# Patient Record
Sex: Male | Born: 1955 | Race: White | Hispanic: No | Marital: Married | Smoking: Never smoker
Health system: Southern US, Community
[De-identification: ages and names within clinical notes are randomized; demographics above are authoritative.]

## PROBLEM LIST (undated history)

## (undated) DIAGNOSIS — I890 Lymphedema, not elsewhere classified: Secondary | ICD-10-CM

## (undated) DIAGNOSIS — R112 Nausea with vomiting, unspecified: Secondary | ICD-10-CM

## (undated) DIAGNOSIS — C4442 Squamous cell carcinoma of skin of scalp and neck: Secondary | ICD-10-CM

## (undated) DIAGNOSIS — M1990 Unspecified osteoarthritis, unspecified site: Secondary | ICD-10-CM

## (undated) DIAGNOSIS — I509 Heart failure, unspecified: Secondary | ICD-10-CM

## (undated) DIAGNOSIS — K219 Gastro-esophageal reflux disease without esophagitis: Secondary | ICD-10-CM

## (undated) DIAGNOSIS — I700 Atherosclerosis of aorta: Secondary | ICD-10-CM

## (undated) DIAGNOSIS — I10 Essential (primary) hypertension: Secondary | ICD-10-CM

## (undated) DIAGNOSIS — N182 Chronic kidney disease, stage 2 (mild): Secondary | ICD-10-CM

## (undated) DIAGNOSIS — T8859XA Other complications of anesthesia, initial encounter: Secondary | ICD-10-CM

## (undated) DIAGNOSIS — J387 Other diseases of larynx: Secondary | ICD-10-CM

## (undated) DIAGNOSIS — Z9889 Other specified postprocedural states: Secondary | ICD-10-CM

## (undated) DIAGNOSIS — E119 Type 2 diabetes mellitus without complications: Secondary | ICD-10-CM

## (undated) DIAGNOSIS — N289 Disorder of kidney and ureter, unspecified: Secondary | ICD-10-CM

## (undated) HISTORY — PX: KIDNEY SURGERY: SHX687

## (undated) HISTORY — PX: LITHOTRIPSY: SUR834

## (undated) HISTORY — PX: TONSILLECTOMY: SUR1361

## (undated) HISTORY — PX: KNEE SURGERY: SHX244

---

## 1978-01-10 DIAGNOSIS — A159 Respiratory tuberculosis unspecified: Secondary | ICD-10-CM

## 1978-01-10 HISTORY — DX: Respiratory tuberculosis unspecified: A15.9

## 1997-11-30 ENCOUNTER — Emergency Department (HOSPITAL_COMMUNITY): Admission: EM | Admit: 1997-11-30 | Discharge: 1997-11-30 | Payer: Self-pay | Admitting: Emergency Medicine

## 1998-01-05 ENCOUNTER — Emergency Department (HOSPITAL_COMMUNITY): Admission: EM | Admit: 1998-01-05 | Discharge: 1998-01-05 | Payer: Self-pay | Admitting: Emergency Medicine

## 1998-01-05 ENCOUNTER — Encounter: Payer: Self-pay | Admitting: Emergency Medicine

## 1998-05-04 ENCOUNTER — Ambulatory Visit (HOSPITAL_COMMUNITY): Admission: RE | Admit: 1998-05-04 | Discharge: 1998-05-04 | Payer: Self-pay | Admitting: Specialist

## 1998-05-04 ENCOUNTER — Encounter: Payer: Self-pay | Admitting: Specialist

## 1998-09-17 ENCOUNTER — Ambulatory Visit (HOSPITAL_COMMUNITY): Admission: RE | Admit: 1998-09-17 | Discharge: 1998-09-17 | Payer: Self-pay | Admitting: Specialist

## 2010-03-02 ENCOUNTER — Other Ambulatory Visit: Payer: Self-pay | Admitting: Family Medicine

## 2010-03-02 ENCOUNTER — Ambulatory Visit
Admission: RE | Admit: 2010-03-02 | Discharge: 2010-03-02 | Disposition: A | Discharge status: Home / Self Care | Payer: BC Managed Care – PPO | Source: Ambulatory Visit | Attending: Family Medicine | Admitting: Family Medicine

## 2010-03-02 ENCOUNTER — Inpatient Hospital Stay (HOSPITAL_COMMUNITY)
Admission: EM | Admit: 2010-03-02 | Discharge: 2010-03-03 | DRG: 584 | Disposition: A | Discharge status: Still In Hospital | Payer: BC Managed Care – PPO | Attending: Internal Medicine | Admitting: Internal Medicine

## 2010-03-02 ENCOUNTER — Emergency Department (HOSPITAL_COMMUNITY): Payer: BC Managed Care – PPO

## 2010-03-02 DIAGNOSIS — E119 Type 2 diabetes mellitus without complications: Secondary | ICD-10-CM | POA: Diagnosis present

## 2010-03-02 DIAGNOSIS — N179 Acute kidney failure, unspecified: Secondary | ICD-10-CM | POA: Diagnosis present

## 2010-03-02 DIAGNOSIS — A419 Sepsis, unspecified organism: Principal | ICD-10-CM | POA: Diagnosis present

## 2010-03-02 DIAGNOSIS — N12 Tubulo-interstitial nephritis, not specified as acute or chronic: Secondary | ICD-10-CM | POA: Diagnosis present

## 2010-03-02 DIAGNOSIS — N1330 Unspecified hydronephrosis: Secondary | ICD-10-CM | POA: Diagnosis present

## 2010-03-02 MED ORDER — IOHEXOL 300 MG/ML  SOLN
125.0000 mL | Freq: Once | INTRAMUSCULAR | Status: AC | PRN
  Administered 2010-03-02: 125 mL via INTRAVENOUS

## 2010-03-03 ENCOUNTER — Inpatient Hospital Stay (HOSPITAL_COMMUNITY)
Admission: AD | Admit: 2010-03-03 | Discharge: 2010-03-07 | DRG: 584 | Disposition: A | Discharge status: Home / Self Care | Payer: BC Managed Care – PPO | Source: Other Acute Inpatient Hospital | Attending: Internal Medicine | Admitting: Internal Medicine

## 2010-03-03 DIAGNOSIS — R0902 Hypoxemia: Secondary | ICD-10-CM | POA: Diagnosis present

## 2010-03-03 DIAGNOSIS — N201 Calculus of ureter: Secondary | ICD-10-CM | POA: Diagnosis present

## 2010-03-03 DIAGNOSIS — A4151 Sepsis due to Escherichia coli [E. coli]: Principal | ICD-10-CM | POA: Diagnosis present

## 2010-03-03 DIAGNOSIS — IMO0001 Reserved for inherently not codable concepts without codable children: Secondary | ICD-10-CM | POA: Diagnosis present

## 2010-03-03 DIAGNOSIS — N10 Acute tubulo-interstitial nephritis: Secondary | ICD-10-CM | POA: Diagnosis present

## 2010-03-03 DIAGNOSIS — R7989 Other specified abnormal findings of blood chemistry: Secondary | ICD-10-CM | POA: Diagnosis present

## 2010-03-03 DIAGNOSIS — N179 Acute kidney failure, unspecified: Secondary | ICD-10-CM | POA: Diagnosis present

## 2010-03-03 DIAGNOSIS — D6959 Other secondary thrombocytopenia: Secondary | ICD-10-CM | POA: Diagnosis present

## 2010-03-03 DIAGNOSIS — A419 Sepsis, unspecified organism: Secondary | ICD-10-CM | POA: Diagnosis present

## 2010-03-03 DIAGNOSIS — I10 Essential (primary) hypertension: Secondary | ICD-10-CM | POA: Diagnosis present

## 2010-03-03 DIAGNOSIS — N1330 Unspecified hydronephrosis: Secondary | ICD-10-CM | POA: Diagnosis present

## 2010-03-03 LAB — CBC
HCT: 40.2 % (ref 39.0–52.0)
Hemoglobin: 14.6 g/dL (ref 13.0–17.0)
Hemoglobin: 13.6 g/dL (ref 13.0–17.0)
MCH: 30 pg (ref 26.0–34.0)
MCH: 30.4 pg (ref 26.0–34.0)
MCV: 88.5 fL (ref 78.0–100.0)
MCV: 89.9 fL (ref 78.0–100.0)
Platelets: 102 10*3/uL — ABNORMAL LOW (ref 150–400)
RBC: 4.86 MIL/uL (ref 4.22–5.81)
RBC: 4.47 MIL/uL (ref 4.22–5.81)
WBC: 3.9 10*3/uL — ABNORMAL LOW (ref 4.0–10.5)
WBC: 21.1 10*3/uL — ABNORMAL HIGH (ref 4.0–10.5)

## 2010-03-03 LAB — COMPREHENSIVE METABOLIC PANEL
ALT: 49 U/L (ref 0–53)
Alkaline Phosphatase: 69 U/L (ref 39–117)
CO2: 20 mEq/L (ref 19–32)
Chloride: 101 mEq/L (ref 96–112)
GFR calc non Af Amer: 40 mL/min — ABNORMAL LOW (ref >60)
Glucose, Bld: 239 mg/dL — ABNORMAL HIGH (ref 70–99)
Potassium: 3.9 mEq/L (ref 3.5–5.1)
Sodium: 134 mEq/L — ABNORMAL LOW (ref 135–145)
Total Bilirubin: 1.4 mg/dL — ABNORMAL HIGH (ref 0.3–1.2)

## 2010-03-03 LAB — URINALYSIS, ROUTINE W REFLEX MICROSCOPIC
Bilirubin Urine: NEGATIVE
Ketones, ur: 15 mg/dL — AB
Specific Gravity, Urine: 1.041 — ABNORMAL HIGH (ref 1.005–1.030)
Urine Glucose, Fasting: 100 mg/dL — AB
pH: 5.5 (ref 5.0–8.0)

## 2010-03-03 LAB — GLUCOSE, CAPILLARY
Glucose-Capillary: 193 mg/dL — ABNORMAL HIGH (ref 70–99)
Glucose-Capillary: 166 mg/dL — ABNORMAL HIGH (ref 70–99)
Glucose-Capillary: 192 mg/dL — ABNORMAL HIGH (ref 70–99)

## 2010-03-03 LAB — BASIC METABOLIC PANEL
BUN: 27 mg/dL — ABNORMAL HIGH (ref 6–23)
CO2: 19 mEq/L (ref 19–32)
Chloride: 102 mEq/L (ref 96–112)
Creatinine, Ser: 2.56 mg/dL — ABNORMAL HIGH (ref 0.4–1.5)
Glucose, Bld: 299 mg/dL — ABNORMAL HIGH (ref 70–99)
Potassium: 4.3 mEq/L (ref 3.5–5.1)

## 2010-03-03 LAB — DIFFERENTIAL
Basophils Relative: 0 % (ref 0–1)
Eosinophils Absolute: 0 10*3/uL (ref 0.0–0.7)
Eosinophils Relative: 0 % (ref 0–5)
Lymphocytes Relative: 4 % — ABNORMAL LOW (ref 12–46)
Lymphs Abs: 0.5 10*3/uL — ABNORMAL LOW (ref 0.7–4.0)
Monocytes Relative: 0 % — ABNORMAL LOW (ref 3–12)
Neutro Abs: 3.3 10*3/uL (ref 1.7–7.7)
Neutro Abs: 19 10*3/uL — ABNORMAL HIGH (ref 1.7–7.7)
Neutrophils Relative %: 85 % — ABNORMAL HIGH (ref 43–77)
Neutrophils Relative %: 90 % — ABNORMAL HIGH (ref 43–77)

## 2010-03-03 LAB — URINE MICROSCOPIC-ADD ON

## 2010-03-03 LAB — TYPE AND SCREEN: ABO/RH(D): B POS

## 2010-03-03 LAB — ABO/RH: ABO/RH(D): B POS

## 2010-03-04 LAB — URINE CULTURE: Culture  Setup Time: 201202210132

## 2010-03-04 LAB — GLUCOSE, CAPILLARY
Glucose-Capillary: 148 mg/dL — ABNORMAL HIGH (ref 70–99)
Glucose-Capillary: 221 mg/dL — ABNORMAL HIGH (ref 70–99)
Glucose-Capillary: 289 mg/dL — ABNORMAL HIGH (ref 70–99)

## 2010-03-04 LAB — DIFFERENTIAL
Basophils Relative: 0 % (ref 0–1)
Eosinophils Relative: 1 % (ref 0–5)
Monocytes Relative: 7 % (ref 3–12)
Neutrophils Relative %: 84 % — ABNORMAL HIGH (ref 43–77)
WBC Morphology: INCREASED

## 2010-03-04 LAB — COMPREHENSIVE METABOLIC PANEL
Alkaline Phosphatase: 59 U/L (ref 39–117)
BUN: 49 mg/dL — ABNORMAL HIGH (ref 6–23)
CO2: 24 mEq/L (ref 19–32)
Chloride: 99 mEq/L (ref 96–112)
Creatinine, Ser: 3.87 mg/dL — ABNORMAL HIGH (ref 0.4–1.5)
GFR calc non Af Amer: 16 mL/min — ABNORMAL LOW (ref >60)
Glucose, Bld: 304 mg/dL — ABNORMAL HIGH (ref 70–99)
Potassium: 4.4 mEq/L (ref 3.5–5.1)
Total Bilirubin: 2.4 mg/dL — ABNORMAL HIGH (ref 0.3–1.2)

## 2010-03-04 LAB — CBC
HCT: 36.2 % — ABNORMAL LOW (ref 39.0–52.0)
MCH: 30.1 pg (ref 26.0–34.0)
MCV: 92.3 fL (ref 78.0–100.0)
RBC: 3.92 MIL/uL — ABNORMAL LOW (ref 4.22–5.81)
WBC: 15.8 10*3/uL — ABNORMAL HIGH (ref 4.0–10.5)

## 2010-03-05 ENCOUNTER — Inpatient Hospital Stay (HOSPITAL_COMMUNITY): Payer: BC Managed Care – PPO

## 2010-03-05 LAB — COMPREHENSIVE METABOLIC PANEL
ALT: 44 U/L (ref 0–53)
AST: 39 U/L — ABNORMAL HIGH (ref 0–37)
Alkaline Phosphatase: 84 U/L (ref 39–117)
CO2: 23 mEq/L (ref 19–32)
Calcium: 7.6 mg/dL — ABNORMAL LOW (ref 8.4–10.5)
Chloride: 103 mEq/L (ref 96–112)
GFR calc Af Amer: 34 mL/min — ABNORMAL LOW (ref >60)
GFR calc non Af Amer: 28 mL/min — ABNORMAL LOW (ref >60)
Glucose, Bld: 244 mg/dL — ABNORMAL HIGH (ref 70–99)
Sodium: 135 mEq/L (ref 135–145)
Total Bilirubin: 1.8 mg/dL — ABNORMAL HIGH (ref 0.3–1.2)

## 2010-03-05 LAB — DIFFERENTIAL
Basophils Absolute: 0 10*3/uL (ref 0.0–0.1)
Basophils Relative: 0 % (ref 0–1)
Eosinophils Absolute: 0 10*3/uL (ref 0.0–0.7)
Lymphocytes Relative: 6 % — ABNORMAL LOW (ref 12–46)
Monocytes Relative: 6 % (ref 3–12)
Neutro Abs: 16.8 10*3/uL — ABNORMAL HIGH (ref 1.7–7.7)
Neutrophils Relative %: 88 % — ABNORMAL HIGH (ref 43–77)

## 2010-03-05 LAB — GLUCOSE, CAPILLARY
Glucose-Capillary: 244 mg/dL — ABNORMAL HIGH (ref 70–99)
Glucose-Capillary: 221 mg/dL — ABNORMAL HIGH (ref 70–99)
Glucose-Capillary: 209 mg/dL — ABNORMAL HIGH (ref 70–99)

## 2010-03-05 LAB — CBC
HCT: 37.1 % — ABNORMAL LOW (ref 39.0–52.0)
Hemoglobin: 12 g/dL — ABNORMAL LOW (ref 13.0–17.0)
MCHC: 32.3 g/dL (ref 30.0–36.0)
RBC: 4.01 MIL/uL — ABNORMAL LOW (ref 4.22–5.81)

## 2010-03-05 LAB — HEMOGLOBIN A1C
Hgb A1c MFr Bld: 8.6 % — ABNORMAL HIGH (ref <5.7)
Mean Plasma Glucose: 200 mg/dL — ABNORMAL HIGH (ref <117)

## 2010-03-06 ENCOUNTER — Inpatient Hospital Stay (HOSPITAL_COMMUNITY): Payer: BC Managed Care – PPO

## 2010-03-06 LAB — GLUCOSE, CAPILLARY
Glucose-Capillary: 206 mg/dL — ABNORMAL HIGH (ref 70–99)
Glucose-Capillary: 173 mg/dL — ABNORMAL HIGH (ref 70–99)
Glucose-Capillary: 178 mg/dL — ABNORMAL HIGH (ref 70–99)

## 2010-03-06 LAB — RENAL FUNCTION PANEL
BUN: 31 mg/dL — ABNORMAL HIGH (ref 6–23)
CO2: 24 mEq/L (ref 19–32)
Calcium: 8 mg/dL — ABNORMAL LOW (ref 8.4–10.5)
Chloride: 106 mEq/L (ref 96–112)
Creatinine, Ser: 1.26 mg/dL (ref 0.4–1.5)

## 2010-03-06 LAB — COMPREHENSIVE METABOLIC PANEL
ALT: 39 U/L (ref 0–53)
Albumin: 2.4 g/dL — ABNORMAL LOW (ref 3.5–5.2)
Alkaline Phosphatase: 96 U/L (ref 39–117)
Chloride: 105 mEq/L (ref 96–112)
Glucose, Bld: 228 mg/dL — ABNORMAL HIGH (ref 70–99)
Potassium: 4 mEq/L (ref 3.5–5.1)
Sodium: 137 mEq/L (ref 135–145)
Total Bilirubin: 1.3 mg/dL — ABNORMAL HIGH (ref 0.3–1.2)
Total Protein: 6.1 g/dL (ref 6.0–8.3)

## 2010-03-06 LAB — FERRITIN: Ferritin: 376 ng/mL — ABNORMAL HIGH (ref 22–322)

## 2010-03-06 LAB — CBC
HCT: 36.2 % — ABNORMAL LOW (ref 39.0–52.0)
Hemoglobin: 11.7 g/dL — ABNORMAL LOW (ref 13.0–17.0)
MCV: 91.9 fL (ref 78.0–100.0)
RBC: 3.94 MIL/uL — ABNORMAL LOW (ref 4.22–5.81)
RDW: 14 % (ref 11.5–15.5)
WBC: 16.2 10*3/uL — ABNORMAL HIGH (ref 4.0–10.5)

## 2010-03-06 LAB — MAGNESIUM: Magnesium: 2.6 mg/dL — ABNORMAL HIGH (ref 1.5–2.5)

## 2010-03-06 LAB — VITAMIN B12: Vitamin B-12: 1656 pg/mL — ABNORMAL HIGH (ref 211–911)

## 2010-03-06 LAB — FOLATE: Folate: >20 ng/mL

## 2010-03-07 ENCOUNTER — Inpatient Hospital Stay (HOSPITAL_COMMUNITY): Payer: BC Managed Care – PPO

## 2010-03-07 LAB — GLUCOSE, CAPILLARY
Glucose-Capillary: 201 mg/dL — ABNORMAL HIGH (ref 70–99)
Glucose-Capillary: 156 mg/dL — ABNORMAL HIGH (ref 70–99)

## 2010-03-07 LAB — COMPREHENSIVE METABOLIC PANEL
AST: 23 U/L (ref 0–37)
Albumin: 2.2 g/dL — ABNORMAL LOW (ref 3.5–5.2)
BUN: 22 mg/dL (ref 6–23)
Calcium: 8.4 mg/dL (ref 8.4–10.5)
Chloride: 104 mEq/L (ref 96–112)
Creatinine, Ser: 0.91 mg/dL (ref 0.4–1.5)
GFR calc Af Amer: >60 mL/min (ref >60)
Total Bilirubin: 1.1 mg/dL (ref 0.3–1.2)

## 2010-03-07 LAB — CBC
MCH: 29.9 pg (ref 26.0–34.0)
MCHC: 32.3 g/dL (ref 30.0–36.0)
MCV: 92.7 fL (ref 78.0–100.0)
Platelets: 133 10*3/uL — ABNORMAL LOW (ref 150–400)
RDW: 13.9 % (ref 11.5–15.5)

## 2010-03-07 LAB — PHOSPHORUS: Phosphorus: 2.4 mg/dL (ref 2.3–4.6)

## 2010-03-08 NOTE — Op Note (Signed)
  [name], Randall [name]              ACCOUNT NO.:  [id number]  MEDICAL RECORD NO.:  [id number]           PATIENT TYPE:  I  LOCATION:  1415                         FACILITY:  [facility]  PHYSICIAN:  Cerys Winget I. Olamide Carattini, M.D.DATE OF BIRTH:  [birth year]  DATE OF PROCEDURE: DATE OF DISCHARGE:                              OPERATIVE REPORT   PREOPERATIVE DIAGNOSIS:  Right ureteropelvic junction stone with obstruction and pyelonephritis.  POSTOPERATIVE DIAGNOSIS:  Right ureteropelvic junction stone with obstruction and pyelonephritis.  OPERATION:  Cystourethroscopy, right retrograde pyelogram with interpretation, right double-J stent.  SURGEON:  Nyiah Pianka I. Patsi Sears, M.D.  ANESTHESIA:  General LMA.  OPERATION:  After appropriate preanesthesia, the patient was brought to the operating room, placed on the operating room table in dorsal supine position where general LMA anesthesia was induced.  He was then re- placed in dorsal lithotomy position where the pubis was prepped with Betadine solution, draped in usual fashion.  BRIEF HISTORY:  This 55 year old male was admitted to [facility] Hospital[facility] and transferred to [facility] with pyelonephritis, right mild to moderate hydronephrosis with right 9 x 7 x 9 mm right UP junction stone with obstruction.  He also had acute renal failure.  He has been offered cystoscopy and double-J stent.  PROCEDURE:  Cystourethroscopy was accomplished and shows a normal- appearing bladder.  Right ureteral orifice identified, retrograde pyelogram was performed which shows a normal-appearing right ureter. The stone in the kidney was poorly identified on retrograde pyelography. Wire was coiled in the kidney, and a right 6-French x 24 cm double-J stent was placed without difficulty and in good position.  The patient was awakened, taken to recovery room in good condition.     [name] I. Patsi Sears,  M.D.     SIT/MEDQ  D:  03/03/2010  T:  03/04/2010  Job:  [id number]  Electronically Signed by Jethro Bolus M.D. on 03/08/2010 09:35:15 AM

## 2010-03-10 LAB — CULTURE, BLOOD (ROUTINE X 2)
Culture  Setup Time: 201202232152
Culture: NO GROWTH

## 2010-03-15 ENCOUNTER — Ambulatory Visit (HOSPITAL_COMMUNITY): Payer: BC Managed Care – PPO

## 2010-03-15 ENCOUNTER — Ambulatory Visit (HOSPITAL_COMMUNITY)
Admission: RE | Admit: 2010-03-15 | Discharge: 2010-03-15 | Disposition: A | Discharge status: Home / Self Care | Payer: BC Managed Care – PPO | Source: Ambulatory Visit | Attending: Urology | Admitting: Urology

## 2010-03-15 DIAGNOSIS — N1330 Unspecified hydronephrosis: Secondary | ICD-10-CM | POA: Insufficient documentation

## 2010-03-15 DIAGNOSIS — N200 Calculus of kidney: Secondary | ICD-10-CM | POA: Insufficient documentation

## 2010-03-15 NOTE — H&P (Signed)
Randall [name], Randall [name]              ACCOUNT NO.:  [id number]  MEDICAL RECORD NO.:  [id number]           PATIENT TYPE:  I  LOCATION:  1415                         FACILITY:  [facility]  PHYSICIAN:  Della Goo, M.D. DATE OF BIRTH:  12-25-1955  DATE OF ADMISSION:  03/03/2010 DATE OF DISCHARGE:  03/02/2010                             HISTORY & PHYSICAL   PRIMARY CARE PHYSICIAN:  Gloriajean Dell. Andrey Campanile, MD  CHIEF COMPLAINT:  Flank pain.  HISTORY OF PRESENT ILLNESS:  This is a 55 year old male who presents to the emergency department with complaints of severe right-sided flank pain, which started about 7:30 p.m.  He states he began to have fevers as well along with nausea and vomiting off and on.  The patient also states that he was passing tea-colored urine.  He denies having any dysuria.  The patient denies having any previous similar episodes to this.  The patient was seen by his primary care physician and was sent for an outpatient CT scan to evaluate for a possible kidney stone, results of which returned positive for a kidney stone.  The patient presented to the emergency department secondary to his diagnosis along with his increased pain.  PAST MEDICAL HISTORY:  Significant for hypertension.  PAST SURGICAL HISTORY:  History of bilateral knee arthroscopic surgeries.  MEDICATIONS:  Lisinopril, hydrochlorothiazide, promethazine VC and hydrocodone/APAP.  ALLERGIES:  NORGESIC.  SOCIAL HISTORY:  The patient is a nonsmoker.  He does report drinking one glass of wine every 2 weeks.  FAMILY HISTORY:  Negative for kidney stones, positive coronary artery disease in his mother, positive diabetes in his father.  No hypertension in his family, and his father had some type of cancer.  REVIEW OF SYSTEMS:  Pertinent as mentioned above.  PHYSICAL EXAMINATION FINDINGS:  GENERAL:  This is a 55 year old obese, well-developed Caucasian male who is in discomfort but no acute distress. VITAL  SIGNS:  Temperature 101.4, blood pressure 141/69, heart rate initially 133, now 114.  Respirations 24, now 20, O2 sats 92-96%. HEENT:  Normocephalic, atraumatic.  Pupils equally round, reactive to light.  Extraocular movements are intact.  Funduscopic benign.  There is no scleral icterus.  There is no conjunctival erythema.  Nares are patent bilaterally.  Oropharynx is clear. NECK:  Supple.  Full range of motion.  No thyromegaly, adenopathy or jugular venous distention.  CARDIOVASCULAR:  Tachycardic rate and rhythm.  No murmurs, gallops or rubs appreciated. LUNGS:  Clear to auscultation bilaterally.  No rales, rhonchi or wheezes. ABDOMEN:  Positive bowel sounds, soft, nontender, nondistended.  No hepatosplenomegaly. BACK:  Positive costovertebral angle tenderness on the right.  No spinous process tenderness.  No palpable muscular spasms. NEUROLOGIC:  Nonfocal.  LABORATORY STUDIES:  White blood cell count 3.9, hemoglobin 14.6, hematocrit 43.0, MCV 88.5, platelets 116, neutrophils 85%, lymphocytes 14%.  Sodium 134, potassium 3.9, chloride 101, CO2 of 20, BUN 19, creatinine 1.78 and glucose 239.  Urinalysis reveals large urine hemoglobin, large leukocyte esterase, urine total protein 100, urine microscopic rare epithelials, urine white blood cells 21-50, urine red blood cells 11-20, and many bacteria.  Chest x-ray reveals an elevated left hemidiaphragm,  some patchy segmental atelectasis in the left lung base.  ASSESSMENT:  This is a 55 year old male being admitted with: 1. Pyelonephritis. 2. Nephrolithiasis. 3. Right flank pain secondary to pyelonephritis and nephrolithiasis. 4. Febrile illness secondary to pyelonephritis. 5. Sinus tachycardia. 6. Hypertension.  PLAN:  The patient will be admitted.  He has been placed on antibiotic therapy of IV Rocephin at this time.  A urine culture and sensitivity has already been sent.  The patient will be placed on IV fluids and pain control  medication as needed.  And in the a.m., a Urology consultation will be placed.  The patient's regular medications will be further reviewed and reconciled and further workup we will ensue pending results, the patient's clinical course, and studies.     Della Goo, M.D.     HJ/MEDQ  D:  03/04/2010  T:  03/04/2010  Job:  [id number]  cc:   Gloriajean Dell. Andrey Campanile, M.D.  Electronically Signed by Della Goo M.D. on 03/15/2010 08:24:04 PM

## 2010-03-18 NOTE — H&P (Signed)
Randall [name], Randall [name]              ACCOUNT NO.:  [id number]  MEDICAL RECORD NO.:  [id number]           PATIENT TYPE:  I  LOCATION:  6713                         FACILITY:  [facility]  PHYSICIAN:  Brendia Sacks, MD    DATE OF BIRTH:  1955/04/02  DATE OF ADMISSION:  03/02/2010 DATE OF DISCHARGE:                             HISTORY & PHYSICAL   CHIEF COMPLAINT:  Fever.  HISTORY OF PRESENT ILLNESS:  This is a 55 year old man, who presented to the emergency room last evening with acute onset of right flank pain, fever, rigors, nausea, and vomiting.  He was found to be febrile and tachycardic in the emergency room.  CT scan obtained and did show a right UVJ stone with mild-to-moderate hydronephrosis.  Apparently Urology consultation was contemplated at that point, but not placed. The patient was evaluated by [facility] and admitted to the medical floor.  Orders are present.  The patient reports that his symptoms began acutely yesterday.  He has never had kidney stones before.  His symptoms are as described above. He had nausea and vomiting yesterday, he has none today and his pain is controlled today with morphine.  He feels overall better, but still does not feel his usual self.  REVIEW OF SYSTEMS:  Negative for changes to his vision, sore throat, rash, chest pain.  He did have some shortness of breath yesterday, but that has resolved.  No dysuria, no hematuria that he is aware of.  PAST MEDICAL HISTORY:  Hypertension.  PAST SURGICAL HISTORY:  Bilateral knee surgery.  ALLERGIES:  NORGESIC.  MEDICATIONS:  Include, 1. Multivitamin p.o. daily. 2. Hydrocodone/acetaminophen 5/500 p.o. q.6 h as needed. 3. Promethazine 25 mg every 6 hours as needed. 4. Lisinopril/hydrochlorothiazide 20/12.5 p.o. daily. 5. Glucosamine over-the-counter p.o. daily. 6. Aspirin 81 mg p.o. daily. 7. Fish oil p.o. daily.  FAMILY HISTORY:  Mother had heart disease.  Father had cancer  and diabetes.  SOCIAL HISTORY:  He is a nonsmoker, drinks minimal alcohol, perhaps a glass of wine every 2 weeks.  PHYSICAL EXAMINATION:  VITALS:  On the floor, temperature is 98.8, pulse 85, respirations 20, blood pressure 154/76, sat 96% on 2 liters. Initial vitals in the emergency room with temperature 101.4, blood pressure 141/69, pulse 133, respirations 24, sat 92%. GENERAL:  The patient appears ill, but nontoxic. HEENT:  Head appears normal.  Eyes, sclerae clear.  Pupils are equal, round, and reactive to light.  Lids, irises, and conjunctive appear normal.  ENT, hearing is grossly normal.  Lips, gums, and tongue appear unremarkable. NECK:  Supple.  No lymphadenopathy or masses.  No thyromegaly. CHEST:  Clear to auscultation bilaterally.  No wheezes, rales, or rhonchi.  There is normal respiratory effort. CARDIOVASCULAR:  Heart sounds are distant, but regular and tachycardic. No murmur, rub, or gallop.  No lower extremity edema. ABDOMEN:  Soft, nontender, nondistended.  He has been medicated and currently has no right flank pain to gentle palpation. SKIN:  Normal without rash or indurations and is nontender to palpation.  LABORATORY DATA:  Pertinent laboratory studies; white blood cell count now at 21.1 from 3.9 yesterday evening, platelet  count is 102.  Blood sugars ranging 200-300.  Basic metabolic panel also notable for creatinine now 2.56 in comparison to 1.78 yesterday.  Urinalysis is grossly positive with culture pending.  IMAGING:  Chest x-ray on March 03, 2010, atelectasis.  CT of the abdomen and pelvis, mild-to-moderate right hydronephrosis caused by a 9 x 7 x 9 mm right UPJ calculus.  Diffuse fatty infiltration of the liver.  ASSESSMENT/PLAN:  This is a 55 year old man presents with sepsis. 1. Sepsis secondary to pyelonephritis and right mild-to-moderate     hydronephrosis caused by ureteropelvic junction calculus.  We will     treat with IV fluids.  We will  treat with Rocephin.  He appears to     be clinically nontoxic at this point. 2. Pyelonephritis with right mild-to-moderate hydronephrosis secondary     to an ureteropelvic junction calculus.  I have discussed the case     with Dr. Patsi Sears, who will see the patient in consultation.  I     have made the patient n.p.o. and discontinued his pharmacologic VTE     prophylaxis. 3. Acute renal failure.  We will check a basic metabolic panel in the     morning and continue IV fluids at this point and hopefully removal     of stone will improve his renal function rapidly.  We will obtain a     renal ultrasound if his kidney function has not improved in the     morning.  His electrolyte panel appears to be stable at this point.     His acute renal failure is likely complicated by his diuretic and     ACE inhibitor therapy, which has been discontinued. 4. Elevated AST and total bilirubin.  Etiology unclear, but the     patient does have diffuse fatty infiltration on CT.  This is the     likely etiology. 5. Diabetes mellitus type 2.  The patient has definite hyperglycemia.     We will check hemoglobin A1c and place him on sliding scale     insulin.  I have discussed the case with the patient and his wife at bedside with Dr. Patsi Sears.  No H and P was available for review and I did personally reviewed the patient's studies, imaging, and discussed the above history with the patient.     Brendia Sacks, MD     DG/MEDQ  D:  03/03/2010  T:  03/03/2010  Job:  [id number]  Electronically Signed by Brendia Sacks  on 03/17/2010 09:13:43 PM

## 2010-03-24 NOTE — Consult Note (Signed)
NAMEJERRITT, CARDOZA              ACCOUNT NO.:  [id number]  MEDICAL RECORD NO.:  [id number]           PATIENT TYPE:  I  LOCATION:  1415                         FACILITY:  [facility]  PHYSICIAN:  Angeligue Bowne B. Eliott Nine, M.D.DATE OF BIRTH:  12-Mar-1955  DATE OF CONSULTATION: DATE OF DISCHARGE:  03/03/2010                                CONSULTATION   REASON FOR CONSULTATION:  Acute kidney injury.  HISTORY:  This is a 55 year old white male who has a background history of hypertension on lisinopril and diabetes type 2 that has, to date, not required any therapy.  He presented to his primary care office ([facility]) with flank pain, fevers, chills and nausea.  His primary physician is Dr. Andrey Campanile, who was not in at the time, and he was seen by one of Dr. Tawana Scale partners and sent to [facility] for a CT scan of the abdomen and pelvis.  By report, his creatinine at [facility] was 1.0.  He had a CT scanwith contrast that showed moderate right hydronephrosis with a right UVJ obstructing stone.  He and his wife went home and then were called back to come to the Emergency Department because of the CT findings.  He was complaining at that time of flank pain, fevers, chills and his wife reported that he had had some confusion and altered mental status.  His urinalysis showed bacteriuria and pyuria as well as hematuria. Creatinine in the Emergency Department on the same day was 1.78.  His urine culture subsequently grew greater than 100,000 Escherichia coli and he has been receiving IV fluids and Rocephin.  He was evaluated by Urology and subsequently transferred to [facility], where he underwent cystourethroscopy and right retrograde pyelography with placement of a double-J stent.  The operative report states that "the stone was poorly identified".  He received Toradol postprocedure intravenously for pain.  As previously mentioned by report, his  creatinine at [facility] prior to the CT on March 02, 2010, was 1.0, but there is no hard copy.  On March 02, 2010, in the [facility] ED several hours later, his creatinine was 1.78, on March 03, 2010, was 2.56 and today was up to 3.87.  His urine output today is recorded as 500 cc with a net input and output of 1690 in and 500 out.  His urine had been bloody prior to the urologic procedure and has been very dark, almost black in color, since.  His blood pressures have been soft around 90/60 and 100/68 despite fluids and holding his lisinopril.  He remains nauseous, but the chills and back pain have gotten better.  He did tolerate some food at lunchtime.  PAST MEDICAL HISTORY: 1. Hypertension, chronically on lisinopril/HCT. 2. Diabetes type 2, on no medications until this admission. 3. Remote history of arthroscopic surgery to both knees. 4. Fatty liver noted on CT scan on March 03, 2010.  MEDICATIONS PRIOR TO ADMISSION:  Include lisinopril/HCT, promethazine and hydrocodone APAP.  MEDICATIONS IN THE HOSPITAL:  Have included Rocephin 1 gram every 24 hours, Lovenox 40 mg a day, NovoLog insulin, Toradol IV every 6 hours (this  has been discontinued) and p.r.n. medicines which include Tylenol, Dilaudid, Percocet and Urised.  FAMILY HISTORY:  Positive for coronary disease and diabetes and his mother had Parkinson's disease.  Father died of cancer and there is a sibling with liver disease.  There is no history of stones or kidney disease in the family.  SOCIAL HISTORY:  [place].  He works in Administrator.  He occasionally drinks a glass of wine and does not use tobacco.  REVIEW OF SYSTEMS:  Currently positive for nausea and he states that if he lies very still he feels better.  The right side pain is persistent, but better.  His urine has been dark since his procedure and was tea- colored and bloody prior to  the procedure.  He is not having any dysuria.  He denies headache.  He had chills and fevers prior to admission with some disorientation at that time, but his temperature curve has come down and he has had no further episodes of this.  There have been no skin rashes.  He had never passed a stone prior to having this current stone identified.  PHYSICAL EXAMINATION:  VITAL SIGNS:  He is afebrile at this time.  Blood pressure is 100/68, O2 saturation is 94% on 2 liters. GENERAL APPEARANCE:  He is ill-appearing but very nice and soft-spoken. NECK:  He has no neck vein distention, although the neck veins are somewhat difficult to see because of his fairly large body habitus. RESPIRATORY:  His lungs are clear, but effort is diminished because when he takes a deep breath it hurts. CARDIAC:  Rhythm is regular.  S1-S2.  No audible S3.  No pericardial rub. ABDOMEN:  There is some tenderness in the right upper quadrant and right lower abdomen with some degree of flank tenderness with rocking.  The liver edge is at the right costal margin.  There are no masses.  The bladder is not palpable or percussible. EXTREMITIES:  Without edema.  LABORATORY DATA:  Sodium 131, potassium 4.4, chloride 94, CO2 24, BUN 49, creatinine 3.87, bilirubin 2.4, SGOT 45, SGPT 42, albumin 2.7, calcium 7.5.  Urinalysis on admission specific gravity 1.041 (done after the CT), large leukocyte esterase, 100 mg percent protein, 21-50 white cells and 11-20 red cells.  Urine culture is positive for greater than 100,000 colonies of Escherichia coli.  WBC is 15,800, down from 21,100. Hemoglobin 11.8, platelets 82,000, down from 116,000.  Hemoglobin A1c is pending.  IMPRESSION:  A 55 year old white male with history of hypertension and previously diet-controlled diabetes type 2 who presents with acute kidney injury secondary to a number of contributing factors including Escherichia coli pyelonephritis, an obstructing right  kidney stone, intravenous Toradol as well as preadmission chronic ACE inhibitor and nonsteroidal anti-inflammatory use, intravenous contrast with CT scan and relative hypotension.  At the present time his volume status, potassium and acid-base status are all okay and he is nonoliguric. However, his creatinine continues to rise.  RECOMMENDATIONS:  I agree with increasing his IV fluids to 150 cc an hour, discontinuation of Toradol and holding his lisinopril.  Would monitor intake and output carefully.  The patient is aware that he could possibly require dialysis, but hopefully with volume resuscitation and treatment of infection as well as elimination of these other nephrotoxic insults, creatinine will plateau.  Even though he does have a double-J stent in place with the stone that was poorly seen, would repeat an imaging study tomorrow (ultrasound  would be fine) to make sure that the hydronephrosis on the right has resolved.  We will follow up in the morning.  Thank you for asking [name] to see him.     [facility] Salvia Eliott Nine, M.D.     CBD/MEDQ  D:  03/04/2010  T:  03/05/2010  Job:  [id number]  cc:   Marcellus Scott, MD  Electronically Signed by Camille Bal M.D. on 03/24/2010 01:56:54 PM

## 2010-03-29 NOTE — Discharge Summary (Signed)
NAMEHEATON, SARIN              ACCOUNT NO.:  [id number]  MEDICAL RECORD NO.:  [id number]           PATIENT TYPE:  I  LOCATION:  1415                         FACILITY:  [facility]  PHYSICIAN:  Marcellus Scott, MD     DATE OF BIRTH:  04/25/1955  DATE OF ADMISSION:  03/03/2010 DATE OF DISCHARGE:  03/07/2010                              DISCHARGE SUMMARY   PRIMARY CARE PHYSICIAN:  Gloriajean Dell. Andrey Campanile, M.D.  UROLOGISTLynelle Smoke I. Patsi Sears, M.D.  DISCHARGE DIAGNOSES: 1. Acute pyelonephritis. 2. Right-sided hydronephrosis secondary to obstructing ureteropelvic     junction stone, status post double-J stent. 3. Sepsis secondary to acute pyelonephritis. 4. Acute renal failure, multifactorial, resolved. 5. Uncontrolled type 2 diabetes mellitus. 6. Thrombocytopenia, secondary to sepsis, improved. 7. Elevated liver function tests, secondary to sepsis, improved. 8. Hypertension. 9. Transient hypoxemia, resolved.  DISCHARGE MEDICATIONS: 1. Amlodipine 10 mg p.o. daily. 2. NovoLog 4 units subcutaneously t.i.d. with meals. 3. Lantus 15 units subcutaneously q.h.s. 4. Levaquin 750 mg p.o. daily. 5. Aspirin 81 mg p.o. daily. 6. Fish oil 1 tablet p.o. daily. 7. Hydrocodone/acetaminophen, 5/500 mg, 1 tablet p.o. q.6 hourly     p.r.n. for pain. 8. Multivitamins one p.o. daily. 9. Promethazine 25 mg p.o. q.6 hourly p.r.n. for nausea.  DISCONTINUED MEDICATIONS:  Glucosamine. Lisinopril/hydrochlorothiazide.  PROCEDURES:  Cystourethroscopy, right retrograde pyelogram with interpretation, right double-J stent.  IMAGING: 1. Chest x-ray, March 07, 2010, impression:  Interval development     of pleural effusions and pulmonary edema consistent with a history     of heart failure. 2. Renal ultrasound, impression:  Evaluation limited by the patient's     habitus, right-sided hydronephrosis appears to have cleared in the     interim. 3. Chest x-ray, February 22nd, impression:  Elevated left  hemidiaphragm with adjacent atelectasis. 4. CT of the abdomen and pelvis with contrast on February 21st,     impression:     a.     Mild-to-moderate right hydronephrosis caused by right      ureteropelvic junction calculus.     b.     No other renal calculi are seen.     c.     Diffuse fatty infiltration of the liver.     d.     The appendix and terminal ileum appear normal.  PERTINENT LABORATORY DATA:  Phosphorus is 2.4, magnesium 2.1. Comprehensive metabolic panel only pertinent for glucose 181 and albumin of 2.2, BUN is 22, creatinine 0.91.  CBC:  Hemoglobin 11.5, hematocrit 36, white blood cell 11.5, platelets 133.  Anemia panel shows vitamin B12 1656, serum folate greater than 20, ferritin 376.  Magnesium is 2.6. Blood cultures x2 from February 23rd are no growth to date.  Hemoglobin A1c 8.6.  Urine culture shows greater than 100,000 colonies per mL of E coli which is pansensitive.  Urinalysis on admission showed 21-50 white blood cells and many bacteria.  CONSULTATIONS: 1. Urology, Dr. Jethro Bolus 2. Nephrology, Dr. Camille Bal.  DIET:  Diabetic diet.  ACTIVITIES:  Increase activities slowly and then as tolerated.  COMPLAINTS TODAY:  None.  The patient has had a shower.  He denies any pain or dyspnea.  He is ambulated the hallways and is saturating in the 90s on room air.  He is passing flatus, but has not had a BM in the hospital.  PHYSICAL EXAMINATION:  GENERAL:  The patient is in no obvious distress. VITAL SIGNS:  Telemetry shows sinus rhythm in the 60s to 80s. Temperature 97.9 degrees Fahrenheit, pulse 79 per minute and regular, respiration 18 per minute, blood pressure 153/84 mmHg, and saturating at 93% on room air. RESPIRATORY SYSTEM:  Occasional basal crackles, but otherwise clear.  No increased work of breathing. CARDIOVASCULAR SYSTEM:  First and second heart sounds heard.  Regular rate and rhythm.  No JVD. ABDOMEN:  Obese, nontender, soft, and bowel  sounds present. CENTRAL NERVOUS SYSTEM:  The patient is awake, alert, oriented x3 with no focal neurological deficits. EXTREMITIES:  With no cyanosis, clubbing, or edema.  Grade 5/5 power.  HOSPITAL COURSE:  Mr. Huy is a 55 year old gentleman with history of hypertension, who was on an ACE inhibitor and diuretic, who presented with history of severe right-sided flank pain, fevers, nausea and vomiting, and passing tea-colored urine.  He came to the [facility] Emergency Room where a CT of the abdomen revealed right-sided hydronephrosis with a ureteropelvic junction calculus and evidence of pyelonephritis.  The patient was admitted and eventually transferred to the [facility] for further management.  1. Acute pyelonephritis in the context of an obstructed kidney.  The     patient was placed on IV Rocephin.  He will complete a total 10     days' course of antibiotics because he has had instrumentation     during this hospitalization. 2. Sepsis secondary to pyelonephritis.  Sepsis resolved. 3. Nonoliguric acute renal failure.  This is multifactorial secondary     to dehydration, urinary tract infection, hydronephrosis, contrast,     ACE inhibitor, and IV Toradol.  His renal ultrasound was repeated     and the hydronephrosis had resolved post stenting.  He was hydrated     with IV fluids and all the culprit medications were discontinued.     His renal functions have normalized.  The nephrologists have     recommended not starting his ACE inhibitors or ARBs until his acute     phase has resolved.  They will see the patient in their office in     followup. 4. Left hydronephrosis.  The patient is status post left double-J     stent by Urology.  Urology will see him in their offices for     possible lithotripsy. 5. Poorly controlled type 2 diabetes mellitus.  His blood sugars     currently range in the 156 to 201 range.  He will go home on the     insulins.  He is to follow up  with his MD in the next few days for     adjusting his insulin regimen and his blood pressure medications. 6. Transient hypoxemia especially when he was lying down secondary to     his obesity and possibly from a small amount of fluid from IV fluid     resuscitation.  Currently, he is asymptomatic and he is saturating     in the 90s on room air.  He should self diurese and recover fully     from this. 7. Thrombocytopenia, possibly from sepsis, stable.  Recommend followup     CBCs as an outpatient. 8. Anemia, stable.  Follow up as an outpatient.  DISPOSITION:  The patient is discharged home in stable condition.  FOLLOWUP RECOMMENDATIONS: 1. With Dr. Jethro Bolus or [facility].  The patient is to call     for an appointment. 2. Dr. Benedetto Goad.  The patient is to call for an appointment to be     seen in 4-5 days with blood tests that is CBC and basic metabolic     panel. 3. With Dr. Camille Bal.  MD's office will call with an     appointment.  Time taken in coordinating this discharge is 45 minutes.     Marcellus Scott, MD     AH/MEDQ  D:  03/07/2010  T:  03/07/2010  Job:  [id number]  cc:   Duke Salvia. Eliott Nine, M.D. Fax: [phone]  Sigmund I. Patsi Sears, M.D. Fax: [phone]  Gloriajean Dell. Andrey Campanile, M.D. Fax: [phone] Signed by Marcellus Scott MD on 03/29/2010 01:04:56 AM

## 2010-03-29 NOTE — Consult Note (Signed)
  NAMEAKIO, HUDNALL              ACCOUNT NO.:  [id number]  MEDICAL RECORD NO.:  [id number]           PATIENT TYPE:  I  LOCATION:  6713                         FACILITY:  [facility]  PHYSICIAN:  Shayleigh Bouldin I. Patsi Sears, M.D.DATE OF BIRTH:  Dec 09, 1955  DATE OF CONSULTATION: DATE OF DISCHARGE:  03/03/2010                                CONSULTATION   PHYSICIAN REQUESTING CONSULT:  Brendia Sacks, MD  HISTORY OF PRESENT ILLNESS:  This is a pleasant 55 year old obese white male who was brought to [facility] Emergency Department on March 02, 2010, with complaints of sudden onset of right flank pain earlier in the day followed by fever and shaking chills.  He had a CT urogram performed as an outpatient prior to this admission, which demonstrated a 9-mm right ureteropelvic junction stone causing obstruction with moderate hydroureteronephrosis noted.  No other urolithiasis were identified. White blood count on admission was 21,000 and urine appeared grossly infected.  Urine culture and sensitivity remains pending.  The patient was started on 1 g IV Rocephin daily for suspected urosepsis.  White blood cell count has responded nicely at 3900 on repeat today. Creatinine on admission was noted elevated at 2.56 and repeat today is decreased down to 1.78.  The patient continues with significant right flank pain, but appears to be well controlled with pain meds.  He has no prior history of nephrolithiasis.  No prior urologic history.  PAST MEDICAL HISTORY:  Significant for hypertension and borderline diabetes.  PAST SURGICAL HISTORY:  The patient has had bilateral knee arthroscopy and an ACL repair.  ALLERGIES:  The patient is allergic to NORGESIC.  HOME MEDICATIONS:  The patient takes lisinopril 20 mg daily.  PHYSICAL EXAMINATION:  CONSTITUTIONAL:  The patient is alert and oriented x3, in no acute distress.  He is well developed and well nourished. VITAL SIGNS:  Stable, and the patient is  afebrile. ABDOMEN:  Obese, nondistended, nontender, normal bowel sounds. EXTREMITIES:  No edema.  ASSESSMENT: 1. Obstructing right ureteropelvic junction stone. 2. Urosepsis secondary to obstructing right ureteropelvic junction     stone.  PLAN:  We will transport the patient via [facility] over to [facility] and transferred care to the hospitalist at [facility] who will be admitting.  He will undergo cystoscopy with right retrograde pyelogram, attempted stone dislodgement and insertion of a right double- J stent tonight by Dr. Patsi Sears.  We will continue IV antibiotics. Plan will be for outpatient right ESL at later date to be determined.    ______________________________ Marcello Fennel, PA   ______________________________ Vonzell Schlatter. Patsi Sears, M.D.    AB/MEDQ  D:  03/03/2010  T:  03/04/2010  Job:  [id number]  Electronically Signed by Marcello Fennel PA on 03/25/2010 04:18:46 PM Electronically Signed by Jethro Bolus M.D. on 03/29/2010 12:34:50 PM

## 2012-01-11 DIAGNOSIS — Z87442 Personal history of urinary calculi: Secondary | ICD-10-CM

## 2012-01-11 HISTORY — DX: Personal history of urinary calculi: Z87.442

## 2015-06-17 DIAGNOSIS — N200 Calculus of kidney: Secondary | ICD-10-CM

## 2015-06-17 HISTORY — DX: Calculus of kidney: N20.0

## 2018-08-18 ENCOUNTER — Emergency Department (HOSPITAL_COMMUNITY)
Admission: EM | Admit: 2018-08-18 | Discharge: 2018-08-18 | Disposition: A | Discharge status: Home / Self Care | Payer: BC Managed Care – PPO | Attending: Emergency Medicine | Admitting: Emergency Medicine

## 2018-08-18 ENCOUNTER — Other Ambulatory Visit: Payer: Self-pay

## 2018-08-18 ENCOUNTER — Emergency Department (HOSPITAL_COMMUNITY): Payer: BC Managed Care – PPO

## 2018-08-18 ENCOUNTER — Encounter (HOSPITAL_COMMUNITY): Payer: Self-pay

## 2018-08-18 DIAGNOSIS — Z23 Encounter for immunization: Secondary | ICD-10-CM | POA: Insufficient documentation

## 2018-08-18 DIAGNOSIS — R202 Paresthesia of skin: Secondary | ICD-10-CM | POA: Insufficient documentation

## 2018-08-18 DIAGNOSIS — M25531 Pain in right wrist: Secondary | ICD-10-CM | POA: Insufficient documentation

## 2018-08-18 DIAGNOSIS — E119 Type 2 diabetes mellitus without complications: Secondary | ICD-10-CM | POA: Diagnosis not present

## 2018-08-18 HISTORY — DX: Disorder of kidney and ureter, unspecified: N28.9

## 2018-08-18 HISTORY — DX: Type 2 diabetes mellitus without complications: E11.9

## 2018-08-18 MED ORDER — MELOXICAM 15 MG PO TABS
15.0000 mg | ORAL_TABLET | Freq: Once | ORAL | Status: AC
  Administered 2018-08-18: 15 mg via ORAL
  Filled 2018-08-18: qty 1

## 2018-08-18 MED ORDER — ACETAMINOPHEN 500 MG PO TABS
1000.0000 mg | ORAL_TABLET | Freq: Once | ORAL | Status: AC
  Administered 2018-08-18: 1000 mg via ORAL
  Filled 2018-08-18: qty 2

## 2018-08-18 MED ORDER — OXYCODONE HCL 5 MG PO TABS
5.0000 mg | ORAL_TABLET | Freq: Once | ORAL | Status: AC
  Administered 2018-08-18: 5 mg via ORAL
  Filled 2018-08-18: qty 1

## 2018-08-18 MED ORDER — TETANUS-DIPHTH-ACELL PERTUSSIS 5-2.5-18.5 LF-MCG/0.5 IM SUSP
0.5000 mL | Freq: Once | INTRAMUSCULAR | Status: AC
  Administered 2018-08-18: 0.5 mL via INTRAMUSCULAR
  Filled 2018-08-18: qty 0.5

## 2018-08-18 NOTE — ED Triage Notes (Signed)
Pt presents with c/o right arm injury. Pt reports he was using a tool to cut a tree and when applying force to the tree, the branch and the saw came through the air and hit his arm. Pt has some cuts to the area, bleeding controlled, and swelling as well to the wrist and the forearm.

## 2018-08-18 NOTE — ED Provider Notes (Signed)
[facility] DEPT Provider Note   CSN: [id number] Arrival date & time: 08/18/18  1652    History   Chief Complaint Chief Complaint  Patient presents with  . Arm Pain    HPI Randall [name] is a 63 y.o. male presents to the ER for evaluation of right wrist pain.  Onset around 7:30 AM today while he was working to cut down a tree.  Patient states the saw was trying to cut through a branch and unfortunately dislodged and hit him on the posterior aspect of his right wrist.  He had severe, sudden pain.  He had associated abrasions to the skin.  States he washed the skin, applied an ice pack and wrapped it with an Ace bandage and continue to work.  States since using the eyes the swelling has significantly improved however when he finished work he had persistent pain and intermittent tingling to the index finger.  He states something hit him on his head but he was wearing a helmet and denies any significant injury to the head from this.  He denies any previous history of injuries, surgeries to the right wrist.  He denies any loss of sensation to the hand or fingertips.  States the pain makes his hand and fingers feel weak because it hurts to move them.  Aggravated by any movement of the wrist, palpation.  Slightly alleviated with the ice, Ace bandage and rest.  He is left-hand dominant.     HPI  Past Medical History:  Diagnosis Date  . Diabetes mellitus without complication ([place])   . Renal disorder     There are no active problems to display for this patient.   Past Surgical History:  Procedure Laterality Date  . KIDNEY SURGERY    . KNEE SURGERY    . LITHOTRIPSY          Home Medications    Prior to Admission medications   Not on File    Family History No family history on file.  Social History Social History   Tobacco Use  . Smoking status: Never Smoker  . Smokeless tobacco: Never Used  Substance Use Topics  . Alcohol use: Never   Frequency: Never  . Drug use: Never     Allergies   Norgesic forte [orphenadrine-aspirin-caffeine]   Review of Systems Review of Systems  Musculoskeletal: Positive for arthralgias and joint swelling.  Skin: Positive for wound.  Neurological:       Tingling   All other systems reviewed and are negative.    Physical Exam Updated Vital Signs BP (!) 171/87   Pulse 85   Temp 98.4 F (36.9 C) (Oral)   Resp 18   Ht 5\' 6"  (1.676 m)   Wt 109.3 kg   SpO2 100%   BMI 38.90 kg/m   Physical Exam Vitals signs and nursing note reviewed. Exam conducted with a chaperone present.  Constitutional:      Appearance: He is well-developed.  HENT:     Head: Normocephalic.     Nose: Nose normal.  Eyes:     General: Lids are normal.  Neck:     Musculoskeletal: Normal range of motion.  Cardiovascular:     Rate and Rhythm: Normal rate.     Comments: 1+ radial pulses bilaterally Pulmonary:     Effort: Pulmonary effort is normal. No respiratory distress.  Musculoskeletal: Normal range of motion.        General: Tenderness and deformity present.  Comments: Right wrist: Tenderness along the ventral/dorsal aspect of the wrist most significant at the radial aspect dorsally.  Subtle focal edema versus deformity on this area.  Range of motion of the wrist deferred due to pain.  No focal bony tenderness to the scaphoid.  Negative scaphoid compression test.  No tenderness to the metacarpals, MCPs, fingers.  Full range of motion of the fingers causes pain in the wrist.  Full opposition of the thumb to all fingers.  Full supination of the forearm, pain in the wrist. No tenderness to the middle/proximal forearm.  Skin:    Findings: Bruising present.     Comments: Subtle green ecchymosis to dorsum of right wrist.  Superficial abrasion to right dorsal wrist, skin otherwise intact   Neurological:     Mental Status: He is alert.     Comments: Sensation to sharp versus dull, two-point discrimination  intact in all the fingertips of the right hand.  Strength against resistance with flexion/extension of the fingers intact.  Psychiatric:        Behavior: Behavior normal.      ED Treatments / Results  Labs (all labs ordered are listed, but only abnormal results are displayed) Labs Reviewed - No data to display  EKG None  Radiology Dg Wrist Complete Right  Result Date: 08/18/2018 CLINICAL DATA:  Injury with deformity. EXAM: RIGHT WRIST - COMPLETE 3+ VIEW COMPARISON:  None. FINDINGS: There is no evidence of fracture or dislocation. There is no evidence of arthropathy or other focal bone abnormality. Soft tissues are unremarkable. IMPRESSION: Negative. Electronically Signed   By: Katherine Mantlehristopher  Green M.D.   On: 08/18/2018 18:15    Procedures Procedures (including critical care time)  Medications Ordered in ED Medications  acetaminophen (TYLENOL) tablet 1,000 mg (1,000 mg Oral Given 08/18/18 1809)  meloxicam (MOBIC) tablet 15 mg (15 mg Oral Given 08/18/18 1820)  Tdap (BOOSTRIX) injection 0.5 mL (0.5 mLs Intramuscular Given 08/18/18 1809)  oxyCODONE (Oxy IR/ROXICODONE) immediate release tablet 5 mg (5 mg Oral Given 08/18/18 1925)     Initial Impression / Assessment and Plan / ED Course  I have reviewed the triage vital signs and the nursing notes.  Pertinent labs & imaging results that were available during my care of the patient were reviewed by me and considered in my medical decision making (see chart for details).  Traumatic right wrist pain.  Suspect soft tissue injury VS fracture.  X-rays obtained and reviewed by me and radiology negative for acute bony abnormality, dislocation.  Exam reveals ecchymosis to the dorsal aspect of the wrist and diffuse tenderness however no scaphoid tenderness.  Negative scaphoid compression test.  Full range of motion of the thumb without any pain.  I have low suspicion for scaphoid fracture.  Repeat exam and patient has full range of motion of the wrist  without significant pain.  Extremities neurovascularly intact.  He denies any other significant injuries from the incident.  Will DC with Velcro wrist splint, NSAIDs, ice, elevation, early range of motion exercises and strengthening exercises.  Discussed importance of hand surgery follow-up and 7 to 10 days if symptoms are not improving.  Return precautions given.  Patient is comfortable with this.  Final Clinical Impressions(s) / ED Diagnoses   Final diagnoses:  Right wrist pain    ED Discharge Orders    None       Jerrell MylarGibbons, Brittanny Levenhagen J, PA-C 08/18/18 1930    Tilden Fossaees, Elizabeth, MD 08/19/18 [phone]

## 2018-08-18 NOTE — Discharge Instructions (Signed)
You were seen in the ER for right wrist pain.  X-rays today did not show any fractures, dislocations.  I suspect your symptoms are due to a soft tissue injury, bruise.  Treatment will include anti-inflammatory medicines, ice, rest, immobilization and compression.  Take 500 to 1000 mg of acetaminophen every 6-8 hours for the next 72 hours to help with inflammation, pain, swelling.  You can take your daily meloxicam dose as well.  Elevate your wrist.  Ice at least 3 times daily for a total of 20 to 30 minutes each time.  Wear your wrist brace for at minimum 72 hours to help with compression, immobilization and pain.  After the 72-hour acute phase of injury you can start cutting back on the acetaminophen, meloxicam.  You can remove your Velcro brace and start doing light range of motion exercises as we discussed.  Pain should improve slowly over the next week.  Follow-up with hand specialist in the next 7 to 14 days if your symptoms are lingering, do not resolve  Return to the ER for loss of sensation, coolness or discoloration to the fingertips

## 2018-08-22 ENCOUNTER — Other Ambulatory Visit: Payer: Self-pay

## 2018-08-22 ENCOUNTER — Other Ambulatory Visit: Payer: Self-pay | Admitting: Family Medicine

## 2018-08-22 ENCOUNTER — Ambulatory Visit
Admission: RE | Admit: 2018-08-22 | Discharge: 2018-08-22 | Disposition: A | Discharge status: Home / Self Care | Payer: BC Managed Care – PPO | Source: Ambulatory Visit | Attending: Family Medicine | Admitting: Family Medicine

## 2018-08-22 DIAGNOSIS — M25551 Pain in right hip: Secondary | ICD-10-CM

## 2020-03-21 DIAGNOSIS — E119 Type 2 diabetes mellitus without complications: Secondary | ICD-10-CM | POA: Insufficient documentation

## 2020-03-21 DIAGNOSIS — E118 Type 2 diabetes mellitus with unspecified complications: Secondary | ICD-10-CM | POA: Insufficient documentation

## 2020-03-21 HISTORY — DX: Type 2 diabetes mellitus without complications: E11.9

## 2020-09-21 ENCOUNTER — Other Ambulatory Visit: Payer: Self-pay

## 2020-09-21 ENCOUNTER — Ambulatory Visit: Payer: Self-pay

## 2020-09-21 ENCOUNTER — Encounter: Payer: Self-pay | Admitting: Orthopedic Surgery

## 2020-09-21 ENCOUNTER — Telehealth: Payer: Self-pay

## 2020-09-21 ENCOUNTER — Ambulatory Visit (INDEPENDENT_AMBULATORY_CARE_PROVIDER_SITE_OTHER): Payer: BC Managed Care – PPO | Admitting: Orthopedic Surgery

## 2020-09-21 ENCOUNTER — Other Ambulatory Visit: Payer: Self-pay | Admitting: Physician Assistant

## 2020-09-21 DIAGNOSIS — M25561 Pain in right knee: Secondary | ICD-10-CM | POA: Diagnosis not present

## 2020-09-21 DIAGNOSIS — S76111A Strain of right quadriceps muscle, fascia and tendon, initial encounter: Secondary | ICD-10-CM | POA: Diagnosis not present

## 2020-09-21 HISTORY — DX: Strain of right quadriceps muscle, fascia and tendon, initial encounter: S76.111A

## 2020-09-21 MED ORDER — OXYCODONE-ACETAMINOPHEN 5-325 MG PO TABS: 1.0000 | ORAL_TABLET | ORAL | 0 refills | Status: DC | PRN

## 2020-09-21 NOTE — Progress Notes (Signed)
Office Visit Note   Patient: Randall [name]           Date of Birth: 09/11/1955           MRN: [id number] Visit Date: 09/21/2020              Requested by: No referring provider defined for this encounter. PCP: Patient, No Pcp Per (Inactive)  Chief Complaint  Patient presents with   Right Knee - Pain    DOI 09/21/20 slipped and fell in mud this morning. Twist and give way injury acute onset of pain       HPI: Patient is a 65 year old gentleman who states that he was gardening his foot slipped and he had an acute quadriceps contracture with his leg giving out unable to extend his leg or weight-bear after the injury.  Assessment & Plan: Visit Diagnoses:  1. Acute pain of right knee   2. Rupture of right quadriceps tendon, initial encounter     Plan: We will schedule patient for quad tendon reconstruction this week.  Patient does have uncontrolled hypertension and has an appointment with his medical doctor to evaluate the hypertension on Wednesday discussed the importance of getting this under control prior to surgery.  Follow-Up Instructions: No follow-ups on file.   Ortho Exam  Patient is alert, oriented, no adenopathy, well-dressed, normal affect, normal respiratory effort. Examination patient cannot actively extend the right knee.  There is a palpable defect of approximately 3 cm of the quad tendon at the proximal pole the patella there is no skin breakdown no ulcerations the knee joint is congruent.  The patella is midline.  Imaging: XR Knee 1-2 Views Right  Result Date: 09/21/2020 2 view radiographs of the right knee shows arthritis with medial joint line narrowing and periarticular bony spurs patient does have an avulsion fleck off the superior pole of the patella consistent with quad tendon rupture.  No images are attached to the encounter.  Labs: Lab Results  Component Value Date   HGBA1C (H) 03/04/2010    8.6 (NOTE)                                                                        According to the ADA Clinical Practice Recommendations for 2011, when HbA1c is used as a screening test:   >=6.5%   Diagnostic of Diabetes Mellitus           (if abnormal result  is confirmed)  5.7-6.4%   Increased risk of developing Diabetes Mellitus  References:Diagnosis and Classification of Diabetes Mellitus,Diabetes Care,2011,34(Suppl 1):S62-S69 and Standards of Medical Care in         Diabetes - 2011,Diabetes Care,2011,34  (Suppl 1):S11-S61.   REPTSTATUS 03/10/2010 FINAL 03/04/2010   CULT NO GROWTH 5 DAYS 03/04/2010   LABORGA ESCHERICHIA COLI 03/02/2010     Lab Results  Component Value Date   ALBUMIN 2.2 (L) 03/07/2010   ALBUMIN 2.4 (L) 03/06/2010   ALBUMIN 2.3 (L) 03/06/2010    Lab Results  Component Value Date   MG 2.1 03/07/2010   MG 2.6 (H) 03/06/2010   No results found for: VD25OH  No results found for: PREALBUMIN CBC EXTENDED Latest Ref Rng & Units 03/07/2010 03/06/2010 03/05/2010  WBC 4.0 - 10.5 K/uL 11.5(H) 16.2(H) 19.2(H)  RBC 4.22 - 5.81 MIL/uL 3.84(L) 3.94(L) 4.01(L)  HGB 13.0 - 17.0 g/dL 11.5(L) 11.7(L) 12.0(L)  HCT 39.0 - 52.0 % 35.6(L) 36.2(L) 37.1(L)  PLT 150 - 400 K/uL 133(L) 115(L) 103(L)  NEUTROABS 1.7 - 7.7 K/uL - - 16.8(H)  LYMPHSABS 0.7 - 4.0 K/uL - - 1.2     There is no height or weight on file to calculate BMI.  Orders:  Orders Placed This Encounter  Procedures   XR Knee 1-2 Views Right   No orders of the defined types were placed in this encounter.    Procedures: No procedures performed  Clinical Data: No additional findings.  ROS:  All other systems negative, except as noted in the HPI. Review of Systems  Objective: Vital Signs: There were no vitals taken for this visit.  Specialty Comments:  No specialty comments available.  PMFS History: There are no problems to display for this patient.  Past Medical History:  Diagnosis Date   Diabetes mellitus without complication ([facility])    Renal disorder      History reviewed. No pertinent family history.  Past Surgical History:  Procedure Laterality Date   KIDNEY SURGERY     KNEE SURGERY     LITHOTRIPSY     Social History   Occupational History   Not on file  Tobacco Use   Smoking status: Never   Smokeless tobacco: Never  Substance and Sexual Activity   Alcohol use: Never   Drug use: Never   Sexual activity: Not on file

## 2020-09-21 NOTE — Telephone Encounter (Signed)
Called and left a VM for [name] to [facility] concerning patient.

## 2020-09-22 ENCOUNTER — Encounter (HOSPITAL_COMMUNITY): Payer: Self-pay | Admitting: Orthopedic Surgery

## 2020-09-22 ENCOUNTER — Other Ambulatory Visit: Payer: Self-pay

## 2020-09-22 NOTE — Progress Notes (Signed)
Spoke with pt instructed not to take metformin or Januvia day of surgery. Pt given pre-op instructions.

## 2020-09-25 ENCOUNTER — Ambulatory Visit (HOSPITAL_COMMUNITY): Payer: BC Managed Care – PPO | Admitting: Anesthesiology

## 2020-09-25 ENCOUNTER — Encounter (HOSPITAL_COMMUNITY)
Admission: RE | Disposition: A | Discharge status: Home / Self Care | Payer: Self-pay | Source: Home / Self Care | Attending: Orthopedic Surgery

## 2020-09-25 ENCOUNTER — Other Ambulatory Visit: Payer: Self-pay

## 2020-09-25 ENCOUNTER — Encounter (HOSPITAL_COMMUNITY): Payer: Self-pay | Admitting: Orthopedic Surgery

## 2020-09-25 ENCOUNTER — Ambulatory Visit (HOSPITAL_COMMUNITY)
Admission: RE | Admit: 2020-09-25 | Discharge: 2020-09-25 | Disposition: A | Discharge status: Home / Self Care | Payer: BC Managed Care – PPO | Attending: Orthopedic Surgery | Admitting: Orthopedic Surgery

## 2020-09-25 DIAGNOSIS — E119 Type 2 diabetes mellitus without complications: Secondary | ICD-10-CM | POA: Diagnosis not present

## 2020-09-25 DIAGNOSIS — S76111S Strain of right quadriceps muscle, fascia and tendon, sequela: Secondary | ICD-10-CM | POA: Diagnosis not present

## 2020-09-25 DIAGNOSIS — Y93H2 Activity, gardening and landscaping: Secondary | ICD-10-CM | POA: Insufficient documentation

## 2020-09-25 DIAGNOSIS — Z886 Allergy status to analgesic agent status: Secondary | ICD-10-CM | POA: Diagnosis not present

## 2020-09-25 DIAGNOSIS — S76111A Strain of right quadriceps muscle, fascia and tendon, initial encounter: Secondary | ICD-10-CM | POA: Insufficient documentation

## 2020-09-25 DIAGNOSIS — Z91040 Latex allergy status: Secondary | ICD-10-CM | POA: Diagnosis not present

## 2020-09-25 DIAGNOSIS — W010XXA Fall on same level from slipping, tripping and stumbling without subsequent striking against object, initial encounter: Secondary | ICD-10-CM | POA: Diagnosis not present

## 2020-09-25 DIAGNOSIS — I10 Essential (primary) hypertension: Secondary | ICD-10-CM | POA: Diagnosis not present

## 2020-09-25 DIAGNOSIS — M79651 Pain in right thigh: Secondary | ICD-10-CM

## 2020-09-25 HISTORY — DX: Other specified postprocedural states: Z98.890

## 2020-09-25 HISTORY — DX: Essential (primary) hypertension: I10

## 2020-09-25 HISTORY — DX: Other complications of anesthesia, initial encounter: T88.59XA

## 2020-09-25 HISTORY — DX: Gastro-esophageal reflux disease without esophagitis: K21.9

## 2020-09-25 HISTORY — PX: QUADRICEPS TENDON REPAIR: SHX756

## 2020-09-25 HISTORY — DX: Other specified postprocedural states: R11.2

## 2020-09-25 LAB — CBC
HCT: 38.6 % — ABNORMAL LOW (ref 39.0–52.0)
Hemoglobin: 12.6 g/dL — ABNORMAL LOW (ref 13.0–17.0)
MCH: 30.7 pg (ref 26.0–34.0)
MCHC: 32.6 g/dL (ref 30.0–36.0)
MCV: 94.1 fL (ref 80.0–100.0)
Platelets: 168 10*3/uL (ref 150–400)
RBC: 4.1 MIL/uL — ABNORMAL LOW (ref 4.22–5.81)
RDW: 13 % (ref 11.5–15.5)
WBC: 8.3 10*3/uL (ref 4.0–10.5)
nRBC: 0 % (ref 0.0–0.2)

## 2020-09-25 LAB — BASIC METABOLIC PANEL
Anion gap: 10 (ref 5–15)
BUN: 20 mg/dL (ref 8–23)
CO2: 23 mmol/L (ref 22–32)
Calcium: 8.6 mg/dL — ABNORMAL LOW (ref 8.9–10.3)
Chloride: 101 mmol/L (ref 98–111)
Creatinine, Ser: 0.68 mg/dL (ref 0.61–1.24)
GFR, Estimated: >60 mL/min (ref >60)
Glucose, Bld: 241 mg/dL — ABNORMAL HIGH (ref 70–99)
Potassium: 4.3 mmol/L (ref 3.5–5.1)
Sodium: 134 mmol/L — ABNORMAL LOW (ref 135–145)

## 2020-09-25 LAB — GLUCOSE, CAPILLARY
Glucose-Capillary: 267 mg/dL — ABNORMAL HIGH (ref 70–99)
Glucose-Capillary: 241 mg/dL — ABNORMAL HIGH (ref 70–99)
Glucose-Capillary: 241 mg/dL — ABNORMAL HIGH (ref 70–99)

## 2020-09-25 SURGERY — REPAIR, TENDON, QUADRICEPS
Anesthesia: General | Laterality: Right

## 2020-09-25 MED ORDER — DEXAMETHASONE SODIUM PHOSPHATE 10 MG/ML IJ SOLN
INTRAMUSCULAR | Status: DC | PRN
  Administered 2020-09-25: 5 mg via INTRAVENOUS

## 2020-09-25 MED ORDER — BUPIVACAINE HCL (PF) 0.5 % IJ SOLN
INTRAMUSCULAR | Status: DC | PRN
  Administered 2020-09-25: 15 mL via PERINEURAL

## 2020-09-25 MED ORDER — ACETAMINOPHEN 160 MG/5ML PO SOLN: 325.0000 mg | ORAL | Status: DC | PRN

## 2020-09-25 MED ORDER — HYDROCODONE-ACETAMINOPHEN 5-325 MG PO TABS: 1.0000 | ORAL_TABLET | ORAL | 0 refills | Status: DC | PRN

## 2020-09-25 MED ORDER — KETOROLAC TROMETHAMINE 30 MG/ML IJ SOLN
INTRAMUSCULAR | Status: AC
  Filled 2020-09-25: qty 1

## 2020-09-25 MED ORDER — DIPHENHYDRAMINE HCL 50 MG/ML IJ SOLN
INTRAMUSCULAR | Status: DC | PRN
  Administered 2020-09-25: 6.25 mg via INTRAVENOUS

## 2020-09-25 MED ORDER — CHLORHEXIDINE GLUCONATE 0.12 % MT SOLN: 15.0000 mL | Freq: Once | OROMUCOSAL | Status: AC

## 2020-09-25 MED ORDER — OXYCODONE HCL 5 MG/5ML PO SOLN: 5.0000 mg | Freq: Once | ORAL | Status: DC | PRN

## 2020-09-25 MED ORDER — ONDANSETRON HCL 4 MG/2ML IJ SOLN
INTRAMUSCULAR | Status: DC | PRN
  Administered 2020-09-25: 4 mg via INTRAVENOUS

## 2020-09-25 MED ORDER — BUPIVACAINE LIPOSOME 1.3 % IJ SUSP
INTRAMUSCULAR | Status: DC | PRN
  Administered 2020-09-25: 10 mL via PERINEURAL

## 2020-09-25 MED ORDER — EPHEDRINE SULFATE-NACL 50-0.9 MG/10ML-% IV SOSY
PREFILLED_SYRINGE | INTRAVENOUS | Status: DC | PRN
  Administered 2020-09-25 (×3): 5 mg via INTRAVENOUS

## 2020-09-25 MED ORDER — FENTANYL CITRATE (PF) 100 MCG/2ML IJ SOLN: 25.0000 ug | INTRAMUSCULAR | Status: DC | PRN

## 2020-09-25 MED ORDER — ONDANSETRON HCL 4 MG/2ML IJ SOLN: 4.0000 mg | Freq: Once | INTRAMUSCULAR | Status: DC | PRN

## 2020-09-25 MED ORDER — PROPOFOL 10 MG/ML IV BOLUS
INTRAVENOUS | Status: DC | PRN
  Administered 2020-09-25: 200 mg via INTRAVENOUS

## 2020-09-25 MED ORDER — PROPOFOL 10 MG/ML IV BOLUS
INTRAVENOUS | Status: AC
  Filled 2020-09-25: qty 20

## 2020-09-25 MED ORDER — KETOROLAC TROMETHAMINE 30 MG/ML IJ SOLN
INTRAMUSCULAR | Status: DC | PRN
  Administered 2020-09-25: 30 mg via INTRAVENOUS

## 2020-09-25 MED ORDER — LACTATED RINGERS IV SOLN: INTRAVENOUS | Status: DC

## 2020-09-25 MED ORDER — LIDOCAINE 2% (20 MG/ML) 5 ML SYRINGE
INTRAMUSCULAR | Status: DC | PRN
Start: 2020-09-25 — End: 2020-09-25
  Administered 2020-09-25: 40 mg via INTRAVENOUS

## 2020-09-25 MED ORDER — CHLORHEXIDINE GLUCONATE 0.12 % MT SOLN
OROMUCOSAL | Status: AC
  Administered 2020-09-25: 15 mL via OROMUCOSAL
  Filled 2020-09-25: qty 15

## 2020-09-25 MED ORDER — PHENYLEPHRINE 40 MCG/ML (10ML) SYRINGE FOR IV PUSH (FOR BLOOD PRESSURE SUPPORT)
PREFILLED_SYRINGE | INTRAVENOUS | Status: DC | PRN
  Administered 2020-09-25 (×3): 80 ug via INTRAVENOUS

## 2020-09-25 MED ORDER — ORAL CARE MOUTH RINSE: 15.0000 mL | Freq: Once | OROMUCOSAL | Status: AC

## 2020-09-25 MED ORDER — CEFAZOLIN SODIUM-DEXTROSE 2-4 GM/100ML-% IV SOLN
INTRAVENOUS | Status: AC
  Filled 2020-09-25: qty 100

## 2020-09-25 MED ORDER — ONDANSETRON HCL 4 MG/2ML IJ SOLN
INTRAMUSCULAR | Status: AC
  Filled 2020-09-25: qty 2

## 2020-09-25 MED ORDER — MIDAZOLAM HCL 2 MG/2ML IJ SOLN
INTRAMUSCULAR | Status: AC
  Administered 2020-09-25: 2 mg via INTRAVENOUS
  Filled 2020-09-25: qty 2

## 2020-09-25 MED ORDER — FENTANYL CITRATE (PF) 100 MCG/2ML IJ SOLN
INTRAMUSCULAR | Status: AC
  Administered 2020-09-25: 100 ug via INTRAVENOUS
  Filled 2020-09-25: qty 2

## 2020-09-25 MED ORDER — FENTANYL CITRATE (PF) 100 MCG/2ML IJ SOLN: 100.0000 ug | Freq: Once | INTRAMUSCULAR | Status: AC

## 2020-09-25 MED ORDER — CEFAZOLIN SODIUM-DEXTROSE 2-4 GM/100ML-% IV SOLN
2.0000 g | INTRAVENOUS | Status: AC
  Administered 2020-09-25: 2 g via INTRAVENOUS

## 2020-09-25 MED ORDER — MEPERIDINE HCL 25 MG/ML IJ SOLN: 6.2500 mg | INTRAMUSCULAR | Status: DC | PRN

## 2020-09-25 MED ORDER — FENTANYL CITRATE (PF) 250 MCG/5ML IJ SOLN
INTRAMUSCULAR | Status: AC
  Filled 2020-09-25: qty 5

## 2020-09-25 MED ORDER — MIDAZOLAM HCL 2 MG/2ML IJ SOLN: 2.0000 mg | Freq: Once | INTRAMUSCULAR | Status: AC

## 2020-09-25 MED ORDER — 0.9 % SODIUM CHLORIDE (POUR BTL) OPTIME
TOPICAL | Status: DC | PRN
  Administered 2020-09-25: 1000 mL

## 2020-09-25 MED ORDER — ACETAMINOPHEN 325 MG PO TABS: 325.0000 mg | ORAL_TABLET | ORAL | Status: DC | PRN

## 2020-09-25 MED ORDER — OXYCODONE HCL 5 MG PO TABS: 5.0000 mg | ORAL_TABLET | Freq: Once | ORAL | Status: DC | PRN

## 2020-09-25 SURGICAL SUPPLY — 44 items
BAG COUNTER SPONGE SURGICOUNT (BAG) ×2 IMPLANT
BAG SPNG CNTER NS LX DISP (BAG) ×1
BLADE SURG 10 STRL SS (BLADE) ×2 IMPLANT
BNDG COHESIVE 4X5 TAN STRL (GAUZE/BANDAGES/DRESSINGS) ×2 IMPLANT
BNDG COHESIVE 6X5 TAN NS LF (GAUZE/BANDAGES/DRESSINGS) ×1 IMPLANT
BNDG GAUZE ELAST 4 BULKY (GAUZE/BANDAGES/DRESSINGS) ×2 IMPLANT
COVER MAYO STAND STRL (DRAPES) ×2 IMPLANT
COVER SURGICAL LIGHT HANDLE (MISCELLANEOUS) ×2 IMPLANT
DRAPE INCISE IOBAN 66X45 STRL (DRAPES) ×2 IMPLANT
DRAPE U-SHAPE 47X51 STRL (DRAPES) ×2 IMPLANT
DRSG ADAPTIC 3X8 NADH LF (GAUZE/BANDAGES/DRESSINGS) ×2 IMPLANT
DRSG PAD ABDOMINAL 8X10 ST (GAUZE/BANDAGES/DRESSINGS) ×4 IMPLANT
DURAPREP 26ML APPLICATOR (WOUND CARE) ×2 IMPLANT
ELECT REM PT RETURN 9FT ADLT (ELECTROSURGICAL) ×2
ELECTRODE REM PT RTRN 9FT ADLT (ELECTROSURGICAL) ×1 IMPLANT
GAUZE SPONGE 4X4 12PLY STRL (GAUZE/BANDAGES/DRESSINGS) ×2 IMPLANT
GLOVE SURG ORTHO LTX SZ9 (GLOVE) ×2 IMPLANT
GLOVE SURG UNDER POLY LF SZ8.5 (GLOVE) ×2 IMPLANT
GOWN STRL REUS W/ TWL XL LVL3 (GOWN DISPOSABLE) ×1 IMPLANT
GOWN STRL REUS W/TWL XL LVL3 (GOWN DISPOSABLE) ×2
KIT BASIN OR (CUSTOM PROCEDURE TRAY) ×2 IMPLANT
KIT TURNOVER KIT B (KITS) ×2 IMPLANT
MANIFOLD NEPTUNE II (INSTRUMENTS) ×2 IMPLANT
NDL TAPERED W/ NITINOL LOOP (MISCELLANEOUS) IMPLANT
NEEDLE TAPERED W/ NITINOL LOOP (MISCELLANEOUS) ×2 IMPLANT
NS IRRIG 1000ML POUR BTL (IV SOLUTION) ×2 IMPLANT
PACK ORTHO EXTREMITY (CUSTOM PROCEDURE TRAY) ×2 IMPLANT
PAD ABD 8X10 STRL (GAUZE/BANDAGES/DRESSINGS) ×1 IMPLANT
PAD ARMBOARD 7.5X6 YLW CONV (MISCELLANEOUS) ×4 IMPLANT
RETRIEVER SUT HEWSON (MISCELLANEOUS) ×2 IMPLANT
SPONGE T-LAP 18X18 ~~LOC~~+RFID (SPONGE) ×4 IMPLANT
STAPLER VISISTAT 35W (STAPLE) ×2 IMPLANT
STOCKINETTE IMPERVIOUS 9X36 MD (GAUZE/BANDAGES/DRESSINGS) ×2 IMPLANT
STOCKINETTE IMPERVIOUS LG (DRAPES) ×1 IMPLANT
SUT ETHILON 2 0 PSLX (SUTURE) ×2 IMPLANT
SUT FIBERWIRE #2 38 T-5 BLUE (SUTURE) ×4
SUT VIC AB 0 CT1 27 (SUTURE) ×2
SUT VIC AB 0 CT1 27XBRD ANBCTR (SUTURE) ×1 IMPLANT
SUTURE FIBERWR #2 38 T-5 BLUE (SUTURE) ×2 IMPLANT
TOWEL GREEN STERILE (TOWEL DISPOSABLE) ×2 IMPLANT
TOWEL GREEN STERILE FF (TOWEL DISPOSABLE) ×2 IMPLANT
TUBE CONNECTING 12X1/4 (SUCTIONS) ×2 IMPLANT
TUBE ENDOTRAC EMG 7X10.2 (MISCELLANEOUS) ×2 IMPLANT
YANKAUER SUCT BULB TIP NO VENT (SUCTIONS) ×2 IMPLANT

## 2020-09-25 NOTE — Anesthesia Preprocedure Evaluation (Addendum)
Anesthesia Evaluation  Patient identified by MRN, date of birth, ID band Patient awake    Reviewed: Allergy & Precautions, H&P , NPO status , Patient's Chart, lab work & pertinent test results, reviewed documented beta blocker date and time   Airway Mallampati: II  TM Distance: >3 FB Neck ROM: full    Dental no notable dental hx. (+) Teeth Intact, Dental Advisory Given, Caps,    Pulmonary neg pulmonary ROS,    Pulmonary exam normal breath sounds clear to auscultation       Cardiovascular Exercise Tolerance: Good hypertension, Pt. on medications  Rhythm:regular Rate:Normal     Neuro/Psych negative neurological ROS  negative psych ROS   GI/Hepatic Neg liver ROS, GERD  Medicated,  Endo/Other  diabetes, Type 2  Renal/GU negative Renal ROS  negative genitourinary   Musculoskeletal   Abdominal   Peds  Hematology negative hematology ROS (+)   Anesthesia Other Findings   Reproductive/Obstetrics negative OB ROS                            Anesthesia Physical Anesthesia Plan  ASA: 3  Anesthesia Plan: General   Post-op Pain Management: GA combined w/ Regional for post-op pain   Induction: Intravenous  PONV Risk Score and Plan: 2 and Ondansetron and Treatment may vary due to age or medical condition  Airway Management Planned: LMA and Oral ETT  Additional Equipment: None  Intra-op Plan:   Post-operative Plan:   Informed Consent: I have reviewed the patients History and Physical, chart, labs and discussed the procedure including the risks, benefits and alternatives for the proposed anesthesia with the patient or authorized representative who has indicated his/her understanding and acceptance.     Dental Advisory Given  Plan Discussed with: CRNA and Anesthesiologist  Anesthesia Plan Comments:         Anesthesia Quick Evaluation

## 2020-09-25 NOTE — Anesthesia Procedure Notes (Signed)
Procedure Name: LMA Insertion Date/Time: 09/25/2020 12:05 PM Performed by: Marny Lowenstein, CRNA Pre-anesthesia Checklist: Patient identified, Emergency Drugs available, Suction available and Patient being monitored Patient Re-evaluated:Patient Re-evaluated prior to induction Oxygen Delivery Method: Circle system utilized Preoxygenation: Pre-oxygenation with 100% oxygen Induction Type: IV induction Ventilation: Mask ventilation without difficulty LMA: LMA inserted LMA Size: 5.0 Number of attempts: 1 Placement Confirmation: positive ETCO2 and breath sounds checked- equal and bilateral Tube secured with: Tape Dental Injury: Teeth and Oropharynx as per pre-operative assessment

## 2020-09-25 NOTE — Op Note (Signed)
09/25/2020  12:46 PM  PATIENT:  Randall [name]    PRE-OPERATIVE DIAGNOSIS:  Right Quad Tendon Rupture  POST-OPERATIVE DIAGNOSIS:  Same  PROCEDURE:  REPAIR RIGHT QUADRICEP TENDON  SURGEON:  Nadara Mustard, MD  PHYSICIAN ASSISTANT:None ANESTHESIA:   General  PREOPERATIVE INDICATIONS:  [name] is a  65 y.o. male with a diagnosis of Right Quad Tendon Rupture who failed conservative measures and elected for surgical management.    The risks benefits and alternatives were discussed with the patient preoperatively including but not limited to the risks of infection, bleeding, nerve injury, cardiopulmonary complications, the need for revision surgery, among others, and the patient was willing to proceed.  OPERATIVE IMPLANTS: #2 FiberWire  @ENCIMAGES @  OPERATIVE FINDINGS: Retracted quadriceps tendon that required dissection to pull the tendon distally.    OPERATIVE PROCEDURE: Patient was brought the operating room and underwent general anesthetic.  After  Local anesthesia.  Patient's right lower extremity was prepped using DuraPrep draped into a sterile field a timeout was called.  A midline incision was made over the patella carried down to the quad tendon the tendon was retracted.  Dissection was performed to pull the tendon distally.  Using #2 FiberWire in a Krakw suture technique 2 FiberWire's were sutured through the proximal quad tendon with 4 sutures distally.  3 drill holes were then placed through the patella the strands of the FiberWire were placed through the patella the quad tendon was then reapproximated to the patella with the sutures at the distal pole of the patella.  The sutures were then wrapped proximally woven through the quad tendon again and brought down for a secondary repair.  Patient had good range of motion from 0 to 45 degrees without movement of the repair.  The wound was irrigated normal saline this was closed using 2-0 nylon a sterile dressing was applied  patient was extubated taken the PACU in stable condition   DISCHARGE PLANNING:  Antibiotic duration: Preoperative antibiotics  Weightbearing: Weightbearing as tolerated  Pain medication: Prescription for Vicodin  Dressing care/ Wound VAC: Follow-up in 1 week to change the dressing  Ambulatory devices: Crutches  Discharge to: Home.  Follow-up: In the office 1 week post operative.

## 2020-09-25 NOTE — H&P (Signed)
Randall [name] is an 65 y.o. male.   Chief Complaint: Right knee pain inability to extend his knee. HPI: Patient is a 65 year old gentleman who states that he was gardening his foot slipped and he had an acute quadriceps contracture with his leg giving out unable to extend his leg or weight-bear after the injury.  Past Medical History:  Diagnosis Date   Diabetes mellitus without complication (HCC)    GERD (gastroesophageal reflux disease)    Hypertension     Past Surgical History:  Procedure Laterality Date   KIDNEY SURGERY     KNEE SURGERY     LITHOTRIPSY      History reviewed. No pertinent family history. Social History:  reports that he has never smoked. He has never used smokeless tobacco. He reports that he does not drink alcohol and does not use drugs.  Allergies:  Allergies  Allergen Reactions   Norgesic Forte [Orphenadrine-Aspirin-Caffeine]     Unknown reaction    Latex Hives and Rash    Blisters    No medications prior to admission.    No results found for this or any previous visit (from the past 48 hour(s)). No results found.  Review of Systems  All other systems reviewed and are negative.  There were no vitals taken for this visit. Physical Exam  Patient is alert, oriented, no adenopathy, well-dressed, normal affect, normal respiratory effort. Examination patient cannot actively extend the right knee.  There is a palpable defect of approximately 3 cm of the quad tendon at the proximal pole the patella there is no skin breakdown no ulcerations the knee joint is congruent.  The patella is midline. Assessment/Plan 1. Acute pain of right knee   2. Rupture of right quadriceps tendon, initial encounter       Plan: We will schedule patient for quad tendon reconstruction this week.  Patient does have uncontrolled hypertension and has an appointment with his medical doctor to evaluate the hypertension on Wednesday discussed the importance of getting this under  control prior to surgery.  Nadara Mustard, MD 09/25/2020, 7:04 AM

## 2020-09-25 NOTE — Anesthesia Postprocedure Evaluation (Signed)
Anesthesia Post Note  Patient: Randall [name]  Procedure(s) Performed: REPAIR RIGHT QUADRICEP TENDON (Right)     Patient location during evaluation: PACU Anesthesia Type: General Level of consciousness: awake and alert Pain management: pain level controlled Vital Signs Assessment: post-procedure vital signs reviewed and stable Respiratory status: spontaneous breathing, nonlabored ventilation, respiratory function stable and patient connected to nasal cannula oxygen Cardiovascular status: blood pressure returned to baseline and stable Postop Assessment: no apparent nausea or vomiting Anesthetic complications: no   No notable events documented.  Last Vitals:  Vitals:   09/25/20 1315 09/25/20 1330  BP: 139/79 (!) 151/86  Pulse: 70 78  Resp: 15 17  Temp:    SpO2: 95% 95%    Last Pain:  Vitals:   09/25/20 1315  TempSrc:   PainSc: 0-No pain                 Mical Kicklighter

## 2020-09-25 NOTE — Transfer of Care (Signed)
Immediate Anesthesia Transfer of Care Note  Patient: Randall [name]  Procedure(s) Performed: REPAIR RIGHT QUADRICEP TENDON (Right)  Patient Location: PACU  Anesthesia Type:General and Regional  Level of Consciousness: drowsy  Airway & Oxygen Therapy: Patient Spontanous Breathing and Patient connected to face mask oxygen  Post-op Assessment: Report given to RN and Post -op Vital signs reviewed and stable  Post vital signs: Reviewed and stable  Last Vitals:  Vitals Value Taken Time  BP 139/81 09/25/20 1259  Temp 36.4 C 09/25/20 1245  Pulse 69 09/25/20 1259  Resp 16 09/25/20 1259  SpO2 91 % 09/25/20 1259  Vitals shown include unvalidated device data.  Last Pain:  Vitals:   09/25/20 1245  TempSrc:   PainSc: Asleep      Patients Stated Pain Goal: 0 (09/25/20 0949)  Complications: No notable events documented.

## 2020-09-25 NOTE — Anesthesia Procedure Notes (Signed)
Anesthesia Regional Block: Femoral nerve block   Pre-Anesthetic Checklist: , timeout performed,  Correct Patient, Correct Site, Correct Laterality,  Correct Procedure, Correct Position, site marked,  Risks and benefits discussed,  Surgical consent,  Pre-op evaluation,  At surgeon's request and post-op pain management  Laterality: Right  Prep: chloraprep       Needles:  Injection technique: Single-shot  Needle Type: Echogenic Stimulator Needle     Needle Length: 5cm  Needle Gauge: 22     Additional Needles:   Procedures:, nerve stimulator,,, ultrasound used (permanent image in chart),,     Nerve Stimulator or Paresthesia:  Response: quadraceps contraction, 0.45 mA  Additional Responses:   Narrative:  Start time: 09/25/2020 10:45 AM End time: 09/25/2020 10:49 AM Injection made incrementally with aspirations every 5 mL.  Performed by: Personally  Anesthesiologist: Bethena Midget, MD  Additional Notes: Functioning IV was confirmed and monitors were applied.  A 70mm 22ga Arrow echogenic stimulator needle was used. Sterile prep and drape,hand hygiene and sterile gloves were used. Ultrasound guidance: relevant anatomy identified, needle position confirmed, local anesthetic spread visualized around nerve(s)., vascular puncture avoided.  Image printed for medical record. Negative aspiration and negative test dose prior to incremental administration of local anesthetic. The patient tolerated the procedure well.

## 2020-09-26 ENCOUNTER — Encounter (HOSPITAL_COMMUNITY): Payer: Self-pay | Admitting: Orthopedic Surgery

## 2020-09-30 ENCOUNTER — Telehealth: Payer: Self-pay | Admitting: Orthopedic Surgery

## 2020-09-30 ENCOUNTER — Other Ambulatory Visit: Payer: Self-pay | Admitting: Physician Assistant

## 2020-09-30 MED ORDER — HYDROCODONE-ACETAMINOPHEN 5-325 MG PO TABS: 1.0000 | ORAL_TABLET | ORAL | 0 refills | Status: DC | PRN

## 2020-09-30 NOTE — Telephone Encounter (Signed)
Patient called needing Rx filled (Hydrocodone) Patient uses [facility] in [place]. The number to contact patient is [phone]

## 2020-09-30 NOTE — Telephone Encounter (Signed)
Rx refilled.

## 2020-10-05 ENCOUNTER — Telehealth: Payer: Self-pay | Admitting: Orthopedic Surgery

## 2020-10-05 ENCOUNTER — Other Ambulatory Visit: Payer: Self-pay | Admitting: Orthopedic Surgery

## 2020-10-05 MED ORDER — HYDROCODONE-ACETAMINOPHEN 5-325 MG PO TABS: 1.0000 | ORAL_TABLET | Freq: Four times a day (QID) | ORAL | 0 refills | Status: DC | PRN

## 2020-10-05 NOTE — Telephone Encounter (Signed)
Pt is s/p a right quad tendon repair 09/30/20 given rx for hydrocodone #30 that day and asking for refill

## 2020-10-05 NOTE — Telephone Encounter (Signed)
Pt would like refill on hydrocodone.  

## 2020-10-09 ENCOUNTER — Ambulatory Visit (INDEPENDENT_AMBULATORY_CARE_PROVIDER_SITE_OTHER): Payer: BC Managed Care – PPO | Admitting: Physician Assistant

## 2020-10-09 ENCOUNTER — Encounter: Payer: Self-pay | Admitting: Physician Assistant

## 2020-10-09 ENCOUNTER — Other Ambulatory Visit: Payer: Self-pay | Admitting: Physician Assistant

## 2020-10-09 ENCOUNTER — Other Ambulatory Visit: Payer: Self-pay

## 2020-10-09 DIAGNOSIS — S76111S Strain of right quadriceps muscle, fascia and tendon, sequela: Secondary | ICD-10-CM

## 2020-10-09 MED ORDER — HYDROCODONE-ACETAMINOPHEN 5-325 MG PO TABS: 1.0000 | ORAL_TABLET | Freq: Four times a day (QID) | ORAL | 0 refills | Status: DC | PRN

## 2020-10-09 NOTE — Progress Notes (Signed)
Office Visit Note   Patient: Randall [name]           Date of Birth: 15-Oct-1955           MRN: [id number] Visit Date: 10/09/2020              Requested by: Barbie Banner, MD [address] [place],  [place] [address] PCP: Barbie Banner, MD  Chief Complaint  Patient presents with   Right Knee - Routine Post Op      HPI: Randall [name] is a pleasant 65 year old gentleman who is 2 weeks status post quadriceps tendon repair.  He is doing well.  He denies fever, chills, or calf pain.  He has been immobilized in a knee immobilizer since surgery patient also inquired that his wife recalls that when he had his EKG before surgery they said that he should follow-up with a cardiologist.  He is in the process of doing that and his wife was wondering if it was anything that they had to follow-up immediately.  She is trying to make sure he is a bit more mobile  Assessment & Plan: Visit Diagnoses:  1. Quadriceps tendon rupture, right, sequela     Plan: Patient was given a Bledsoe brace that will be locked for the next 2 weeks.  We will follow-up at that time at which time the brace can be opened up and begin some flexion of his knee.  We will fill his pain medication.  He should continue to take an aspirin daily and be mindful of any signs of calf pain continue ankle pumps I did tell the patient this is not necessarily my area of expertise.  EKG did not show any acute ischemic findings and certainly they would not have gone through with surgery if there had been.  He has had no had no shortness of breath chest pain lightheadedness.  I encouraged him to follow-up with a cardiologist if any of these occurred should follow-up immediately or go to the nearest emergency room Follow-Up Instructions: Return in about 2 weeks (around 10/23/2020).   Ortho Exam  Patient is alert, oriented, no adenopathy, well-dressed, normal affect, normal respiratory effort. Examination well-healed surgical incision swelling  is well controlled sutures were harvested today.  He has good continuation of the quadriceps tendon.  No cellulitis no signs of infection calves are soft and nontender no Homans' sign  Imaging: No results found. No images are attached to the encounter.  Labs: Lab Results  Component Value Date   HGBA1C (H) 03/04/2010    8.6 (NOTE)                                                                       According to the ADA Clinical Practice Recommendations for 2011, when HbA1c is used as a screening test:   >=6.5%   Diagnostic of Diabetes Mellitus           (if abnormal result  is confirmed)  5.7-6.4%   Increased risk of developing Diabetes Mellitus  References:Diagnosis and Classification of Diabetes Mellitus,Diabetes Care,2011,34(Suppl 1):S62-S69 and Standards of Medical Care in         Diabetes - 2011,Diabetes Care,2011,34  (Suppl 1):S11-S61.   REPTSTATUS 03/10/2010 FINAL 03/04/2010  CULT NO GROWTH 5 DAYS 03/04/2010   LABORGA ESCHERICHIA COLI 03/02/2010     Lab Results  Component Value Date   ALBUMIN 2.2 (L) 03/07/2010   ALBUMIN 2.4 (L) 03/06/2010   ALBUMIN 2.3 (L) 03/06/2010    Lab Results  Component Value Date   MG 2.1 03/07/2010   MG 2.6 (H) 03/06/2010   No results found for: VD25OH  No results found for: PREALBUMIN CBC EXTENDED Latest Ref Rng & Units 09/25/2020 03/07/2010 03/06/2010  WBC 4.0 - 10.5 K/uL 8.3 11.5(H) 16.2(H)  RBC 4.22 - 5.81 MIL/uL 4.10(L) 3.84(L) 3.94(L)  HGB 13.0 - 17.0 g/dL 12.6(L) 11.5(L) 11.7(L)  HCT 39.0 - 52.0 % 38.6(L) 35.6(L) 36.2(L)  PLT 150 - 400 K/uL 168 133(L) 115(L)  NEUTROABS 1.7 - 7.7 K/uL - - -  LYMPHSABS 0.7 - 4.0 K/uL - - -     There is no height or weight on file to calculate BMI.  Orders:  No orders of the defined types were placed in this encounter.  No orders of the defined types were placed in this encounter.    Procedures: No procedures performed  Clinical Data: No additional findings.  ROS:  All other systems  negative, except as noted in the HPI. Review of Systems  Objective: Vital Signs: There were no vitals taken for this visit.  Specialty Comments:  No specialty comments available.  PMFS History: Patient Active Problem List   Diagnosis Date Noted   Quadriceps tendon rupture, right, sequela    Past Medical History:  Diagnosis Date   Complication of anesthesia    Diabetes mellitus without complication (HCC)    GERD (gastroesophageal reflux disease)    Hypertension    PONV (postoperative nausea and vomiting)     History reviewed. No pertinent family history.  Past Surgical History:  Procedure Laterality Date   KIDNEY SURGERY     KNEE SURGERY     LITHOTRIPSY     QUADRICEPS TENDON REPAIR Right 09/25/2020   Procedure: REPAIR RIGHT QUADRICEP TENDON;  Surgeon: Nadara Mustard, MD;  Location: [facility] OR;  Service: Orthopedics;  Laterality: Right;   Social History   Occupational History   Not on file  Tobacco Use   Smoking status: Never   Smokeless tobacco: Never  Vaping Use   Vaping Use: Never used  Substance and Sexual Activity   Alcohol use: Never   Drug use: Never   Sexual activity: Not on file

## 2020-10-12 ENCOUNTER — Telehealth: Payer: Self-pay

## 2020-10-12 NOTE — Telephone Encounter (Signed)
NOTES ON FILE FROM  [facility] [phone] SENT REFERRAL TO SCHEDULING 

## 2020-10-16 ENCOUNTER — Telehealth: Payer: Self-pay

## 2020-10-16 NOTE — Telephone Encounter (Signed)
NOTES SCANNED TO REFERRAL 

## 2020-10-23 ENCOUNTER — Other Ambulatory Visit: Payer: Self-pay

## 2020-10-23 ENCOUNTER — Encounter: Payer: Self-pay | Admitting: Family

## 2020-10-23 ENCOUNTER — Ambulatory Visit (INDEPENDENT_AMBULATORY_CARE_PROVIDER_SITE_OTHER): Payer: BC Managed Care – PPO | Admitting: Family

## 2020-10-23 DIAGNOSIS — S76111S Strain of right quadriceps muscle, fascia and tendon, sequela: Secondary | ICD-10-CM

## 2020-10-23 DIAGNOSIS — M25561 Pain in right knee: Secondary | ICD-10-CM

## 2020-10-23 NOTE — Progress Notes (Signed)
   Post-Op Visit Note   Patient: Randall [name]           Date of Birth: 19-Dec-1955           MRN: [id number] Visit Date: 10/23/2020 PCP: Barbie Banner, MD  Chief Complaint: No chief complaint on file.   HPI:  HPI Patient is a 65 year old gentleman seen for week status post quad tendon repair.  He has been in a Bledsoe locked in extension.  Ortho Exam On examination of the right knee his incision is well-healed.  Steri-Strips removed today.  There is no surrounding erythema no drainage no open area very minimal edema.    Visit Diagnoses: No diagnosis found.  Plan: Given 30 degrees of flexion on his bledsoe.  He will continue the [facility] around-the-clock.  May weight-bear as tolerated  Follow-Up Instructions: Return in about 13 days (around 11/05/2020).   Imaging: No results found.  Orders:  No orders of the defined types were placed in this encounter.  No orders of the defined types were placed in this encounter.    PMFS History: Patient Active Problem List   Diagnosis Date Noted   Quadriceps tendon rupture, right, sequela    Past Medical History:  Diagnosis Date   Complication of anesthesia    Diabetes mellitus without complication (HCC)    GERD (gastroesophageal reflux disease)    Hypertension    PONV (postoperative nausea and vomiting)     History reviewed. No pertinent family history.  Past Surgical History:  Procedure Laterality Date   KIDNEY SURGERY     KNEE SURGERY     LITHOTRIPSY     QUADRICEPS TENDON REPAIR Right 09/25/2020   Procedure: REPAIR RIGHT QUADRICEP TENDON;  Surgeon: Nadara Mustard, MD;  Location: [facility] OR;  Service: Orthopedics;  Laterality: Right;   Social History   Occupational History   Not on file  Tobacco Use   Smoking status: Never   Smokeless tobacco: Never  Vaping Use   Vaping Use: Never used  Substance and Sexual Activity   Alcohol use: Never   Drug use: Never   Sexual activity: Not on file

## 2020-11-05 ENCOUNTER — Ambulatory Visit (INDEPENDENT_AMBULATORY_CARE_PROVIDER_SITE_OTHER): Payer: BC Managed Care – PPO | Admitting: Orthopedic Surgery

## 2020-11-05 DIAGNOSIS — S76111S Strain of right quadriceps muscle, fascia and tendon, sequela: Secondary | ICD-10-CM

## 2020-11-05 DIAGNOSIS — M79604 Pain in right leg: Secondary | ICD-10-CM

## 2020-11-13 ENCOUNTER — Other Ambulatory Visit: Payer: Self-pay

## 2020-11-13 ENCOUNTER — Ambulatory Visit: Payer: BC Managed Care – PPO | Admitting: Cardiology

## 2020-11-13 ENCOUNTER — Encounter: Payer: Self-pay | Admitting: Cardiology

## 2020-11-13 VITALS — BP 138/64 | HR 71 | Ht 68.5 in | Wt 239.5 lb

## 2020-11-13 DIAGNOSIS — I10 Essential (primary) hypertension: Secondary | ICD-10-CM | POA: Diagnosis not present

## 2020-11-13 DIAGNOSIS — I110 Hypertensive heart disease with heart failure: Secondary | ICD-10-CM | POA: Insufficient documentation

## 2020-11-13 DIAGNOSIS — I4510 Unspecified right bundle-branch block: Secondary | ICD-10-CM | POA: Insufficient documentation

## 2020-11-13 DIAGNOSIS — I517 Cardiomegaly: Secondary | ICD-10-CM

## 2020-11-13 NOTE — Progress Notes (Signed)
Primary Care Provider: Christain Sacramento, MD [facility] Cardiologist: Glenetta Hew, MD Electrophysiologist: None  Clinic Note: Chief Complaint  Patient presents with   New Patient (Initial Visit)    Hypertension & abnormal EKG     ===================================  ASSESSMENT/PLAN   -Echo ordered for further evaluation of systolic murmur and right bundle branch block noted on EKG.  -Instructed patient to determine which ARB he is taking, otherwise continue current anti-hypertensive regimen. (Pt called in prior to completion of Clinic day -- taking Telmesartan - NOT Losartan. -Follow up in 1 month to discuss echo and determine next steps of intervention.    Donney Dice, DO, R2 (Fam Med)  Problem List Items Addressed This Visit       Cardiology Problems   Essential hypertension - Primary    Blood pressure actually looks decent today.  Gwenlyn Found is on multiple medications.  Not actually on a diuretic at this point however.  We clarified that he is not taking losartan.  I agree that telmisartan is a better option.  He is on high-dose carvedilol but with his weight this could still be titrated further.  He is also on max dose of amlodipine.  Telmisartan is also at max dose.    Next option would likely be diuretic such as chlorthalidone.      Relevant Medications   amLODipine (NORVASC) 10 MG tablet   carvedilol (COREG) 25 MG tablet   Right bundle branch block    Relatively benign finding on EKG that is not new.  Explained the pathophysiology of this finding to the patient. Is a very unusual right bundle branch block with high voltages.  There is consideration of possible LVH.  Because the gross abnormality of the EKG, I do agree with checking 2D echocardiogram.      Relevant Medications   amLODipine (NORVASC) 10 MG tablet   carvedilol (COREG) 25 MG tablet   Other Relevant Orders   EKG 12-Lead   ECHOCARDIOGRAM COMPLETE   Left ventricular hypertrophy    Clearly LVH  voltage on EKG.  With significant retention, will check 2D echocardiogram to see if there is any evidence of hypertensive heart disease.  Evaluate diastolic pressures etc.      Relevant Medications   amLODipine (NORVASC) 10 MG tablet   carvedilol (COREG) 25 MG tablet   Other Relevant Orders   EKG 12-Lead   ECHOCARDIOGRAM COMPLETE    ===================================  HPI:    Randall [name] is a obese 65 y.o. male notable for HTN, DM-2, & GERD who is being seen today for the evaluation of Abnormal EKG, and Hypertension Management at the request of Christain Sacramento, MD.   Randall [name]  was last seen on 9/14 by PCP after a fall from a tree - suffering a quadriceps tendon tear.. At this visit, he was started on coreg 12.5 mg bid in order to achieve better blood pressure control prior to processing neck surgery.   This will was later increased to 25 mg.  EKG noted to have abnormal EKG (? RBB). --> referred to Cardiology.  Will  The following studies were reviewed today: (if available, images/films reviewed: From Epic Chart or Care Everywhere) EKG 9/16: Presence of right bundle branch block    Interval History:   Randall [name] is a 65 year old male with PMH of hypertension, GERD and DM-2. Here for further evaluation after being referred by PCP due to abnormal EKG while in the ED for non-cardiac complaint.  Had an accident a few months ago in September of this year, EKG was performed and patient was suggested to see cardiologist for further evaluation and also because of persistently elevated blood pressures despite being on 3 anti-hypertensive agents. Currently on amlodipine 10 mg, coreg 25 bid, unsure if losartan 25 mg or telmisartan 80 mg. BP typically ranges around systolic 170-185 and diastolic 70-80s. Family history significant for paternal history of enlarged heart, father passed away at 20 years old. Mother had cardia issues but he is unsure of the exact cardiac condition. No  cardiac disease family history early in life. Never smoked. Balanced diet.  History of Endorsing some dyspnea bu able to take the stairs without issues. Previously was very active before his surgery. Dyspnea has improved since surgery but reports he has not been active enough to have dyspnea on exertion.   Occasional swelling more in the right leg than the left.  CV Review of Symptoms (Summary) Cardiovascular ROS: positive for - - Some dyspnea but able to take stairs without difficulty prior to his injury.  It is somewhat difficult for her to take stairs now 3 years of dyspnea is associated with difficulty walking, but it is improving as his mobility improves. negative for - chest pain, edema, irregular heartbeat, orthopnea, palpitations, paroxysmal nocturnal dyspnea, rapid heart rate, shortness of breath, or lightheadedness, dizziness or wooziness, syncope/near syncope or TIA/amaurosis fugax, claudication   REVIEWED OF SYSTEMS   Review of Systems  Constitutional:  Negative for malaise/fatigue (He has become somewhat sedentary since his injury, but not usually.) and weight loss.  HENT:  Negative for congestion and nosebleeds.   Respiratory:  Negative for cough and shortness of breath.   Cardiovascular:  Positive for leg swelling (occasional). Negative for chest pain and claudication.  Gastrointestinal:  Negative for blood in stool and melena.  Genitourinary:  Negative for frequency and hematuria.  Musculoskeletal:  Negative for back pain and joint pain.       R leg brace in place following surgery -- obviously sore.  Neurological:  Negative for dizziness, focal weakness and weakness.  Psychiatric/Behavioral:  Negative for depression and memory loss. The patient is not nervous/anxious and does not have insomnia.    I have reviewed and (if needed) personally updated the patient's problem list, medications, allergies, past medical and surgical history, social and family history.   PAST MEDICAL  HISTORY   Past Medical History:  Diagnosis Date   Complication of anesthesia    Diabetes mellitus without complication (HCC)    GERD (gastroesophageal reflux disease)    Hypertension    PONV (postoperative nausea and vomiting)    Rupture of right quadriceps tendon 09/21/2020   - s/p srugical repair    PAST SURGICAL HISTORY   Past Surgical History:  Procedure Laterality Date   KIDNEY SURGERY     KNEE SURGERY     LITHOTRIPSY     QUADRICEPS TENDON REPAIR Right 09/25/2020   Procedure: REPAIR RIGHT QUADRICEP TENDON;  Surgeon: Nadara Mustard, MD;  Location: [facility] OR;  Service: Orthopedics;  Laterality: Right;    Immunization History  Administered Date(s) Administered   Tdap 08/18/2018    MEDICATIONS/ALLERGIES   Current Meds  Medication Sig   acetaminophen (TYLENOL) 500 MG tablet Take 1,000 mg by mouth every 6 (six) hours as needed for moderate pain or headache.   amLODipine (NORVASC) 10 MG tablet Take 10 mg by mouth daily. Take 1 tablet daily   aspirin EC 81 MG tablet Take 81  mg by mouth daily. Swallow whole.   atorvastatin (LIPITOR) 20 MG tablet Take 20 mg by mouth daily.   carvedilol (COREG) 25 MG tablet Take 25 mg by mouth 2 (two) times daily with a meal. - recently increased to 25 mg BID   cetirizine (ZYRTEC) 10 MG tablet Take 10 mg by mouth daily as needed for allergies.   diphenhydrAMINE (BENADRYL) 25 MG tablet Take 25-50 mg by mouth every 6 (six) hours as needed for itching.   famotidine (PEPCID) 20 MG tablet Take 20 mg by mouth 2 (two) times daily.   glimepiride (AMARYL) 2 MG tablet Take 2 mg by mouth daily with breakfast.   glucose blood (FREESTYLE LITE) test strip Test glucose BID   Diagnoses Code: E11.9   Liniments (DEEP BLUE RELIEF EX) Apply 1 application topically daily as needed (pain).   losartan (COZAAR) 25 MG tablet Take 25 mg by mouth daily. - NOT TAKING   meloxicam (MOBIC) 15 MG tablet Take 15 mg by mouth daily.   metFORMIN (GLUCOPHAGE-XR) 500 MG 24 hr tablet  Take 1,000 mg by mouth 2 (two) times daily.   OVER THE COUNTER MEDICATION Take 2 capsules by mouth daily. xEO Mega supplement   OVER THE COUNTER MEDICATION Take 2 capsules by mouth daily. alpha crs plus supplement   OVER THE COUNTER MEDICATION Take 2 capsules by mouth daily. microplex vmz supplement   OVER THE COUNTER MEDICATION Take 2 tablets by mouth daily as needed (pain). Deep blue pain tablets   OVER THE COUNTER MEDICATION Take 2 capsules by mouth daily. Mito50max supplement   pantoprazole (PROTONIX) 40 MG tablet Take 40 mg by mouth daily.   potassium citrate (UROCIT-K) 10 MEQ (1080 MG) SR tablet Take 10 mEq by mouth 2 (two) times daily.   pseudoephedrine (SUDAFED) 120 MG 12 hr tablet Take 120 mg by mouth every 12 (twelve) hours as needed for congestion.   sitaGLIPtin (JANUVIA) 100 MG tablet Take 100 mg by mouth daily.   tadalafil (CIALIS) 20 MG tablet Take 20 mg by mouth daily as needed for erectile dysfunction.   telmisartan (MICARDIS) 80 MG tablet Take 80 mg by mouth daily. - TAKING!   triamcinolone cream (KENALOG) 0.1 % Apply 1 application topically 3 (three) times daily as needed for itching (rash).   TRULICITY 1.5 0000000 SOPN SMARTSIG:0.5 Milliliter(s) SUB-Q Once a Week    Allergies  Allergen Reactions   Norgesic Forte [Orphenadrine-Aspirin-Caffeine]     Unknown reaction    Latex Hives and Rash    Blisters    SOCIAL HISTORY/FAMILY HISTORY   Reviewed in Epic:   Social History   Tobacco Use   Smoking status: Never   Smokeless tobacco: Never  Vaping Use   Vaping Use: Never used  Substance Use Topics   Alcohol use: Never   Drug use: Never   Social History   Social History Narrative   Usually very active - but no "routine exercise".   History reviewed. No pertinent family history.  OBJCTIVE -PE, EKG, labs   Wt Readings from Last 3 Encounters:  11/13/20 239 lb 8 oz (108.6 kg)  09/25/20 230 lb (104.3 kg)  08/18/18 241 lb (109.3 kg)    Physical Exam: BP  138/64 (BP Location: Left Arm, Patient Position: Sitting, Cuff Size: Large)   Pulse 71   Ht 5' 8.5" (1.74 m)   Wt 239 lb 8 oz (108.6 kg)   SpO2 98%   BMI 35.89 kg/m  Physical Exam Constitutional:  General: He is not in acute distress.    Appearance: Normal appearance.  Cardiovascular:     Rate and Rhythm: Normal rate and regular rhythm.     Pulses: Normal pulses.     Heart sounds: Murmur (1/6 systolic murmur noted along right sternal border) heard.    No gallop.  Pulmonary:     Effort: Pulmonary effort is normal. No respiratory distress.     Breath sounds: Normal breath sounds. No wheezing, rhonchi or rales.  Musculoskeletal:        General: Signs of injury (recent surgery of left LE) present. No tenderness.     Right lower leg: Edema present.  Skin:    General: Skin is warm and dry.     Capillary Refill: Capillary refill takes less than 2 seconds.  Neurological:     Mental Status: He is alert.  -exam per Dr. Ellyn Hack   Adult ECG Report  Rate: 70 ;  Rhythm: normal sinus rhythm;   Narrative Interpretation: Sinus rhythm with right bundle branch block and LVH  Recent Labs:   No lipids checked No results found for: CHOL, HDL, LDLCALC, LDLDIRECT, TRIG, CHOLHDL Lab Results  Component Value Date   CREATININE 0.68 09/25/2020   BUN 20 09/25/2020   NA 134 (L) 09/25/2020   K 4.3 09/25/2020   CL 101 09/25/2020   CO2 23 09/25/2020   CBC Latest Ref Rng & Units 09/25/2020 03/07/2010 03/06/2010  WBC 4.0 - 10.5 K/uL 8.3 11.5(H) 16.2(H)  Hemoglobin 13.0 - 17.0 g/dL 12.6(L) 11.5(L) 11.7(L)  Hematocrit 39.0 - 52.0 % 38.6(L) 35.6(L) 36.2(L)  Platelets 150 - 400 K/uL 168 133(L) 115(L)    No results found for: TSH   Signed,  Taeko Schaffer, DO (R2 Fam Med)  ==================================================  COVID-19 Education: The signs and symptoms of COVID-19 were discussed with the patient and how to seek care for testing (follow up with PCP or arrange E-visit).    Together  with Dr. Larae Grooms, we spent a total of 45 minutes with the patient spent in direct patient consultation.  Additional time spent with chart review  / charting (studies, outside notes, etc): 16 min pre-charting & 21 min post-visit. Total Time: 82 min  Current medicines are reviewed at length with the patient today.  (+/- concerns) N/a  This visit occurred during the SARS-CoV-2 public health emergency.  Safety protocols were in place, including screening questions prior to the visit, additional usage of staff PPE, and extensive cleaning of exam room while observing appropriate contact time as indicated for disinfecting solutions.  Notice: This dictation was prepared with Dragon dictation along with smart phrase technology. Any transcriptional errors that result from this process are unintentional and may not be corrected upon review.   Studies Ordered:  Orders Placed This Encounter  Procedures   EKG 12-Lead   ECHOCARDIOGRAM COMPLETE    Patient Instructions / Medication Changes & Studies & Tests Ordered   Patient Instructions  Medication Instructions:   NO CHANGES    CONTACT OFFICE LET us [facility]  OR [place]  *If you need a refill on your cardiac medications before your next appointment, please call your pharmacy*   Lab Work: NOT NEEDED  Testing/Procedures: WILL BE SCHEDULE AT [place] [facility] Your physician has requested that you have an echocardiogram. Echocardiography is a painless test that uses sound waves to create images of your heart. It provides your doctor with information about the size and shape of your  heart and how well your heart's chambers and valves are working. This procedure takes approximately one hour. There are no restrictions for this procedure.    Follow-Up: At [facility], you and your health needs are our priority.  As part of our continuing mission to provide you with exceptional heart care, we have created  designated Provider Care Teams.  These Care Teams include your primary Cardiologist (physician) and Advanced Practice Providers (APPs -  Physician Assistants and Nurse Practitioners) who all work together to provide you with the care you need, when you need it.     Your next appointment:   1 month(s)  The format for your next appointment:   In Person OR VIRTUAL   Provider:   None      Glenetta Hew, MD Glenetta Hew, M.D., M.S. Interventional Cardiologist   Pager # [phone] Phone # [phone] [address] [place],  [address]    Thank you for choosing Heartcare at [facility]!!

## 2020-11-13 NOTE — Patient Instructions (Addendum)
Medication Instructions:   NO CHANGES    CONTACT OFFICE LET us KNOW WHICH MEDICATIONS   MICARDIS  OR [place]  *If you need a refill on your cardiac medications before your next appointment, please call your pharmacy*   Lab Work: NOT NEEDED  Testing/Procedures: WILL BE SCHEDULE AT [address] Your physician has requested that you have an echocardiogram. Echocardiography is a painless test that uses sound waves to create images of your heart. It provides your doctor with information about the size and shape of your heart and how well your heart's chambers and valves are working. This procedure takes approximately one hour. There are no restrictions for this procedure.    Follow-Up: At [facility], you and your health needs are our priority.  As part of our continuing mission to provide you with exceptional heart care, we have created designated Provider Care Teams.  These Care Teams include your primary Cardiologist (physician) and Advanced Practice Providers (APPs -  Physician Assistants and Nurse Practitioners) who all work together to provide you with the care you need, when you need it.     Your next appointment:   1 month(s)  The format for your next appointment:   In Person OR VIRTUAL   Provider:   None

## 2020-11-14 ENCOUNTER — Encounter: Payer: Self-pay | Admitting: Cardiology

## 2020-11-14 NOTE — Assessment & Plan Note (Signed)
Relatively benign finding on EKG that is not new.  Explained the pathophysiology of this finding to the patient. Is a very unusual right bundle branch block with high voltages.  There is consideration of possible LVH.  Because the gross abnormality of the EKG, I do agree with checking 2D echocardiogram.

## 2020-11-14 NOTE — Progress Notes (Signed)
Clinic visit today was performed by myself and Dr. Robyne Peers (R2, Fam Med) in tandem.  We both saw the patient together Dr. Robyne Peers serving as scribe for the history and review of symptoms portion, along with the assessment and plan summary.  The physical examination, and formal assessment plan for formal myself.  The chart review was performed by myself.  Bryan Lemma, MD

## 2020-11-14 NOTE — Assessment & Plan Note (Signed)
Clearly LVH voltage on EKG.  With significant retention, will check 2D echocardiogram to see if there is any evidence of hypertensive heart disease.  Evaluate diastolic pressures etc.

## 2020-11-14 NOTE — Assessment & Plan Note (Signed)
Blood pressure actually looks decent today.  Allyson Sabal is on multiple medications.  Not actually on a diuretic at this point however.  We clarified that he is not taking losartan.  I agree that telmisartan is a better option.  He is on high-dose carvedilol but with his weight this could still be titrated further.  He is also on max dose of amlodipine.  Telmisartan is also at max dose.    Next option would likely be diuretic such as chlorthalidone.

## 2020-11-17 ENCOUNTER — Encounter: Payer: Self-pay | Admitting: Orthopedic Surgery

## 2020-11-17 NOTE — Progress Notes (Signed)
Office Visit Note   Patient: Randall [name]           Date of Birth: 1955-08-25           MRN: [id number] Visit Date: 11/05/2020              Requested by: Barbie Banner, MD [address] [place],  [place] [address] PCP: Barbie Banner, MD  Chief Complaint  Patient presents with   Right Leg - Routine Post Op    09/25/20 right quad tendon repair  Bledsoe set at 30 degrees       HPI: Patient is a 65 year old gentleman who is about 6 weeks status post right quad tendon repair the Bledsoe is currently set for 30 degrees range of motion currently using crutches.  Assessment & Plan: Visit Diagnoses:  1. Quadriceps tendon rupture, right, sequela     Plan: Patient has full active extension we will release the Bledsoe brace and start physical therapy.  Follow-Up Instructions: Return in about 4 weeks (around 12/03/2020).   Ortho Exam  Patient is alert, oriented, no adenopathy, well-dressed, normal affect, normal respiratory effort. Examination patient can hold a straight leg raise without extensor lag.  The incision is well-healed.  Imaging: No results found. No images are attached to the encounter.  Labs: Lab Results  Component Value Date   HGBA1C (H) 03/04/2010    8.6 (NOTE)                                                                       According to the ADA Clinical Practice Recommendations for 2011, when HbA1c is used as a screening test:   >=6.5%   Diagnostic of Diabetes Mellitus           (if abnormal result  is confirmed)  5.7-6.4%   Increased risk of developing Diabetes Mellitus  References:Diagnosis and Classification of Diabetes Mellitus,Diabetes Care,2011,34(Suppl 1):S62-S69 and Standards of Medical Care in         Diabetes - 2011,Diabetes Care,2011,34  (Suppl 1):S11-S61.   REPTSTATUS 03/10/2010 FINAL 03/04/2010   CULT NO GROWTH 5 DAYS 03/04/2010   LABORGA ESCHERICHIA COLI 03/02/2010     Lab Results  Component Value Date   ALBUMIN 2.2 (L)  03/07/2010   ALBUMIN 2.4 (L) 03/06/2010   ALBUMIN 2.3 (L) 03/06/2010    Lab Results  Component Value Date   MG 2.1 03/07/2010   MG 2.6 (H) 03/06/2010   No results found for: VD25OH  No results found for: PREALBUMIN CBC EXTENDED Latest Ref Rng & Units 09/25/2020 03/07/2010 03/06/2010  WBC 4.0 - 10.5 K/uL 8.3 11.5(H) 16.2(H)  RBC 4.22 - 5.81 MIL/uL 4.10(L) 3.84(L) 3.94(L)  HGB 13.0 - 17.0 g/dL 12.6(L) 11.5(L) 11.7(L)  HCT 39.0 - 52.0 % 38.6(L) 35.6(L) 36.2(L)  PLT 150 - 400 K/uL 168 133(L) 115(L)  NEUTROABS 1.7 - 7.7 K/uL - - -  LYMPHSABS 0.7 - 4.0 K/uL - - -     There is no height or weight on file to calculate BMI.  Orders:  No orders of the defined types were placed in this encounter.  No orders of the defined types were placed in this encounter.    Procedures: No procedures performed  Clinical  Data: No additional findings.  ROS:  All other systems negative, except as noted in the HPI. Review of Systems  Objective: Vital Signs: There were no vitals taken for this visit.  Specialty Comments:  No specialty comments available.  PMFS History: Patient Active Problem List   Diagnosis Date Noted   Essential hypertension 11/13/2020   Right bundle branch block 11/13/2020   Left ventricular hypertrophy 11/13/2020   Quadriceps tendon rupture, right, sequela    Past Medical History:  Diagnosis Date   Complication of anesthesia    Diabetes mellitus without complication (HCC)    GERD (gastroesophageal reflux disease)    Hypertension    PONV (postoperative nausea and vomiting)    Rupture of right quadriceps tendon 09/21/2020   - s/p srugical repair    History reviewed. No pertinent family history.  Past Surgical History:  Procedure Laterality Date   KIDNEY SURGERY     KNEE SURGERY     LITHOTRIPSY     QUADRICEPS TENDON REPAIR Right 09/25/2020   Procedure: REPAIR RIGHT QUADRICEP TENDON;  Surgeon: Nadara Mustard, MD;  Location: [facility] OR;  Service: Orthopedics;   Laterality: Right;   Social History   Occupational History   Not on file  Tobacco Use   Smoking status: Never   Smokeless tobacco: Never  Vaping Use   Vaping Use: Never used  Substance and Sexual Activity   Alcohol use: Never   Drug use: Never   Sexual activity: Not on file

## 2020-12-08 ENCOUNTER — Other Ambulatory Visit: Payer: Self-pay

## 2020-12-08 ENCOUNTER — Ambulatory Visit (HOSPITAL_COMMUNITY): Payer: BC Managed Care – PPO | Attending: Cardiology

## 2020-12-08 DIAGNOSIS — I517 Cardiomegaly: Secondary | ICD-10-CM | POA: Insufficient documentation

## 2020-12-08 DIAGNOSIS — I4510 Unspecified right bundle-branch block: Secondary | ICD-10-CM | POA: Diagnosis not present

## 2020-12-08 LAB — ECHOCARDIOGRAM COMPLETE
Area-P 1/2: 3.12 cm2
S' Lateral: 3.85 cm

## 2020-12-08 MED ORDER — PERFLUTREN LIPID MICROSPHERE
1.0000 mL | INTRAVENOUS | Status: AC | PRN
  Administered 2020-12-08: 2 mL via INTRAVENOUS

## 2020-12-10 ENCOUNTER — Encounter: Payer: Self-pay | Admitting: Orthopedic Surgery

## 2020-12-10 ENCOUNTER — Ambulatory Visit (INDEPENDENT_AMBULATORY_CARE_PROVIDER_SITE_OTHER): Payer: BC Managed Care – PPO | Admitting: Orthopedic Surgery

## 2020-12-10 DIAGNOSIS — M25561 Pain in right knee: Secondary | ICD-10-CM

## 2020-12-10 DIAGNOSIS — S76111S Strain of right quadriceps muscle, fascia and tendon, sequela: Secondary | ICD-10-CM

## 2020-12-10 NOTE — Progress Notes (Signed)
Office Visit Note   Patient: Randall [name]           Date of Birth: 1955/08/19           MRN: [id number] Visit Date: 12/10/2020              Requested by: Barbie Banner, MD [address] [place],  [place] [address] PCP: Barbie Banner, MD  Chief Complaint  Patient presents with   Right Leg - Routine Post Op    Right quad tendon repair 09/25/20      HPI: Patient is a 65 year old gentleman who is 10 weeks status post right quad tendon reconstruction he has been in a Bledsoe brace with full range of motion.  Assessment & Plan: Visit Diagnoses:  1. Quadriceps tendon rupture, right, sequela     Plan: Patient will discontinue the Bledsoe brace he has 6 physical therapy visits to complete he will continue with daily strengthening for the quads.  Follow-Up Instructions: Return if symptoms worsen or fail to improve.   Ortho Exam  Patient is alert, oriented, no adenopathy, well-dressed, normal affect, normal respiratory effort. Examination patient has full active extension with no extensor lag he has range of motion from 0 to 100 degrees.  There is no palpable defect of the repair there is no knee effusion he does have quad atrophy.  Imaging: No results found. No images are attached to the encounter.  Labs: Lab Results  Component Value Date   HGBA1C (H) 03/04/2010    8.6 (NOTE)                                                                       According to the ADA Clinical Practice Recommendations for 2011, when HbA1c is used as a screening test:   >=6.5%   Diagnostic of Diabetes Mellitus           (if abnormal result  is confirmed)  5.7-6.4%   Increased risk of developing Diabetes Mellitus  References:Diagnosis and Classification of Diabetes Mellitus,Diabetes Care,2011,34(Suppl 1):S62-S69 and Standards of Medical Care in         Diabetes - 2011,Diabetes Care,2011,34  (Suppl 1):S11-S61.   REPTSTATUS 03/10/2010 FINAL 03/04/2010   CULT NO GROWTH 5 DAYS 03/04/2010    LABORGA ESCHERICHIA COLI 03/02/2010     Lab Results  Component Value Date   ALBUMIN 2.2 (L) 03/07/2010   ALBUMIN 2.4 (L) 03/06/2010   ALBUMIN 2.3 (L) 03/06/2010    Lab Results  Component Value Date   MG 2.1 03/07/2010   MG 2.6 (H) 03/06/2010   No results found for: VD25OH  No results found for: PREALBUMIN CBC EXTENDED Latest Ref Rng & Units 09/25/2020 03/07/2010 03/06/2010  WBC 4.0 - 10.5 K/uL 8.3 11.5(H) 16.2(H)  RBC 4.22 - 5.81 MIL/uL 4.10(L) 3.84(L) 3.94(L)  HGB 13.0 - 17.0 g/dL 12.6(L) 11.5(L) 11.7(L)  HCT 39.0 - 52.0 % 38.6(L) 35.6(L) 36.2(L)  PLT 150 - 400 K/uL 168 133(L) 115(L)  NEUTROABS 1.7 - 7.7 K/uL - - -  LYMPHSABS 0.7 - 4.0 K/uL - - -     There is no height or weight on file to calculate BMI.  Orders:  No orders of the defined types were placed in this  encounter.  No orders of the defined types were placed in this encounter.    Procedures: No procedures performed  Clinical Data: No additional findings.  ROS:  All other systems negative, except as noted in the HPI. Review of Systems  Objective: Vital Signs: There were no vitals taken for this visit.  Specialty Comments:  No specialty comments available.  PMFS History: Patient Active Problem List   Diagnosis Date Noted   Essential hypertension 11/13/2020   Right bundle branch block 11/13/2020   Left ventricular hypertrophy 11/13/2020   Quadriceps tendon rupture, right, sequela    Past Medical History:  Diagnosis Date   Complication of anesthesia    Diabetes mellitus without complication (HCC)    GERD (gastroesophageal reflux disease)    Hypertension    PONV (postoperative nausea and vomiting)    Rupture of right quadriceps tendon 09/21/2020   - s/p srugical repair    History reviewed. No pertinent family history.  Past Surgical History:  Procedure Laterality Date   KIDNEY SURGERY     KNEE SURGERY     LITHOTRIPSY     QUADRICEPS TENDON REPAIR Right 09/25/2020   Procedure: REPAIR  RIGHT QUADRICEP TENDON;  Surgeon: Nadara Mustard, MD;  Location: [facility] OR;  Service: Orthopedics;  Laterality: Right;   Social History   Occupational History   Not on file  Tobacco Use   Smoking status: Never   Smokeless tobacco: Never  Vaping Use   Vaping Use: Never used  Substance and Sexual Activity   Alcohol use: Never   Drug use: Never   Sexual activity: Not on file

## 2020-12-11 ENCOUNTER — Telehealth: Payer: Self-pay | Admitting: *Deleted

## 2020-12-11 NOTE — Telephone Encounter (Signed)
-----   Message from Marykay Lex, MD sent at 12/09/2020  8:42 PM EST ----- Echocardiogram results: Low normal to mildly reduced pump function with an ejection fraction of 45 to 50%.  Normal range is 50 to 70%.  No regional motion abnormalities, the entire heart seems to be down.  Normal valves.  Normal pressures.  I suspect that the small drop in pump function is potentially related to the right bundle branch block.  Probably not enough to have symptoms of shortness of breath with routine activities, but will be something we want to pay attention to.  Not enough to Consider heart failure.  Bryan Lemma, MD

## 2020-12-11 NOTE — Telephone Encounter (Signed)
Left detail message on  secure voicemail per dpr,  keep appointment on 12/25/20. Any question may call back

## 2020-12-25 ENCOUNTER — Other Ambulatory Visit: Payer: Self-pay

## 2020-12-25 ENCOUNTER — Telehealth (INDEPENDENT_AMBULATORY_CARE_PROVIDER_SITE_OTHER): Payer: BC Managed Care – PPO | Admitting: Cardiology

## 2020-12-25 ENCOUNTER — Telehealth: Payer: Self-pay | Admitting: *Deleted

## 2020-12-25 ENCOUNTER — Encounter: Payer: Self-pay | Admitting: Cardiology

## 2020-12-25 VITALS — BP 142/80 | HR 69 | Ht 69.5 in | Wt 238.0 lb

## 2020-12-25 DIAGNOSIS — I519 Heart disease, unspecified: Secondary | ICD-10-CM | POA: Insufficient documentation

## 2020-12-25 DIAGNOSIS — I517 Cardiomegaly: Secondary | ICD-10-CM

## 2020-12-25 DIAGNOSIS — I10 Essential (primary) hypertension: Secondary | ICD-10-CM

## 2020-12-25 DIAGNOSIS — Z79899 Other long term (current) drug therapy: Secondary | ICD-10-CM

## 2020-12-25 DIAGNOSIS — I4510 Unspecified right bundle-branch block: Secondary | ICD-10-CM | POA: Diagnosis not present

## 2020-12-25 MED ORDER — CHLORTHALIDONE 25 MG PO TABS: 12.5000 mg | ORAL_TABLET | Freq: Every day | ORAL | 3 refills | Status: DC

## 2020-12-25 NOTE — Assessment & Plan Note (Signed)
Typically a benign finding, the question is could this be the reason for mildly reduced EF.

## 2020-12-25 NOTE — Patient Instructions (Addendum)
Medication Instructions:   Add Chlorthalidone 12.5 mg PO dailiy (Chlorthalidone 25 mg tab, take 1/2 tab by mouth daily, Disp #90 tabs, 3 refills  *If you need a refill on your cardiac medications before your next appointment, please call your pharmacy*   Lab Work:  BMP with Calcium & Mag ~ 2 weeks ( you do not need an  appointment if you use the [facility] at the office.  If you have labs (blood work) drawn today and your tests are completely normal, you will receive your results only by: MyChart Message (if you have MyChart) OR A paper copy in the mail If you have any lab test that is abnormal or we need to change your treatment, we will call you to review the results.   Testing/Procedures:  Coronary Calcium Score -Dr Herbie Baltimore  has ordered a CT coronary calcium score. This test is done at [address] [facility] [address]. This is $99 out of pocket.   Coronary CalciumScan A coronary calcium scan is an imaging test used to look for deposits of calcium and other fatty materials (plaques) in the inner lining of the blood vessels of the heart (coronary arteries). These deposits of calcium and plaques can partly clog and narrow the coronary arteries without producing any symptoms or warning signs. This puts a person at risk for a heart attack. This test can detect these deposits before symptoms develop. Tell a health care provider about: Any allergies you have. All medicines you are taking, including vitamins, herbs, eye drops, creams, and over-the-counter medicines. Any problems you or family members have had with anesthetic medicines. Any blood disorders you have. Any surgeries you have had. Any medical conditions you have. Whether you are pregnant or may be pregnant. What are the risks? Generally, this is a safe procedure. However, problems may occur, including: Harm to a pregnant woman and her unborn baby. This test involves the use of radiation. Radiation exposure can be dangerous to a  pregnant woman and her unborn baby. If you are pregnant, you generally should not have this procedure done. Slight increase in the risk of cancer. This is because of the radiation involved in the test. What happens before the procedure? No preparation is needed for this procedure. What happens during the procedure? You will undress and remove any jewelry around your neck or chest. You will put on a hospital gown. Sticky electrodes will be placed on your chest. The electrodes will be connected to an electrocardiogram (ECG) machine to record a tracing of the electrical activity of your heart. A CT scanner will take pictures of your heart. During this time, you will be asked to lie still and hold your breath for 2-3 seconds while a picture of your heart is being taken. The procedure may vary among health care providers and hospitals. What happens after the procedure? You can get dressed. You can return to your normal activities. It is up to you to get the results of your test. Ask your health care provider, or the department that is doing the test, when your results will be ready. Summary A coronary calcium scan is an imaging test used to look for deposits of calcium and other fatty materials (plaques) in the inner lining of the blood vessels of the heart (coronary arteries). Generally, this is a safe procedure. Tell your health care provider if you are pregnant or may be pregnant. No preparation is needed for this procedure. A CT scanner will take pictures of your heart.  You can return to your normal activities after the scan is done. This information is not intended to replace advice given to you by your health care provider. Make sure you discuss any questions you have with your health care provider. Document Released: 06/25/2007 Document Revised: 11/16/2015 Document Reviewed: 11/16/2015 Elsevier Interactive Patient Education  2017 [employer].    Follow-Up: At [facility], you and  your health needs are our priority.  As part of our continuing mission to provide you with exceptional heart care, we have created designated Provider Care Teams.  These Care Teams include your primary Cardiologist (physician) and Advanced Practice Providers (APPs -  Physician Assistants and Nurse Practitioners) who all work together to provide you with the care you need, when you need it.      Your next appointment:   4 month(s) April 2023  The format for your next appointment:   In Person  Provider:   Bryan Lemma, MD     Other Instructions HAPPY HOLIDAYS!!  MERRY CHRISTMAS!!

## 2020-12-25 NOTE — Progress Notes (Signed)
Virtual Visit via Video Note   This visit type was conducted due to national recommendations for restrictions regarding the COVID-19 Pandemic (e.g. social distancing) in an effort to limit this patient's exposure and mitigate transmission in our community.  Due to his co-morbid illnesses, this patient is at least at moderate risk for complications without adequate follow up.  This format is felt to be most appropriate for this patient at this time.  All issues noted in this document were discussed and addressed.  A limited physical exam was performed with this format.  Please refer to the patient's chart for his consent to telehealth for [facility].     Patient has given verbal permission to conduct this visit via virtual appointment and to bill insurance 12/25/2020 9:01 PM     Evaluation Performed:  Follow-up visit  Date:  12/25/2020   ID:  Randall [name], DOB January 22, 1955, MRN [id number]  Patient Location: Home Provider Location: Home Office  PCP:  Christain Sacramento, MD  Cardiologist:  Glenetta Hew, MD  Electrophysiologist:  None   Chief Complaint:   Chief Complaint  Patient presents with   Follow-up    Echocardiogram results.   Hypertension    Blood pressures are doing better, but not down to goal.  Averaging in the 0000000 to [id number] systolic.     ====================================  ASSESSMENT & PLAN:    Problem List Items Addressed This Visit       Cardiology Problems   Resistant hypertension (Chronic)    Blood pressure slowly getting better.  However still not yet at goal. He is on high-dose Micardis, carvedilol and amlodipine.  However he is not on a diuretic.  Would prefer to use a thiazide diuretic that has longer lasting effect-we will start with chlorthalidone 12.5 mg daily and plan to titrate up to 25 mg. Asked that he continue to follow his blood pressures.    Target systolic BP ranges [id number] to 130s.--Check chemistry panel shortly after starting medication.       Relevant Medications   chlorthalidone (HYGROTON) 25 MG tablet   Right bundle branch block - Primary    Typically a benign finding, the question is could this be the reason for mildly reduced EF.      Relevant Medications   chlorthalidone (HYGROTON) 25 MG tablet   Other Relevant Orders   CT CARDIAC SCORING (SELF PAY ONLY)   Basic metabolic panel   Magnesium   Left ventricular hypertrophy    Significant voltage on EKG, but no evidence of increased wall thickness on echo.  1 would think with his level of hypertension that this would be the case, but the echo did not show LVH.  Regardless, continue to treat hypertension.  EF did appear to be down, no CHF findings.      Relevant Medications   chlorthalidone (HYGROTON) 25 MG tablet   Other Relevant Orders   CT CARDIAC SCORING (SELF PAY ONLY)   Basic metabolic panel   Magnesium     Other   Decreased left ventricular systolic function    Mildly reduced EF 45 to 50% on echo with global HK.  At a minimum I think we need to look into the possibility of ischemic disease.  .  We will start off with a Coronary Calcium Score to assess baseline cardiovascular risk. If there is no evidence of ischemia/significant CAD, may want to consider other noninvasive testing such as CMR to look for rare cardiomyopathies.  Other Visit Diagnoses     Medication management       Relevant Orders   Basic metabolic panel   Magnesium       ====================================  History of Present Illness:    Randall [name] is an obese 65 y.o. male with PMH notable for Hypertension, DM-2, GERD with LVH on EKG who presents via audio/video conferencing for a telehealth visit today as a 1 month follow-up to discuss results of echocardiogram..  Randall [name] was seen 11/13/2020 at the request of Dr. Benedetto Goad for evaluation of hypertension and abnormal EKG-RBBB and LVH.  His father had LVH, and mother also had some kind of cardiac disease.   Mostly noted difficult control hypertension with pressures range in the 170/70-185/80s.  He may have noted some exertional dyspnea but no chest pain or pressure.  Prior to his right leg surgery(for quadriceps tendon repair), he had been very active.  Not yet fully recovered.  Leg is still in a brace. He was noted to have a soft systolic murmur on exam but otherwise benign findings.  Blood pressure was actually in the 130s.  Hospitalizations:  None   Recent - Interim CV studies:   The following studies were reviewed today: Echo 12/08/2020: EF 45 to 50%.  Mildly reduced function with global HK.  GR 1 DD with impaired relaxation.  Normal LV wall thickness with no evidence of LVH.  Normal RV size and function.  Normal atrial sizes.  Normal valves with exception of mild aortic sclerosis but no stenosis.  Mild MR  Inerval History   Randall [name] is being seen today via video conferencing.  He says that he is doing fairly well.  His blood pressures have been notably improved since I saw him.  Mostly averaging in the 140 to 150 mmHg range.  Has steadily come down, but still not yet at goal.  His energy level has improved but still gets tired little easily.  No real exertional dyspnea.  No chest pain or pressure.  Mild swelling but no PND or orthopnea.  Cardiovascular ROS: positive for - -mildly decreased energy/fatigue with exercise intolerance-mostly noted while walking upstairs..  Probably because of deconditioning since his knee surgery.  Trivial edema. negative for - chest pain, dyspnea on exertion, irregular heartbeat, orthopnea, palpitations, paroxysmal nocturnal dyspnea, rapid heart rate, shortness of breath, or lightheadedness, dizziness or wooziness, syncope lasting syncope or TIA/amaurosis fugax, claudication.   ROS:  Please see the history of present illness.     Review of Systems  Constitutional:  Positive for malaise/fatigue (Still seems to be a little bit lethargic and easily fatigued  following his surgery.  But getting back into shape.). Negative for weight loss (He has actually gained weight).  HENT:  Negative for nosebleeds.   Respiratory:  Negative for cough and shortness of breath.   Cardiovascular:  Positive for leg swelling (Mostly swelling at his surgical site, not really to pedal edema.).  Gastrointestinal:  Negative for abdominal pain, blood in stool and heartburn.  Genitourinary:  Negative for hematuria.  Musculoskeletal:  Positive for joint pain. Negative for falls and myalgias (Just normal aches and pains from his right leg surgery).  Neurological:  Negative for dizziness and weakness.  Psychiatric/Behavioral: Negative.     Past Medical History:  Diagnosis Date   Complication of anesthesia    Diabetes mellitus without complication (HCC)    GERD (gastroesophageal reflux disease)    Hypertension    PONV (postoperative nausea and vomiting)  Rupture of right quadriceps tendon 09/21/2020   - s/p srugical repair   Past Surgical History:  Procedure Laterality Date   KIDNEY SURGERY     KNEE SURGERY     LITHOTRIPSY     QUADRICEPS TENDON REPAIR Right 09/25/2020   Procedure: REPAIR RIGHT QUADRICEP TENDON;  Surgeon: Nadara Mustarduda, Marcus V, MD;  [facility];  Service: Orthopedics;  Laterality: Right;     Current Meds  Medication Sig   acetaminophen (TYLENOL) 500 MG tablet Take 1,000 mg by mouth every 6 (six) hours as needed for moderate pain or headache.   amLODipine (NORVASC) 10 MG tablet Take 10 mg by mouth daily. Take 1 tablet daily   aspirin EC 81 MG tablet Take 81 mg by mouth daily. Swallow whole.   atorvastatin (LIPITOR) 20 MG tablet Take 20 mg by mouth daily.   carvedilol (COREG) 25 MG tablet Take 25 mg by mouth 2 (two) times daily with a meal.   cetirizine (ZYRTEC) 10 MG tablet Take 10 mg by mouth daily as needed for allergies.   diphenhydrAMINE (BENADRYL) 25 MG tablet Take 25-50 mg by mouth every 6 (six) hours as needed for itching.   famotidine  (PEPCID) 20 MG tablet Take 20 mg by mouth 2 (two) times daily.   glimepiride (AMARYL) 4 MG tablet Take 4 mg by mouth in the morning and at bedtime.   glucose blood (FREESTYLE LITE) test strip Test glucose BID   Diagnoses Code: E11.9   Liniments (DEEP BLUE RELIEF EX) Apply 1 application topically daily as needed (pain).   meloxicam (MOBIC) 15 MG tablet Take 15 mg by mouth daily.   metFORMIN (GLUCOPHAGE-XR) 500 MG 24 hr tablet Take 1,000 mg by mouth 2 (two) times daily.   OVER THE COUNTER MEDICATION Take 2 capsules by mouth daily. xEO Mega supplement   OVER THE COUNTER MEDICATION Take 2 capsules by mouth daily. alpha crs plus supplement   OVER THE COUNTER MEDICATION Take 2 capsules by mouth daily. microplex vmz supplement   OVER THE COUNTER MEDICATION Take 2 tablets by mouth daily as needed (pain). Deep blue pain tablets   OVER THE COUNTER MEDICATION Take 2 capsules by mouth daily. Mito482max supplement   pantoprazole (PROTONIX) 40 MG tablet Take 40 mg by mouth daily.   potassium citrate (UROCIT-K) 10 MEQ (1080 MG) SR tablet Take 10 mEq by mouth 2 (two) times daily.   pseudoephedrine (SUDAFED) 120 MG 12 hr tablet Take 120 mg by mouth every 12 (twelve) hours as needed for congestion.   sitaGLIPtin (JANUVIA) 100 MG tablet Take 100 mg by mouth daily.   tadalafil (CIALIS) 20 MG tablet Take 20 mg by mouth daily as needed for erectile dysfunction.   telmisartan (MICARDIS) 80 MG tablet Take 80 mg by mouth daily.   triamcinolone cream (KENALOG) 0.1 % Apply 1 application topically 3 (three) times daily as needed for itching (rash).   TRULICITY 1.5 MG/0.5ML SOPN SMARTSIG:0.5 Milliliter(s) SUB-Q Once a Week     Allergies:   Norgesic forte [orphenadrine-aspirin-caffeine] and Latex   Social History   Tobacco Use   Smoking status: Never   Smokeless tobacco: Never  Vaping Use   Vaping Use: Never used  Substance Use Topics   Alcohol use: Never   Drug use: Never     Family Hx: The patient's family  history is not on file.   Labs/Other Tests and Data Reviewed:    EKG:  No ECG reviewed.  Recent Labs: 09/25/2020: BUN 20; Creatinine, Ser 0.68;  Hemoglobin 12.6; Platelets 168; Potassium 4.3; Sodium 134   Recent Lipid Panel No results found for: CHOL, TRIG, HDL, CHOLHDL, LDLCALC, LDLDIRECT  Wt Readings from Last 3 Encounters:  12/25/20 238 lb (108 kg)  11/13/20 239 lb 8 oz (108.6 kg)  09/25/20 230 lb (104.3 kg)     Objective:    Vital Signs:  BP (!) 142/80    Pulse 69    Ht 5' 9.5" (1.765 m)    Wt 238 lb (108 kg)    BMI 34.64 kg/m   VITAL SIGNS:  reviewed GEN:  no acute distress RESPIRATORY:  normal respiratory effort, symmetric expansion NEURO:  alert and oriented x 3, no obvious focal deficit PSYCH:  normal affect   ==========================================  COVID-19 Education: The signs and symptoms of COVID-19 were discussed with the patient and how to seek care for testing (follow up with PCP or arrange E-visit).   The importance of social distancing was discussed today.  Time:   Today, I have spent 20 minutes with the patient with telehealth technology discussing the above problems.   An additional 12 minutes spent charting (reviewing prior notes, hospital records, studies, labs etc.) Total 32 minutes   Medication Adjustments/Labs and Tests Ordered: Current medicines are reviewed at length with the patient today.  Concerns regarding medicines are outlined above.   Patient Instructions  Medication Instructions:   Add Chlorthalidone 12.5 mg PO dailiy (Chlorthalidone 25 mg tab, take 1/2 tab by mouth daily, Disp #90 tabs, 3 refills  *If you need a refill on your cardiac medications before your next appointment, please call your pharmacy*   Lab Work:  BMP with Calcium & Mag ~ 2 weeks ( you do not need an  appointment if you use the [facility] at the office.  If you have labs (blood work) drawn today and your tests are completely normal, you will receive your  results only by: MyChart Message (if you have MyChart) OR A paper copy in the mail If you have any lab test that is abnormal or we need to change your treatment, we will call you to review the results.   Testing/Procedures:  Coronary Calcium Score -Dr Herbie Baltimore  has ordered a CT coronary calcium score. This test is done at [address]. This is $99 out of pocket.   Coronary CalciumScan A coronary calcium scan is an imaging test used to look for deposits of calcium and other fatty materials (plaques) in the inner lining of the blood vessels of the heart (coronary arteries). These deposits of calcium and plaques can partly clog and narrow the coronary arteries without producing any symptoms or warning signs. This puts a person at risk for a heart attack. This test can detect these deposits before symptoms develop. Tell a health care provider about: Any allergies you have. All medicines you are taking, including vitamins, herbs, eye drops, creams, and over-the-counter medicines. Any problems you or family members have had with anesthetic medicines. Any blood disorders you have. Any surgeries you have had. Any medical conditions you have. Whether you are pregnant or may be pregnant. What are the risks? Generally, this is a safe procedure. However, problems may occur, including: Harm to a pregnant woman and her unborn baby. This test involves the use of radiation. Radiation exposure can be dangerous to a pregnant woman and her unborn baby. If you are pregnant, you generally should not have this procedure done. Slight increase in the risk of cancer. This is because  of the radiation involved in the test. What happens before the procedure? No preparation is needed for this procedure. What happens during the procedure? You will undress and remove any jewelry around your neck or chest. You will put on a hospital gown. Sticky electrodes will be placed on your chest. The electrodes  will be connected to an electrocardiogram (ECG) machine to record a tracing of the electrical activity of your heart. A CT scanner will take pictures of your heart. During this time, you will be asked to lie still and hold your breath for 2-3 seconds while a picture of your heart is being taken. The procedure may vary among health care providers and hospitals. What happens after the procedure? You can get dressed. You can return to your normal activities. It is up to you to get the results of your test. Ask your health care provider, or the department that is doing the test, when your results will be ready. Summary A coronary calcium scan is an imaging test used to look for deposits of calcium and other fatty materials (plaques) in the inner lining of the blood vessels of the heart (coronary arteries). Generally, this is a safe procedure. Tell your health care provider if you are pregnant or may be pregnant. No preparation is needed for this procedure. A CT scanner will take pictures of your heart. You can return to your normal activities after the scan is done. This information is not intended to replace advice given to you by your health care provider. Make sure you discuss any questions you have with your health care provider. Document Released: 06/25/2007 Document Revised: 11/16/2015 Document Reviewed: 11/16/2015 Elsevier Interactive Patient Education  2017 [place]: At [facility], you and your health needs are our priority.  As part of our continuing mission to provide you with exceptional heart care, we have created designated Provider Care Teams.  These Care Teams include your primary Cardiologist (physician) and Advanced Practice Providers (APPs -  Physician Assistants and Nurse Practitioners) who all work together to provide you with the care you need, when you need it.      Your next appointment:   4 month(s) April 2023  The format for your next appointment:    In Person  Provider:   Glenetta Hew, MD     Other Instructions HAPPY HOLIDAYS!!  MERRY CHRISTMAS!!   Signed, Glenetta Hew, MD  12/25/2020 9:01 PM    [facility]

## 2020-12-25 NOTE — Assessment & Plan Note (Addendum)
Blood pressure slowly getting better.  However still not yet at goal. He is on high-dose Micardis, carvedilol and amlodipine.  However he is not on a diuretic.  Would prefer to use a thiazide diuretic that has longer lasting effect-we will start with chlorthalidone 12.5 mg daily and plan to titrate up to 25 mg. Asked that he continue to follow his blood pressures.    Target systolic BP ranges 120s to 130s.--Check chemistry panel shortly after starting medication.

## 2020-12-25 NOTE — Telephone Encounter (Signed)
°  Patient Consent for Virtual Visit        Randall [name] has provided verbal consent on 12/25/2020 for a virtual visit (video or telephone).   CONSENT FOR VIRTUAL VISIT FOR:  Randall [name]  By participating in this virtual visit I agree to the following:  I hereby voluntarily request, consent and authorize [facility] and its employed or contracted physicians, physician assistants, nurse practitioners or other licensed health care professionals (the Practitioner), to provide me with telemedicine health care services (the Services") as deemed necessary by the treating Practitioner. I acknowledge and consent to receive the Services by the Practitioner via telemedicine. I understand that the telemedicine visit will involve communicating with the Practitioner through live audiovisual communication technology and the disclosure of certain medical information by electronic transmission. I acknowledge that I have been given the opportunity to request an in-person assessment or other available alternative prior to the telemedicine visit and am voluntarily participating in the telemedicine visit.  I understand that I have the right to withhold or withdraw my consent to the use of telemedicine in the course of my care at any time, without affecting my right to future care or treatment, and that the Practitioner or I may terminate the telemedicine visit at any time. I understand that I have the right to inspect all information obtained and/or recorded in the course of the telemedicine visit and may receive copies of available information for a reasonable fee.  I understand that some of the potential risks of receiving the Services via telemedicine include:  Delay or interruption in medical evaluation due to technological equipment failure or disruption; Information transmitted may not be sufficient (e.g. poor resolution of images) to allow for appropriate medical decision making by the Practitioner;  and/or  In rare instances, security protocols could fail, causing a breach of personal health information.  Furthermore, I acknowledge that it is my responsibility to provide information about my medical history, conditions and care that is complete and accurate to the best of my ability. I acknowledge that Practitioner's advice, recommendations, and/or decision may be based on factors not within their control, such as incomplete or inaccurate data provided by me or distortions of diagnostic images or specimens that may result from electronic transmissions. I understand that the practice of medicine is not an exact science and that Practitioner makes no warranties or guarantees regarding treatment outcomes. I acknowledge that a copy of this consent can be made available to me via my patient portal [facility] MyChart), or I can request a printed copy by calling the office of [facility].    I understand that my insurance will be billed for this visit.   I have read or had this consent read to me. I understand the contents of this consent, which adequately explains the benefits and risks of the Services being provided via telemedicine.  I have been provided ample opportunity to ask questions regarding this consent and the Services and have had my questions answered to my satisfaction. I give my informed consent for the services to be provided through the use of telemedicine in my medical care

## 2020-12-25 NOTE — Assessment & Plan Note (Signed)
Mildly reduced EF 45 to 50% on echo with global HK.  At a minimum I think we need to look into the possibility of ischemic disease.  .  We will start off with a Coronary Calcium Score to assess baseline cardiovascular risk. If there is no evidence of ischemia/significant CAD, may want to consider other noninvasive testing such as CMR to look for rare cardiomyopathies.

## 2020-12-25 NOTE — Assessment & Plan Note (Signed)
Significant voltage on EKG, but no evidence of increased wall thickness on echo.  1 would think with his level of hypertension that this would be the case, but the echo did not show LVH.  Regardless, continue to treat hypertension.  EF did appear to be down, no CHF findings.

## 2020-12-25 NOTE — Telephone Encounter (Signed)
RN spoke to patient. Instruction were given  from today's virtual visit 12/25/20 .  AVS SUMMARY has been sent by mychart. Medication e-scribed to pharmacy. Labs have been mailed   Follow up appointment schedule   Patient verbalized understanding

## 2021-01-26 DIAGNOSIS — I700 Atherosclerosis of aorta: Secondary | ICD-10-CM | POA: Insufficient documentation

## 2021-01-26 DIAGNOSIS — R931 Abnormal findings on diagnostic imaging of heart and coronary circulation: Secondary | ICD-10-CM | POA: Insufficient documentation

## 2021-02-02 ENCOUNTER — Other Ambulatory Visit: Payer: Self-pay

## 2021-02-02 ENCOUNTER — Ambulatory Visit (INDEPENDENT_AMBULATORY_CARE_PROVIDER_SITE_OTHER)
Admission: RE | Admit: 2021-02-02 | Discharge: 2021-02-02 | Disposition: A | Discharge status: Home / Self Care | Payer: Self-pay | Source: Ambulatory Visit | Attending: Cardiology | Admitting: Cardiology

## 2021-02-02 DIAGNOSIS — R931 Abnormal findings on diagnostic imaging of heart and coronary circulation: Secondary | ICD-10-CM

## 2021-02-02 DIAGNOSIS — I4510 Unspecified right bundle-branch block: Secondary | ICD-10-CM

## 2021-02-02 DIAGNOSIS — I517 Cardiomegaly: Secondary | ICD-10-CM

## 2021-02-02 HISTORY — DX: Abnormal findings on diagnostic imaging of heart and coronary circulation: R93.1

## 2021-02-03 LAB — BASIC METABOLIC PANEL
BUN/Creatinine Ratio: 13 (ref 10–24)
BUN: 12 mg/dL (ref 8–27)
CO2: 25 mmol/L (ref 20–29)
Calcium: 9.4 mg/dL (ref 8.6–10.2)
Chloride: 98 mmol/L (ref 96–106)
Creatinine, Ser: 0.93 mg/dL (ref 0.76–1.27)
Glucose: 193 mg/dL — ABNORMAL HIGH (ref 70–99)
Potassium: 4.2 mmol/L (ref 3.5–5.2)
Sodium: 141 mmol/L (ref 134–144)
eGFR: 91 mL/min/{1.73_m2} (ref >59)

## 2021-02-03 LAB — MAGNESIUM: Magnesium: 1.8 mg/dL (ref 1.6–2.3)

## 2021-04-26 ENCOUNTER — Ambulatory Visit: Payer: Medicare HMO | Admitting: Cardiology

## 2021-04-26 ENCOUNTER — Encounter: Payer: Self-pay | Admitting: Cardiology

## 2021-04-26 VITALS — BP 154/82 | HR 71 | Ht 68.0 in | Wt 230.6 lb

## 2021-04-26 DIAGNOSIS — I519 Heart disease, unspecified: Secondary | ICD-10-CM

## 2021-04-26 DIAGNOSIS — I4510 Unspecified right bundle-branch block: Secondary | ICD-10-CM

## 2021-04-26 DIAGNOSIS — R931 Abnormal findings on diagnostic imaging of heart and coronary circulation: Secondary | ICD-10-CM | POA: Diagnosis not present

## 2021-04-26 DIAGNOSIS — I1A0 Resistant hypertension: Secondary | ICD-10-CM

## 2021-04-26 DIAGNOSIS — I110 Hypertensive heart disease with heart failure: Secondary | ICD-10-CM

## 2021-04-26 DIAGNOSIS — I10 Essential (primary) hypertension: Secondary | ICD-10-CM

## 2021-04-26 DIAGNOSIS — I700 Atherosclerosis of aorta: Secondary | ICD-10-CM

## 2021-04-26 DIAGNOSIS — I5032 Chronic diastolic (congestive) heart failure: Secondary | ICD-10-CM

## 2021-04-26 NOTE — Progress Notes (Signed)
Primary Care Provider: Barbie Banner, MD Cardiologist: Bryan Lemma, MD Electrophysiologist: None  Clinic Note: Chief Complaint  Patient presents with   Follow-up    Coronary Calcium Score results; feeling well.  Exercising 3 days a week with no chest pain/pressure or dyspnea.   ===================================  ASSESSMENT/PLAN   Problem List Items Addressed This Visit       Cardiology Problems   Resistant hypertension (Chronic)    Blood pressure seems to be better at home although still high today.  He is already on max dose of carvedilol, telmisartan, and amlodipine along with the addition now of chlorthalidone.  Would recommend increasing chlorthalidone to 25 mg if pressures are elevated consistent with today.  Next choice would then be to add spironolactone. Otherwise continue current meds.       Hypertensive heart disease with chronic diastolic congestive heart failure (HCC) - Primary (Chronic)    Although there is significant voltage on EKG, the echo did not show significantly increased LV thickness.  Continue blood pressure control.  Neck steps would be to increase chlorthalidone to 25 mg 80 to potentially give 50 mg as long as electrolytes are stable.  Other remaining option for treatment would be to add spironolactone.       Elevated coronary artery calcium score (Chronic)    Coronary Calcium Score is definitely elevated, but he remains asymptomatic with no angina or dyspnea with rest or exertion.  As such, the recommendation will be to simply continue aggressive risk factor modification.  Monitor for signs or symptoms of angina or significant arrhythmia. Target LDL should be less than 70, A1c less than 6 and systolic blood pressure ranges in the 120 to 130 mmHg range.  We will reassess in 6 months, if pressures are still elevated neck step would be to increase chlorthalidone dose to 25 mg and potentially add spironolactone.  Would also like to see his  labs prior to follow-up visit.  If there is any inclination of any exertional dyspnea or chest tightness, would have a low threshold to continue with more detailed ischemic evaluation.       Thoracic aortic atherosclerosis (HCC) (Chronic)    Seen on Coronary Calcium Score.  Heightens the need for aggressive respect modification with pressure, glycemic and lipid control.  I do not have any labs to review, but his blood pressure seems to be borderline-but better controlled at home than here.       Right bundle branch block (Chronic)    Relatively benign finding.  No active symptoms.  EF low normal to mildly reduced on echo-could be related to bundle branch related wall motion abnormality.         Other   Decreased left ventricular systolic function (Chronic)    Mildly reduced EF with global HK would probably assist suggest a global phenomenon which would most likely be related to hypertension.  Continue to titrate antihypertensives.  If there is no evidence of any heart failure or anginal symptoms, would hold off on further evaluation for now.       ===================================  HPI:    Randall [name] is a 66 y.o. male with a PMH notable for Hypertensive Heart Disease (EF 45 to 50% with diastolic dysfunction), DM-2 and RBBB who presents today for 9-month follow-up.  He is being seen today at the request of Barbie Banner, MD for assistance with Hypertension Management.  Randall [name] was last seen on 12/25/2020 via telemedicine following initial consultation in  November 2022 => 2D echo ordered..  BP not yet at goal-SBP averaging more than 140 over 3 mmHg range, steadily coming down.  Denied any chest pain or pressure at rest or exertion.  Energy better just easily fatigued.  Mild edema but no PND orthopnea.  Deconditioning due to recent knee surgery. -> Added chlorthalidone 12.5 mg daily; labs rechecked; Coronary Calcium Score ordered  Recent Hospitalizations:  none  Reviewed  CV studies:    The following studies were reviewed today: (if available, images/films reviewed: From Epic Chart or Care Everywhere) Coronary Calcium Score 02/02/2021: Total score 683.  42 LAD, 94 LCx, 106 RCA.  Aortic atherosclerosis  Interval History:   Randall [name] returns here today for follow-up stating he is doing pretty well.  He says his home blood pressure readings range anywhere from 128-140s/70s.  Today is little high but he has not yet taken his medications and rush to get in here.  His last A1c checked by PCP was 6.0.  He finally was able to recover from his quadriceps injury after an extensive amount of time with rehab.  He is now doing 3 days at the gym and walking routinely.  He is not having any symptoms of chest tightness or pressure at rest or exertion.  No exertional dyspnea, unless he pushes himself too hard. He denies any PND, orthopnea.  Edema seems much better controlled with chlorthalidone. .  No rapid irregular heartbeats palpitations.  No syncope or near syncope, TIA or amaurosis fugax.  No claudication.  REVIEWED OF SYSTEMS   Review of Systems  Constitutional:  Positive for weight loss (About 8 pounds). Negative for malaise/fatigue (Energy level is gradually coming back.  He is now able to tolerate his exercises.).  HENT:  Negative for congestion and nosebleeds.   Respiratory:  Negative for cough and shortness of breath.   Cardiovascular:        Per HPI  Gastrointestinal:  Negative for blood in stool and melena.  Genitourinary:  Negative for hematuria.  Musculoskeletal:  Positive for joint pain (R leg/knee still hurts when he overdoes it -but getting better.). Negative for back pain.  Neurological:  Negative for dizziness, focal weakness and weakness.  Psychiatric/Behavioral: Negative.     I have reviewed and (if needed) personally updated the patient's problem list, medications, allergies, past medical and surgical history, social and family  history.   PAST MEDICAL HISTORY   Past Medical History:  Diagnosis Date   Complication of anesthesia    Diabetes mellitus without complication (HCC)    GERD (gastroesophageal reflux disease)    Hypertension    PONV (postoperative nausea and vomiting)    Rupture of right quadriceps tendon 09/21/2020   - s/p srugical repair    PAST SURGICAL HISTORY   Past Surgical History:  Procedure Laterality Date   KIDNEY SURGERY     KNEE SURGERY     LITHOTRIPSY     QUADRICEPS TENDON REPAIR Right 09/25/2020   Procedure: REPAIR RIGHT QUADRICEP TENDON;  Surgeon: Nadara Mustard, MD;  Location: [facility];  Service: Orthopedics;  Laterality: Right;   Echo 12/08/2020: EF 45 to 50%.  Mildly reduced function with global HK.  GR 1 DD with impaired relaxation.  Normal LV wall thickness with no evidence of LVH.  Normal RV size and function.  Normal atrial sizes.  Normal valves with exception of mild aortic sclerosis but no stenosis.  Mild MR  Immunization History  Administered Date(s) Administered   PFIZER Comirnaty(Gray Top)Covid-19  Tri-Sucrose Vaccine 04/19/2020   Pfizer Covid-19 Vaccine Bivalent Booster 65yrs & up 10/22/2020   Tdap 08/18/2018    MEDICATIONS/ALLERGIES   Current Meds  Medication Sig   acetaminophen (TYLENOL) 500 MG tablet Take 1,000 mg by mouth every 6 (six) hours as needed for moderate pain or headache.   amLODipine (NORVASC) 10 MG tablet Take 10 mg by mouth daily. Take 1 tablet daily   aspirin EC 81 MG tablet Take 81 mg by mouth daily. Swallow whole.   carvedilol (COREG) 25 MG tablet Take 25 mg by mouth 2 (two) times daily with a meal.   cetirizine (ZYRTEC) 10 MG tablet Take 10 mg by mouth daily as needed for allergies.   chlorthalidone (HYGROTON) 25 MG tablet Take 0.5 tablets (12.5 mg total) by mouth daily.   glimepiride (AMARYL) 4 MG tablet Take 4 mg by mouth daily in the afternoon.   glucose blood (FREESTYLE LITE) test strip Test glucose BID   Diagnoses Code: E11.9   meloxicam  (MOBIC) 15 MG tablet Take 15 mg by mouth daily.   metFORMIN (GLUCOPHAGE-XR) 500 MG 24 hr tablet Take 1,000 mg by mouth 2 (two) times daily.   OVER THE COUNTER MEDICATION Take 2 capsules by mouth daily. xEO Mega supplement   OVER THE COUNTER MEDICATION Take 2 capsules by mouth daily. alpha crs plus supplement   OVER THE COUNTER MEDICATION Take 2 capsules by mouth daily. microplex vmz supplement   pantoprazole (PROTONIX) 40 MG tablet Take 40 mg by mouth daily.   potassium citrate (UROCIT-K) 10 MEQ (1080 MG) SR tablet Take 10 mEq by mouth 2 (two) times daily.   sitaGLIPtin (JANUVIA) 100 MG tablet Take 100 mg by mouth daily.   tadalafil (CIALIS) 20 MG tablet Take 20 mg by mouth daily as needed for erectile dysfunction.   telmisartan (MICARDIS) 80 MG tablet Take 80 mg by mouth daily.   triamcinolone cream (KENALOG) 0.1 % Apply 1 application topically 3 (three) times daily as needed for itching (rash).   TRULICITY 1.5 MG/0.5ML SOPN SMARTSIG:0.5 Milliliter(s) SUB-Q Once a Week    Allergies  Allergen Reactions   Norgesic Forte [Orphenadrine-Aspirin-Caffeine]     Unknown reaction    Latex Hives and Rash    Blisters    SOCIAL HISTORY/FAMILY HISTORY   Reviewed in Epic:  Pertinent findings:  Social History   Tobacco Use   Smoking status: Never   Smokeless tobacco: Never  Vaping Use   Vaping Use: Never used  Substance Use Topics   Alcohol use: Never   Drug use: Never   Social History   Social History Narrative   Usually very active - but no "routine exercise".    OBJCTIVE -PE, EKG, labs   Wt Readings from Last 3 Encounters:  04/26/21 230 lb 9.6 oz (104.6 kg)  12/25/20 238 lb (108 kg)  11/13/20 239 lb 8 oz (108.6 kg)   Physical Exam: BP (!) 154/82 (BP Location: Left Arm, Patient Position: Sitting, Cuff Size: Large)   Pulse 71   Ht 5\' 8"  (1.727 m)   Wt 230 lb 9.6 oz (104.6 kg)   SpO2 98%   BMI 35.06 kg/m  -> Recheck BP 138/78 mmHg Physical Exam Vitals reviewed.   Constitutional:      General: He is not in acute distress.    Appearance: Normal appearance. He is obese. He is not ill-appearing or toxic-appearing.     Comments: Well-groomed.  Other than obese, healthy appearing.  HENT:     Head: Normocephalic  and atraumatic.  Neck:     Vascular: No carotid bruit or JVD.  Cardiovascular:     Rate and Rhythm: Normal rate and regular rhythm. No extrasystoles are present.    Chest Wall: PMI is not displaced.     Pulses: Normal pulses.     Heart sounds: S1 normal and S2 normal. Heart sounds are distant. No murmur heard.   No gallop.  Pulmonary:     Effort: Pulmonary effort is normal. No respiratory distress.     Breath sounds: Normal breath sounds. No wheezing, rhonchi or rales.  Musculoskeletal:        General: No swelling.     Cervical back: Normal range of motion and neck supple.  Skin:    General: Skin is warm and dry.  Neurological:     General: No focal deficit present.     Mental Status: He is alert and oriented to person, place, and time.     Gait: Gait normal.  Psychiatric:        Mood and Affect: Mood normal.        Behavior: Behavior normal.        Thought Content: Thought content normal.        Judgment: Judgment normal.     Adult ECG Report N/a  Recent Labs: Reviewed No results found for: CHOL, HDL, LDLCALC, LDLDIRECT, TRIG, CHOLHDL -> his labs were checked that his annual physical with his PCP -> Per report, labs look "good ".  Unfortunate do not have the lab results. Lab Results  Component Value Date   CREATININE 0.93 02/02/2021   BUN 12 02/02/2021   NA 141 02/02/2021   K 4.2 02/02/2021   CL 98 02/02/2021   CO2 25 02/02/2021      Latest Ref Rng & Units 09/25/2020   11:03 AM 03/07/2010    5:15 AM 03/06/2010    5:40 AM  CBC  WBC 4.0 - 10.5 K/uL 8.3   11.5   16.2    Hemoglobin 13.0 - 17.0 g/dL 16.1   09.6   04.5    Hematocrit 39.0 - 52.0 % 38.6   35.6   36.2    Platelets 150 - 400 K/uL 168   133   115      Hemoglobin A1c per his report was 6.0  No results found for: TSH  ==================================================  COVID-19 Education: The signs and symptoms of COVID-19 were discussed with the patient and how to seek care for testing (follow up with PCP or arrange E-visit).    I spent a total of 23 minutes with the patient spent in direct patient consultation.  Additional time spent with chart review  / charting (studies, outside notes, etc): 20 min Total Time: 43 min  Current medicines are reviewed at length with the patient today.  (+/- concerns) N/A  This visit occurred during the SARS-CoV-2 public health emergency.  Safety protocols were in place, including screening questions prior to the visit, additional usage of staff PPE, and extensive cleaning of exam room while observing appropriate contact time as indicated for disinfecting solutions.  Notice: This dictation was prepared with Dragon dictation along with smart phrase technology. Any transcriptional errors that result from this process are unintentional and may not be corrected upon review.  Studies Ordered:   No orders of the defined types were placed in this encounter. No orders of the defined types were placed in this encounter.   Patient Instructions / Medication Changes & Studies & Tests  Ordered   Patient Instructions  Medication Instructions:   No changes *If you need a refill on your cardiac medications before your next appointment, please call your pharmacy*   Lab Work: Not needed    Testing/Procedures:  Not needed  Follow-Up: At [facility], you and your health needs are our priority.  As part of our continuing mission to provide you with exceptional heart care, we have created designated Provider Care Teams.  These Care Teams include your primary Cardiologist (physician) and Advanced Practice Providers (APPs -  Physician Assistants and Nurse Practitioners) who all work together to provide you  with the care you need, when you need it.     Your next appointment:   6 month(s)  The format for your next appointment:   In Person  Provider:   Bryan Lemmaavid Mirca Yale, MD       Bryan Lemmaavid Kaloni Bisaillon, M.D., M.S. Interventional Cardiologist   Pager # [phone] Phone # [phone] [address] [place], [place] [address]   Thank you for choosing [facility] at [facility]!!

## 2021-04-26 NOTE — Patient Instructions (Addendum)

## 2021-05-30 ENCOUNTER — Encounter: Payer: Self-pay | Admitting: Cardiology

## 2021-05-30 NOTE — Assessment & Plan Note (Signed)
Mildly reduced EF with global HK would probably assist suggest a global phenomenon which would most likely be related to hypertension.  Continue to titrate antihypertensives.  If there is no evidence of any heart failure or anginal symptoms, would hold off on further evaluation for now.

## 2021-05-30 NOTE — Assessment & Plan Note (Addendum)
Blood pressure seems to be better at home although still high today.  He is already on max dose of carvedilol, telmisartan, and amlodipine along with the addition now of chlorthalidone.  Would recommend increasing chlorthalidone to 25 mg if pressures are elevated consistent with today.  Next choice would then be to add spironolactone. Otherwise continue current meds.

## 2021-05-30 NOTE — Assessment & Plan Note (Signed)
Seen on Coronary Calcium Score.  Heightens the need for aggressive respect modification with pressure, glycemic and lipid control.  I do not have any labs to review, but his blood pressure seems to be borderline-but better controlled at home than here.

## 2021-05-30 NOTE — Assessment & Plan Note (Addendum)
Coronary Calcium Score is definitely elevated, but he remains asymptomatic with no angina or dyspnea with rest or exertion.  As such, the recommendation will be to simply continue aggressive risk factor modification.  Monitor for signs or symptoms of angina or significant arrhythmia. Target LDL should be less than 70, A1c less than 6 and systolic blood pressure ranges in the 120 to 130 mmHg range.  We will reassess in 6 months, if pressures are still elevated neck step would be to increase chlorthalidone dose to 25 mg and potentially add spironolactone.  Would also like to see his labs prior to follow-up visit.  If there is any inclination of any exertional dyspnea or chest tightness, would have a low threshold to continue with more detailed ischemic evaluation.

## 2021-05-30 NOTE — Assessment & Plan Note (Signed)
Although there is significant voltage on EKG, the echo did not show significantly increased LV thickness.  Continue blood pressure control.  Neck steps would be to increase chlorthalidone to 25 mg 80 to potentially give 50 mg as long as electrolytes are stable.  Other remaining option for treatment would be to add spironolactone.

## 2021-05-30 NOTE — Assessment & Plan Note (Signed)
Relatively benign finding.  No active symptoms.  EF low normal to mildly reduced on echo-could be related to bundle branch related wall motion abnormality.

## 2021-11-07 ENCOUNTER — Other Ambulatory Visit: Payer: Self-pay | Admitting: Cardiology

## 2022-02-18 ENCOUNTER — Encounter: Payer: Self-pay | Admitting: Cardiology

## 2022-02-18 ENCOUNTER — Ambulatory Visit: Payer: Medicare HMO | Attending: Cardiology | Admitting: Cardiology

## 2022-02-18 VITALS — BP 140/72 | HR 60 | Ht 69.0 in | Wt 206.8 lb

## 2022-02-18 DIAGNOSIS — E785 Hyperlipidemia, unspecified: Secondary | ICD-10-CM | POA: Diagnosis not present

## 2022-02-18 DIAGNOSIS — R931 Abnormal findings on diagnostic imaging of heart and coronary circulation: Secondary | ICD-10-CM | POA: Diagnosis not present

## 2022-02-18 DIAGNOSIS — I1A0 Resistant hypertension: Secondary | ICD-10-CM | POA: Diagnosis not present

## 2022-02-18 DIAGNOSIS — I5032 Chronic diastolic (congestive) heart failure: Secondary | ICD-10-CM

## 2022-02-18 DIAGNOSIS — I110 Hypertensive heart disease with heart failure: Secondary | ICD-10-CM

## 2022-02-18 NOTE — Progress Notes (Unsigned)
Primary Care Provider: Christain Sacramento, MD [place] Cardiologist: Glenetta Hew, MD Electrophysiologist: None  Clinic Note: No chief complaint on file.   ===================================  ASSESSMENT/PLAN   Problem List Items Addressed This Visit   None   ===================================  HPI:    Randall [name] is a 67 y.o. male with a PMH notable for Resistant Hypertension with Hypertensive Heart Disease-HFmrEF, elevated Coronary Calcium Score below who presents today for ***.  PMH: Hypertensive Heart Disease (EF 45 to 83% with diastolic dysfunction), DM-2 and RBBB   Randall [name] was last seen on 04/26/2021  Recent Hospitalizations: ***  Reviewed  CV studies:    The following studies were reviewed today: (if available, images/films reviewed: From Epic Chart or Care Everywhere) ***:  Interval History:   Randall [name]   CV Review of Symptoms (Summary): Cardiovascular ROS: {roscv:310661}  REVIEWED OF SYSTEMS   ROS  I have reviewed and (if needed) personally updated the patient's problem list, medications, allergies, past medical and surgical history, social and family history.   PAST MEDICAL HISTORY   Past Medical History:  Diagnosis Date   Complication of anesthesia    Diabetes mellitus without complication (HCC)    GERD (gastroesophageal reflux disease)    Hypertension    PONV (postoperative nausea and vomiting)    Rupture of right quadriceps tendon 09/21/2020   - s/p srugical repair    PAST SURGICAL HISTORY   Past Surgical History:  Procedure Laterality Date   KIDNEY SURGERY     KNEE SURGERY     LITHOTRIPSY     QUADRICEPS TENDON REPAIR Right 09/25/2020   Procedure: REPAIR RIGHT QUADRICEP TENDON;  Surgeon: Newt Minion, MD;  Location: [place];  Service: Orthopedics;  Laterality: Right;    MEDICATIONS/ALLERGIES   Current Meds  Medication Sig   acetaminophen (TYLENOL) 500 MG tablet Take 1,000 mg by mouth every 6 (six)  hours as needed for moderate pain or headache.   amLODipine (NORVASC) 10 MG tablet Take 10 mg by mouth daily. Take 1 tablet daily   aspirin EC 81 MG tablet Take 81 mg by mouth daily. Swallow whole.   atorvastatin (LIPITOR) 20 MG tablet Take 20 mg by mouth daily.   carvedilol (COREG) 25 MG tablet Take 25 mg by mouth 2 (two) times daily with a meal.   cetirizine (ZYRTEC) 10 MG tablet Take 10 mg by mouth daily as needed for allergies.   chlorthalidone (HYGROTON) 25 MG tablet TAKE 1/2 TABLET BY MOUTH EVERY DAY   famotidine (PEPCID) 20 MG tablet Take 20 mg by mouth 2 (two) times daily.   glimepiride (AMARYL) 4 MG tablet Take 4 mg by mouth daily with breakfast.   glucose blood (FREESTYLE LITE) test strip Test glucose BID   Diagnoses Code: E11.9   meloxicam (MOBIC) 15 MG tablet Take 15 mg by mouth daily.   metFORMIN (GLUCOPHAGE-XR) 500 MG 24 hr tablet Take 1,000 mg by mouth 2 (two) times daily.   OVER THE COUNTER MEDICATION Take 2 capsules by mouth daily. xEO Mega supplement   OVER THE COUNTER MEDICATION Take 2 capsules by mouth daily. alpha crs plus supplement   OVER THE COUNTER MEDICATION Take 2 capsules by mouth daily. microplex vmz supplement   pantoprazole (PROTONIX) 40 MG tablet Take 40 mg by mouth daily.   potassium citrate (UROCIT-K) 10 MEQ (1080 MG) SR tablet Take 10 mEq by mouth 2 (two) times daily.   sitaGLIPtin (JANUVIA) 100 MG tablet Take 100 mg  by mouth daily.   tadalafil (CIALIS) 20 MG tablet Take 20 mg by mouth daily as needed for erectile dysfunction.   telmisartan (MICARDIS) 80 MG tablet Take 80 mg by mouth daily.   triamcinolone cream (KENALOG) 0.1 % Apply 1 application topically 3 (three) times daily as needed for itching (rash).   TRULICITY 1.5 WL/7.9GX SOPN SMARTSIG:0.5 Milliliter(s) SUB-Q Once a Week    SOCIAL HISTORY/FAMILY HISTORY   Reviewed in Epic:  Pertinent findings:  Social History   Tobacco Use   Smoking status: Never   Smokeless tobacco: Never  Vaping Use    Vaping Use: Never used  Substance Use Topics   Alcohol use: Never   Drug use: Never   Social History   Social History Narrative   Usually very active - but no "routine exercise".  - goes to [facility] - 3-4 d/ week.  Bike TM & mild weightrs.  OBJCTIVE -PE, EKG, labs   Wt Readings from Last 3 Encounters:  02/18/22 206 lb 12.8 oz (93.8 kg)  04/26/21 230 lb 9.6 oz (104.6 kg)  12/25/20 238 lb (108 kg)    Physical Exam: BP (!) 140/72   Pulse 60   Ht 5\' 9"  (1.753 m)   Wt 206 lb 12.8 oz (93.8 kg)   SpO2 99%   BMI 30.54 kg/m  Physical Exam   Adult ECG Report  Rate: *** ;  Rhythm: {rhythm:17366};   Narrative Interpretation: ***  Recent Labs:    5 mo ago -- due for recheck soon.  Had accidentally been off of statin  LDL Direct 138 High  -=- had accidentally been of of statin.   Total Cholesterol 239 High   Triglycerides 286 High   HDL Cholesterol 39     No results found for: "CHOL", "HDL", "LDLCALC", "LDLDIRECT", "TRIG", "CHOLHDL" Lab Results  Component Value Date   CREATININE 0.93 02/02/2021   BUN 12 02/02/2021   NA 141 02/02/2021   K 4.2 02/02/2021   CL 98 02/02/2021   CO2 25 02/02/2021      Latest Ref Rng & Units 09/25/2020   11:03 AM 03/07/2010    5:15 AM 03/06/2010    5:40 AM  CBC  WBC 4.0 - 10.5 K/uL 8.3  11.5  16.2   Hemoglobin 13.0 - 17.0 g/dL 12.6  11.5  11.7   Hematocrit 39.0 - 52.0 % 38.6  35.6  36.2   Platelets 150 - 400 K/uL 168  133  115    No results found for: "TSH"  ================================================== I spent a total of ***minutes with the patient spent in direct patient consultation.  Additional time spent with chart review  / charting (studies, outside notes, etc): *** min Total Time: *** min  Current medicines are reviewed at length with the patient today.  (+/- concerns) ***  Notice: This dictation was prepared with Dragon dictation along with smart phrase technology. Any transcriptional errors that result from this process are  unintentional and may not be corrected upon review.  Studies Ordered:   No orders of the defined types were placed in this encounter.  No orders of the defined types were placed in this encounter.   Patient Instructions / Medication Changes & Studies & Tests Ordered   There are no Patient Instructions on file for this visit.     Leonie Man, MD, MS Glenetta Hew, M.D., M.S. Interventional Cardiologist  [place]  Pager # [phone] Phone # [phone] [address] [place], [place] [address]  Thank you for choosing [place] [facility] [place]!!

## 2022-02-18 NOTE — Patient Instructions (Signed)

## 2022-02-20 ENCOUNTER — Ambulatory Visit: Payer: Medicare HMO | Admitting: Radiation Oncology

## 2022-02-21 ENCOUNTER — Encounter: Payer: Self-pay | Admitting: Cardiology

## 2022-02-21 DIAGNOSIS — E785 Hyperlipidemia, unspecified: Secondary | ICD-10-CM | POA: Insufficient documentation

## 2022-02-21 NOTE — Assessment & Plan Note (Signed)
No active heart failure symptoms.  On multiple medications as listed.  Pressure looks pretty stable.  If pressures do trend up, I would just increase chlorthalidone up to full 25 mg daily.  Continue other medications at current doses.

## 2022-02-21 NOTE — Assessment & Plan Note (Signed)
Goal LDL is less than 70 because of elevated Coronary Calcium Score.  He is on 20 atorvastatin.  Most recent lipids were not at goal but he indicated that he was not on his statin.  He is due to have labs checked soon.  Hopefully will be back at goal.  If not, would increase to 40 mg atorvastatin and add Zetia 10 mg.Marland Kitchen

## 2022-02-21 NOTE — Assessment & Plan Note (Addendum)
Multiple medications.  Still has borderline blood pressure control.  Suspect that we may need additional medication in the future, but we still have room to titrate chlorthalidone up to 25 mg.  He is working on diet and excise.

## 2022-02-21 NOTE — Assessment & Plan Note (Addendum)
Elevated Coronary Calcium Score but no active symptoms.  He is very active doing vigorous workouts in the gym without any symptoms of chest pain or pressure or dyspnea.  Target LDL should be less than 70-he is at goal.  Back on statin.  BP looks better having increase his chlorthalidone up to 25 mg daily.  Still have a little bit of his decreased although he says his pressures are better at home. -> He is on a combination of amlodipine 10 mg, carvedilol 25 mg twice daily, chlorthalidone 25 mg daily and telmisartan 80 mg daily.  He is on glimepiride, metformin and Trulicity for his diabetes along with Januvia.  He is not on GLP-1 agonist or SGLT2 inhibitor.  Would consider adding

## 2022-04-19 IMAGING — CT CT CARDIAC CORONARY ARTERY CALCIUM SCORE
3 series · 14 of 20 positions shown, 16 images · non-contrast
Comparison: None.
COMPARISON: None.

Addendum:
EXAM:
OVER-READ INTERPRETATION  CT CHEST

The following report is an over-read performed by radiologist Dr.
Moses Vue [REDACTED] on 02/02/2021. This
over-read does not include interpretation of cardiac or coronary
anatomy or pathology. The coronary calcium score interpretation by
the cardiologist is attached.
CLINICAL DATA: Cardiovascular Disease Risk stratification
Coronary Calcium Score
TECHNIQUE: A gated, non-contrast computed tomography scan of the heart was
performed using 3mm slice thickness. Axial images were analyzed on a
dedicated workstation. Calcium scoring of the coronary arteries was
performed using the Agatston method.

[Series 2: cascseq 2.0 sa36 70% (id) · axial · 0.44mm/px · z∈[-223,-143]mm · 4 of 68 slices shown]
[im 14/68  vessel]
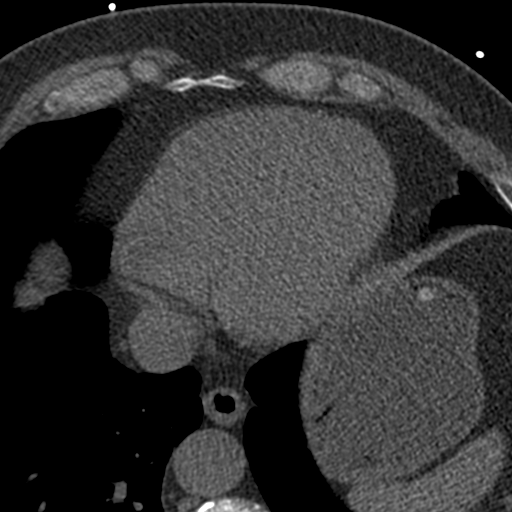
[im 27/68  vessel]
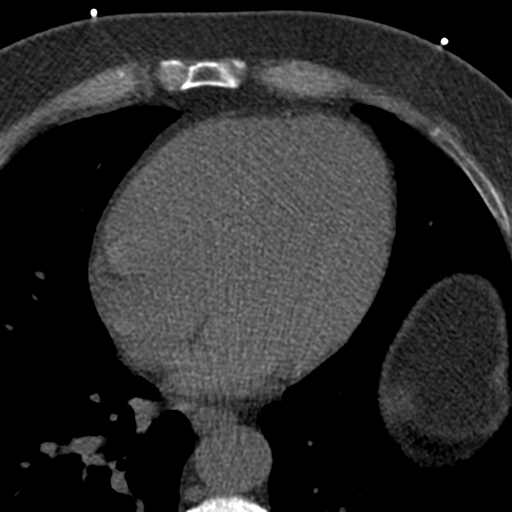
[im 41/68  vessel]
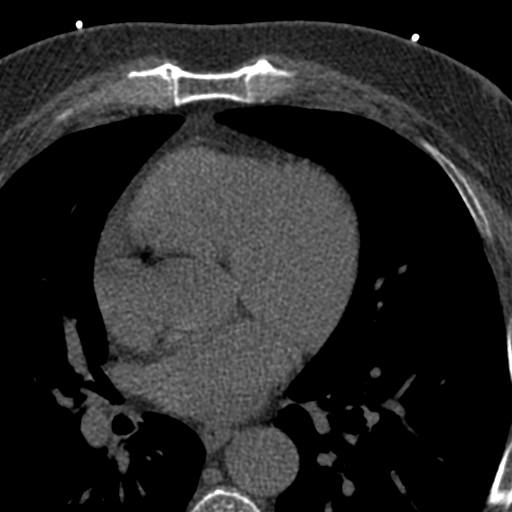
[im 54/68  vessel]
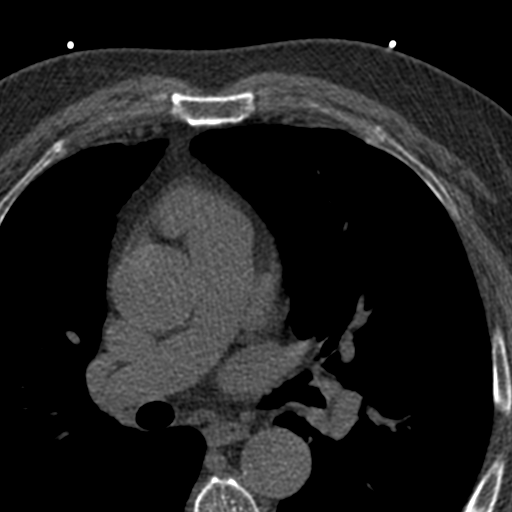

[Series 3: cascseq 2.0 bf37 st · axial · 0.73mm/px · z∈[-227,-139]mm · 5 of 68 slices shown, 7 images]
[im 12/68  vessel]
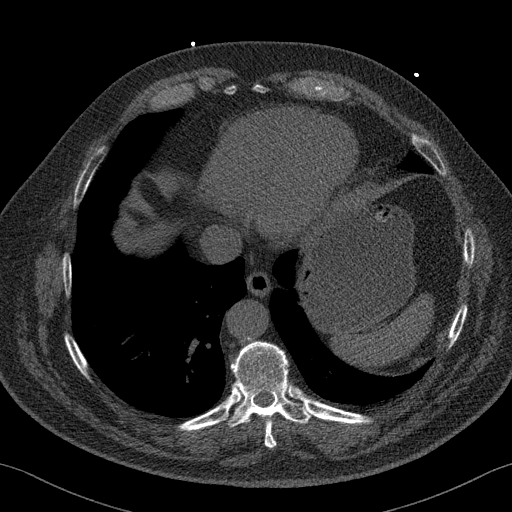
[im 12/68  lung]
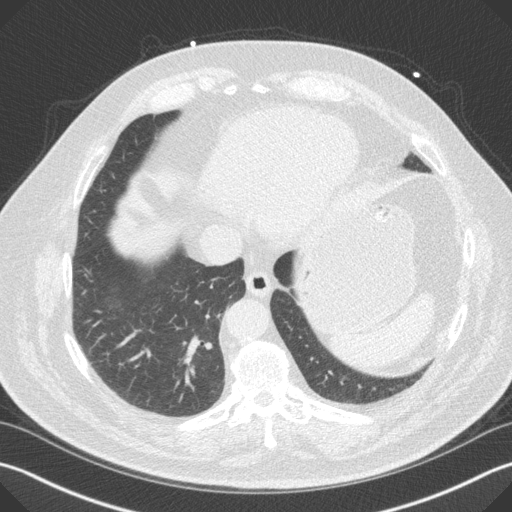
[im 23/68  vessel]
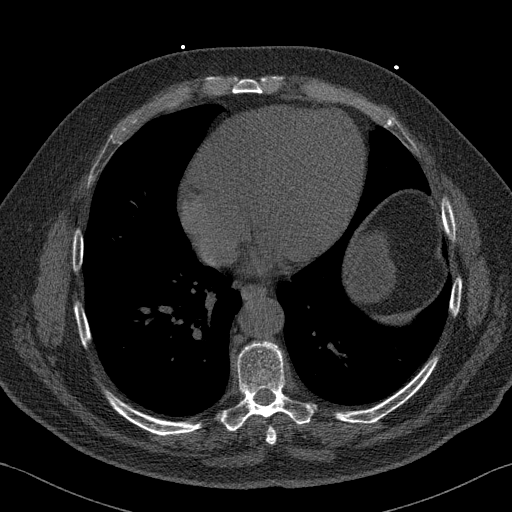
[im 34/68  vessel]
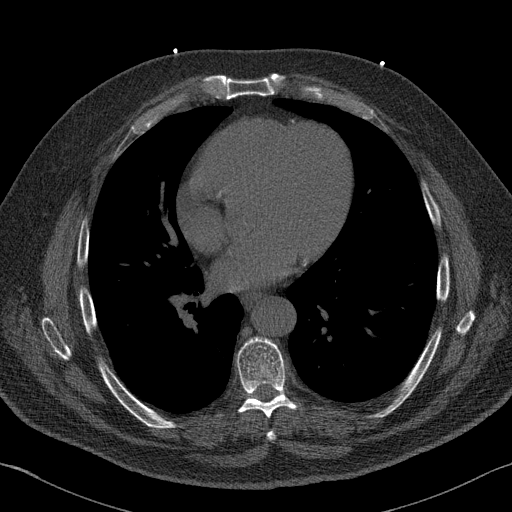
[im 45/68  vessel]
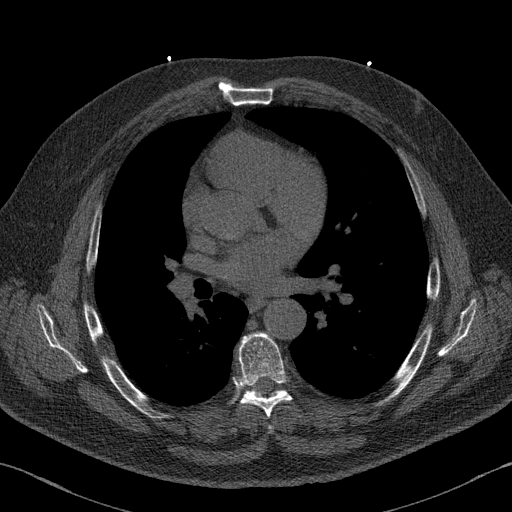
[im 56/68  vessel]
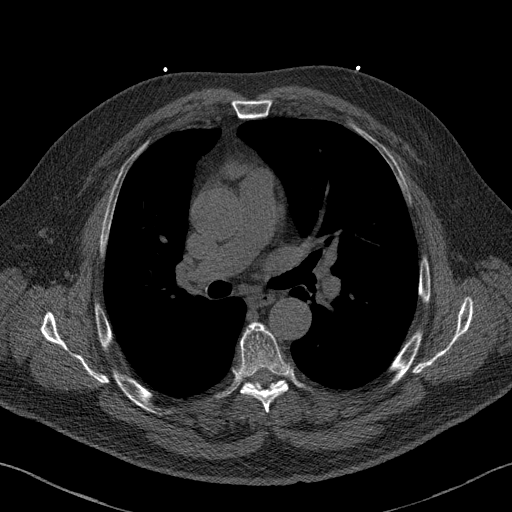
[im 56/68  lung]
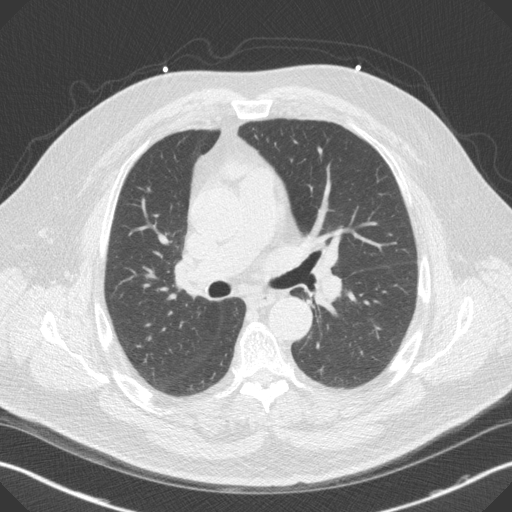

[Series 4: cascseq 2.0 br59 lung · axial · 0.73mm/px · z∈[-227,-139]mm · 5 of 68 slices shown]
[im 12/68  lung]
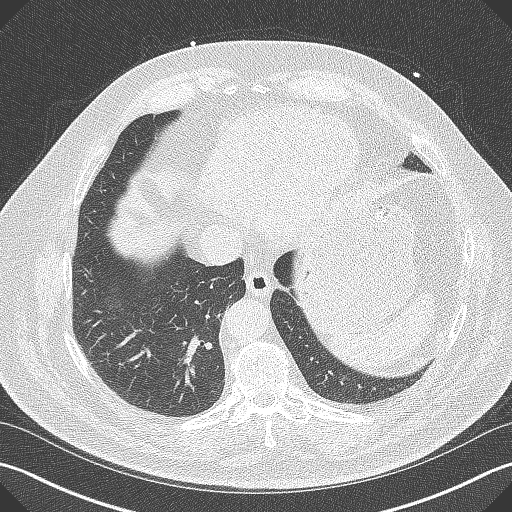
[im 23/68  lung]
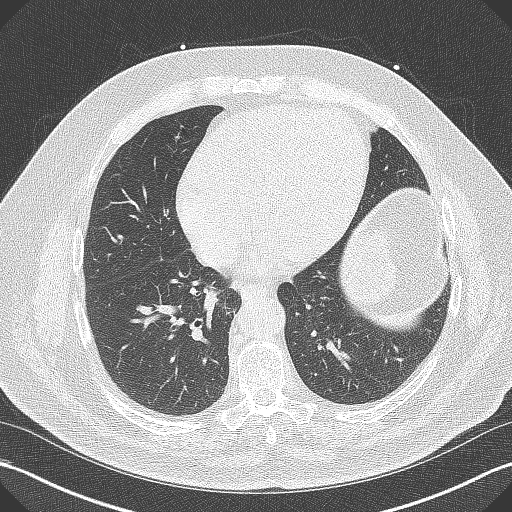
[im 34/68  lung]
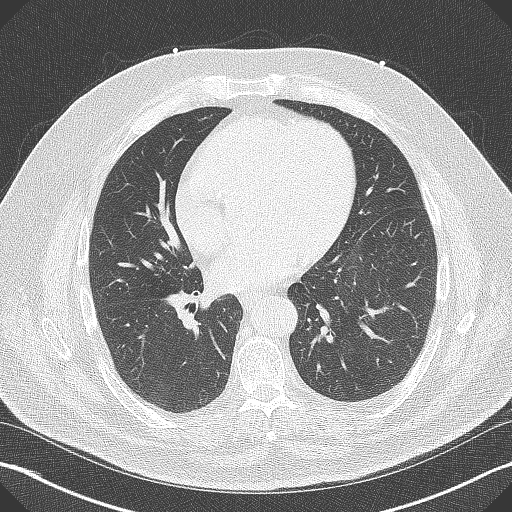
[im 45/68  lung]
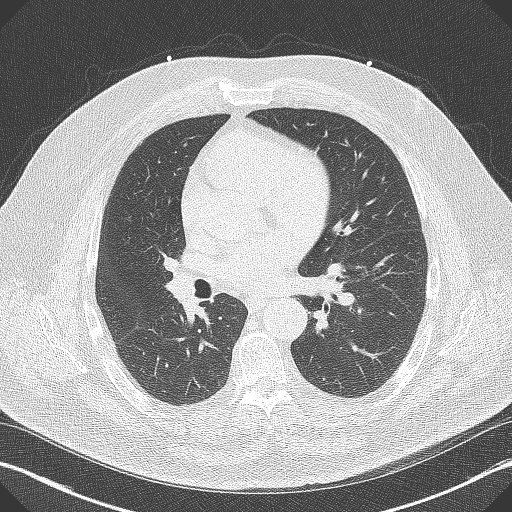
[im 56/68  lung]
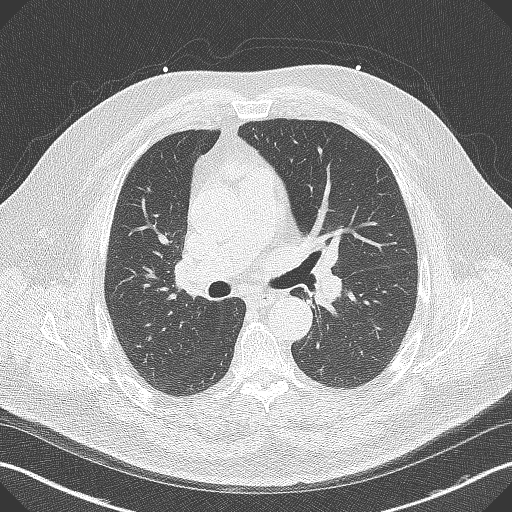

[14 of 20 positions shown; findings below may reference images not displayed]

FINDINGS: Atherosclerotic calcifications in the thoracic aorta. Within the
visualized portions of the thorax there are no suspicious appearing
pulmonary nodules or masses, there is no acute consolidative
airspace disease, no pleural effusions, no pneumothorax and no
lymphadenopathy. Visualized portions of the upper abdomen
demonstrate diffuse low attenuation throughout the visualized
hepatic parenchyma, indicative of hepatic steatosis. There are no
aggressive appearing lytic or blastic lesions noted in the
visualized portions of the skeleton.
IMPRESSION: 1.  Aortic Atherosclerosis (LHQJF-GZI.I).
2. Hepatic steatosis.
FINDINGS: Coronary Calcium Score:

Left main: 0

Left anterior descending artery: 482

Left circumflex artery:

Right coronary artery: 106

Total: 683

Percentile: 87

Pericardium: Normal.

Ascending Aorta: Normal caliber.  Aortic atherosclerosis.

Non-cardiac: See separate report from [REDACTED].
IMPRESSION: Coronary calcium score of 683. This was 87 percentile for age-,
race-, and sex-matched controls.

Aortic atherosclerosis.



If CAC=0, it is reasonable to withhold statin therapy and reassess
in 5 to 10 years, as long as higher risk conditions are absent
(diabetes mellitus, family history of premature CHD in first degree
relatives (males <55 years; females <65 years), cigarette smoking,
or LDL >=190 mg/dL).

If CAC is 1 to 99, it is reasonable to initiate statin therapy for
patients >=55 years of age.

If CAC is >=100 or >=75th percentile, it is reasonable to initiate
statin therapy at any age.

Cardiology referral should be considered for patients with CAC
scores >=400 or >=75th percentile.

*3979 AHA/ACC/AACVPR/AAPA/ABC/WILLIAM/TBOS/LIZANKA/Bitar/HARODI/BUCHANAN/LIV
Guideline on the Management of Blood Cholesterol: A Report of the
American College of Cardiology/American Heart Association Task Force
on Clinical Practice Guidelines. J Am Coll Cardiol.
3078;73(24):1777-1147.

*** End of Addendum ***
EXAM:
OVER-READ INTERPRETATION  CT CHEST

The following report is an over-read performed by radiologist Dr.
Moses Vue [REDACTED] on 02/02/2021. This
over-read does not include interpretation of cardiac or coronary
anatomy or pathology. The coronary calcium score interpretation by
the cardiologist is attached.
FINDINGS: Atherosclerotic calcifications in the thoracic aorta. Within the
visualized portions of the thorax there are no suspicious appearing
pulmonary nodules or masses, there is no acute consolidative
airspace disease, no pleural effusions, no pneumothorax and no
lymphadenopathy. Visualized portions of the upper abdomen
demonstrate diffuse low attenuation throughout the visualized
hepatic parenchyma, indicative of hepatic steatosis. There are no
aggressive appearing lytic or blastic lesions noted in the
visualized portions of the skeleton.
IMPRESSION: 1.  Aortic Atherosclerosis (LHQJF-GZI.I).
2. Hepatic steatosis.

## 2022-06-15 ENCOUNTER — Other Ambulatory Visit: Payer: Self-pay | Admitting: Otolaryngology

## 2022-06-15 ENCOUNTER — Other Ambulatory Visit: Payer: Self-pay

## 2022-06-15 ENCOUNTER — Encounter (HOSPITAL_COMMUNITY): Payer: Self-pay | Admitting: Otolaryngology

## 2022-06-15 NOTE — Progress Notes (Addendum)
SDW CALL  Patient was given pre-op instructions over the phone. The opportunity was given for the patient to ask questions. No further questions asked. Patient verbalized understanding of instructions given.   PCP - [facility] Cardiologist - Dr. Bryan Lemma  PPM/ICD - denies Device Orders -  Rep Notified -   Chest x-ray - na EKG - 02/18/22 Stress Test - none ECHO -12/08/20  Cardiac Cath - none  Sleep Study - none CPAP - no  Fasting Blood Sugar - pt unaware of what his FBS is because he rarely checks his blood sugar. He states his PCP told him he did not need to check it since he is taking Metformin,Amaryl, and Trulicity. I instructed to check it 4 times a day tomorrow and when he gets up the DOS and every 2 hours until he arrives at hospital. Instructed him to treat a blood sugar < 70 with 1/2 cup clear juice(apple or cranberry) and recheck his sugar 15 minutes after drinking the juice. If his CBG is not > than 70 on recheck ,he is to call [phone]. Pt verbalized understanding.   GLP-1 Agonist- Trulicity-pt takes on Thursdays. Instructed him not to take it tomorrow 6/6. Last dose was 06/09/22.  Blood Thinner Instructions:na Aspirin Instructions:LD 6/4  ERAS Protcol -clears until 0900 PRE-SURGERY Ensure or G2- no  COVID TEST- DOS for microlaryngoscopy   Anesthesia review: yes - cardiac history without cardiac clearance. Saw cardiologist 02/18/22  Patient denies shortness of breath, fever, cough and chest pain over the phone call    Surgical Instructions    Your procedure is scheduled on Friday June 7  Report to [facility] Main Entrance "A" at 9:35 A.M., then check in with the Admitting office.  Call this number if you have problems the morning of surgery:  [phone]    Remember:  Do not eat after midnight the night before your surgery  You may drink clear liquids until 9:00 the morning of your surgery.   Clear liquids allowed are: Water, Non-Citrus Juices  (without pulp), Carbonated Beverages, Clear Tea, Black Coffee ONLY (NO MILK, CREAM OR POWDERED CREAMER of any kind), and Gatorade   Take these medicines the morning of surgery with A SIP OF WATER: Norvasc, Lipitor, Coreg,Protonix. If needed, take Tylenol, Zyrtec. Do Not take Metformin or Amaryl the morning of surgery.   As of today, STOP taking any Aspirin (unless otherwise instructed by your surgeon) Aleve, Naproxen, Ibuprofen, Motrin, Advil, Goody's, BC's, all herbal medications, fish oil, and all vitamins and Meloxicam (MOBIC).  [facility] [facility] is not responsible for any belongings or valuables. .   Do NOT Smoke (Tobacco/Vaping)  24 hours prior to your procedure  If you use a CPAP at night, you may bring your mask for your overnight stay.   Contacts, glasses, hearing aids, dentures or partials may not be worn into surgery, please bring cases for these belongings   Patients discharged the day of surgery will not be allowed to drive home, and someone needs to stay with them for 24 hours.   SURGICAL WAITING ROOM VISITATION You may have 1 visitor in the pre-op area at a time determined by the pre-op nurse. (Visitor may not switch out) Patients having surgery or a procedure in a hospital may have two support people in the waiting room. Children under the age of 72 must have an adult with them who is not the patient. They may stay in the waiting area during the procedure and may switch out with  other visitors. If the patient needs to stay at the hospital during part of their recovery, the visitor guidelines for inpatient rooms apply.  Please refer to the [facility] website for the visitor guidelines for Inpatients (after your surgery is over and you are in a regular room).     Special instructions:    Oral Hygiene is also important to reduce your risk of infection.  Remember - BRUSH YOUR TEETH THE MORNING OF SURGERY WITH YOUR REGULAR TOOTHPASTE   Day of Surgery:  Take a shower the day of  or night before with antibacterial soap. Wear Clean/Comfortable clothing the morning of surgery Do not apply any deodorants/lotions.   Do not wear jewelry or makeup Do not wear lotions, powders, perfumes/colognes, or deodorant. Do not shave 48 hours prior to surgery.  Men may shave face and neck. Do not bring valuables to the hospital. Do not wear nail polish, gel polish, artificial nails, or any other type of covering on natural nails (fingers and toes) If you have artificial nails or gel coating that need to be removed by a nail salon, please have this removed prior to surgery. Artificial nails or gel coating may interfere with anesthesia's ability to adequately monitor your vital signs. Remember to brush your teeth WITH YOUR REGULAR TOOTHPASTE.

## 2022-06-16 ENCOUNTER — Encounter (HOSPITAL_COMMUNITY): Payer: Self-pay | Admitting: Otolaryngology

## 2022-06-16 NOTE — Anesthesia Preprocedure Evaluation (Addendum)
Anesthesia Evaluation  Patient identified by MRN, date of birth, ID band Patient awake    Reviewed: Allergy & Precautions, NPO status , Patient's Chart, lab work & pertinent test results, reviewed documented beta blocker date and time   History of Anesthesia Complications (+) PONV and history of anesthetic complications  Airway Mallampati: III  TM Distance: >3 FB Neck ROM: Full    Dental  (+) Teeth Intact, Dental Advisory Given   Pulmonary neg pulmonary ROS   Pulmonary exam normal breath sounds clear to auscultation       Cardiovascular hypertension, Pt. on medications and Pt. on home beta blockers + CAD and +CHF  Normal cardiovascular exam Rhythm:Regular Rate:Normal  RBBB  TTE 2022 1. Left ventricular ejection fraction, by estimation, is 45 to 50%. The  left ventricle has mildly decreased function. The left ventricle  demonstrates global hypokinesis. Left ventricular diastolic parameters are  consistent with Grade I diastolic  dysfunction (impaired relaxation).   2. Right ventricular systolic function is normal. The right ventricular  size is normal.   3. The mitral valve is normal in structure. Mild mitral valve  regurgitation. No evidence of mitral stenosis.   4. The aortic valve is normal in structure. There is mild calcification  of the aortic valve. There is mild thickening of the aortic valve. Aortic  valve regurgitation is not visualized. Aortic valve  sclerosis/calcification is present, without any  evidence of aortic stenosis.   5. The inferior vena cava is normal in size with greater than 50%  respiratory variability, suggesting right atrial pressure of 3 mmHg.     Neuro/Psych negative neurological ROS  negative psych ROS   GI/Hepatic Neg liver ROS,GERD  ,,  Endo/Other  diabetes, Type 2, Oral Hypoglycemic Agents    Renal/GU negative Renal ROS  negative genitourinary   Musculoskeletal negative  musculoskeletal ROS (+)    Abdominal   Peds  Hematology negative hematology ROS (+)   Anesthesia Other Findings Mass lesion of the right piriform sinus, which appears to extend onto the arytenoid and partially onto the area epiglottic fold.  This does not obstruct the glottis.  Normal vocal cord mobility without vocal cord nodule, mass, polyp or tumor  Reproductive/Obstetrics                             Anesthesia Physical Anesthesia Plan  ASA: 3  Anesthesia Plan: General   Post-op Pain Management: Tylenol PO (pre-op)*   Induction: Intravenous  PONV Risk Score and Plan: 3 and Midazolam, Dexamethasone and Ondansetron  Airway Management Planned: Oral ETT, Natural Airway and Video Laryngoscope Planned  Additional Equipment:   Intra-op Plan:   Post-operative Plan: Extubation in OR  Informed Consent: I have reviewed the patients History and Physical, chart, labs and discussed the procedure including the risks, benefits and alternatives for the proposed anesthesia with the patient or authorized representative who has indicated his/her understanding and acceptance.     Dental advisory given  Plan Discussed with: CRNA  Anesthesia Plan Comments: (PAT note written 06/16/2022 by Shonna Chock, PA-C.  )       Anesthesia Quick Evaluation

## 2022-06-16 NOTE — Progress Notes (Signed)
Anesthesia Chart Review: SAME DAY WORK-UP  Case: [id number] Date/Time: 06/17/22 1150   Procedure: MICROLARYNGOSCOPY WITH BIOPSY (Bilateral)   Anesthesia type: General   Pre-op diagnosis:      Pyriform sinus mass     Cervical adenopathy   [facility] ROOM 09 / [facility]   Surgeons: Skotnicki, Meghan A, DO       DISCUSSION: Patient is a 67 year old male scheduled for the above procedure. By notes, he was noted to have bilateral neck swelling in January 2024 but in setting of COVID-19 with severe throat pain, so swelling was attributed to ongoing infection. Left neck swelling subsided, but right neck swelling persisted. UA in April showed a ~ 3 cm neck lesion which was confirmed by CT as a soft tissue mass within the right piriform sinus as well as enlarged right level 3 LN. He was referred to ENT. 06/14/22 flexible laryngoscopy showed patent anterior nasal cavity with minimal crusting, no discharge or infection. Mass lesion of the right piriform sinus, which appears to extend onto the arytenoid and partially onto the area epiglottic fold.  This does not obstruct the glottis.  Normal vocal cord mobility without vocal cord nodule, mass, polyp or tumor. Above procedure recommended.   Other history includes never smoker, post-operative N/V, HTN, DM2, elevated coronary calcium score, GERD, TB (1980), nephrolithiasis.   He is followed by cardiologist Dr. Herbie Baltimore for resistant hypertension, chronic CHF with mild LV dysfunction (EF 45-50% 12/08/20), and elevated coronary calcium score.  Last visit 02/18/2022.  He was doing "remarkably well"and working out at the gym 3-4 times a week, as well as using a bicycle treadmill and doing weights.  He gets some exertional dyspnea with overexertion but otherwise no CV symptoms.  Resumed statin for HLD.  25-month follow-up planned.  He is a same day work-up. Labs and anesthesia team evaluation on the day of surgery. Last Trulicity 06/09/22 and instructed to hold until after  surgery. Last ASA 06/14/22.    VS:  BP Readings from Last 3 Encounters:  02/18/22 (!) 140/72  04/26/21 (!) 154/82  12/25/20 (!) 142/80   Pulse Readings from Last 3 Encounters:  02/18/22 60  04/26/21 71  12/25/20 69     PROVIDERS: Barbie Banner, MD is PCP  Bryan Lemma, MD is cardiologist   LABS: For day of surgery as indicated. As of 04/11/22 (Care Everywhere), creatinine 0.90, AST 18, ALT 24, CBC normal.  A1c 5.5% 09/16/2021.   IMAGES: CT Soft Tissue Neck 05/25/22 ([facility]): 1. Soft tissue mass centered within the right piriform sinus,  measuring up to 22 x 8 mm, concerning for malignancy. Recommend  direct visualization.  2. Right level 3 lymph node measuring up to 2.8 x 1.8 cm, with  central necrosis, correlating with the hypoechoic mass seen on neck  ultrasound thin suspicious for metastatic disease.  3. Asymmetric prominence of right level 2A lymph node, measuring up  to 16 x 10 mm, indeterminate.    EKG: 02/18/2022 ([facility]): Sinus rhythm with first-degree AV block Right bundle branch block Left anterior fascicular block Bifascicular block Voltage criteria for LVH Possible lateral infarct, age undetermined.   CV: CT Cardiac Calcium Scoring 02/02/21: IMPRESSION: - Coronary calcium score of 683. This was 38 percentile for age-, race-, and sex-matched controls. - Aortic atherosclerosis.   RECOMMENDATIONS: Coronary artery calcium (CAC) score is a strong predictor of incident coronary heart disease (CHD) and provides predictive information beyond traditional risk factors. CAC scoring is reasonable to use in  the decision to withhold, postpone, or initiate statin therapy in intermediate-risk or selected borderline-risk asymptomatic adults (age 67-75 years and LDL-C >=70 to <190 mg/dL) who do not have diabetes or established atherosclerotic cardiovascular disease (ASCVD).* In intermediate-risk (10-year ASCVD risk >=7.5% to <20%) adults or selected  borderline-risk (10-year ASCVD risk >=5% to <7.5%) adults in whom a CAC score is measured for the purpose of making a treatment decision the following recommendations have been made... Cardiology referral should be considered for patients with CAC scores >=400 or >=75th percentile...    Echo 12/08/20: IMPRESSIONS   1. Left ventricular ejection fraction, by estimation, is 45 to 50%. The  left ventricle has mildly decreased function. The left ventricle  demonstrates global hypokinesis. Left ventricular diastolic parameters are  consistent with Grade I diastolic  dysfunction (impaired relaxation).   2. Right ventricular systolic function is normal. The right ventricular  size is normal.   3. The mitral valve is normal in structure. Mild mitral valve  regurgitation. No evidence of mitral stenosis.   4. The aortic valve is normal in structure. There is mild calcification  of the aortic valve. There is mild thickening of the aortic valve. Aortic  valve regurgitation is not visualized. Aortic valve  sclerosis/calcification is present, without any  evidence of aortic stenosis.   5. The inferior vena cava is normal in size with greater than 50%  respiratory variability, suggesting right atrial pressure of 3 mmHg.    Past Medical History:  Diagnosis Date   Complication of anesthesia    Diabetes mellitus without complication (HCC)    Elevated coronary artery calcium score 02/02/2021   GERD (gastroesophageal reflux disease)    History of kidney stones 2014   Hypertension    PONV (postoperative nausea and vomiting)    Rupture of right quadriceps tendon 09/21/2020   - s/p srugical repair   Tuberculosis 1980    Past Surgical History:  Procedure Laterality Date   KIDNEY SURGERY     KNEE SURGERY     LITHOTRIPSY     QUADRICEPS TENDON REPAIR Right 09/25/2020   Procedure: REPAIR RIGHT QUADRICEP TENDON;  Surgeon: Nadara Mustard, MD;  Location: [facility];  Service: Orthopedics;  Laterality:  Right;   TONSILLECTOMY      MEDICATIONS: No current facility-administered medications for this encounter.    acetaminophen (TYLENOL) 500 MG tablet   amLODipine (NORVASC) 10 MG tablet   aspirin EC 81 MG tablet   atorvastatin (LIPITOR) 20 MG tablet   carvedilol (COREG) 25 MG tablet   cetirizine (ZYRTEC) 10 MG tablet   chlorthalidone (HYGROTON) 25 MG tablet   glimepiride (AMARYL) 4 MG tablet   meloxicam (MOBIC) 15 MG tablet   metFORMIN (GLUCOPHAGE-XR) 500 MG 24 hr tablet   OVER THE COUNTER MEDICATION   OVER THE COUNTER MEDICATION   OVER THE COUNTER MEDICATION   pantoprazole (PROTONIX) 40 MG tablet   potassium citrate (UROCIT-K) 10 MEQ (1080 MG) SR tablet   tadalafil (CIALIS) 20 MG tablet   telmisartan (MICARDIS) 80 MG tablet   triamcinolone cream (KENALOG) 0.1 %   TRULICITY 1.5 MG/0.5ML SOPN   glucose blood (FREESTYLE LITE) test strip    Shonna Chock, PA-C Surgical Short Stay/Anesthesiology [facility] Phone [phone] [facility] Phone [phone] 06/16/2022 9:56 AM

## 2022-06-17 ENCOUNTER — Ambulatory Visit (HOSPITAL_BASED_OUTPATIENT_CLINIC_OR_DEPARTMENT_OTHER): Payer: Medicare HMO | Admitting: Vascular Surgery

## 2022-06-17 ENCOUNTER — Ambulatory Visit (HOSPITAL_COMMUNITY)
Admission: RE | Admit: 2022-06-17 | Discharge: 2022-06-17 | Disposition: A | Discharge status: Home / Self Care | Payer: Medicare HMO | Attending: Otolaryngology | Admitting: Otolaryngology

## 2022-06-17 ENCOUNTER — Other Ambulatory Visit (HOSPITAL_COMMUNITY): Payer: Self-pay

## 2022-06-17 ENCOUNTER — Other Ambulatory Visit: Payer: Self-pay

## 2022-06-17 ENCOUNTER — Ambulatory Visit (HOSPITAL_COMMUNITY): Payer: Medicare HMO | Admitting: Vascular Surgery

## 2022-06-17 ENCOUNTER — Encounter (HOSPITAL_COMMUNITY): Payer: Self-pay | Admitting: Otolaryngology

## 2022-06-17 ENCOUNTER — Encounter (HOSPITAL_COMMUNITY)
Admission: RE | Disposition: A | Discharge status: Home / Self Care | Payer: Self-pay | Source: Home / Self Care | Attending: Otolaryngology

## 2022-06-17 DIAGNOSIS — Z79899 Other long term (current) drug therapy: Secondary | ICD-10-CM | POA: Diagnosis not present

## 2022-06-17 DIAGNOSIS — J392 Other diseases of pharynx: Secondary | ICD-10-CM | POA: Diagnosis not present

## 2022-06-17 DIAGNOSIS — I110 Hypertensive heart disease with heart failure: Secondary | ICD-10-CM | POA: Diagnosis not present

## 2022-06-17 DIAGNOSIS — Z7985 Long-term (current) use of injectable non-insulin antidiabetic drugs: Secondary | ICD-10-CM | POA: Diagnosis not present

## 2022-06-17 DIAGNOSIS — K219 Gastro-esophageal reflux disease without esophagitis: Secondary | ICD-10-CM | POA: Diagnosis not present

## 2022-06-17 DIAGNOSIS — E119 Type 2 diabetes mellitus without complications: Secondary | ICD-10-CM | POA: Insufficient documentation

## 2022-06-17 DIAGNOSIS — I509 Heart failure, unspecified: Secondary | ICD-10-CM

## 2022-06-17 DIAGNOSIS — Z8616 Personal history of COVID-19: Secondary | ICD-10-CM | POA: Insufficient documentation

## 2022-06-17 DIAGNOSIS — I2510 Atherosclerotic heart disease of native coronary artery without angina pectoris: Secondary | ICD-10-CM

## 2022-06-17 DIAGNOSIS — C12 Malignant neoplasm of pyriform sinus: Secondary | ICD-10-CM | POA: Insufficient documentation

## 2022-06-17 DIAGNOSIS — R221 Localized swelling, mass and lump, neck: Secondary | ICD-10-CM | POA: Diagnosis present

## 2022-06-17 DIAGNOSIS — Z7984 Long term (current) use of oral hypoglycemic drugs: Secondary | ICD-10-CM | POA: Diagnosis not present

## 2022-06-17 DIAGNOSIS — J387 Other diseases of larynx: Secondary | ICD-10-CM | POA: Insufficient documentation

## 2022-06-17 DIAGNOSIS — R590 Localized enlarged lymph nodes: Secondary | ICD-10-CM | POA: Diagnosis present

## 2022-06-17 HISTORY — PX: MICROLARYNGOSCOPY: SHX5208

## 2022-06-17 LAB — BASIC METABOLIC PANEL
Anion gap: 12 (ref 5–15)
BUN: 15 mg/dL (ref 8–23)
CO2: 24 mmol/L (ref 22–32)
Calcium: 9.6 mg/dL (ref 8.9–10.3)
Chloride: 100 mmol/L (ref 98–111)
Creatinine, Ser: 0.81 mg/dL (ref 0.61–1.24)
GFR, Estimated: >60 mL/min (ref >60)
Glucose, Bld: 188 mg/dL — ABNORMAL HIGH (ref 70–99)
Potassium: 3.7 mmol/L (ref 3.5–5.1)
Sodium: 136 mmol/L (ref 135–145)

## 2022-06-17 LAB — GLUCOSE, CAPILLARY
Glucose-Capillary: 178 mg/dL — ABNORMAL HIGH (ref 70–99)
Glucose-Capillary: 114 mg/dL — ABNORMAL HIGH (ref 70–99)
Glucose-Capillary: 103 mg/dL — ABNORMAL HIGH (ref 70–99)

## 2022-06-17 LAB — CBC
HCT: 43.6 % (ref 39.0–52.0)
Hemoglobin: 15.3 g/dL (ref 13.0–17.0)
MCH: 32.2 pg (ref 26.0–34.0)
MCHC: 35.1 g/dL (ref 30.0–36.0)
MCV: 91.8 fL (ref 80.0–100.0)
Platelets: 187 10*3/uL (ref 150–400)
RBC: 4.75 MIL/uL (ref 4.22–5.81)
RDW: 12.1 % (ref 11.5–15.5)
WBC: 8.5 10*3/uL (ref 4.0–10.5)
nRBC: 0 % (ref 0.0–0.2)

## 2022-06-17 LAB — SARS CORONAVIRUS 2 BY RT PCR: SARS Coronavirus 2 by RT PCR: NEGATIVE

## 2022-06-17 SURGERY — MICROLARYNGOSCOPY
Anesthesia: General | Site: Throat | Laterality: Bilateral

## 2022-06-17 MED ORDER — ROCURONIUM BROMIDE 10 MG/ML (PF) SYRINGE
PREFILLED_SYRINGE | INTRAVENOUS | Status: AC
  Filled 2022-06-17: qty 10

## 2022-06-17 MED ORDER — FENTANYL CITRATE (PF) 250 MCG/5ML IJ SOLN
INTRAMUSCULAR | Status: AC
  Filled 2022-06-17: qty 5

## 2022-06-17 MED ORDER — ROCURONIUM BROMIDE 10 MG/ML (PF) SYRINGE
PREFILLED_SYRINGE | INTRAVENOUS | Status: DC | PRN
  Administered 2022-06-17: 40 mg via INTRAVENOUS

## 2022-06-17 MED ORDER — INSULIN ASPART 100 UNIT/ML IJ SOLN
0.0000 [IU] | INTRAMUSCULAR | Status: DC | PRN
  Administered 2022-06-17: 2 [IU] via SUBCUTANEOUS

## 2022-06-17 MED ORDER — ACETAMINOPHEN 500 MG PO TABS
1000.0000 mg | ORAL_TABLET | Freq: Once | ORAL | Status: AC
  Administered 2022-06-17: 1000 mg via ORAL
  Filled 2022-06-17: qty 2

## 2022-06-17 MED ORDER — ONDANSETRON HCL 4 MG/2ML IJ SOLN
INTRAMUSCULAR | Status: DC | PRN
  Administered 2022-06-17: 4 mg via INTRAVENOUS

## 2022-06-17 MED ORDER — DEXAMETHASONE SODIUM PHOSPHATE 10 MG/ML IJ SOLN
INTRAMUSCULAR | Status: AC
  Filled 2022-06-17: qty 2

## 2022-06-17 MED ORDER — SUGAMMADEX SODIUM 200 MG/2ML IV SOLN
INTRAVENOUS | Status: DC | PRN
  Administered 2022-06-17: 200 mg via INTRAVENOUS

## 2022-06-17 MED ORDER — FENTANYL CITRATE (PF) 100 MCG/2ML IJ SOLN
INTRAMUSCULAR | Status: DC | PRN
  Administered 2022-06-17: 50 ug via INTRAVENOUS

## 2022-06-17 MED ORDER — ONDANSETRON HCL 4 MG/2ML IJ SOLN
INTRAMUSCULAR | Status: AC
  Filled 2022-06-17: qty 2

## 2022-06-17 MED ORDER — ORAL CARE MOUTH RINSE: 15.0000 mL | Freq: Once | OROMUCOSAL | Status: AC

## 2022-06-17 MED ORDER — LIDOCAINE 2% (20 MG/ML) 5 ML SYRINGE
INTRAMUSCULAR | Status: AC
  Filled 2022-06-17: qty 5

## 2022-06-17 MED ORDER — MIDAZOLAM HCL 2 MG/2ML IJ SOLN
INTRAMUSCULAR | Status: AC
  Filled 2022-06-17: qty 2

## 2022-06-17 MED ORDER — DEXAMETHASONE SODIUM PHOSPHATE 4 MG/ML IJ SOLN
INTRAMUSCULAR | Status: DC | PRN
  Administered 2022-06-17: 4 mg via INTRAVENOUS

## 2022-06-17 MED ORDER — SUCCINYLCHOLINE CHLORIDE 200 MG/10ML IV SOSY
PREFILLED_SYRINGE | INTRAVENOUS | Status: DC | PRN
  Administered 2022-06-17: 120 mg via INTRAVENOUS

## 2022-06-17 MED ORDER — LACTATED RINGERS IV SOLN: INTRAVENOUS | Status: DC

## 2022-06-17 MED ORDER — MIDAZOLAM HCL 5 MG/5ML IJ SOLN
INTRAMUSCULAR | Status: DC | PRN
  Administered 2022-06-17: 2 mg via INTRAVENOUS

## 2022-06-17 MED ORDER — SUCCINYLCHOLINE CHLORIDE 200 MG/10ML IV SOSY
PREFILLED_SYRINGE | INTRAVENOUS | Status: AC
  Filled 2022-06-17: qty 10

## 2022-06-17 MED ORDER — HYDROCODONE-ACETAMINOPHEN 5-325 MG PO TABS
1.0000 | ORAL_TABLET | Freq: Four times a day (QID) | ORAL | 0 refills | Status: AC | PRN
  Filled 2022-06-17: qty 5, 2d supply, fill #0

## 2022-06-17 MED ORDER — 0.9 % SODIUM CHLORIDE (POUR BTL) OPTIME
TOPICAL | Status: DC | PRN
  Administered 2022-06-17: 1000 mL

## 2022-06-17 MED ORDER — INSULIN ASPART 100 UNIT/ML IJ SOLN
INTRAMUSCULAR | Status: AC
  Filled 2022-06-17: qty 1

## 2022-06-17 MED ORDER — PROPOFOL 10 MG/ML IV BOLUS
INTRAVENOUS | Status: DC | PRN
  Administered 2022-06-17: 200 mg via INTRAVENOUS

## 2022-06-17 MED ORDER — EPINEPHRINE PF 1 MG/ML IJ SOLN
INTRAMUSCULAR | Status: DC | PRN
  Administered 2022-06-17: 10 mL

## 2022-06-17 MED ORDER — CEFAZOLIN SODIUM-DEXTROSE 2-4 GM/100ML-% IV SOLN
2.0000 g | INTRAVENOUS | Status: AC
  Administered 2022-06-17: 2 g via INTRAVENOUS
  Filled 2022-06-17: qty 100

## 2022-06-17 MED ORDER — CHLORHEXIDINE GLUCONATE 0.12 % MT SOLN
15.0000 mL | Freq: Once | OROMUCOSAL | Status: AC
  Administered 2022-06-17: 15 mL via OROMUCOSAL
  Filled 2022-06-17: qty 15

## 2022-06-17 MED ORDER — EPINEPHRINE HCL (NASAL) 0.1 % NA SOLN
NASAL | Status: AC
  Filled 2022-06-17: qty 60

## 2022-06-17 MED ORDER — FENTANYL CITRATE (PF) 100 MCG/2ML IJ SOLN: 25.0000 ug | INTRAMUSCULAR | Status: DC | PRN

## 2022-06-17 MED ORDER — LIDOCAINE 2% (20 MG/ML) 5 ML SYRINGE
INTRAMUSCULAR | Status: DC | PRN
  Administered 2022-06-17: 60 mg via INTRAVENOUS

## 2022-06-17 MED ORDER — PROPOFOL 10 MG/ML IV BOLUS
INTRAVENOUS | Status: AC
  Filled 2022-06-17: qty 20

## 2022-06-17 SURGICAL SUPPLY — 22 items
BAG COUNTER SPONGE SURGICOUNT (BAG) ×2 IMPLANT
BAG SPNG CNTER NS LX DISP (BAG) ×1
CANISTER SUCT 3000ML PPV (MISCELLANEOUS) ×2 IMPLANT
CNTNR URN SCR LID CUP LEK RST (MISCELLANEOUS) IMPLANT
CONT SPEC 4OZ STRL OR WHT (MISCELLANEOUS)
COVER BACK TABLE 60X90IN (DRAPES) ×2 IMPLANT
COVER MAYO STAND STRL (DRAPES) ×2 IMPLANT
DRSG TELFA 3X8 NADH STRL (GAUZE/BANDAGES/DRESSINGS) IMPLANT
GLOVE SURG SS PI 6.5 STRL IVOR (GLOVE) IMPLANT
GOWN STRL REUS W/ TWL LRG LVL3 (GOWN DISPOSABLE) IMPLANT
GOWN STRL REUS W/TWL LRG LVL3 (GOWN DISPOSABLE)
KIT BASIN OR (CUSTOM PROCEDURE TRAY) ×2 IMPLANT
KIT TURNOVER KIT B (KITS) ×2 IMPLANT
NS IRRIG 1000ML POUR BTL (IV SOLUTION) ×2 IMPLANT
PAD ARMBOARD 7.5X6 YLW CONV (MISCELLANEOUS) ×4 IMPLANT
PATTIES SURGICAL .5 X1 (DISPOSABLE) ×2 IMPLANT
POSITIONER HEAD DONUT 9IN (MISCELLANEOUS) IMPLANT
SOL ANTI FOG 6CC (MISCELLANEOUS) ×2 IMPLANT
SURGILUBE 2OZ TUBE FLIPTOP (MISCELLANEOUS) IMPLANT
TOWEL GREEN STERILE FF (TOWEL DISPOSABLE) ×2 IMPLANT
TUBE CONNECTING 12X1/4 (SUCTIONS) ×2 IMPLANT
WATER STERILE IRR 1000ML POUR (IV SOLUTION) ×2 IMPLANT

## 2022-06-17 NOTE — Discharge Instructions (Signed)
[facility] POST OP INSTRUCTIONS: LARYNGOSCOPY  The Surgery Itself Laryngoscopy with biopsy or injection involves a brief general anesthesia, typically for  less than one hour. Patients may be sedated for several hours after surgery and may  remain sleepy for the better part of the day. Nausea and vomiting are occasionally seen,  and usually resolve by the evening of surgery - even without additional medications.  Almost all patients can go home the day of surgery.  After Surgery ? You will have a sore throat from the metal instruments used to allow a good view  of your voice box for 3-5 days after surgery. Some patients may have sores on  the tongue or in the mouth. This is normal and you will be given pain  medications for this. If you have sores in the mouth, avoid citrus or acidic  foods/drinks since they will burn.  ? Avoid coughing or frequent throat clearing. Drinking water can help alleviate the  urge to clear the throat. ? Avoid any heavy lifting (more than 20 lbs), straining, exercise, or sports activities  for 2 weeks after surgery. ? Avoid alcohol, tobacco products, spicy foods, or eating late at night as these may  cause heartburn or stomach reflux and may delay the healing process. ? Your voice may be hoarse after surgery from swelling of the vocal cords caused  by manipulation. ? You do not have to avoid talking unless your surgeon gives you specific  instructions. Use your normal voice since whispering is harder on your vocal  cords then talking at a regular volume. ? If your surgeon advises voice rest, you should do the following: o You are to have absolute voice rest for 7 days. You may want to purchase  a dry erase board to communicate during this time. After that, you may  begin to use your voice at a soft spoken level. o You should only speak loud enough for people to hear you that are within  an arm's length away. Do not yell or whisper. You may increase your   voice use by 5 minutes/hour each day after the first 7 days of rest.  Medications ? Pain medication can be used for pain as prescribed. Pain in the throat and the  tongue is normal. ? Some patients will be given steroids or acid reducing medications after surgery,  take these as directed. ? Take all of your routine medications as prescribed, unless told otherwise by your  surgeon. Any medications that thin the blood should be avoided unless approved  by your surgeon.  ? IT IS OK TO TAKE OVER THE COUNTER PAIN MEDICATION  (IBUPROFEN, NAPROXEN, or ACETAMINOPHEN) IN ADDITION TO  YOUR PRESCRIBED MEDICATIONS. DO NOT TAKE ASPIRIN UNLESS  CLEARED WITH YOUR SURGEON.  ? Limit Acetaminophen/Tylenol to less than 4,000mg/day  ? Limit Ibuprofen/Motrin to less than 3,600mg/day  Final Result ? Following vocal cord injection, the voice may seem "strangled" due to swelling for  a few days or weeks. This is normal.  ? If you have a biopsy in surgery, you will usually find out the pathology results  when you are seen in the office for follow up. ? Many patients benefit from voice therapy after surgery to improve the long term  result. Your surgeon will recommend this if you could benefit from it  

## 2022-06-17 NOTE — Op Note (Signed)
OPERATIVE NOTE  DUSTEN ELLINWOOD Date/Time of Admission: 06/17/2022  9:23 AM  CSN: [id number];WUJ:[id number] Attending Provider: Cheron Schaumann A, DO Room/Bed: MCPO/NONE DOB: Sep 14, 1955 Age: 67 y.o.   Pre-Op Diagnosis: Pyriform sinus mass; Cervical adenopathy  Post-Op Diagnosis: Pyriform sinus mass; Cervical adenopathy  Procedure: Procedure(s): MICROLARYNGOSCOPY WITH BIOPSY  Anesthesia: General  Surgeon(s): Shenell Rogalski A Birttany Dechellis, DO  Staff: Circulator: Pietro Cassis, RN Scrub Person: Mickle Asper D  Implants: * No implants in log *  Specimens: ID Type Source Tests Collected by Time Destination  1 : right pyriform recess Tissue PATH ENT biopsy SURGICAL PATHOLOGY Kaien Pezzullo A, DO 06/17/2022 1306   2 : post cricoid Tissue PATH ENT biopsy SURGICAL PATHOLOGY Maxfield Gildersleeve A, DO 06/17/2022 1316     Complications: None  EBL: <3 ML  Condition: stable  Operative Findings:  Large mass of the right pyriform recess, with extension to the right arytenoid and aryepiglottic fold. True vocal folds appear normal. Post cricoid mucosa edematous but with no frank abnormality.   Description of Operation: Once operative consent was obtained, and the surgical site confirmed with the operating room team, the patient was brought back to the operating room and general endotracheal anesthesia was obtained. The patient was turned over to the ENT service. An operating laryngoscope was used to directly visualize the upper airway and glottis. All anatomic areas from the oral cavity to the glottis were examined and noted to be normal with only exceptions noted in this report. Areas examined included the oropharynx, vallecula, both surfaces of the epiglottis, glottis, post cricoid region and bilateral pyriform sinuses.   The patient was placed in laryngeal suspension with the focus on the glottis with the ET tube intact.  Multiple biopsies were taken, with initial specimen sent for frozen  section.   Hemostasis was obtained with an adrenaline soaked pledget.   An oral gastric tube was placed into the stomach and suctioned to reduce postoperative nausea. The patient was turned back over to the anesthesia service. The patient was transferred to the PACU in stable condition.     Laren Boom, DO [facility] ENT  06/17/2022

## 2022-06-17 NOTE — H&P (Signed)
Randall [name] is an 67 y.o. male.    Chief Complaint:  Neck mass, mass of larynx  HPI: Patient presents today for planned elective procedure.  He denies any interval change in history since office visit on 06/14/2022:  Randall [name] is a 66 y.o. male who presents as a new consult, referred by Randall [name], *, for evaluation and treatment of neck mass and an abnormal CT neck.  The patient first observed a swollen area in his neck bilaterally in 01/2022. At time, he had been diagnosed with COVID and had severe throat pain, so he attributed his symptoms to ongoing infection. He states that subsequently, the left-sided neck swelling subsided, but the right neck swelling persisted, prompting him to seek evaluation by his PCP. An ultrasound was ordered and performed in early April, which demonstrated a 3.2 x 1.5 x 2.5 cm lesion in the right neck, and CT neck with contrast was recommended. This was subsequently performed on 05/10/2022 which reported a right level 3 lymph node measuring 2.8 x 1.8 cm, as well as a smaller right level 2 lymph node and a soft tissue mass within the right piriform sinus measuring 22 x 8 mm. He denies any associated symptoms such as unintentional weight loss, dysphagia, odynophagia, or respiratory distress post-COVID-19 infection. His overall health is generally good. Patient is a non-smoker with no tobacco use history. He denies alcohol consumption and use of recreational drugs. He is a type II diabetic, but is otherwise in good health.   Past Medical History:  Diagnosis Date   Complication of anesthesia    Diabetes mellitus without complication (HCC)    Elevated coronary artery calcium score 02/02/2021   GERD (gastroesophageal reflux disease)    History of kidney stones 2014   Hypertension    PONV (postoperative nausea and vomiting)    Rupture of right quadriceps tendon 09/21/2020   - s/p srugical repair   Tuberculosis 1980    Past Surgical History:   Procedure Laterality Date   KIDNEY SURGERY     KNEE SURGERY     LITHOTRIPSY     QUADRICEPS TENDON REPAIR Right 09/25/2020   Procedure: REPAIR RIGHT QUADRICEP TENDON;  Surgeon: Nadara Mustard, MD;  Location: [facility] OR;  Service: Orthopedics;  Laterality: Right;   TONSILLECTOMY      History reviewed. No pertinent family history.  Social History:  reports that he has never smoked. He has never used smokeless tobacco. He reports that he does not drink alcohol and does not use drugs.  Allergies:  Allergies  Allergen Reactions   Norgesic Forte [Orphenadrine-Aspirin-Caffeine] Other (See Comments)    Unknown reaction    Latex Hives and Rash    Blisters    Medications Prior to Admission  Medication Sig Dispense Refill   acetaminophen (TYLENOL) 500 MG tablet Take 500-1,000 mg by mouth every 6 (six) hours as needed for moderate pain or headache.     amLODipine (NORVASC) 10 MG tablet Take 10 mg by mouth daily.     aspirin EC 81 MG tablet Take 81 mg by mouth daily. Swallow whole.     atorvastatin (LIPITOR) 20 MG tablet Take 20 mg by mouth daily.     carvedilol (COREG) 25 MG tablet Take 25 mg by mouth 2 (two) times daily with a meal.     cetirizine (ZYRTEC) 10 MG tablet Take 10 mg by mouth daily as needed for allergies. 24 hour     chlorthalidone (HYGROTON) 25 MG tablet TAKE 1/2 TABLET  BY MOUTH EVERY DAY 45 tablet 3   glimepiride (AMARYL) 4 MG tablet Take 4 mg by mouth daily with breakfast.     meloxicam (MOBIC) 15 MG tablet Take 15 mg by mouth daily.     metFORMIN (GLUCOPHAGE-XR) 500 MG 24 hr tablet Take 1,000 mg by mouth 2 (two) times daily.     OVER THE COUNTER MEDICATION Take 2 capsules by mouth daily. xEO Mega Complex     OVER THE COUNTER MEDICATION Take 2 capsules by mouth daily. Alpha crs plus supplement     OVER THE COUNTER MEDICATION Take 2 capsules by mouth daily. Microplex vmz supplement     pantoprazole (PROTONIX) 40 MG tablet Take 40 mg by mouth daily.     potassium citrate  (UROCIT-K) 10 MEQ (1080 MG) SR tablet Take 10 mEq by mouth 2 (two) times daily.     tadalafil (CIALIS) 20 MG tablet Take 20 mg by mouth daily as needed for erectile dysfunction.     telmisartan (MICARDIS) 80 MG tablet Take 80 mg by mouth daily.     TRULICITY 1.5 MG/0.5ML SOPN Inject 1.5 mg as directed once a week. Thursday morning     glucose blood (FREESTYLE LITE) test strip Test glucose BID   Diagnoses Code: E11.9     triamcinolone cream (KENALOG) 0.1 % Apply 1 application topically 3 (three) times daily as needed for itching (rash).      Results for orders placed or performed during the hospital encounter of 06/17/22 (from the past 48 hour(s))  Glucose, capillary     Status: Abnormal   Collection Time: 06/17/22  9:43 AM  Result Value Ref Range   Glucose-Capillary 178 (H) 70 - 99 mg/dL    Comment: Glucose reference range applies only to samples taken after fasting for at least 8 hours.  CBC per protocol     Status: None   Collection Time: 06/17/22 10:01 AM  Result Value Ref Range   WBC 8.5 4.0 - 10.5 K/uL   RBC 4.75 4.22 - 5.81 MIL/uL   Hemoglobin 15.3 13.0 - 17.0 g/dL   HCT 21.3 08.6 - 57.8 %   MCV 91.8 80.0 - 100.0 fL   MCH 32.2 26.0 - 34.0 pg   MCHC 35.1 30.0 - 36.0 g/dL   RDW 46.9 62.9 - 52.8 %   Platelets 187 150 - 400 K/uL   nRBC 0.0 0.0 - 0.2 %    Comment: Performed at [facility], [address] [place], [place] [address]   No results found.  ROS: ROS  Blood pressure (!) 166/71, pulse 66, temperature 98.1 F (36.7 C), temperature source Oral, resp. rate 18, height 5\' 8"  (1.727 m), weight 90.7 kg, SpO2 97 %.  PHYSICAL EXAM: Physical Exam Constitutional:      Appearance: Normal appearance.  HENT:     Right Ear: External ear normal.     Left Ear: External ear normal.  Pulmonary:     Effort: Pulmonary effort is normal.  Lymphadenopathy:     Cervical: Cervical adenopathy present.  Neurological:     General: No focal deficit present.     Mental Status: He  is alert.  Psychiatric:        Mood and Affect: Mood normal.     Studies Reviewed: CT neck reviewed   Assessment/Plan Randall [name] is a 67 y.o. male with 54-month history of persistent right-sided neck mass, with recently performed imaging with cervical adenopathy and mass lesion of the right piriform recess.  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  -To OR today for microlaryngoscopy and biopsy for tissue sampling. Risks of surgery and recovery reviewed, all questions answered.    Rogue Rafalski A Jafari Mckillop 06/17/2022, 10:30 AM

## 2022-06-17 NOTE — Anesthesia Procedure Notes (Signed)
Procedure Name: Intubation Date/Time: 06/17/2022 12:55 PM  Performed by: Caren Macadam, CRNAPre-anesthesia Checklist: Patient identified, Emergency Drugs available, Suction available and Patient being monitored Patient Re-evaluated:Patient Re-evaluated prior to induction Oxygen Delivery Method: Circle system utilized Preoxygenation: Pre-oxygenation with 100% oxygen Induction Type: IV induction Ventilation: Mask ventilation without difficulty Laryngoscope Size: Glidescope and 3 Grade View: Grade I Tube type: Oral Tube size: 6.0 mm Number of attempts: 1 Airway Equipment and Method: Stylet and Oral airway Placement Confirmation: ETT inserted through vocal cords under direct vision, positive ETCO2 and breath sounds checked- equal and bilateral Secured at: 24 cm Tube secured with: Tape Dental Injury: Teeth and Oropharynx as per pre-operative assessment

## 2022-06-17 NOTE — Transfer of Care (Signed)
Immediate Anesthesia Transfer of Care Note  Patient: Randall [name]  Procedure(s) Performed: DIRECT LARYNGOSCOPY WITH BIOPSY (Bilateral: Throat)  Patient Location: PACU  Anesthesia Type:General  Level of Consciousness: awake and alert   Airway & Oxygen Therapy: Patient Spontanous Breathing and Patient connected to face mask oxygen  Post-op Assessment: Report given to RN and Post -op Vital signs reviewed and stable  Post vital signs: Reviewed and stable  Last Vitals:  Vitals Value Taken Time  BP    Temp    Pulse 68 06/17/22 1333  Resp 20 06/17/22 1333  SpO2 98 % 06/17/22 1333  Vitals shown include unvalidated device data.  Last Pain:  Vitals:   06/17/22 0953  TempSrc:   PainSc: 0-No pain         Complications: No notable events documented.

## 2022-06-18 ENCOUNTER — Encounter (HOSPITAL_COMMUNITY): Payer: Self-pay | Admitting: Otolaryngology

## 2022-06-19 NOTE — Anesthesia Postprocedure Evaluation (Signed)
Anesthesia Post Note  Patient: Randall [name]  Procedure(s) Performed: DIRECT LARYNGOSCOPY WITH BIOPSY (Bilateral: Throat)     Patient location during evaluation: PACU Anesthesia Type: General Level of consciousness: awake and alert Pain management: pain level controlled Vital Signs Assessment: post-procedure vital signs reviewed and stable Respiratory status: spontaneous breathing, nonlabored ventilation, respiratory function stable and patient connected to nasal cannula oxygen Cardiovascular status: blood pressure returned to baseline and stable Postop Assessment: no apparent nausea or vomiting Anesthetic complications: no  No notable events documented.  Last Vitals:  Vitals:   06/17/22 1345 06/17/22 1400  BP: 131/74 136/70  Pulse: 72 74  Resp: 18 14  Temp:  36.7 C  SpO2: 94% 95%    Last Pain:  Vitals:   06/17/22 1400  TempSrc:   PainSc: 0-No pain                 Alexys Lobello L Laguana Desautel

## 2022-06-21 ENCOUNTER — Telehealth: Payer: Self-pay | Admitting: Radiation Oncology

## 2022-06-21 ENCOUNTER — Inpatient Hospital Stay
Admission: RE | Admit: 2022-06-21 | Discharge: 2022-06-21 | Disposition: A | Discharge status: Home / Self Care | Payer: Self-pay | Source: Ambulatory Visit | Attending: Radiation Oncology | Admitting: Radiation Oncology

## 2022-06-21 ENCOUNTER — Other Ambulatory Visit: Payer: Self-pay

## 2022-06-21 DIAGNOSIS — C12 Malignant neoplasm of pyriform sinus: Secondary | ICD-10-CM

## 2022-06-21 NOTE — Telephone Encounter (Signed)
6/11 @ 10:12 am sent via stat fax for CT/US images to be push to [facility] from [facility].

## 2022-06-21 NOTE — Telephone Encounter (Signed)
6/11 @ 9:57 am Left voicemail with Dr. Anders Simmonds (Ref Coord) to see if PET scan been ordered and/or scheduled.  Waiting on confirmation before call patient to be schedule for consult.

## 2022-06-22 ENCOUNTER — Other Ambulatory Visit: Payer: Self-pay | Admitting: Otolaryngology

## 2022-06-22 DIAGNOSIS — C139 Malignant neoplasm of hypopharynx, unspecified: Secondary | ICD-10-CM

## 2022-06-23 NOTE — Progress Notes (Addendum)
Head and Neck Cancer Location of Tumor / Histology: PYRIFORM SINUS, RIGHT, throat cancer  Pt to have PET scan on 06-30-22  (Per Dr. Karsten Fells note on 06-14-22) An ultrasound was ordered and performed in early April, which demonstrated a 3.2 x 1.5 x 2.5 cm lesion in the right neck, and CT neck with contrast was recommended. This was subsequently performed on 05/10/2022 which reported a right level 3 lymph node measuring 2.8 x 1.8 cm, as well as a smaller right level 2 lymph node and a soft tissue mass within the right piriform sinus measuring 22 x 8 mm.   IMPRESSION: 1. Soft tissue mass centered within the right piriform sinus, measuring up to 22 x 8 mm, concerning for malignancy. Recommend direct visualization. 2. Right level 3 lymph node measuring up to 2.8 x 1.8 cm, with central necrosis, correlating with the hypoechoic mass seen on neck ultrasound thin suspicious for metastatic disease. 3. Asymmetric prominence of right level 2A lymph node, measuring up to 16 x 10 mm, indeterminate.   Patient presented with symptoms of: (Per Dr. Karsten Fells note on 06-17-22) The patient first observed a swollen area in his neck bilaterally in 01/2022. At time, he had been diagnosed with COVID and had severe throat pain, so he attributed his symptoms to ongoing infection. He states that subsequently, the left-sided neck swelling subsided, but the right neck swelling persisted, prompting him to seek evaluation by his PCP.   Biopsies revealed:  06-17-22 FINAL MICROSCOPIC DIAGNOSIS:   A. 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   Note: Dr. [place] Callas has peer reviewed the case and agrees with the  interpretation.   B. POST CRICOID, BIOPSY:  -  Squamous mucosa with no significant pathology.  -  Negative for dysplasia/malignancy.   INTRAOPERATIVE DIAGNOSIS:   A.  Right pyriform process: "Invasive squamous cell carcinoma"  Intraoperative  diagnosis rendered by Dr. Reynolds Bowl at 1:30 PM on 06/17/2022.   Nutrition Status Yes No Comments  Weight changes? [x]  []  Noticed over year, nothing fast according to pt, has noticed weight loss since starting Trulicity   Swallowing concerns? [x]  []  Jan with covid had swallowing difficulty, this is now improved  PEG? []  [x]     Referrals Yes No Comments  Social Work? []  [x]    Dentistry? [x]  []  Check up in April, everything was clean  Swallowing therapy? []  [x]    Nutrition? []  [x]    Med/Onc? []  [x]     Safety Issues Yes No Comments  Prior radiation? []  [x]    Pacemaker/ICD? []  [x]  Two steel pins in from tooth  Possible current pregnancy? []  [x]  Pt male  Is the patient on methotrexate? []  [x]     Tobacco/Marijuana/Snuff/ETOH use: Patient is a non-smoker with no tobacco use history.   Past/Anticipated interventions by otolaryngology, if any:  Dr. Irene Limbo on 06-14-22 Procedure: After adequate topical anesthetic was applied, 4 mm flexible laryngoscope was passed through the nasal cavity without difficulty. Flexible laryngoscopy shows patent anterior nasal cavity with minimal crusting, no discharge or infection.  Mass lesion of the right piriform sinus, which appears to extend onto the arytenoid and partially onto the area epiglottic fold. This does not obstruct the glottis.  Normal vocal cord mobility without vocal cord nodule, mass, polyp or tumor.   Impression & Plans:  Tin Engram is a 67 y.o. male with 12-month history of persistent right-sided neck mass, with recently performed imaging with cervical adenopathy and mass lesion of the right piriform  recess. Laryngoscopy performed today with findings as above. 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 Counseled patient regarding next steps, including proceeding with microlaryngoscopy and biopsy for tissue sampling. Patient was counseled that if head and neck cancer is confirmed,  neck step would be to proceed with PET to complete staging. All questions were answered. Patient currently takes a daily baby aspirin, which he will hold in anticipation of surgery, which I hope to schedule later this week.   Dr. Hezzie Bump 06-22-22 Patient is currently scheduled for consultation on 06/28/22 for Pyriform sinus mass and Squamous cell carcinoma of hypopharynx.   Past/Anticipated interventions by medical oncology, if any: Meets with Dr. Al Pimple on July 11th.   Current Complaints / other details:  Pt has Pet scan on Tuesday.

## 2022-06-23 NOTE — Progress Notes (Signed)
Oncology Nurse Navigator Documentation   Placed introductory call to new referral patient Randall [name].  Introduced myself as the H&N oncology nurse navigator that works with Dr. Basilio Cairo to whom he has been referred by Dr. Marene Lenz. He confirmed understanding of referral. Briefly explained my role as his navigator, provided my contact information.  Confirmed understanding that he will be called for an appointment with Dr. Basilio Cairo with Radiation Oncology.  I explained the purpose of a dental evaluation prior to starting RT, indicated he would be contacted by [facility] to arrange an appt.   I encouraged him to call with questions/concerns as he moves forward with appts and procedures.   He verbalized understanding of information provided, expressed appreciation for my call.    Hedda Slade RN, BSN, OCN Head & Neck Oncology Nurse Navigator [facility] at [facility] Phone # [phone]  Fax # [phone]

## 2022-06-24 ENCOUNTER — Inpatient Hospital Stay
Admission: RE | Admit: 2022-06-24 | Discharge: 2022-06-24 | Disposition: A | Discharge status: Home / Self Care | Payer: Self-pay | Source: Ambulatory Visit | Attending: Radiation Oncology | Admitting: Radiation Oncology

## 2022-06-24 ENCOUNTER — Other Ambulatory Visit: Payer: Self-pay

## 2022-06-24 DIAGNOSIS — J387 Other diseases of larynx: Secondary | ICD-10-CM

## 2022-06-29 ENCOUNTER — Other Ambulatory Visit: Payer: Self-pay

## 2022-06-29 DIAGNOSIS — C12 Malignant neoplasm of pyriform sinus: Secondary | ICD-10-CM

## 2022-06-29 NOTE — Progress Notes (Signed)
Oncology Nurse Navigator Documentation   I contacted Deanna Artis with Pathology at [facility] and requested that p 16 testing be added to his Biopsy that was completed on 06/17/22. She will request that test from the Pathologist.   Hedda Slade RN, BSN, OCN Head & Neck Oncology Nurse Navigator [facility] at [facility] Phone # [phone]  Fax # [phone]

## 2022-06-30 ENCOUNTER — Ambulatory Visit: Admission: RE | Admit: 2022-06-30 | Payer: Medicare HMO | Source: Ambulatory Visit

## 2022-06-30 LAB — SURGICAL PATHOLOGY

## 2022-07-01 ENCOUNTER — Encounter: Payer: Self-pay | Admitting: Radiation Oncology

## 2022-07-01 ENCOUNTER — Telehealth: Payer: Self-pay

## 2022-07-01 NOTE — Telephone Encounter (Signed)
Rn called pt to obtain meaningful use and nurse evaluation information. Detailed note completed and routed to Dr. Basilio Cairo.

## 2022-07-02 NOTE — Progress Notes (Signed)
Radiation Oncology         [phone] ________________________________  Initial Outpatient Consultation  Name: Randall [name] MRN: [id number]  Date: 07/04/2022  DOB: 08-28-1955  CC:Barbie Banner, MD  Skotnicki, Meghan A, DO   REFERRING PHYSICIAN: Skotnicki, Meghan A, DO  DIAGNOSIS:    ICD-10-CM   1. Malignant neoplasm of pyriform sinus (HCC)  C12     2. 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 male who presented to medical attention with bilateral neck swelling in January 2024. Around that time, the patient was diagnosed with COVID which caused him to have severe throat pain. He subsequently attributed his symptoms to an ongoing infection and his swelling subsided on the left side. His right neck swelling however continued and he presented to his PCP in early April for further evaluation.   A soft tissue head and neck ultrasound was performed on 05/05/22 which demonstrated a nonspecific 3.2 x 1.5 x 2.5 cm  hypoechoic lesion in the right neck, correlating with the palpable site of concern.   Soft tissue neck CT with contrast on 05/25/22 further demonstrated a soft tissue mass centered within the right piriform sinus,  measuring up to 22 x 8 mm, concerning for malignancy. CT also demonstrated a right level 3 lymph node measuring up to 2.8 x 1.8 cm, with central necrosis (correlating with the hypoechoic mass seen on neck ultrasound) suspicious for metastatic disease, and indeterminate asymmetric prominence of right level 2A lymph node, measuring up  to 16 x 10 mm      Subsequently, the patient was referred to Dr. Marene Lenz on 06/14/22 for further evaluation and management. Laryngoscopy performed during this visit revealed a mass lesion of the right piriform sinus which appeared to extend into the arytenoid and partially onto the area epiglottic fold. (This did not appear to obstruct the glottis).   Accordingly, the patient underwent direct laryngoscopy under anesthesia to obtain biopsies on 06/17/22. Biopsy of the right pyriform sinus showed findings consistent with invasive poorly differentiated squamous cell carcinoma (nonkeratinizing). A post cricoid biopsy was also collected and showed no evidence of malignancy.  He recently met with Dr. Hezzie Bump on 06/28/22 to discuss treatment options. Options reviewed included initiating treatment with surgery (resection of the primary tumor w/ bilateral neck dissection), followed by adjuvant therapy as indicated by pathology results, vs beginning with chemoradiotherapy with surgery reserved for salvage treatment. Dr. Hezzie Bump also performed a repeat laryngoscopy during this visit which redemonstrated the nodule mass along the right aryepiglottic fold and medial wall of the hypopharynx. This also appeared to overhang and likely involve the arytenoid   A PET scan has been ordered. This has yet to be officially scheduled. PET findings will be a determining factor in his treatment course.   Swallowing issues, if any: none  Weight Changes: none  Pain status: ***  Other symptoms: right neck swelling  Tobacco history, if any: non-smoker with no tobacco use history   ETOH abuse, if any: denies alcohol consumption and use of recreational drugs   Prior cancers, if any: none  PREVIOUS RADIATION THERAPY: No  PAST MEDICAL HISTORY:  has a past medical history of Complication of anesthesia, Diabetes mellitus without complication (HCC), Elevated coronary artery calcium score (02/02/2021), GERD (gastroesophageal reflux  disease), History of kidney stones (2014), Hypertension, Kidney stone (06/17/2015), PONV (postoperative nausea and vomiting), Rupture of right quadriceps tendon (09/21/2020), Tuberculosis (1980), and Type 2 diabetes mellitus without complication, without long-term current use of insulin (HCC) (03/21/2020).    PAST SURGICAL HISTORY: Past Surgical History:  Procedure Laterality Date   KIDNEY SURGERY     KNEE SURGERY     LITHOTRIPSY     MICROLARYNGOSCOPY Bilateral 06/17/2022   Procedure: DIRECT LARYNGOSCOPY WITH BIOPSY;  Surgeon: Laren Boom, DO;  Location: [facility] OR;  Service: ENT;  Laterality: Bilateral;   QUADRICEPS TENDON REPAIR Right 09/25/2020   Procedure: REPAIR RIGHT QUADRICEP TENDON;  Surgeon: Nadara Mustard, MD;  Location: [facility];  Service: Orthopedics;  Laterality: Right;   TONSILLECTOMY      FAMILY HISTORY: family history is not on file.  SOCIAL HISTORY:  reports that he has never smoked. He has never used smokeless tobacco. He reports that he does not drink alcohol and does not use drugs.  ALLERGIES: Norgesic forte [orphenadrine-aspirin-caffeine] and Latex  MEDICATIONS:  Current Outpatient Medications  Medication Sig Dispense Refill   acetaminophen (TYLENOL) 500 MG tablet Take 500-1,000 mg by mouth every 6 (six) hours as needed for moderate pain or headache.     amLODipine (NORVASC) 10 MG tablet Take 10 mg by mouth daily.     aspirin EC 81 MG tablet Take 81 mg by mouth daily. Swallow whole.     atorvastatin (LIPITOR) 20 MG tablet Take 20 mg by mouth daily.     carvedilol (COREG) 25 MG tablet Take 25 mg by mouth 2 (two) times daily with a meal.     cetirizine (ZYRTEC) 10 MG tablet Take 10 mg by mouth daily as needed for allergies. 24 hour     chlorthalidone (HYGROTON) 25 MG tablet TAKE 1/2 TABLET BY MOUTH EVERY DAY 45 tablet 3   glimepiride (AMARYL) 4 MG tablet Take 4 mg by mouth daily with breakfast.     glucose blood (FREESTYLE LITE) test strip Test glucose BID   Diagnoses  Code: E11.9     meloxicam (MOBIC) 15 MG tablet Take 15 mg by mouth daily.     metFORMIN (GLUCOPHAGE-XR) 500 MG 24 hr tablet Take 1,000 mg by mouth 2 (two) times daily.     OVER THE COUNTER MEDICATION Take 2 capsules by mouth daily. xEO Mega Complex     OVER THE COUNTER MEDICATION Take 2 capsules by mouth daily. Alpha crs plus supplement     OVER THE COUNTER MEDICATION Take 2 capsules by mouth daily. Microplex vmz supplement     pantoprazole (PROTONIX) 40 MG tablet Take 40 mg by mouth daily.     potassium citrate (UROCIT-K) 10 MEQ (1080 MG) SR tablet Take 10 mEq by mouth 2 (two) times daily.     tadalafil (CIALIS) 20 MG tablet Take 20 mg by mouth daily as needed for erectile dysfunction.     telmisartan (MICARDIS) 80 MG tablet Take 80 mg by mouth daily.     triamcinolone cream (KENALOG) 0.1 % Apply 1 application topically 3 (three) times daily as needed for itching (rash).     TRULICITY 1.5 MG/0.5ML SOPN Inject 1.5 mg as directed once a week. Thursday morning     No current facility-administered medications for this encounter.    REVIEW OF SYSTEMS:  Notable  for that above.   PHYSICAL EXAM:  height is 5\' 8"  (1.727 m) and weight is 200 lb 8 oz (90.9 kg). His temporal temperature is 97.7 F (36.5 C). His blood pressure is 150/77 (abnormal) and his pulse is 70. His respiration is 18 and oxygen saturation is 100%.   General: Alert and oriented, in no acute distress HEENT: Head is normocephalic. Extraocular movements are intact. Oropharynx is notable for ***. Neck: Neck is notable for *** Heart: Regular in rate and rhythm with no murmurs, rubs, or gallops. Chest: Clear to auscultation bilaterally, with no rhonchi, wheezes, or rales. Abdomen: Soft, nontender, nondistended, with no rigidity or guarding. Extremities: No cyanosis or edema. Lymphatics: see Neck Exam Skin: No concerning lesions. Musculoskeletal: symmetric strength and muscle tone throughout. Neurologic: Cranial nerves II through XII  are grossly intact. No obvious focalities. Speech is fluent. Coordination is intact. Psychiatric: Judgment and insight are intact. Affect is appropriate.   ECOG = ***  0 - Asymptomatic (Fully active, able to carry on all predisease activities without restriction)  1 - Symptomatic but completely ambulatory (Restricted in physically strenuous activity but ambulatory and able to carry out work of a light or sedentary nature. For example, light housework, office work)  2 - Symptomatic, <50% in bed during the day (Ambulatory and capable of all self care but unable to carry out any work activities. Up and about more than 50% of waking hours)  3 - Symptomatic, >50% in bed, but not bedbound (Capable of only limited self-care, confined to bed or chair 50% or more of waking hours)  4 - Bedbound (Completely disabled. Cannot carry on any self-care. Totally confined to bed or chair)  5 - Death   Santiago Glad MM, Creech RH, Tormey DC, et al. [phone]). "Toxicity and response criteria of the [facility]". Am. Evlyn Clines. Oncol. 5 (6): 649-55   LABORATORY DATA:  Lab Results  Component Value Date   WBC 8.5 06/17/2022   HGB 15.3 06/17/2022   HCT 43.6 06/17/2022   MCV 91.8 06/17/2022   PLT 187 06/17/2022   CMP     Component Value Date/Time   NA 136 06/17/2022 1001   NA 141 02/02/2021 1050   K 3.7 06/17/2022 1001   CL 100 06/17/2022 1001   CO2 24 06/17/2022 1001   GLUCOSE 188 (H) 06/17/2022 1001   BUN 15 06/17/2022 1001   BUN 12 02/02/2021 1050   CREATININE 0.81 06/17/2022 1001   CALCIUM 9.6 06/17/2022 1001   PROT 6.0 03/07/2010 0515   ALBUMIN 2.2 (L) 03/07/2010 0515   AST 23 03/07/2010 0515   ALT 34 03/07/2010 0515   ALKPHOS 83 03/07/2010 0515   BILITOT 1.1 03/07/2010 0515   GFRNONAA >60 06/17/2022 1001   GFRAA  03/07/2010 0515    >60        The eGFR has been calculated using the MDRD equation. This calculation has not been validated in all clinical situations. eGFR's  persistently <60 mL/min signify possible Chronic Kidney Disease.      No results found for: "TSH"   RADIOGRAPHY: No results found.    IMPRESSION/PLAN:  This is a delightful patient with head and neck cancer. I *** recommend radiotherapy for this patient.  We discussed the potential risks, benefits, and side effects of radiotherapy. We talked in detail about acute and late effects. We discussed that some of the most bothersome acute effects may be mucositis, dysgeusia, salivary changes, skin irritation, hair loss, dehydration, weight loss and  fatigue. We talked about late effects which include but are not necessarily limited to dysphagia, hypothyroidism, nerve injury, vascular injury, spinal cord injury, xerostomia, trismus, neck edema, and potential injury to any of the tissues in the head and neck region. No guarantees of treatment were given. A consent form was signed and placed in the patient's medical record. The patient is enthusiastic about proceeding with treatment. I look forward to participating in the patient's care.    Simulation (treatment planning) will take place ***  We also discussed that the treatment of head and neck cancer is a multidisciplinary process to maximize treatment outcomes and quality of life. For this reason the following referrals have been or will be made:  *** Medical oncology to discuss chemotherapy   *** Dentistry for dental evaluation, possible extractions in the radiation fields, and /or advice on reducing risk of cavities, osteoradionecrosis, or other oral issues.  *** Nutritionist for nutrition support during and after treatment.  *** Speech language pathology for swallowing and/or speech therapy.  *** Social work for social support.   *** Physical therapy due to risk of lymphedema in neck and deconditioning.  *** Baseline labs including TSH.  On date of service, in total, I spent *** minutes on this encounter. Patient was seen in  person.  __________________________________________   Lonie Peak, MD  This document serves as a record of services personally performed by Lonie Peak, MD. It was created on her behalf by Neena Rhymes, a trained medical scribe. The creation of this record is based on the scribe's personal observations and the provider's statements to them. This document has been checked and approved by the attending provider.

## 2022-07-04 ENCOUNTER — Other Ambulatory Visit: Payer: Self-pay

## 2022-07-04 ENCOUNTER — Ambulatory Visit
Admission: RE | Admit: 2022-07-04 | Discharge: 2022-07-04 | Disposition: A | Discharge status: Home / Self Care | Payer: Medicare HMO | Source: Ambulatory Visit | Attending: Radiation Oncology | Admitting: Radiation Oncology

## 2022-07-04 VITALS — BP 150/77 | HR 70 | Temp 97.7°F | Resp 18 | Ht 68.0 in | Wt 200.5 lb

## 2022-07-04 DIAGNOSIS — E119 Type 2 diabetes mellitus without complications: Secondary | ICD-10-CM | POA: Diagnosis not present

## 2022-07-04 DIAGNOSIS — C12 Malignant neoplasm of pyriform sinus: Secondary | ICD-10-CM | POA: Insufficient documentation

## 2022-07-04 DIAGNOSIS — Z791 Long term (current) use of non-steroidal anti-inflammatories (NSAID): Secondary | ICD-10-CM | POA: Insufficient documentation

## 2022-07-04 DIAGNOSIS — Z87442 Personal history of urinary calculi: Secondary | ICD-10-CM | POA: Diagnosis not present

## 2022-07-04 DIAGNOSIS — Z7984 Long term (current) use of oral hypoglycemic drugs: Secondary | ICD-10-CM | POA: Diagnosis not present

## 2022-07-04 DIAGNOSIS — K219 Gastro-esophageal reflux disease without esophagitis: Secondary | ICD-10-CM | POA: Diagnosis not present

## 2022-07-04 DIAGNOSIS — Z7982 Long term (current) use of aspirin: Secondary | ICD-10-CM | POA: Diagnosis not present

## 2022-07-04 DIAGNOSIS — Z79899 Other long term (current) drug therapy: Secondary | ICD-10-CM | POA: Diagnosis not present

## 2022-07-04 DIAGNOSIS — Z7985 Long-term (current) use of injectable non-insulin antidiabetic drugs: Secondary | ICD-10-CM | POA: Insufficient documentation

## 2022-07-04 DIAGNOSIS — I10 Essential (primary) hypertension: Secondary | ICD-10-CM | POA: Insufficient documentation

## 2022-07-04 NOTE — Progress Notes (Signed)
No changes since conversation on Friday with pt over telephone.   Vitals:   07/04/22 0758  BP: (!) 150/77  Pulse: 70  Resp: 18  Temp: 97.7 F (36.5 C)  SpO2: 100%   Wt Readings from Last 3 Encounters:  07/04/22 200 lb 8 oz (90.9 kg)  06/17/22 200 lb (90.7 kg)  02/18/22 206 lb 12.8 oz (93.8 kg)

## 2022-07-05 ENCOUNTER — Encounter: Payer: Self-pay | Admitting: Radiation Oncology

## 2022-07-05 ENCOUNTER — Telehealth: Payer: Self-pay | Admitting: Hematology and Oncology

## 2022-07-05 NOTE — Telephone Encounter (Signed)
Spoke with patient confirming upcoming appointment change  

## 2022-07-07 ENCOUNTER — Other Ambulatory Visit: Payer: Self-pay

## 2022-07-07 ENCOUNTER — Inpatient Hospital Stay
Admission: RE | Admit: 2022-07-07 | Discharge: 2022-07-07 | Disposition: A | Discharge status: Home / Self Care | Payer: Self-pay | Source: Ambulatory Visit | Attending: Radiation Oncology | Admitting: Radiation Oncology

## 2022-07-07 DIAGNOSIS — C12 Malignant neoplasm of pyriform sinus: Secondary | ICD-10-CM

## 2022-07-08 ENCOUNTER — Inpatient Hospital Stay: Payer: Medicare HMO | Attending: Hematology and Oncology | Admitting: Hematology and Oncology

## 2022-07-08 ENCOUNTER — Inpatient Hospital Stay: Payer: Medicare HMO

## 2022-07-08 ENCOUNTER — Other Ambulatory Visit: Payer: Self-pay

## 2022-07-08 VITALS — BP 136/71 | HR 68 | Temp 98.5°F | Resp 16 | Wt 200.6 lb

## 2022-07-08 DIAGNOSIS — C12 Malignant neoplasm of pyriform sinus: Secondary | ICD-10-CM

## 2022-07-08 DIAGNOSIS — C139 Malignant neoplasm of hypopharynx, unspecified: Secondary | ICD-10-CM

## 2022-07-08 NOTE — Progress Notes (Signed)
[facility] CONSULT NOTE  Patient Care Team: Barbie Banner, MD as PCP - General (Family Medicine) Marykay Lex, MD as PCP - Cardiology (Cardiology) Barbie Banner, MD (Family Medicine) Laren Boom, DO as Consulting Physician (Otolaryngology) Malmfelt, Lise Auer, RN as Oncology Nurse Navigator Lonie Peak, MD as Consulting Physician (Radiation Oncology)  CHIEF COMPLAINTS/PURPOSE OF CONSULTATION:  SCC hypopharynx  ASSESSMENT & PLAN:   This is a pleasant 67 year old male patient with well-controlled type 2 diabetes mellitus with new diagnosis of squamous cell carcinoma of the hypopharynx referred to medical oncology for recommendations.  He had a PET/CT without any evidence of distant metastatic disease.  He is currently staged as T2 N2b given more than 1 possible ipsilateral lymphadenopathy consistent with metastatic disease.  We have today discussed about options for treatment and there is locally advanced hypopharyngeal cancer which consist of surgery followed by adjuvant radiation plus or minus chemo if he has extranodal extension or positive margins.  Other option would be to consider concurrent weekly chemotherapy radiation followed by salvage surgery if he has residual disease.  We have discussed about role of cisplatin being a radiosensitizer in the treatment of head and neck cancer.  We have discussed about the curative intent of chemoradiation for this patient.  We have discussed about mechanism of action of cisplatin, adverse effects of cisplatin including but not limited to fatigue, nausea, vomiting, increased risk of infections, mucositis, ototoxicity, nephrotoxicity, peripheral neuropathy.  Patient understands that some of the side effects can be permanent and fatal.  We have discussed about role of Mediport and G-tube for chemotherapy administration and nutrition respectively since most of these patients have severe mucositis during the treatment.  At  this time he will try to talk to Dr. Hezzie Bump again and keep [name] posted if he would like to proceed with concurrent chemoradiation.  He understands the possibility of needing adjuvant chemoradiation after surgery if he has any adverse pathological features.  Thank you for consulting [name] in the care of this patient.  Please not hesitate to contact us with any additional questions or concerns   HISTORY OF PRESENTING ILLNESS:  Randall [name] 67 y.o. male is here because of SCC hypopharynx.  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  He initially presented with bilateral neck swelling in January 2024 when he had COVID.  His left neck swelling subsided but the right neck swelling persisted hence he underwent further investigation.  He had an ultrasound, CT which demonstrated a soft tissue mass in the right pyriform sinus.  CT also demonstrated a right level 3 lymph node measuring up to 2.8 x 1.8 cm with central necrosis suspicious for metastatic disease as well as an indeterminate asymmetric prominence of the right level 2A lymph node measuring up to 16 x 10 mm.  He subsequently was referred to ENT and had laryngoscopy as an biopsy which consistent with poorly differentiated invasive squamous cell carcinoma, nonkeratinizing subtype.  He followed up with ENT as well as Dr. Basilio Cairo from radiation oncology the recommendations were surgery followed by adjuvant RT plus or minus chemo versus chemo RT followed by salvage surgery for residual disease.  He is here to discuss about the chemotherapy part of it.  He had a PET/CT, no official report available but according to my read there is no evidence of metastatic disease.  He has very well-controlled  type 2 diabetes mellitus, last hemoglobin A1c was 5.5%.  Sizes he is very healthy at baseline.  He is retired.  He denies any history of  smoking.  Rest of the pertinent 10 point ROS reviewed and negative  MEDICAL HISTORY:  Past Medical History:  Diagnosis Date   Complication of anesthesia    Diabetes mellitus without complication (HCC)    Elevated coronary artery calcium score 02/02/2021   GERD (gastroesophageal reflux disease)    History of kidney stones 2014   Hypertension    Kidney stone 06/17/2015   PONV (postoperative nausea and vomiting)    Rupture of right quadriceps tendon 09/21/2020   - s/p srugical repair   Tuberculosis 1980   Type 2 diabetes mellitus without complication, without long-term current use of insulin (HCC) 03/21/2020    SURGICAL HISTORY: Past Surgical History:  Procedure Laterality Date   KIDNEY SURGERY     KNEE SURGERY     LITHOTRIPSY     MICROLARYNGOSCOPY Bilateral 06/17/2022   Procedure: DIRECT LARYNGOSCOPY WITH BIOPSY;  Surgeon: Laren Boom, DO;  Location: [facility] OR;  Service: ENT;  Laterality: Bilateral;   QUADRICEPS TENDON REPAIR Right 09/25/2020   Procedure: REPAIR RIGHT QUADRICEP TENDON;  Surgeon: Nadara Mustard, MD;  Location: [facility] OR;  Service: Orthopedics;  Laterality: Right;   TONSILLECTOMY      SOCIAL HISTORY: Social History   Socioeconomic History   Marital status: Married    Spouse name: Not on file   Number of children: Not on file   Years of education: Not on file   Highest education level: Not on file  Occupational History   Not on file  Tobacco Use   Smoking status: Never   Smokeless tobacco: Never  Vaping Use   Vaping Use: Never used  Substance and Sexual Activity   Alcohol use: Never   Drug use: Never   Sexual activity: Not on file  Other Topics Concern   Not on file  Social History Narrative   Usually very active - but no "routine exercise".   Social Determinants of Health   Financial Resource Strain: Not on file  Food Insecurity: No Food Insecurity (07/01/2022)   Hunger Vital Sign    Worried About Running Out of Food in the Last Year: Never true     Ran Out of Food in the Last Year: Never true  Transportation Needs: No Transportation Needs (07/01/2022)   PRAPARE - Administrator, Civil Service (Medical): No    Lack of Transportation (Non-Medical): No  Physical Activity: Not on file  Stress: Not on file  Social Connections: Not on file  Intimate Partner Violence: Not At Risk (07/01/2022)   Humiliation, Afraid, Rape, and Kick questionnaire    Fear of Current or Ex-Partner: No    Emotionally Abused: No    Physically Abused: No    Sexually Abused: No    FAMILY HISTORY: No family history on file.  ALLERGIES:  is allergic to norgesic forte [orphenadrine-aspirin-caffeine] and latex.  MEDICATIONS:  Current Outpatient Medications  Medication Sig Dispense Refill   acetaminophen (TYLENOL) 500 MG tablet Take 500-1,000 mg by mouth every 6 (six) hours as needed for moderate pain or headache.     amLODipine (NORVASC) 10 MG tablet Take 10 mg by mouth daily.     aspirin EC 81 MG tablet Take 81 mg by mouth daily. Swallow whole.     atorvastatin (LIPITOR) 20 MG tablet Take 20 mg by mouth daily.  carvedilol (COREG) 25 MG tablet Take 25 mg by mouth 2 (two) times daily with a meal.     cetirizine (ZYRTEC) 10 MG tablet Take 10 mg by mouth daily as needed for allergies. 24 hour     chlorthalidone (HYGROTON) 25 MG tablet TAKE 1/2 TABLET BY MOUTH EVERY DAY 45 tablet 3   glimepiride (AMARYL) 4 MG tablet Take 4 mg by mouth daily with breakfast.     glucose blood (FREESTYLE LITE) test strip Test glucose BID   Diagnoses Code: E11.9     meloxicam (MOBIC) 15 MG tablet Take 15 mg by mouth daily.     metFORMIN (GLUCOPHAGE-XR) 500 MG 24 hr tablet Take 1,000 mg by mouth 2 (two) times daily.     OVER THE COUNTER MEDICATION Take 2 capsules by mouth daily. xEO Mega Complex     OVER THE COUNTER MEDICATION Take 2 capsules by mouth daily. Alpha crs plus supplement     OVER THE COUNTER MEDICATION Take 2 capsules by mouth daily. Microplex vmz  supplement     pantoprazole (PROTONIX) 40 MG tablet Take 40 mg by mouth daily.     potassium citrate (UROCIT-K) 10 MEQ (1080 MG) SR tablet Take 10 mEq by mouth 2 (two) times daily.     tadalafil (CIALIS) 20 MG tablet Take 20 mg by mouth daily as needed for erectile dysfunction.     telmisartan (MICARDIS) 80 MG tablet Take 80 mg by mouth daily.     triamcinolone cream (KENALOG) 0.1 % Apply 1 application topically 3 (three) times daily as needed for itching (rash).     TRULICITY 1.5 MG/0.5ML SOPN Inject 1.5 mg as directed once a week. Thursday morning     No current facility-administered medications for this visit.     PHYSICAL EXAMINATION: ECOG PERFORMANCE STATUS: 0 - Asymptomatic  Vitals:   07/08/22 1155  BP: 136/71  Pulse: 68  Resp: 16  Temp: 98.5 F (36.9 C)  SpO2: 100%   Filed Weights   07/08/22 1155  Weight: 200 lb 9.6 oz (91 kg)    GENERAL:alert, no distress and comfortable SKIN: skin color, texture, turgor are normal, no rashes or significant lesions EYES: normal, conjunctiva are pink and non-injected, sclera clear OROPHARYNX:no exudate, no erythema and lips, buccal mucosa, and tongue normal  NECK: supple, thyroid normal size, non-tender, without nodularity LYMPH: Palpable right cervical lymphadenopathy measuring over 2 cm. LUNGS: clear to auscultation and percussion with normal breathing effort HEART: regular rate & rhythm and no murmurs and no lower extremity edema ABDOMEN:abdomen soft, non-tender and normal bowel sounds Musculoskeletal:no cyanosis of digits and no clubbing  PSYCH: alert & oriented x 3 with fluent speech NEURO: no focal motor/sensory deficits  LABORATORY DATA:  I have reviewed the data as listed Lab Results  Component Value Date   WBC 8.5 06/17/2022   HGB 15.3 06/17/2022   HCT 43.6 06/17/2022   MCV 91.8 06/17/2022   PLT 187 06/17/2022     Chemistry      Component Value Date/Time   NA 136 06/17/2022 1001   NA 141 02/02/2021 1050   K 3.7  06/17/2022 1001   CL 100 06/17/2022 1001   CO2 24 06/17/2022 1001   BUN 15 06/17/2022 1001   BUN 12 02/02/2021 1050   CREATININE 0.81 06/17/2022 1001      Component Value Date/Time   CALCIUM 9.6 06/17/2022 1001   ALKPHOS 83 03/07/2010 0515   AST 23 03/07/2010 0515   ALT 34 03/07/2010 0515  BILITOT 1.1 03/07/2010 0515       RADIOGRAPHIC STUDIES: I have personally reviewed the radiological images as listed and agreed with the findings in the report. No results found.  All questions were answered. The patient knows to call the clinic with any problems, questions or concerns. I spent 60 minutes in the care of this patient including H and P, review of records, counseling and coordination of care.     Rachel Moulds, MD 07/08/2022 12:04 PM

## 2022-07-08 NOTE — Progress Notes (Signed)
Oncology Nurse Navigator Documentation   Met with patient during initial consult with Dr. Al Pimple. He was accompanied by his wife.  Further introduced myself as his/their Navigator, explained my role as a member of the Care Team. Provided New Patient resource guide binder: Contact information for physicians, this navigator, other members of the Care Team Advance Directive information; provided [facility] AD booklet at their request,  Fall Prevention Patient Safety Plan Financial Assistance Information sheet Symptom Management Clinic information [facility] map with highlight of [facility] SLP Information sheet Head and Neck cancer basics Nutrition information Patient and family support information including Spiritual care/Chaplain information, Peer mentor program, health and wellness classes, and the survivorship program Community resources  Provided and discussed educational handouts for PEG and PAC. He has already seen [facility] and been cleared for treatment and had impressions made for SPDs which he will pick up on 07/11/22. Assisted with post-consult appt scheduling. They verbalized understanding of information provided. I encouraged them to call with questions/concerns moving forward.  Hedda Slade, RN, BSN, OCN Head & Neck Oncology Nurse Navigator [facility] [place] [phone]

## 2022-07-21 ENCOUNTER — Ambulatory Visit: Payer: Medicare HMO | Admitting: Hematology and Oncology

## 2022-07-21 ENCOUNTER — Other Ambulatory Visit: Payer: Medicare HMO

## 2022-07-25 ENCOUNTER — Other Ambulatory Visit (HOSPITAL_COMMUNITY): Payer: Self-pay | Admitting: Otolaryngology

## 2022-07-25 DIAGNOSIS — C139 Malignant neoplasm of hypopharynx, unspecified: Secondary | ICD-10-CM

## 2022-08-14 ENCOUNTER — Other Ambulatory Visit: Payer: Self-pay | Admitting: Cardiology

## 2022-08-26 DIAGNOSIS — C329 Malignant neoplasm of larynx, unspecified: Secondary | ICD-10-CM | POA: Insufficient documentation

## 2022-09-20 NOTE — Progress Notes (Signed)
Radiation Oncology         ([phone]) [phone] ________________________________  Follow-up New Visit   Outpatient   Name: Randall [name] MRN: [id number]  Date: 09/21/2022  DOB: 10-28-1955  CC:Randall Banner, MD  Randall Skains, MD   REFERRING PHYSICIAN: Corey Skains, MD  DIAGNOSIS: No diagnosis found.   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 male who returns today to further discuss the role of radiation therapy in management of his recently diagnosed hypopharyngeal cancer. To review from his initial consultation on 07/04/22, the patient opted to hold off on making a final treatment decision pending evaluations by medical oncology to discuss the role of chemotherapy (which we discussed would be concurrent with radiation therapy if pursued), as well as further discussion with Randall [name] to help decide if he would like to pursue surgery. He also had pending staging work-up which we will review in detail today.   Since his last visit, the patient presented for a PET scan on 07/07/22 which demonstrated the right piriform sinus primary measuring 1.4 cm with an SUV max of 5.9, and the known level 3 ipsilateral nodal metastasis measuring 2.7 cm with an SUV max of 3.2. PET otherwise showed no evidence of extracervical hypermetabolic metastatic disease, and a nonspecific 2 mm LLL pulmonary nodule.   He accordingly met with Randall [name] on 07/08/22 to discuss  systemic treatment options. The option of cisplatin as chemotherapy was reviewed with the patient at that time. The indication of systemic treatment based on final surgical pathology was also reiterated to the patient.    He ultimately opted to proceed with endoscopic CO2 laser excision of the hypopharyngeal mass and bilateral neck dissection on 08/26/22 under the care of Randall [name]. Pathology from the procedure revealed:  -- Right arytenoid and hypopharynx excision: HPV associated squamous cell carcinoma  -- Right neck levels 2A, 3, 4, dissection: 1/22 lymph nodes positive for metastatic carcinoma (nodal metastatic deposit measuring 4.0 cm); negative for extranodal extension  -- Right neck level 2B and left neck levels 2A and 3 dissection: 10/10 lymph nodes negative for carcinoma.   Based on final pathology showing no evidence of extranodal extension in the positive excised 4.0 lymph node, chemotherapy is not recommended. Randall [name] has recommended that he proceed with adjuvant radiation therapy which we will discuss further today.   ***  HPI Initial Consultation - 07/04/22 ::Randall [name] is a 67 y.o. male who presented to medical attention with bilateral neck swelling in January 2024. Around that time, the patient was diagnosed with COVID which caused him to have severe throat pain. He subsequently attributed his symptoms to an ongoing infection and his swelling subsided on the left side. His right neck swelling however continued and he presented to his PCP in early April for further evaluation.    A soft tissue head and neck ultrasound was performed on 05/05/22 which demonstrated a nonspecific 3.2 x 1.5 x 2.5 cm  hypoechoic lesion in the right neck, correlating with the  palpable site of concern.    Soft tissue neck CT with contrast on 05/25/22 further demonstrated a soft tissue mass centered within the right piriform sinus,  measuring up to 22 x 8 mm, concerning for malignancy. CT also  demonstrated a right level 3 lymph node measuring up to 2.8 x 1.8 cm, with central necrosis (correlating with the hypoechoic mass seen on neck ultrasound) suspicious for metastatic disease, and indeterminate asymmetric prominence of right level 2A lymph node, measuring up  to 16 x 10 mm      Subsequently, the patient was referred to Randall [name] on 06/14/22 for further evaluation and management. Laryngoscopy performed during this visit revealed a mass lesion of the right piriform sinus which appeared to extend into the arytenoid and partially onto the area epiglottic fold. (This did not appear to obstruct the glottis).    Accordingly, the patient underwent direct laryngoscopy under anesthesia to obtain biopsies on 06/17/22. Biopsy of the right pyriform sinus showed findings consistent with invasive poorly differentiated squamous cell carcinoma (nonkeratinizing). A post cricoid biopsy was also collected and showed no evidence of malignancy.   He recently met with Randall [name] on 06/28/22 to discuss treatment options. Options reviewed included initiating treatment with surgery (resection of the primary tumor w/ bilateral neck dissection), followed by adjuvant therapy (likely RT, less likely chemo) as indicated by pathology results, vs beginning with chemoradiotherapy with surgery reserved for salvage treatment. Randall [name] also performed a repeat laryngoscopy during this visit which redemonstrated the nodule mass along the right aryepiglottic fold and medial wall of the hypopharynx. This also appeared to overhang and likely involve the arytenoid    A PET scan has been ordered. It was switched to [facility] and will occur this week. PET findings will be a determining factor in his treatment course.    Swallowing issues, if any: none   Weight Changes: he does report weight loss r/t Trulicity and will discuss w/ PCP the importance of minimizing weight loss given his recent cancer dx    Wt Readings from Last 3  Encounters:  07/04/22 200 lb 8 oz (90.9 kg)  06/17/22 200 lb (90.7 kg)  02/18/22 206 lb 12.8 oz (93.8 kg)    Other symptoms: right neck swelling   Tobacco history, if any: non-smoker with no tobacco use history    ETOH abuse, if any: denies alcohol consumption and use of recreational drugs    Prior cancers, if any: none   PREVIOUS RADIATION THERAPY: No  PAST MEDICAL HISTORY:  has a past medical history of Complication of anesthesia, Diabetes mellitus without complication (HCC), Elevated coronary artery calcium score (02/02/2021), GERD (gastroesophageal reflux disease), History of kidney stones (2014), Hypertension, Kidney stone (06/17/2015), PONV (postoperative nausea and vomiting), Rupture of right quadriceps tendon (09/21/2020), Tuberculosis (1980), and Type 2 diabetes mellitus without complication, without long-term current use of insulin (HCC) (03/21/2020).    PAST SURGICAL HISTORY: Past Surgical History:  Procedure Laterality Date   KIDNEY SURGERY     KNEE SURGERY     LITHOTRIPSY     MICROLARYNGOSCOPY Bilateral 06/17/2022   Procedure: DIRECT LARYNGOSCOPY WITH BIOPSY;  Surgeon: Laren Boom, DO;  Location: [facility] OR;  Service: ENT;  Laterality: Bilateral;   QUADRICEPS TENDON REPAIR Right 09/25/2020   Procedure: REPAIR RIGHT QUADRICEP TENDON;  Surgeon: Nadara Mustard, MD;  Location: [facility] OR;  Service: Orthopedics;  Laterality: Right;   TONSILLECTOMY      FAMILY HISTORY: family history is not on file.  SOCIAL HISTORY:  reports that he has never smoked. He has never used smokeless tobacco. He reports that he does not drink alcohol and does not use drugs.  ALLERGIES: Norgesic forte [orphenadrine-aspirin-caffeine] and Latex  MEDICATIONS:  Current Outpatient Medications  Medication Sig Dispense Refill   acetaminophen (TYLENOL) 500 MG tablet Take 500-1,000 mg by mouth every 6 (six) hours as needed for moderate pain or headache.     amLODipine (NORVASC) 10 MG tablet Take 10 mg by  mouth daily.     aspirin EC 81 MG tablet Take 81 mg by mouth daily. Swallow whole.     atorvastatin (LIPITOR) 20 MG tablet Take 20 mg by mouth daily.     carvedilol (COREG) 25 MG tablet Take 25 mg by mouth 2 (two) times daily with a meal.     cetirizine (ZYRTEC) 10 MG tablet Take 10 mg by mouth daily as needed for allergies. 24 hour     chlorthalidone (HYGROTON) 25 MG tablet TAKE 1/2 TABLET BY MOUTH DAILY 45 tablet 3   glimepiride (AMARYL) 4 MG tablet Take 4 mg by mouth daily with breakfast.     glucose blood (FREESTYLE LITE) test strip Test glucose BID   Diagnoses Code: E11.9     meloxicam (MOBIC) 15 MG tablet Take 15 mg by mouth daily.     metFORMIN (GLUCOPHAGE-XR) 500 MG 24 hr tablet Take 1,000 mg by mouth 2 (two) times daily.     OVER THE COUNTER MEDICATION Take 2 capsules by mouth daily. xEO Mega Complex     OVER THE COUNTER MEDICATION Take 2 capsules by mouth daily. Alpha crs plus supplement     OVER THE COUNTER MEDICATION Take 2 capsules by mouth daily. Microplex vmz supplement     pantoprazole (PROTONIX) 40 MG tablet Take 40 mg by mouth daily.     potassium citrate (UROCIT-K) 10 MEQ (1080 MG) SR tablet Take 10 mEq by mouth 2 (two) times daily.     tadalafil (CIALIS) 20 MG tablet Take 20 mg by mouth daily as needed for erectile dysfunction.     telmisartan (MICARDIS) 80 MG tablet Take 80 mg by mouth daily.     triamcinolone cream (KENALOG) 0.1 % Apply 1 application topically 3 (three) times daily as needed for itching (rash).     TRULICITY 1.5 MG/0.5ML SOPN Inject 1.5 mg as directed once a week. Thursday morning     No current facility-administered medications for this encounter.    REVIEW OF SYSTEMS:  Notable for that above.   PHYSICAL EXAM:  vitals were not taken for this visit.   General: Alert and oriented, in no acute distress HEENT: Head is normocephalic. Extraocular movements are intact. Oropharynx is notable for ***. Neck: Neck is notable for *** Heart: Regular in rate and  rhythm with no murmurs, rubs, or gallops. Chest: Clear to auscultation bilaterally, with no rhonchi, wheezes, or rales. Abdomen: Soft, nontender, nondistended, with no rigidity or guarding. Extremities: No cyanosis or edema. Lymphatics: see Neck Exam Skin: No concerning lesions. Musculoskeletal: symmetric strength and muscle tone throughout. Neurologic: Cranial nerves II through XII are grossly intact. No obvious focalities. Speech is fluent. Coordination is intact. Psychiatric: Judgment and insight are intact. Affect is appropriate.   ECOG = ***  0 - Asymptomatic (Fully active, able to carry on all predisease activities without restriction)  1 - Symptomatic but completely ambulatory (Restricted in physically strenuous activity but ambulatory and able to carry out work of a light or sedentary nature. For example, light housework, office  work)  2 - Symptomatic, <50% in bed during the day (Ambulatory and capable of all self care but unable to carry out any work activities. Up and about more than 50% of waking hours)  3 - Symptomatic, >50% in bed, but not bedbound (Capable of only limited self-care, confined to bed or chair 50% or more of waking hours)  4 - Bedbound (Completely disabled. Cannot carry on any self-care. Totally confined to bed or chair)  5 - Death   Santiago Glad MM, Creech RH, Tormey DC, et al. [phone]). "Toxicity and response criteria of the [facility]". Am. Evlyn Clines. Oncol. 5 (6): 649-55   LABORATORY DATA:  Lab Results  Component Value Date   WBC 8.5 06/17/2022   HGB 15.3 06/17/2022   HCT 43.6 06/17/2022   MCV 91.8 06/17/2022   PLT 187 06/17/2022   CMP     Component Value Date/Time   NA 136 06/17/2022 1001   NA 141 02/02/2021 1050   K 3.7 06/17/2022 1001   CL 100 06/17/2022 1001   CO2 24 06/17/2022 1001   GLUCOSE 188 (H) 06/17/2022 1001   BUN 15 06/17/2022 1001   BUN 12 02/02/2021 1050   CREATININE 0.81 06/17/2022 1001   CALCIUM 9.6  06/17/2022 1001   PROT 6.0 03/07/2010 0515   ALBUMIN 2.2 (L) 03/07/2010 0515   AST 23 03/07/2010 0515   ALT 34 03/07/2010 0515   ALKPHOS 83 03/07/2010 0515   BILITOT 1.1 03/07/2010 0515   GFRNONAA >60 06/17/2022 1001   GFRAA  03/07/2010 0515    >60        The eGFR has been calculated using the MDRD equation. This calculation has not been validated in all clinical situations. eGFR's persistently <60 mL/min signify possible Chronic Kidney Disease.      No results found for: "TSH"   RADIOGRAPHY: No results found.    IMPRESSION/PLAN:  This is a delightful patient with head and neck cancer. I *** recommend radiotherapy for this patient.  We discussed the potential risks, benefits, and side effects of radiotherapy. We talked in detail about acute and late effects. We discussed that some of the most bothersome acute effects may be mucositis, dysgeusia, salivary changes, skin irritation, hair loss, dehydration, weight loss and fatigue. We talked about late effects which include but are not necessarily limited to dysphagia, hypothyroidism, nerve injury, vascular injury, spinal cord injury, xerostomia, trismus, neck edema, dental issues, non-healing wound, and potentially fatal injury to any of the tissues in the head and neck region. No guarantees of treatment were given. A consent form was signed and placed in the patient's medical record. The patient is enthusiastic about proceeding with treatment. I look forward to participating in the patient's care.    Simulation (treatment planning) will take place ***  We also discussed that the treatment of head and neck cancer is a multidisciplinary process to maximize treatment outcomes and quality of life. For this reason the following referrals have been or will be made:  *** Medical oncology to discuss chemotherapy   *** Dentistry for dental evaluation, possible extractions in the radiation fields, and /or advice on reducing risk of cavities,  osteoradionecrosis, or other oral issues.  *** Nutritionist for nutrition support during and after treatment.  *** Speech language pathology for swallowing and/or speech therapy.  *** Social work for social support.   *** Physical therapy due to risk of lymphedema in neck and deconditioning.  *** Baseline labs including TSH.  On date  of service, in total, I spent *** minutes on this encounter. Patient was seen in person.  __________________________________________   Lonie Peak, MD  This document serves as a record of services personally performed by Lonie Peak, MD. It was created on her behalf by Neena Rhymes, a trained medical scribe. The creation of this record is based on the scribe's personal observations and the provider's statements to them. This document has been checked and approved by the attending provider.

## 2022-09-20 NOTE — Progress Notes (Incomplete)
Head and Neck Cancer Location of Tumor / Histology: hypopharynx, right neck  He had an ultrasound, CT which demonstrated a soft tissue mass in the right pyriform sinus.  CT also demonstrated a right level 3 lymph node measuring up to 2.8 x 1.8 cm with central necrosis suspicious for metastatic disease as well as an indeterminate asymmetric prominence of the right level 2A lymph node measuring up to 16 x 10 mm.  He subsequently was referred to ENT and had laryngoscopy as an biopsy which consistent with poorly differentiated invasive squamous cell carcinoma, nonkeratinizing subtype  Patient presented with symptoms of:  He is swallowing with some minimal difficulty, taking meds and normal foods. His left neck swelling subsided but the right neck swelling persisted hence he underwent further investigation.   Biopsies revealed:  06/17/2022  FINAL MICROSCOPIC DIAGNOSIS:   A. 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   Note: Dr. [place] Callas has peer reviewed the case and agrees with the  interpretation.   B. POST CRICOID, BIOPSY:  -  Squamous mucosa with no significant pathology.  -  Negative for dysplasia/malignancy.   INTRAOPERATIVE DIAGNOSIS:   A.  Right pyriform process: "Invasive squamous cell carcinoma"  Intraoperative diagnosis rendered by Dr. Reynolds Bowl at 1:30 PM on 06/17/2022.   08/26/22  Final Diagnosis  A. 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   B. LYMPH NODES, RIGHT NECK, LEVELS 2A, 3, 4; LYMPH NODE DISSECTION:  One (1) of twenty two (22) lymph nodes positive for metastatic carcinoma (1/22). Nodal metastatic deposit measures 4.0 cm.  C. LYMPH NODES, RIGHT NECK LEVEL 2B; LYMPH NODE DISSECTION:  Two lymph nodes, both negative for metastatic carcinoma (0/2).  D. LYMPH NODES, LEFT NECK CONTENTS LEVEL 2A & 3; LYMPH NODE DISSECTION :  Eight lymph nodes, all  negative for metastatic carcinoma (0/8).    Nutrition Status Yes No Comments  Weight changes? [x]  []  Has lost a few pounds but has been trying   Swallowing concerns? []  [x]  No swallowing concerns, voice has been hoarse  PEG? []  [x]     Referrals Yes No Comments  Social Work? []  [x]    Dentistry? [x]  []  Has medical clearance for radiation  Swallowing therapy? [x]  []    Nutrition? [x]  []    Med/Onc? [x]  []     Safety Issues Yes No Comments  Prior radiation? []  [x]    Pacemaker/ICD? []  [x]    Possible current pregnancy? []  [x]    Is the patient on methotrexate? []  [x]     Tobacco/Marijuana/Snuff/ETOH use: never smoked, does not drink  Past/Anticipated interventions by otolaryngology, if any:  06/17/2022  Pre-Op Diagnosis: Pyriform sinus mass; Cervical adenopathy   Post-Op Diagnosis: Pyriform sinus mass; Cervical adenopathy   Procedure: Procedure(s): MICROLARYNGOSCOPY WITH BIOPSY   Anesthesia: General   Surgeon(s): Meghan A Skotnicki, DO    Evern Bio, MD - 09/06/2022  History Mr. Windholz returns for f/u visit.   He first noted a right neck mass in early 2024. Imaging showed a hypo pharynx mass, with a solitary lymph node.   He underwent endoscopic CO2 laser excision and bilateral neck dissection on 08/26/22  He has done well since discharge from the hospital  He is having mild pain.   There have been no problems with the neck wounds. He denies fevers, drainage, bleeding, or other symptoms.   He is swallowing with some minimal difficulty, taking meds and normal foods . No signs of aspiration  Voice is fairly normal   Examination  Vitals - Blood pressure (!) 166/77, pulse 66, weight 87.5 kg (193 lb), SpO2 99%.  General appearance of patient: healthy and no distress  Quality of voice: no hoarseness  Inspection of head and face: Normocephalic, without obvious abnormality, sinuses nontender to percussion  Left parotid gland: soft, nontender, no swelling, no  masses/lesions Right parotid gland: soft, nontender, no swelling, no masses/lesions Facial strength: intact, symmetric  Oral cavity: tongue mobile, FOM soft, mucosa healthy throughout with no masses, lesions, ulcerations  Oropharynx: mucosa of tonsillar fossae healthy; soft palate/uvula intact, mobile, mucosa healthy; tongue base soft, nontender; no mucosal ulcerations or masses or other worrisome lesions Mirror Examination: Attempted but difficult due to gag reflex.  Neck: incisions healing nicely; No palpable neck masses or enlarged lymph nodes  Thyroid: normal size, nontender, no nodules palpable   ?Procedure: Flexible fiberoptic laryngoscopy Technique: After anesthetizing the nasal cavity with topical lidocaine and oxymetazoline, the flexible endoscope was introduced and passed through the nasal cavity into the nasopharynx. The scope was then advanced to the level of the oropharynx. The posterior soft palate, uvula, tongue base and vallecula were visualized and appeared healthy without mucosal masses or lesions. The wound along the right aryepiglottic fold and medial wall of the hypopharynx is granulating appropriately. Vocal fold is mobile. The epiglottis, supraglottis, glottis were visualized and appeared healthy without mucosal masses or lesions. The scope was withdrawn from the nose. He tolerated the procedure well.   Impression   Right arytenoid / hypo pharynx SCCa, HPV+, s/p endoscopic CO2 laser resection and bilateral neck dissection on 08/26/22. Pathology with fragmented main tumor specimen, 1/24 right neck nodes (4 cm, no ENE), 0/8 left neck nodes  He appears to be healing appropriately   Wound care measures were taught to the patient.   He is in a gray area as far as recommendations for adjuvant therapy. Given the size of the node, we typically consider RT for this, but with only one node and no ENE, there would be no need for chemo  He'll think about it and let me and Dr Basilio Cairo  know.   I will see him back in 6 weeks.   Past/Anticipated interventions by medical oncology, if any:  Rachel Moulds, MD 07/08/2022  ASSESSMENT & PLAN:    This is a pleasant 67 year old male patient with well-controlled type 2 diabetes mellitus with new diagnosis of squamous cell carcinoma of the hypopharynx referred to medical oncology for recommendations.  He had a PET/CT without any evidence of distant metastatic disease.  He is currently staged as T2 N2b given more than 1 possible ipsilateral lymphadenopathy consistent with metastatic disease.   We have today discussed about options for treatment and there is locally advanced hypopharyngeal cancer which consist of surgery followed by adjuvant radiation plus or minus chemo if he has extranodal extension or positive margins.  Other option would be to consider concurrent weekly chemotherapy radiation followed by salvage surgery if he has residual disease.   We have discussed about role of cisplatin being a radiosensitizer in the treatment of head and neck cancer.  We have discussed about the curative intent of chemoradiation for this patient.  We have discussed about mechanism of action of cisplatin, adverse effects of cisplatin including but not limited to fatigue, nausea, vomiting, increased risk of infections, mucositis, ototoxicity, nephrotoxicity, peripheral neuropathy.  Patient understands that some of the side effects can be permanent and fatal.  We have discussed about role of  Mediport and G-tube for chemotherapy administration and nutrition respectively since most of these patients have severe mucositis during the treatment.   At this time he will try to talk to Dr. Hezzie Bump again and keep [name] posted if he would like to proceed with concurrent chemoradiation.  He understands the possibility of needing adjuvant chemoradiation after surgery if he has any adverse pathological features.   Thank you for consulting [name] in the care of this  patient.  Please not hesitate to contact us with any additional questions or concerns   HISTORY OF PRESENTING ILLNESS:  Randall [name] 67 y.o. male is here because of SCC hypopharynx.   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  He initially presented with bilateral neck swelling in January 2024 when he had COVID.  His left neck swelling subsided but the right neck swelling persisted hence he underwent further investigation.  He had an ultrasound, CT which demonstrated a soft tissue mass in the right pyriform sinus.  CT also demonstrated a right level 3 lymph node measuring up to 2.8 x 1.8 cm with central necrosis suspicious for metastatic disease as well as an indeterminate asymmetric prominence of the right level 2A lymph node measuring up to 16 x 10 mm.  He subsequently was referred to ENT and had laryngoscopy as an biopsy which consistent with poorly differentiated invasive squamous cell carcinoma, nonkeratinizing subtype.  He followed up with ENT as well as Dr. Basilio Cairo from radiation oncology the recommendations were surgery followed by adjuvant RT plus or minus chemo versus chemo RT followed by salvage surgery for residual disease.  He is here to discuss about the chemotherapy part of it.  He had a PET/CT, no official report available but according to my read there is no evidence of metastatic disease.  He has very well-controlled type 2 diabetes mellitus, last hemoglobin A1c was 5.5%.  Sizes he is very healthy at baseline.  He is retired.  He denies any history of smoking.  Rest of the pertinent 10 point ROS reviewed and negative  Current Complaints / other details:  Wants to know what his margins looked like. Wants to know review of radiation side effects. Pt continues to report 6-7/10 in his bilat neck and chest. He is taking Tylenol for that. Pt reports he is healing  well from surgery he had in August. He is having trouble sleeping since surgery.   Wt Readings from Last 3 Encounters:  09/21/22 195 lb 6 oz (88.6 kg)  07/08/22 200 lb 9.6 oz (91 kg)  07/04/22 200 lb 8 oz (90.9 kg)   Vitals:   09/21/22 1303  BP: (!) 143/70  Pulse: 66  Resp: 18  Temp: (!) 97.5 F (36.4 C)  SpO2: 100%

## 2022-09-21 ENCOUNTER — Ambulatory Visit
Admission: RE | Admit: 2022-09-21 | Discharge: 2022-09-21 | Disposition: A | Discharge status: Home / Self Care | Payer: Medicare HMO | Source: Ambulatory Visit | Attending: Radiation Oncology | Admitting: Radiation Oncology

## 2022-09-21 ENCOUNTER — Encounter: Payer: Self-pay | Admitting: Radiation Oncology

## 2022-09-21 VITALS — BP 143/70 | HR 66 | Temp 97.5°F | Resp 18 | Ht 68.0 in | Wt 195.4 lb

## 2022-09-21 DIAGNOSIS — Z791 Long term (current) use of non-steroidal anti-inflammatories (NSAID): Secondary | ICD-10-CM | POA: Insufficient documentation

## 2022-09-21 DIAGNOSIS — K219 Gastro-esophageal reflux disease without esophagitis: Secondary | ICD-10-CM | POA: Diagnosis not present

## 2022-09-21 DIAGNOSIS — Z87442 Personal history of urinary calculi: Secondary | ICD-10-CM | POA: Insufficient documentation

## 2022-09-21 DIAGNOSIS — I2510 Atherosclerotic heart disease of native coronary artery without angina pectoris: Secondary | ICD-10-CM | POA: Insufficient documentation

## 2022-09-21 DIAGNOSIS — E119 Type 2 diabetes mellitus without complications: Secondary | ICD-10-CM | POA: Insufficient documentation

## 2022-09-21 DIAGNOSIS — Z7984 Long term (current) use of oral hypoglycemic drugs: Secondary | ICD-10-CM | POA: Insufficient documentation

## 2022-09-21 DIAGNOSIS — Z7982 Long term (current) use of aspirin: Secondary | ICD-10-CM | POA: Insufficient documentation

## 2022-09-21 DIAGNOSIS — Z7985 Long-term (current) use of injectable non-insulin antidiabetic drugs: Secondary | ICD-10-CM | POA: Diagnosis not present

## 2022-09-21 DIAGNOSIS — I10 Essential (primary) hypertension: Secondary | ICD-10-CM | POA: Diagnosis not present

## 2022-09-21 DIAGNOSIS — Z8616 Personal history of COVID-19: Secondary | ICD-10-CM | POA: Diagnosis not present

## 2022-09-21 DIAGNOSIS — Z79899 Other long term (current) drug therapy: Secondary | ICD-10-CM | POA: Insufficient documentation

## 2022-09-21 DIAGNOSIS — C12 Malignant neoplasm of pyriform sinus: Secondary | ICD-10-CM | POA: Insufficient documentation

## 2022-09-21 NOTE — Progress Notes (Signed)
Oncology Nurse Navigator Documentation   Mr. Vongunten presented for a follow up new appointment with Dr. Basilio Cairo today after his surgery. After discussion with Dr. Basilio Cairo today he will proceed with post operative radiation therapy. He is aware that I will call him with a radiation planning session appointment and he will start on 10/10/22. He has my direct number to call me if he has any questions or concerns at anytime.   Hedda Slade RN, BSN, OCN Head & Neck Oncology Nurse Navigator [facility] at [facility] Phone # [phone]  Fax # [phone]

## 2022-09-22 ENCOUNTER — Encounter: Payer: Self-pay | Admitting: Radiation Oncology

## 2022-09-22 ENCOUNTER — Other Ambulatory Visit: Payer: Self-pay

## 2022-09-22 DIAGNOSIS — C12 Malignant neoplasm of pyriform sinus: Secondary | ICD-10-CM

## 2022-09-30 ENCOUNTER — Ambulatory Visit
Admission: RE | Admit: 2022-09-30 | Discharge: 2022-09-30 | Disposition: A | Discharge status: Home / Self Care | Payer: Medicare HMO | Source: Ambulatory Visit | Attending: Radiation Oncology | Admitting: Radiation Oncology

## 2022-09-30 DIAGNOSIS — Z510 Encounter for antineoplastic radiation therapy: Secondary | ICD-10-CM | POA: Insufficient documentation

## 2022-09-30 DIAGNOSIS — Z79899 Other long term (current) drug therapy: Secondary | ICD-10-CM | POA: Diagnosis not present

## 2022-09-30 DIAGNOSIS — C12 Malignant neoplasm of pyriform sinus: Secondary | ICD-10-CM | POA: Insufficient documentation

## 2022-09-30 LAB — TSH: TSH: 2.038 u[IU]/mL (ref 0.350–4.500)

## 2022-09-30 NOTE — Progress Notes (Signed)
Oncology Nurse Navigator Documentation   To provide support, encouragement and care continuity, met with Randall [name] and his wife before his CT SIM.  He tolerated procedure without difficulty, denied questions/concerns.   I encouraged him to call me prior to 10/10/22 [place].   Hedda Slade RN, BSN, OCN Head & Neck Oncology Nurse Navigator [facility] at [facility] Phone # [phone]  Fax # [phone]

## 2022-10-03 ENCOUNTER — Other Ambulatory Visit: Payer: Self-pay

## 2022-10-03 DIAGNOSIS — C12 Malignant neoplasm of pyriform sinus: Secondary | ICD-10-CM

## 2022-10-06 ENCOUNTER — Inpatient Hospital Stay: Payer: Medicare HMO | Attending: Radiation Oncology

## 2022-10-06 NOTE — Progress Notes (Signed)
[facility] Clinical Social Work  Clinical Social Work was referred by new patient protocol for assessment of psychosocial needs.  Clinical Social Worker contacted patient by phone to offer support and assess for needs. CSW introduced herself and support services. CSW reviewed SDOh screenings, no current needs.   No follow up needed at this time   Marguerita Merles, [place] Clinical Social Worker [facility]

## 2022-10-07 DIAGNOSIS — Z510 Encounter for antineoplastic radiation therapy: Secondary | ICD-10-CM | POA: Diagnosis not present

## 2022-10-10 ENCOUNTER — Ambulatory Visit
Admission: RE | Admit: 2022-10-10 | Discharge: 2022-10-10 | Disposition: A | Discharge status: Home / Self Care | Payer: Medicare HMO | Source: Ambulatory Visit | Attending: Radiation Oncology | Admitting: Radiation Oncology

## 2022-10-10 ENCOUNTER — Other Ambulatory Visit: Payer: Self-pay

## 2022-10-10 DIAGNOSIS — C12 Malignant neoplasm of pyriform sinus: Secondary | ICD-10-CM

## 2022-10-10 DIAGNOSIS — J387 Other diseases of larynx: Secondary | ICD-10-CM

## 2022-10-10 DIAGNOSIS — Z510 Encounter for antineoplastic radiation therapy: Secondary | ICD-10-CM | POA: Diagnosis not present

## 2022-10-10 LAB — RAD ONC ARIA SESSION SUMMARY
Course Elapsed Days: 0
Plan Fractions Treated to Date: 1
Plan Prescribed Dose Per Fraction: 2 Gy
Plan Total Fractions Prescribed: 30
Plan Total Prescribed Dose: 60 Gy
Reference Point Dosage Given to Date: 2 Gy
Reference Point Session Dosage Given: 2 Gy
Session Number: 1

## 2022-10-10 MED ORDER — SONAFINE EX EMUL
1.0000 | Freq: Two times a day (BID) | CUTANEOUS | Status: DC
  Administered 2022-10-10: 1 via TOPICAL

## 2022-10-10 NOTE — Progress Notes (Signed)
Oncology Nurse Navigator Documentation   To provide support, encouragement and care continuity, met with Mr. Mcentee after his initial RT.  He was accompanied by his wife. I reviewed the 2-step treatment process, answered questions.  Mr. Vieth completed treatment without difficulty, denied questions/concerns. I reviewed the registration/arrival procedure for subsequent treatments. I encouraged them to call me with questions/concerns as treatments proceed.   Hedda Slade RN, BSN, OCN Head & Neck Oncology Nurse Navigator [facility] at [facility] Phone # [phone]  Fax # [phone]

## 2022-10-11 ENCOUNTER — Other Ambulatory Visit: Payer: Self-pay

## 2022-10-11 ENCOUNTER — Ambulatory Visit
Admission: RE | Admit: 2022-10-11 | Discharge: 2022-10-11 | Disposition: A | Discharge status: Home / Self Care | Payer: Medicare HMO | Source: Ambulatory Visit | Attending: Radiation Oncology | Admitting: Radiation Oncology

## 2022-10-11 DIAGNOSIS — C12 Malignant neoplasm of pyriform sinus: Secondary | ICD-10-CM | POA: Diagnosis present

## 2022-10-11 DIAGNOSIS — J387 Other diseases of larynx: Secondary | ICD-10-CM | POA: Insufficient documentation

## 2022-10-11 LAB — RAD ONC ARIA SESSION SUMMARY
Course Elapsed Days: 1
Plan Fractions Treated to Date: 2
Plan Prescribed Dose Per Fraction: 2 Gy
Plan Total Fractions Prescribed: 30
Plan Total Prescribed Dose: 60 Gy
Reference Point Dosage Given to Date: 4 Gy
Reference Point Session Dosage Given: 2 Gy
Session Number: 2

## 2022-10-12 ENCOUNTER — Ambulatory Visit
Admission: RE | Admit: 2022-10-12 | Discharge: 2022-10-12 | Disposition: A | Discharge status: Home / Self Care | Payer: Medicare HMO | Source: Ambulatory Visit | Attending: Radiation Oncology | Admitting: Radiation Oncology

## 2022-10-12 ENCOUNTER — Other Ambulatory Visit: Payer: Self-pay

## 2022-10-12 DIAGNOSIS — J387 Other diseases of larynx: Secondary | ICD-10-CM | POA: Diagnosis not present

## 2022-10-12 LAB — RAD ONC ARIA SESSION SUMMARY
Course Elapsed Days: 2
Plan Fractions Treated to Date: 3
Plan Prescribed Dose Per Fraction: 2 Gy
Plan Total Fractions Prescribed: 30
Plan Total Prescribed Dose: 60 Gy
Reference Point Dosage Given to Date: 6 Gy
Reference Point Session Dosage Given: 2 Gy
Session Number: 3

## 2022-10-13 ENCOUNTER — Encounter: Payer: Self-pay | Admitting: Physical Therapy

## 2022-10-13 ENCOUNTER — Other Ambulatory Visit: Payer: Self-pay

## 2022-10-13 ENCOUNTER — Ambulatory Visit: Payer: Medicare HMO | Attending: Radiation Oncology | Admitting: Physical Therapy

## 2022-10-13 ENCOUNTER — Ambulatory Visit: Payer: Medicare HMO

## 2022-10-13 ENCOUNTER — Ambulatory Visit
Admission: RE | Admit: 2022-10-13 | Discharge: 2022-10-13 | Disposition: A | Discharge status: Home / Self Care | Payer: Medicare HMO | Source: Ambulatory Visit | Attending: Radiation Oncology | Admitting: Radiation Oncology

## 2022-10-13 DIAGNOSIS — C12 Malignant neoplasm of pyriform sinus: Secondary | ICD-10-CM | POA: Diagnosis present

## 2022-10-13 DIAGNOSIS — J387 Other diseases of larynx: Secondary | ICD-10-CM | POA: Diagnosis not present

## 2022-10-13 DIAGNOSIS — R1310 Dysphagia, unspecified: Secondary | ICD-10-CM | POA: Diagnosis not present

## 2022-10-13 DIAGNOSIS — R293 Abnormal posture: Secondary | ICD-10-CM | POA: Diagnosis not present

## 2022-10-13 DIAGNOSIS — R499 Unspecified voice and resonance disorder: Secondary | ICD-10-CM | POA: Insufficient documentation

## 2022-10-13 DIAGNOSIS — M25611 Stiffness of right shoulder, not elsewhere classified: Secondary | ICD-10-CM

## 2022-10-13 LAB — RAD ONC ARIA SESSION SUMMARY
Course Elapsed Days: 3
Plan Fractions Treated to Date: 4
Plan Prescribed Dose Per Fraction: 2 Gy
Plan Total Fractions Prescribed: 30
Plan Total Prescribed Dose: 60 Gy
Reference Point Dosage Given to Date: 8 Gy
Reference Point Session Dosage Given: 2 Gy
Session Number: 4

## 2022-10-13 NOTE — Therapy (Signed)
OUTPATIENT PHYSICAL THERAPY HEAD AND NECK BASELINE EVALUATION   Patient Name: Randall [name] MRN: [id number] DOB:05-10-1955, 67 y.o., male Today's Date: 10/13/2022  END OF SESSION:  PT End of Session - 10/13/22 1907     Visit Number 1    Number of Visits 2    Date for PT Re-Evaluation 12/22/22    PT Start Time 1242    PT Stop Time 1320    PT Time Calculation (min) 38 min    Activity Tolerance Patient tolerated treatment well    Behavior During Therapy [facility] for tasks assessed/performed             Past Medical History:  Diagnosis Date   Complication of anesthesia    Diabetes mellitus without complication (HCC)    Elevated coronary artery calcium score 02/02/2021   GERD (gastroesophageal reflux disease)    History of kidney stones 2014   Hypertension    Kidney stone 06/17/2015   PONV (postoperative nausea and vomiting)    Rupture of right quadriceps tendon 09/21/2020   - s/p srugical repair   Tuberculosis 1980   Type 2 diabetes mellitus without complication, without long-term current use of insulin (HCC) 03/21/2020   Past Surgical History:  Procedure Laterality Date   KIDNEY SURGERY     KNEE SURGERY     LITHOTRIPSY     MICROLARYNGOSCOPY Bilateral 06/17/2022   Procedure: DIRECT LARYNGOSCOPY WITH BIOPSY;  Surgeon: Laren Boom, DO;  Location: [facility] OR;  Service: ENT;  Laterality: Bilateral;   QUADRICEPS TENDON REPAIR Right 09/25/2020   Procedure: REPAIR RIGHT QUADRICEP TENDON;  Surgeon: Nadara Mustard, MD;  Location: [facility] OR;  Service: Orthopedics;  Laterality: Right;   TONSILLECTOMY     Patient Active Problem List   Diagnosis Date Noted   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   Hyperlipidemia LDL goal <70 02/21/2022   Elevated coronary artery calcium score 01/26/2021   Thoracic aortic atherosclerosis (HCC) 01/26/2021   Decreased left ventricular systolic function 12/25/2020   Resistant hypertension 11/13/2020   Right bundle branch  block 11/13/2020   Hypertensive heart disease with chronic diastolic congestive heart failure (HCC) 11/13/2020   Quadriceps tendon rupture, right, sequela     PCP: Benedetto Goad, MD  REFERRING PROVIDER: Lonie Peak, MD  REFERRING DIAG: C12 (ICD-10-CM) - Pyriform sinus cancer (HCC)   THERAPY DIAG:  Abnormal posture  Stiffness of right shoulder, not elsewhere classified  Pyriform sinus cancer [facility])  Rationale for Evaluation and Treatment: Rehabilitation  ONSET DATE: 06/17/22  SUBJECTIVE:     SUBJECTIVE STATEMENT: Patient reports they are here today to be seen by their medical team for newly diagnosed cancer of R pyriform sinus.    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 He presented with bilateral neck swelling in Jan 2024. He was also diagnosed with COVID around that time and he attributed his symptoms to that. The right neck swelling continued and he presented to his PCP in April 2024 for further evaluation. 05/05/22 A soft tissue neck US demonstrated a nonspecific 3.2 x 1.5 x 2.5 cm hypoechoic lesion in the right neck, correlating with the palpable site of concern. 05/25/22 CT neck demonstrated a soft tissue mass centered within the right piriform sinus,  measuring up to 22 x 8 mm, concerning for malignancy. CT also demonstrated a right level 3 lymph node measuring up to 2.8 x 1.8 cm, with central necrosis (correlating with the  hypoechoic mass seen on neck ultrasound) suspicious for metastatic disease, and indeterminate asymmetric prominence of right level 2A lymph node, measuring up to 16 x 10 mm. 06/14/22 He saw Dr. Marene Lenz. Laryngoscopy performed during this visit revealed a mass lesion of the right piriform sinus which appeared to extend in to the arytenoid and partially onto the area epiglottic fold. 06/17/22 He underwent direct laryngoscopy to obtain biopsies. Biopsy of the right pyriform sinus showed findings consistent  with invasive poorly differentiated squamous cell carcinoma (nonkeratinizing). A post cricoid biopsy was also collected and showed no evidence of malignancy. 06/28/22 He met with Dr. Hezzie Bump to discuss treatment options- chemo/radiation vs surgery with possible post op radiation. 07/07/22 PET demonstrated the right piriform sinus primary measuring 1.4 cm with an SUV max of 5.9, and the known level 3 ipsilateral nodal metastasis measuring 2.7 cm with an SUV max of 3.2. PET otherwise showed no evidence of extracervical hypermetabolic metastatic disease, and a nonspecific 2 mm LLL pulmonary nodule. He met with Dr. Al Pimple on 6/28 to discuss the option of chemotherapy but ultimately decided to proceed with surgery. 08/26/22 endoscopic CO2 laser excision of the hypopharyngeal mass and bilateral neck dissection by Dr. Hezzie Bump. Based on final pathology showing no evidence of extra nodal extension in the positive excised lymph node chemotherapy is not recommended only adjuvant radiation. 09/21/22 Follow up with Dr. Basilio Cairo. He will receive post operative radiation only. He will receive 35 fractions of radiation to his Hypopharynx and bilateral neck.  He started on 9/30 and he will complete 11/8.   PATIENT GOALS:   to be educated about the signs and symptoms of lymphedema and learn post op HEP.   PAIN:  Are you having pain? Yes: NPRS scale: 7/10 Pain location: neck and R anterior upper quadrant Pain description: constant hurt, burning Aggravating factors: touching it Relieving factors: not touching it  PRECAUTIONS: Active CA  RED FLAGS: None   WEIGHT BEARING RESTRICTIONS: No  FALLS:  Has patient fallen in last 6 months? No Does the patient have a fear of falling that limits activity? No Is the patient reluctant to leave the house due to a fear of falling?No  LIVING ENVIRONMENT: Patient lives with: wife Lives in: House/apartment Has following equipment at home:  pt reports he has walkers, cane, stair  lift  OCCUPATION: retired  LEISURE: went to the [employer] 3x/wk but since surgery has not returned   PRIOR LEVEL OF FUNCTION: Independent   OBJECTIVE: Note: Objective measures were completed at Evaluation unless otherwise noted.  COGNITION: Overall cognitive status: Within functional limits for tasks assessed                  POSTURE:  Forward head and rounded shoulders posture  30 SEC SIT TO STAND: 14 reps in 30 sec without use of UEs which is  Average for patient's age  SHOULDER AROM:   Impaired  L shoulder ROM is WFL, R shoulder ROM is less than 50% of WNL   CERVICAL AROM:   Percent limited  Flexion WFL  Extension 25% limited  Right lateral flexion 50% limited  Left lateral flexion WFL  Right rotation 25% limited  Left rotation 25% limited    (Blank rows=not tested)  GAIT: Assessed: Yes Assistance needed: Independent Ambulation Distance: 10 feet Assistive Device: none Gait pattern: WFL Ambulation surface: Level  TREATMENT: 10/13/22: Instructed pt in sidelying AROM exercises for R shoulder in to flexion, abduction and ER and issued these as part of his HEP  PATIENT EDUCATION:  Education details: Neck ROM, importance of posture when sitting, standing and lying down, deep breathing, walking program and importance of staying active throughout treatment, CURE article on staying active, "Why exercise?" flyer, lymphedema and PT info Person educated: Patient Education method: Explanation, Demonstration, Handout Education comprehension: Patient verbalized understanding and returned demonstration  HOME EXERCISE PROGRAM: Patient was instructed today in a home exercise program today for head and neck range of motion exercises. These included active cervical flexion, active cervical extension, active cervical rotation to each direction, upper trap stretch, and shoulder retraction. Patient was encouraged to do these 2-3 times a day, holding for 5 sec each and completing for 5  reps. Pt was educated that once this becomes easier then hold the stretches for 30-60 seconds.    Access Code: [email or web address] URL: [email or web address] Date: 10/13/2022 Prepared by: Leonette Most  Exercises - Sidelying Shoulder Abduction Palm Forward  - 1 x daily - 7 x weekly - 1 sets - 10 reps - Sidelying Shoulder External Rotation (Mirrored)  - 1 x daily - 7 x weekly - 1 sets - 10 reps - Sidelying Shoulder Flexion 15 Degrees (Mirrored)  - 1 x daily - 7 x weekly - 1 sets - 10 reps  ASSESSMENT:  CLINICAL IMPRESSION: Pt arrives to PT with recently diagnosed pyriform sinus cancer. He will receive 35 fractions of radiation to his Hypopharynx and bilateral neck.  He started on 9/30 and he will complete 11/8. Pt's cervical ROM was limited at baseline secondary to his bilateral neck dissection. Educated pt about signs and symptoms of lymphedema as well as anatomy and physiology of lymphatic system. Educated pt in importance of staying as active as possible throughout treatment to decrease fatigue as well as head and neck ROM exercises to decrease loss of ROM. Will see pt after completion of radiation to reassess ROM and assess for lymphedema and to determine therapy needs at that time.  Pt will benefit from skilled therapeutic intervention to improve on the following deficits: Decreased knowledge of precautions and postural dysfunction. Other deficits: decreased ROM and decreased strength  PT treatment/interventions: ADL/self-care home management, pt/family education, therapeutic exercise.   REHAB POTENTIAL: Good  CLINICAL DECISION MAKING: Stable/uncomplicated  EVALUATION COMPLEXITY: Low   GOALS: Goals reviewed with patient? YES  LONG TERM GOALS: (STG=LTG)   Name Target Date  Goal status  1 Patient will be able to verbalize understanding of a home exercise program for cervical range of motion, posture, and walking.   Baseline:  No knowledge 10/13/2022 Achieved at eval   2 Patient will be able to verbalize understanding of proper sitting and standing posture. Baseline:  No knowledge 10/13/2022 Achieved at eval  3 Patient will be able to verbalize understanding of lymphedema risk and availability of treatment for this condition Baseline:  No knowledge 10/13/2022 Achieved at eval  4 Pt will demonstrate a return to full cervical ROM and function post operatively compared to baselines and not demonstrate any signs or symptoms of lymphedema.  Baseline: See objective measurements taken today. 12/22/22 New    PLAN:  PT FREQUENCY/DURATION: EVAL and 1 follow up appointment.   PLAN FOR NEXT SESSION: will reassess 2 weeks after completion of radiation to determine needs.  Patient will follow up at outpatient cancer rehab 2 weeks after completion of radiation.  If the patient requires physical therapy at that time, a specific plan will be dictated and sent to the referring physician for approval. The patient was educated today on appropriate  basic range of motion exercises to begin now and continue throughout radiation and educated on the signs and symptoms of lymphedema. Patient verbalized good understanding.     Physical Therapy Information for During and After Head/Neck Cancer Treatment: Lymphedema is a swelling condition that you may be at risk for in your neck and/or face if you have radiation treatment to the area and/or if you have surgery that includes removing lymph nodes.  There is treatment available for this condition and it is not life-threatening.  Contact your physician or physical therapist with concerns. An excellent resource for those seeking information on lymphedema is the National Lymphedema Network's website.  It can be accessed at www.lymphnet.org If you notice swelling in your neck or face at any time following surgery (even if it is many years from now), please contact your doctor or physical therapist to discuss this.  Lymphedema can be treated at  any time but it is easier for you if it is treated early on. If you have had surgery to your neck, please check with your surgeon about how soon to start doing neck range of motion exercises.  If you are not having surgery, I encourage you to start doing neck range of motion exercises today and continue these while undergoing treatment, UNLESS you have irritation of your skin or soft tissue that is aggravated by doing them.  These exercises are intended to help you prevent loss of range of motion and/or to gain range of motion in your neck (which can be limited by tightening effects of radiation), and NOT to aggravate these tissues if they develop sensitivities from treatment. Neck range of motion exercises should be done to the point of feeling a GENTLE, TOLERABLE stretch only.  You are encouraged to start a walking or other exercise program tomorrow and continue this as much as you are able through and after treatment.  Please feel free to call me with any questions. Leonette Most, PT, CLT Physical Therapist and Certified Lymphedema Therapist [facility] [address], [address], [place], [place] [address] [phone] [email or web address]  WALKING  Walking is a great form of exercise to increase your strength, endurance and overall fitness.  A walking program can help you start slowly and gradually build endurance as you go.  Everyone's ability is different, so each person's starting point will be different.  You do not have to follow them exactly.  The are just samples. You should simply find out what's right for you and stick to that program.   In the beginning, you'll start off walking 2-3 times a day for short distances.  As you get stronger, you'll be walking further at just 1-2 times per day.  A. You Can Walk For A Certain Length Of Time Each Day    Walk 5 minutes 3 times per day.  Increase 2 minutes every 2 days (3 times per day).  Work up to  25-30 minutes (1-2 times per day).   Example:   Day 1-2 5 minutes 3 times per day   Day 7-8 12 minutes 2-3 times per day   Day 13-14 25 minutes 1-2 times per day  B. You Can Walk For a Certain Distance Each Day     Distance can be substituted for time.    Example:   3 trips to mailbox (at road)   3 trips to corner of block   3 trips around the block  C. Go to local high school and use  the track.    Walk for distance ____ around track  Or time ____ minutes  D. Walk ____ Jog ____ Run ___   Why exercise?  So many benefits! Here are SOME of them: Heart health, including raising your good cholesterol level and reducing heart rate and blood pressure Lung health, including improved lung capacity It burns fats, and most of [name] can stand to be leaner, whether or not we are overweight. It increases the body's natural painkillers and mood elevators, so makes you feel better. Not only makes you feel better, but look better too Improves sleep Takes a bite out of stress May decrease your risk of many types of cancer If you are currently undergoing cancer treatment, exercise may improve your ability to tolerate treatments including chemotherapy. For everybody, it can improve your energy level. Those with cancer-related fatigue report a 40-50% reduction in this symptom when exercising regularly. If you are a survivor of breast, colon, or prostate cancer, it may decrease your risk of a recurrence. (This may hold for other cancers too, but so far we have data just for these three types.)  How to exercise: Get your doctor's okay. Pick something you enjoy doing, like walking, Zumba, biking, swimming, or whatever. Start at low intensity and time, then gradually increase.  (See walking program handout.) Set a goal to achieve over time.  The American Cancer Society, American Heart Association, and U.S. Dept. of Health and Human Services recommend 150 minutes of moderate exercise, 75 minutes of  vigorous exercise, or a combination of both per week. This should be done in episodes at least 10 minutes long, spread throughout the week.  Need help being motivated? Pick something you enjoy doing, because you'll be more inclined to stick with that activity than something that feels like a chore. Do it with a friend so that you are accountable to each other. Schedule it into your day. Place it on your calendar and keep that appointment just like you do any appointment that you make. Join an exercise group that meets at a specific time.  That way, you have to show up on time, and that makes it harder to procrastinate about doing your workout.  It also keeps you accountable--people begin to expect you to be there. Join a gym where you feel comfortable and not intimidated, at the right cost. Sign up for something that you'll need to be in shape for on a specific date, like a 1K or a 5K to walk or run, a 20 or 30 mile bike ride, a mud run or something like that. If the date is looming, you know you'll need to train to be ready for it.  An added benefit is that many of these are fundraisers for good causes. If you've already paid for a gym membership, group exercise class or event, you might as well work out, so you haven't wasted your money!    [facility] [place], PT 10/13/2022, 7:08 PM

## 2022-10-13 NOTE — Therapy (Signed)
OUTPATIENT SPEECH LANGUAGE PATHOLOGY ONCOLOGY EVALUATION   Patient Name: Randall [name] MRN: [id number] DOB:1955-03-10, 67 y.o., male Today's Date: 10/13/2022  PCP: Benedetto Goad, MD REFERRING PROVIDER: Lonie Peak, MD  END OF SESSION:  End of Session - 10/13/22 1410     Visit Number 1    Number of Visits 7    Date for SLP Re-Evaluation 01/11/23    SLP Start Time 1205    SLP Stop Time  1245    SLP Time Calculation (min) 40 min    Activity Tolerance Patient tolerated treatment well             Past Medical History:  Diagnosis Date   Complication of anesthesia    Diabetes mellitus without complication (HCC)    Elevated coronary artery calcium score 02/02/2021   GERD (gastroesophageal reflux disease)    History of kidney stones 2014   Hypertension    Kidney stone 06/17/2015   PONV (postoperative nausea and vomiting)    Rupture of right quadriceps tendon 09/21/2020   - s/p srugical repair   Tuberculosis 1980   Type 2 diabetes mellitus without complication, without long-term current use of insulin (HCC) 03/21/2020   Past Surgical History:  Procedure Laterality Date   KIDNEY SURGERY     KNEE SURGERY     LITHOTRIPSY     MICROLARYNGOSCOPY Bilateral 06/17/2022   Procedure: DIRECT LARYNGOSCOPY WITH BIOPSY;  Surgeon: Laren Boom, DO;  Location: [facility] OR;  Service: ENT;  Laterality: Bilateral;   QUADRICEPS TENDON REPAIR Right 09/25/2020   Procedure: REPAIR RIGHT QUADRICEP TENDON;  Surgeon: Nadara Mustard, MD;  Location: [facility] OR;  Service: Orthopedics;  Laterality: Right;   TONSILLECTOMY     Patient Active Problem List   Diagnosis Date Noted   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   Hyperlipidemia LDL goal <70 02/21/2022   Elevated coronary artery calcium score 01/26/2021   Thoracic aortic atherosclerosis (HCC) 01/26/2021   Decreased left ventricular systolic function 12/25/2020   Resistant hypertension 11/13/2020   Right bundle branch  block 11/13/2020   Hypertensive heart disease with chronic diastolic congestive heart failure (HCC) 11/13/2020   Quadriceps tendon rupture, right, sequela     ONSET DATE: January 2024   REFERRING DIAG: Pyriform Sinus Cancer  THERAPY DIAG:  Dysphagia, unspecified type  Rationale for Evaluation and Treatment: Rehabilitation  SUBJECTIVE:   SUBJECTIVE STATEMENT: Pt denies any overt s/sx oral or pharyngeal dysphagia Pt accompanied by: significant other  PERTINENT HISTORY:  :    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 Hx: He presented with bilateral neck swelling in Jan 2024. He was also diagnosed with COVID around that time and he attributed his symptoms to that. The right neck swelling continued and he presented to his PCP in April 2024 for further evaluation. 05/05/22 A soft tissue neck US demonstrated a nonspecific 3.2 x 1.5 x 2.5 cm hypoechoic lesion in the right neck, correlating with the palpable site of concern. 05/25/22 CT neck demonstrated a soft tissue mass centered within the right piriform sinus,  measuring up to 22 x 8 mm, concerning for malignancy. CT also demonstrated a right level 3 lymph node measuring up to 2.8 x 1.8 cm, with central necrosis (correlating with the hypoechoic mass seen on neck ultrasound) suspicious for metastatic disease, and indeterminate asymmetric prominence of right level 2A lymph node, measuring up to 16 x 10 mm   06/14/22 He saw Dr. Marene Lenz.  Laryngoscopy performed during this visit revealed a mass lesion of the right piriform sinus which appeared to extend in to the arytenoid and partially onto the area epiglottic fold. 06/17/22 He underwent direct laryngoscopy to obtain biopsies. Biopsy of the right pyriform sinus showed findings consistent with invasive poorly differentiated squamous cell carcinoma (nonkeratinizing). A post cricoid biopsy was also collected and showed no evidence of malignancy. 06/28/22 He met with  Dr. Hezzie Bump to discuss treatment options- chemo/radiation vs surgery with possible post op radiation.07/04/22 Consult with Dr. Basilio Cairo. 07/07/22 PET demonstrated the right piriform sinus primary measuring 1.4 cm with an SUV max of 5.9, and the known level 3 ipsilateral nodal metastasis measuring 2.7 cm with an SUV max of 3.2. PET otherwise showed no evidence of extracervical hypermetabolic metastatic disease, and a nonspecific 2 mm LLL pulmonary nodule. He met with Dr. Al Pimple on 6/28 to discuss the option of chemotherapy but ultimately decided to proceed with surgery. 08/26/22 endoscopic CO2 laser excision of the hypopharyngeal mass and bilateral neck dissection by Dr. Hezzie Bump. Based on final pathology showing no evidence of extra nodal extension in the positive excised lymph node chemotherapy is not recommended only adjuvant radiation. 09/21/22 Follow up with Dr. Basilio Cairo. He will receive post operative radiation only. Treatment plan:  He will receive 35 fractions of radiation to his Hypopharynx and bilateral neck.  He started on 9/30 and he will complete 11/8  PAIN:  Are you having pain? Yes: NPRS scale: 7/10 Pain location: neck and R anterior upper quadrant Pain description: constant hurt, burning Aggravating factors: touching it Relieving factors: not touching it  FALLS: Has patient fallen in last 6 months?  No  LIVING ENVIRONMENT: Lives with: lives with their spouse Lives in: House/apartment  PLOF:  Level of assistance: Independent with ADLs, Independent with IADLs Employment: Retired  PATIENT GOALS: "Do everything I can"  OBJECTIVE:  Note: Objective measures were completed at Evaluation unless otherwise noted.   COGNITION: Overall cognitive status: Within functional limits for tasks assessed  LANGUAGE: Receptive and Expressive language appeared WNL.  ORAL MOTOR EXAMINATION: Overall status: WFL  MOTOR SPEECH: Overall motor speech: Appears intact  CLINICAL SWALLOW ASSESSMENT:    Current diet: regular Dentition: adequate natural dentition Patient directly observed with POs: Yes: dysphagia 3 (soft) and thin liquids  Feeding: able to feed self Liquids provided by: cup Oral phase signs and symptoms:  none noted Pharyngeal phase signs and symptoms:  none noted   TODAY'S TREATMENT:                                                                                                                                         DATE:   10/13/22 (eval): Research states the risk for dysphagia increases due to radiation and/or chemotherapy treatment due to a variety of factors, so SLP educated the pt about the possibility of reduced/limited ability for PO intake during rad tx. SLP  also educated pt regarding possible changes to swallowing musculature after rad tx, and why adherence to dysphagia HEP provided today and PO consumption was necessary to inhibit muscle fibrosis following rad tx and to mitigate muscle disuse atrophy. SLP informed pt why this would be detrimental to their swallowing status and to their pulmonary health. Pt demonstrated understanding of these things to SLP. SLP encouraged pt to safely eat and drink as deep into their radiation/chemotherapy as possible to provide the best possible long-term swallowing outcome for pt.  SLP then developed an individualized HEP for pt involving oral and pharyngeal strengthening and ROM and pt was instructed how to perform these exercises, including SLP demonstration. After SLP demonstration, pt return demonstrated each exercise. SLP ensured pt performance was correct prior to educating pt on next exercise. Pt required min cues faded to modified independent to perform HEP. Pt was instructed to complete this program 6-7 days/week, at least 2 times a day until 6 months after his or her last day of rad tx, and then x2 a week after that, indefinitely. Among other modifications for days when pt cannot functionally swallow, SLP also suggested pt to  perform only non-swallowing tasks on the handout/HEP, and if necessary to cycle through the swallowing portion so the full program of exercises can be completed instead of fatiguing on one of the swallowing exercises and being unable to perform the other swallowing exercises. SLP instructed that swallowing exercises should then be added back into the regimen as pt is able to do so.   PATIENT EDUCATION: Education details: late effects head/neck radiation on swallow function, HEP procedure, and modification to HEP when difficulty experienced with swallowing during and after radiation course Person educated: Patient and Spouse Education method: Explanation, Demonstration, Verbal cues, and Handouts Education comprehension: verbalized understanding, returned demonstration, verbal cues required, and needs further education  ASSESSMENT:  CLINICAL IMPRESSION: Patient is a 67 y.o. M who was seen today for assessment of swallowing as they undergo radiation/chemoradiation therapy. Today pt ate  [place] sandwich and drank thin liquids without overt s/s oral or pharyngeal difficulty. At this time pt swallowing is deemed WNL/WFL with these POs. No oral or overt s/sx pharyngeal deficits, including aspiration were observed. There are no overt s/s aspiration PNA observed by SLP nor any reported by pt at this time. Data indicate that pt's swallow ability will likely decrease over the course of radiation/chemoradiation therapy and could very well decline over time following the conclusion of that therapy due to muscle disuse atrophy and/or muscle fibrosis. Pt will cont to need to be seen by SLP in order to assess safety of PO intake, assess the need for recommending any objective swallow assessment, and ensuring pt is correctly completing the individualized HEP.   OBJECTIVE IMPAIRMENTS: include voice disorder and dysphagia. These impairments are limiting patient from effectively communicating at home and in community and  safety when swallowing. Factors affecting potential to achieve goals and functional outcome are  none noted . Patient will benefit from skilled SLP services to address above impairments and improve overall function.   REHAB POTENTIAL: Good   GOALS: Goals reviewed with patient? No   SHORT TERM GOALS: Target: 3rd total session   1. Pt will compelte HEP with modified independence in 2 sessions Baseline: Goal status: INITIAL   2.  pt will tell SLP why pt is completing HEP with modified independence Baseline:  Goal status: INITIAL   3.  pt will describe 3 overt s/s aspiration PNA with modified  independence Baseline:  Goal status: INITIAL   4.  pt will tell SLP how a food journal could hasten return to a more normalized diet Baseline:  Goal status: INITIAL     LONG TERM GOALS: Target: 7th total session   1.  pt will complete HEP with independence over two visits Baseline:  Goal status: INITIAL   2.  pt will describe how to modify HEP over time, and the timeline associated with reduction in HEP frequency with modified independence over two sessions Baseline:  Goal status: INITIAL     PLAN:   SLP FREQUENCY:  once approx every 4 weeks   SLP DURATION:  7 sessions   PLANNED INTERVENTIONS: Aspiration precaution training, Pharyngeal strengthening exercises, Diet toleration management , Trials of upgraded texture/liquids, SLP instruction and feedback, Compensatory strategies, and Patient/family education    [facility], CCC-SLP 10/13/2022, 2:11 PM

## 2022-10-14 ENCOUNTER — Other Ambulatory Visit: Payer: Self-pay

## 2022-10-14 ENCOUNTER — Ambulatory Visit
Admission: RE | Admit: 2022-10-14 | Discharge: 2022-10-14 | Disposition: A | Discharge status: Home / Self Care | Payer: Medicare HMO | Source: Ambulatory Visit | Attending: Radiation Oncology | Admitting: Radiation Oncology

## 2022-10-14 ENCOUNTER — Inpatient Hospital Stay: Payer: Medicare HMO | Attending: Radiation Oncology | Admitting: Dietician

## 2022-10-14 DIAGNOSIS — J387 Other diseases of larynx: Secondary | ICD-10-CM | POA: Diagnosis not present

## 2022-10-14 LAB — RAD ONC ARIA SESSION SUMMARY
Course Elapsed Days: 4
Plan Fractions Treated to Date: 5
Plan Prescribed Dose Per Fraction: 2 Gy
Plan Total Fractions Prescribed: 30
Plan Total Prescribed Dose: 60 Gy
Reference Point Dosage Given to Date: 10 Gy
Reference Point Session Dosage Given: 2 Gy
Session Number: 5

## 2022-10-14 NOTE — Progress Notes (Signed)
Nutrition Assessment   Reason for Assessment: HNC   ASSESSMENT: 67 year old male with SCC of pyriform sinus, p16 positive. S/p CO2 laser excision/bilateral neck dissection on 8/16. He is receiving adjuvant radiation. Patient is under the care of Dr. Basilio Cairo  Past medical history includes DM2, GERD, HLD, TB (1980)  Met with pt and wife in office. Patient reports tolerating treatment well so far. He denies nutrition impact symptoms at this time. Patient is tolerating a regular diet. Pt reports decreased interest in foods s/p Covid. He is eating, but nothing really appeals to him. Patient recalls eggs/breakfast burrito/egg mcmuffin for breakfast. He usually eats an apple for snack. He may have peanut butter crackers later in the day. Recently, patient eating banana/mayo/peanut butter sandwich for dinner. He is drinking 60 oz of water as well as unsweetened tea and 2 cups of coffee.   Nutrition Focused Physical Exam: deferred   Medications: lipitor, coreg, mobic, metformin, protonix, cialis, trulicity 1.5mg  (2022)   Labs: 8/16 - HgbA1c 5.8   Anthropometrics:   Height: 5'8" Weight: 204.4 lb (9/30 [facility]) UBW: 206 lb (Feb) BMI: 31.1   Estimated Energy Needs  Kcals: 2400-2700 Protein: 120-139 Fluid: >/= 2.4 L   NUTRITION DIAGNOSIS: Inadequate oral intake related to [facility] undergoing adjuvant radiation as evidenced by dietary recall insufficient to meet increased calorie/protein energy needs    INTERVENTION:  Educated on importance of adequate calorie/protein energy intake to maintain strength/weight during treatment Encouraged high calorie high protein snacks in between meals - snack ideas + soft moist high protein foods Suggested high calorie protein shake (~350 kcal, 30g) to aid with meeting nutrition goals - samples provided  Discussed likely side effects of RT (dry mouth, sore throat, painful swallow, thick saliva) and importance of weekly nutrition follow-up Encouraged baking soda  salt water rinses several times daily - recipe provided  Contact information given    MONITORING, EVALUATION, GOAL: wt trends, intake   Next Visit: Monday October 14 via telephone with Britta Mccreedy

## 2022-10-14 NOTE — Progress Notes (Signed)
Oncology Nurse Navigator Documentation   I met with Randall [name] and his wife during head and neck MDC yesterday. He is tolerating treatment well at this time. He knows to call me if I can help in anyway during treatment.   Hedda Slade RN, BSN, OCN Head & Neck Oncology Nurse Navigator [facility] Phone # [phone]  Fax # [phone]

## 2022-10-17 ENCOUNTER — Ambulatory Visit
Admission: RE | Admit: 2022-10-17 | Discharge: 2022-10-17 | Disposition: A | Discharge status: Home / Self Care | Payer: Medicare HMO | Source: Ambulatory Visit | Attending: Radiation Oncology | Admitting: Radiation Oncology

## 2022-10-17 ENCOUNTER — Other Ambulatory Visit: Payer: Self-pay

## 2022-10-17 ENCOUNTER — Inpatient Hospital Stay: Payer: Medicare HMO | Admitting: Nutrition

## 2022-10-17 DIAGNOSIS — C12 Malignant neoplasm of pyriform sinus: Secondary | ICD-10-CM

## 2022-10-17 DIAGNOSIS — J387 Other diseases of larynx: Secondary | ICD-10-CM | POA: Diagnosis not present

## 2022-10-17 LAB — RAD ONC ARIA SESSION SUMMARY
Course Elapsed Days: 7
Plan Fractions Treated to Date: 6
Plan Prescribed Dose Per Fraction: 2 Gy
Plan Total Fractions Prescribed: 30
Plan Total Prescribed Dose: 60 Gy
Reference Point Dosage Given to Date: 12 Gy
Reference Point Session Dosage Given: 2 Gy
Session Number: 6

## 2022-10-17 MED ORDER — SONAFINE EX EMUL
1.0000 | Freq: Once | CUTANEOUS | Status: AC
  Administered 2022-10-17: 1 via TOPICAL

## 2022-10-18 ENCOUNTER — Ambulatory Visit
Admission: RE | Admit: 2022-10-18 | Discharge: 2022-10-18 | Disposition: A | Discharge status: Home / Self Care | Payer: Medicare HMO | Source: Ambulatory Visit | Attending: Radiation Oncology | Admitting: Radiation Oncology

## 2022-10-18 ENCOUNTER — Other Ambulatory Visit: Payer: Self-pay

## 2022-10-18 DIAGNOSIS — J387 Other diseases of larynx: Secondary | ICD-10-CM | POA: Diagnosis not present

## 2022-10-18 LAB — RAD ONC ARIA SESSION SUMMARY
Course Elapsed Days: 8
Plan Fractions Treated to Date: 7
Plan Prescribed Dose Per Fraction: 2 Gy
Plan Total Fractions Prescribed: 30
Plan Total Prescribed Dose: 60 Gy
Reference Point Dosage Given to Date: 14 Gy
Reference Point Session Dosage Given: 2 Gy
Session Number: 7

## 2022-10-19 ENCOUNTER — Other Ambulatory Visit: Payer: Self-pay

## 2022-10-19 ENCOUNTER — Ambulatory Visit
Admission: RE | Admit: 2022-10-19 | Discharge: 2022-10-19 | Disposition: A | Discharge status: Home / Self Care | Payer: Medicare HMO | Source: Ambulatory Visit | Attending: Radiation Oncology | Admitting: Radiation Oncology

## 2022-10-19 DIAGNOSIS — J387 Other diseases of larynx: Secondary | ICD-10-CM | POA: Diagnosis not present

## 2022-10-19 LAB — RAD ONC ARIA SESSION SUMMARY
Course Elapsed Days: 9
Plan Fractions Treated to Date: 8
Plan Prescribed Dose Per Fraction: 2 Gy
Plan Total Fractions Prescribed: 30
Plan Total Prescribed Dose: 60 Gy
Reference Point Dosage Given to Date: 16 Gy
Reference Point Session Dosage Given: 2 Gy
Session Number: 8

## 2022-10-20 ENCOUNTER — Ambulatory Visit
Admission: RE | Admit: 2022-10-20 | Discharge: 2022-10-20 | Disposition: A | Discharge status: Home / Self Care | Payer: Medicare HMO | Source: Ambulatory Visit | Attending: Radiation Oncology | Admitting: Radiation Oncology

## 2022-10-20 ENCOUNTER — Other Ambulatory Visit: Payer: Self-pay

## 2022-10-20 DIAGNOSIS — J387 Other diseases of larynx: Secondary | ICD-10-CM | POA: Diagnosis not present

## 2022-10-20 LAB — RAD ONC ARIA SESSION SUMMARY
Course Elapsed Days: 10
Plan Fractions Treated to Date: 9
Plan Prescribed Dose Per Fraction: 2 Gy
Plan Total Fractions Prescribed: 30
Plan Total Prescribed Dose: 60 Gy
Reference Point Dosage Given to Date: 18 Gy
Reference Point Session Dosage Given: 2 Gy
Session Number: 9

## 2022-10-21 ENCOUNTER — Other Ambulatory Visit: Payer: Self-pay

## 2022-10-21 ENCOUNTER — Ambulatory Visit
Admission: RE | Admit: 2022-10-21 | Discharge: 2022-10-21 | Disposition: A | Discharge status: Home / Self Care | Payer: Medicare HMO | Source: Ambulatory Visit | Attending: Radiation Oncology | Admitting: Radiation Oncology

## 2022-10-21 DIAGNOSIS — J387 Other diseases of larynx: Secondary | ICD-10-CM | POA: Diagnosis not present

## 2022-10-21 LAB — RAD ONC ARIA SESSION SUMMARY
Course Elapsed Days: 11
Plan Fractions Treated to Date: 10
Plan Prescribed Dose Per Fraction: 2 Gy
Plan Total Fractions Prescribed: 30
Plan Total Prescribed Dose: 60 Gy
Reference Point Dosage Given to Date: 20 Gy
Reference Point Session Dosage Given: 2 Gy
Session Number: 10

## 2022-10-24 ENCOUNTER — Ambulatory Visit
Admission: RE | Admit: 2022-10-24 | Discharge: 2022-10-24 | Disposition: A | Discharge status: Home / Self Care | Payer: Medicare HMO | Source: Ambulatory Visit | Attending: Radiation Oncology | Admitting: Radiation Oncology

## 2022-10-24 ENCOUNTER — Other Ambulatory Visit: Payer: Self-pay

## 2022-10-24 ENCOUNTER — Inpatient Hospital Stay: Payer: Medicare HMO | Admitting: Nutrition

## 2022-10-24 DIAGNOSIS — J387 Other diseases of larynx: Secondary | ICD-10-CM | POA: Diagnosis not present

## 2022-10-24 LAB — RAD ONC ARIA SESSION SUMMARY
Course Elapsed Days: 14
Plan Fractions Treated to Date: 11
Plan Prescribed Dose Per Fraction: 2 Gy
Plan Total Fractions Prescribed: 30
Plan Total Prescribed Dose: 60 Gy
Reference Point Dosage Given to Date: 22 Gy
Reference Point Session Dosage Given: 2 Gy
Session Number: 11

## 2022-10-24 NOTE — Progress Notes (Signed)
Telephone nutrition follow-up completed with patient.  Received he is receiving radiation therapy for SCC of piriform sinus, p16 positive.  He is status post CO2 laser excision/bilateral neck dissection on August 16.  He is followed by Dr. Basilio Cairo.  Patient has completed 11 out of 30 radiation treatments  Weight documented is 207 pounds today, increased from 205.4 pounds October 7.  Patient reports he is doing well.  He has not been having any difficulty eating.  He tried Glucerna and said it was okay and feels as if he could drink it.  He has been pushing fluids.  He denies nausea, vomiting, constipation, and diarrhea.  Nutrition diagnosis: Inadequate oral intake is stable.  Intervention: Encourage patient to continue high-calorie, high-protein foods and small frequent meals and snacks to minimize weight loss. Drink Glucerna supplements as needed.  Monitoring, evaluation, goals: Patient will tolerate adequate calories and protein to minimize weight loss.  Next visit: Monday, October 21 with Joli.  **Disclaimer: This note was dictated with voice recognition software. Similar sounding words can inadvertently be transcribed and this note may contain transcription errors which may not have been corrected upon publication of note.**

## 2022-10-25 ENCOUNTER — Ambulatory Visit
Admission: RE | Admit: 2022-10-25 | Discharge: 2022-10-25 | Disposition: A | Discharge status: Home / Self Care | Payer: Medicare HMO | Source: Ambulatory Visit | Attending: Radiation Oncology | Admitting: Radiation Oncology

## 2022-10-25 ENCOUNTER — Other Ambulatory Visit: Payer: Self-pay

## 2022-10-25 DIAGNOSIS — J387 Other diseases of larynx: Secondary | ICD-10-CM | POA: Diagnosis not present

## 2022-10-25 LAB — RAD ONC ARIA SESSION SUMMARY
Course Elapsed Days: 15
Plan Fractions Treated to Date: 12
Plan Prescribed Dose Per Fraction: 2 Gy
Plan Total Fractions Prescribed: 30
Plan Total Prescribed Dose: 60 Gy
Reference Point Dosage Given to Date: 24 Gy
Reference Point Session Dosage Given: 2 Gy
Session Number: 12

## 2022-10-26 ENCOUNTER — Other Ambulatory Visit: Payer: Self-pay

## 2022-10-26 ENCOUNTER — Ambulatory Visit
Admission: RE | Admit: 2022-10-26 | Discharge: 2022-10-26 | Disposition: A | Discharge status: Home / Self Care | Payer: Medicare HMO | Source: Ambulatory Visit | Attending: Radiation Oncology | Admitting: Radiation Oncology

## 2022-10-26 DIAGNOSIS — J387 Other diseases of larynx: Secondary | ICD-10-CM | POA: Diagnosis not present

## 2022-10-26 LAB — RAD ONC ARIA SESSION SUMMARY
Course Elapsed Days: 16
Plan Fractions Treated to Date: 13
Plan Prescribed Dose Per Fraction: 2 Gy
Plan Total Fractions Prescribed: 30
Plan Total Prescribed Dose: 60 Gy
Reference Point Dosage Given to Date: 26 Gy
Reference Point Session Dosage Given: 2 Gy
Session Number: 13

## 2022-10-27 ENCOUNTER — Other Ambulatory Visit: Payer: Self-pay

## 2022-10-27 ENCOUNTER — Ambulatory Visit
Admission: RE | Admit: 2022-10-27 | Discharge: 2022-10-27 | Disposition: A | Discharge status: Home / Self Care | Payer: Medicare HMO | Source: Ambulatory Visit | Attending: Radiation Oncology | Admitting: Radiation Oncology

## 2022-10-27 DIAGNOSIS — J387 Other diseases of larynx: Secondary | ICD-10-CM | POA: Diagnosis not present

## 2022-10-27 LAB — RAD ONC ARIA SESSION SUMMARY
Course Elapsed Days: 17
Plan Fractions Treated to Date: 14
Plan Prescribed Dose Per Fraction: 2 Gy
Plan Total Fractions Prescribed: 30
Plan Total Prescribed Dose: 60 Gy
Reference Point Dosage Given to Date: 28 Gy
Reference Point Session Dosage Given: 2 Gy
Session Number: 14

## 2022-10-28 ENCOUNTER — Other Ambulatory Visit: Payer: Self-pay

## 2022-10-28 ENCOUNTER — Ambulatory Visit
Admission: RE | Admit: 2022-10-28 | Discharge: 2022-10-28 | Disposition: A | Discharge status: Home / Self Care | Payer: Medicare HMO | Source: Ambulatory Visit | Attending: Radiation Oncology | Admitting: Radiation Oncology

## 2022-10-28 DIAGNOSIS — J387 Other diseases of larynx: Secondary | ICD-10-CM | POA: Diagnosis not present

## 2022-10-28 LAB — RAD ONC ARIA SESSION SUMMARY
Course Elapsed Days: 18
Plan Fractions Treated to Date: 15
Plan Prescribed Dose Per Fraction: 2 Gy
Plan Total Fractions Prescribed: 30
Plan Total Prescribed Dose: 60 Gy
Reference Point Dosage Given to Date: 30 Gy
Reference Point Session Dosage Given: 2 Gy
Session Number: 15

## 2022-10-31 ENCOUNTER — Inpatient Hospital Stay: Payer: Medicare HMO

## 2022-10-31 ENCOUNTER — Ambulatory Visit
Admission: RE | Admit: 2022-10-31 | Discharge: 2022-10-31 | Disposition: A | Discharge status: Home / Self Care | Payer: Medicare HMO | Source: Ambulatory Visit | Attending: Radiation Oncology

## 2022-10-31 ENCOUNTER — Ambulatory Visit
Admission: RE | Admit: 2022-10-31 | Discharge: 2022-10-31 | Disposition: A | Discharge status: Home / Self Care | Payer: Medicare HMO | Source: Ambulatory Visit | Attending: Radiation Oncology | Admitting: Radiation Oncology

## 2022-10-31 ENCOUNTER — Other Ambulatory Visit: Payer: Self-pay | Admitting: Radiation Oncology

## 2022-10-31 ENCOUNTER — Other Ambulatory Visit: Payer: Self-pay

## 2022-10-31 DIAGNOSIS — C12 Malignant neoplasm of pyriform sinus: Secondary | ICD-10-CM

## 2022-10-31 DIAGNOSIS — J387 Other diseases of larynx: Secondary | ICD-10-CM

## 2022-10-31 LAB — RAD ONC ARIA SESSION SUMMARY
Course Elapsed Days: 21
Plan Fractions Treated to Date: 16
Plan Prescribed Dose Per Fraction: 2 Gy
Plan Total Fractions Prescribed: 30
Plan Total Prescribed Dose: 60 Gy
Reference Point Dosage Given to Date: 32 Gy
Reference Point Session Dosage Given: 2 Gy
Session Number: 16

## 2022-10-31 MED ORDER — HYDROCODONE-ACETAMINOPHEN 7.5-325 MG/15ML PO SOLN
10.0000 mL | ORAL | 0 refills | Status: DC | PRN
Start: 2022-10-31 — End: 2022-11-07

## 2022-10-31 MED ORDER — SONAFINE EX EMUL
1.0000 | Freq: Once | CUTANEOUS | Status: AC
  Administered 2022-10-31: 1 via TOPICAL

## 2022-10-31 NOTE — Progress Notes (Signed)
Nutrition Follow-up:  Patient with SCC of piriform sinus, p 16 positive.  Patient receiving radiation.   Met with patient and wife following radiation.  Reports that it is getting more difficult to swallowing.  Yesterday ate oatmeal with 2 scrambled eggs cooked in for breakfast.  Made a smoothie with ensure, berries, almond milk and whole milk and yogurt.  Later ate 2 popsciles.  Recently able to eat nutty buddy bar after letting it melt in mouth.  Had [place] stew that did not taste good recently.  Has reduced amount of water taking in to 48 oz because that is all he could do.      Medications: reviewed  Labs: reviewed  Anthropometrics:   Weight 208 lb today  207 lb last week in radiation  10/7 205 lb UBW of 206 lb Feb   NUTRITION DIAGNOSIS: Inadequate oral intake decreasing    INTERVENTION:  Recommend 350 calorie shakes as oral intake declining Recipes given on how to make high calorie smoothie Continue soft, moist high calorie foods    MONITORING, EVALUATION, GOAL: weight trends, intake   NEXT VISIT: Monday, Nov 4 after radiation  Azara Gemme B. Freida Busman, RD, LDN Registered Dietitian [phone]

## 2022-11-01 ENCOUNTER — Other Ambulatory Visit: Payer: Self-pay

## 2022-11-01 ENCOUNTER — Ambulatory Visit
Admission: RE | Admit: 2022-11-01 | Discharge: 2022-11-01 | Disposition: A | Discharge status: Home / Self Care | Payer: Medicare HMO | Source: Ambulatory Visit | Attending: Radiation Oncology | Admitting: Radiation Oncology

## 2022-11-01 DIAGNOSIS — J387 Other diseases of larynx: Secondary | ICD-10-CM | POA: Diagnosis not present

## 2022-11-01 LAB — RAD ONC ARIA SESSION SUMMARY
Course Elapsed Days: 22
Plan Fractions Treated to Date: 17
Plan Prescribed Dose Per Fraction: 2 Gy
Plan Total Fractions Prescribed: 30
Plan Total Prescribed Dose: 60 Gy
Reference Point Dosage Given to Date: 34 Gy
Reference Point Session Dosage Given: 2 Gy
Session Number: 17

## 2022-11-02 ENCOUNTER — Ambulatory Visit
Admission: RE | Admit: 2022-11-02 | Discharge: 2022-11-02 | Disposition: A | Discharge status: Home / Self Care | Payer: Medicare HMO | Source: Ambulatory Visit | Attending: Radiation Oncology | Admitting: Radiation Oncology

## 2022-11-02 ENCOUNTER — Other Ambulatory Visit: Payer: Self-pay

## 2022-11-02 DIAGNOSIS — J387 Other diseases of larynx: Secondary | ICD-10-CM | POA: Diagnosis not present

## 2022-11-02 LAB — RAD ONC ARIA SESSION SUMMARY
Course Elapsed Days: 23
Plan Fractions Treated to Date: 18
Plan Prescribed Dose Per Fraction: 2 Gy
Plan Total Fractions Prescribed: 30
Plan Total Prescribed Dose: 60 Gy
Reference Point Dosage Given to Date: 36 Gy
Reference Point Session Dosage Given: 2 Gy
Session Number: 18

## 2022-11-03 ENCOUNTER — Ambulatory Visit
Admission: RE | Admit: 2022-11-03 | Discharge: 2022-11-03 | Disposition: A | Discharge status: Home / Self Care | Payer: Medicare HMO | Source: Ambulatory Visit | Attending: Radiation Oncology | Admitting: Radiation Oncology

## 2022-11-03 ENCOUNTER — Other Ambulatory Visit: Payer: Self-pay

## 2022-11-03 DIAGNOSIS — J387 Other diseases of larynx: Secondary | ICD-10-CM | POA: Diagnosis not present

## 2022-11-03 LAB — RAD ONC ARIA SESSION SUMMARY
Course Elapsed Days: 24
Plan Fractions Treated to Date: 19
Plan Prescribed Dose Per Fraction: 2 Gy
Plan Total Fractions Prescribed: 30
Plan Total Prescribed Dose: 60 Gy
Reference Point Dosage Given to Date: 38 Gy
Reference Point Session Dosage Given: 2 Gy
Session Number: 19

## 2022-11-04 ENCOUNTER — Other Ambulatory Visit: Payer: Self-pay

## 2022-11-04 ENCOUNTER — Ambulatory Visit
Admission: RE | Admit: 2022-11-04 | Discharge: 2022-11-04 | Disposition: A | Discharge status: Home / Self Care | Payer: Medicare HMO | Source: Ambulatory Visit | Attending: Radiation Oncology | Admitting: Radiation Oncology

## 2022-11-04 DIAGNOSIS — J387 Other diseases of larynx: Secondary | ICD-10-CM | POA: Diagnosis not present

## 2022-11-04 LAB — RAD ONC ARIA SESSION SUMMARY
Course Elapsed Days: 25
Plan Fractions Treated to Date: 20
Plan Prescribed Dose Per Fraction: 2 Gy
Plan Total Fractions Prescribed: 30
Plan Total Prescribed Dose: 60 Gy
Reference Point Dosage Given to Date: 40 Gy
Reference Point Session Dosage Given: 2 Gy
Session Number: 20

## 2022-11-07 ENCOUNTER — Ambulatory Visit
Admission: RE | Admit: 2022-11-07 | Discharge: 2022-11-07 | Disposition: A | Discharge status: Home / Self Care | Payer: Medicare HMO | Source: Ambulatory Visit | Attending: Radiation Oncology | Admitting: Radiation Oncology

## 2022-11-07 ENCOUNTER — Other Ambulatory Visit: Payer: Self-pay

## 2022-11-07 ENCOUNTER — Other Ambulatory Visit: Payer: Self-pay | Admitting: Radiation Oncology

## 2022-11-07 DIAGNOSIS — J387 Other diseases of larynx: Secondary | ICD-10-CM | POA: Diagnosis not present

## 2022-11-07 DIAGNOSIS — C12 Malignant neoplasm of pyriform sinus: Secondary | ICD-10-CM

## 2022-11-07 LAB — RAD ONC ARIA SESSION SUMMARY
Course Elapsed Days: 28
Plan Fractions Treated to Date: 21
Plan Prescribed Dose Per Fraction: 2 Gy
Plan Total Fractions Prescribed: 30
Plan Total Prescribed Dose: 60 Gy
Reference Point Dosage Given to Date: 42 Gy
Reference Point Session Dosage Given: 2 Gy
Session Number: 21

## 2022-11-07 MED ORDER — HYDROCODONE-ACETAMINOPHEN 7.5-325 MG/15ML PO SOLN
10.0000 mL | ORAL | 0 refills | Status: DC | PRN
Start: 2022-11-07 — End: 2022-11-08

## 2022-11-08 ENCOUNTER — Telehealth: Payer: Self-pay

## 2022-11-08 ENCOUNTER — Other Ambulatory Visit: Payer: Self-pay

## 2022-11-08 ENCOUNTER — Other Ambulatory Visit: Payer: Self-pay | Admitting: Radiology

## 2022-11-08 ENCOUNTER — Ambulatory Visit
Admission: RE | Admit: 2022-11-08 | Discharge: 2022-11-08 | Disposition: A | Discharge status: Home / Self Care | Payer: Medicare HMO | Source: Ambulatory Visit | Attending: Radiation Oncology

## 2022-11-08 DIAGNOSIS — C12 Malignant neoplasm of pyriform sinus: Secondary | ICD-10-CM

## 2022-11-08 DIAGNOSIS — J387 Other diseases of larynx: Secondary | ICD-10-CM | POA: Diagnosis not present

## 2022-11-08 LAB — RAD ONC ARIA SESSION SUMMARY
Course Elapsed Days: 29
Plan Fractions Treated to Date: 22
Plan Prescribed Dose Per Fraction: 2 Gy
Plan Total Fractions Prescribed: 30
Plan Total Prescribed Dose: 60 Gy
Reference Point Dosage Given to Date: 44 Gy
Reference Point Session Dosage Given: 2 Gy
Session Number: 22

## 2022-11-08 MED ORDER — HYDROCODONE-ACETAMINOPHEN 7.5-325 MG/15ML PO SOLN
15.0000 mL | ORAL | 0 refills | Status: DC | PRN
Start: 2022-11-08 — End: 2022-11-17

## 2022-11-08 NOTE — Telephone Encounter (Signed)
RN called pt to let him know that his Hycet solution prescription had been [facility] to [facility]. Pt wife had called to let RN know that the medication was not in stock and needed to be called into another pharmacy. [facility] and did they did have the medication in stock.

## 2022-11-09 ENCOUNTER — Ambulatory Visit
Admission: RE | Admit: 2022-11-09 | Discharge: 2022-11-09 | Disposition: A | Discharge status: Home / Self Care | Payer: Medicare HMO | Source: Ambulatory Visit | Attending: Radiation Oncology

## 2022-11-09 ENCOUNTER — Other Ambulatory Visit: Payer: Self-pay

## 2022-11-09 DIAGNOSIS — J387 Other diseases of larynx: Secondary | ICD-10-CM | POA: Diagnosis not present

## 2022-11-09 LAB — RAD ONC ARIA SESSION SUMMARY
Course Elapsed Days: 30
Plan Fractions Treated to Date: 23
Plan Prescribed Dose Per Fraction: 2 Gy
Plan Total Fractions Prescribed: 30
Plan Total Prescribed Dose: 60 Gy
Reference Point Dosage Given to Date: 46 Gy
Reference Point Session Dosage Given: 2 Gy
Session Number: 23

## 2022-11-10 ENCOUNTER — Ambulatory Visit
Admission: RE | Admit: 2022-11-10 | Discharge: 2022-11-10 | Disposition: A | Discharge status: Home / Self Care | Payer: Medicare HMO | Source: Ambulatory Visit | Attending: Radiation Oncology | Admitting: Radiation Oncology

## 2022-11-10 ENCOUNTER — Other Ambulatory Visit: Payer: Self-pay

## 2022-11-10 ENCOUNTER — Telehealth: Payer: Self-pay

## 2022-11-10 ENCOUNTER — Other Ambulatory Visit: Payer: Self-pay | Admitting: Radiology

## 2022-11-10 DIAGNOSIS — C12 Malignant neoplasm of pyriform sinus: Secondary | ICD-10-CM

## 2022-11-10 DIAGNOSIS — J387 Other diseases of larynx: Secondary | ICD-10-CM | POA: Diagnosis not present

## 2022-11-10 LAB — RAD ONC ARIA SESSION SUMMARY
Course Elapsed Days: 31
Plan Fractions Treated to Date: 24
Plan Prescribed Dose Per Fraction: 2 Gy
Plan Total Fractions Prescribed: 30
Plan Total Prescribed Dose: 60 Gy
Reference Point Dosage Given to Date: 48 Gy
Reference Point Session Dosage Given: 2 Gy
Session Number: 24

## 2022-11-10 MED ORDER — ONDANSETRON HCL 8 MG PO TABS
8.0000 mg | ORAL_TABLET | Freq: Three times a day (TID) | ORAL | 2 refills | Status: DC | PRN
Start: 2022-11-10 — End: 2022-12-13

## 2022-11-10 NOTE — Telephone Encounter (Signed)
RN called pt to let him know that his Zofran had been called into the pharmacy. Pt had contacted [facility] concerning nausea that had started recently and turned into nausea today. Pt denies fevers or chills. Pt also denies dizziness or weakness. Rn instructed pt how to take the medication. Pt and family know to call back with any concerns.

## 2022-11-11 ENCOUNTER — Ambulatory Visit
Admission: RE | Admit: 2022-11-11 | Discharge: 2022-11-11 | Disposition: A | Discharge status: Home / Self Care | Payer: Medicare HMO | Source: Ambulatory Visit | Attending: Radiation Oncology | Admitting: Radiation Oncology

## 2022-11-11 ENCOUNTER — Other Ambulatory Visit: Payer: Self-pay

## 2022-11-11 DIAGNOSIS — C12 Malignant neoplasm of pyriform sinus: Secondary | ICD-10-CM | POA: Insufficient documentation

## 2022-11-11 LAB — RAD ONC ARIA SESSION SUMMARY
Course Elapsed Days: 32
Plan Fractions Treated to Date: 25
Plan Prescribed Dose Per Fraction: 2 Gy
Plan Total Fractions Prescribed: 30
Plan Total Prescribed Dose: 60 Gy
Reference Point Dosage Given to Date: 50 Gy
Reference Point Session Dosage Given: 2 Gy
Session Number: 25

## 2022-11-14 ENCOUNTER — Other Ambulatory Visit: Payer: Self-pay | Admitting: Radiation Oncology

## 2022-11-14 ENCOUNTER — Inpatient Hospital Stay: Payer: Medicare HMO | Attending: Radiation Oncology

## 2022-11-14 ENCOUNTER — Ambulatory Visit: Payer: Medicare HMO

## 2022-11-14 ENCOUNTER — Other Ambulatory Visit: Payer: Self-pay

## 2022-11-14 ENCOUNTER — Ambulatory Visit
Admission: RE | Admit: 2022-11-14 | Discharge: 2022-11-14 | Disposition: A | Discharge status: Home / Self Care | Payer: Medicare HMO | Source: Ambulatory Visit | Attending: Radiation Oncology | Admitting: Radiation Oncology

## 2022-11-14 ENCOUNTER — Telehealth: Payer: Self-pay

## 2022-11-14 DIAGNOSIS — C12 Malignant neoplasm of pyriform sinus: Secondary | ICD-10-CM

## 2022-11-14 DIAGNOSIS — C139 Malignant neoplasm of hypopharynx, unspecified: Secondary | ICD-10-CM | POA: Insufficient documentation

## 2022-11-14 LAB — RAD ONC ARIA SESSION SUMMARY
Course Elapsed Days: 35
Plan Fractions Treated to Date: 26
Plan Prescribed Dose Per Fraction: 2 Gy
Plan Total Fractions Prescribed: 30
Plan Total Prescribed Dose: 60 Gy
Reference Point Dosage Given to Date: 52 Gy
Reference Point Session Dosage Given: 2 Gy
Session Number: 26

## 2022-11-14 LAB — BASIC METABOLIC PANEL - CANCER CENTER ONLY
Anion gap: 7 (ref 5–15)
BUN: 31 mg/dL — ABNORMAL HIGH (ref 8–23)
CO2: 30 mmol/L (ref 22–32)
Calcium: 9.7 mg/dL (ref 8.9–10.3)
Chloride: 97 mmol/L — ABNORMAL LOW (ref 98–111)
Creatinine: 1.11 mg/dL (ref 0.61–1.24)
GFR, Estimated: >60 mL/min (ref >60)
Glucose, Bld: 134 mg/dL — ABNORMAL HIGH (ref 70–99)
Potassium: 4.5 mmol/L (ref 3.5–5.1)
Sodium: 134 mmol/L — ABNORMAL LOW (ref 135–145)

## 2022-11-14 MED ORDER — LORAZEPAM 0.5 MG PO TABS
0.5000 mg | ORAL_TABLET | Freq: Three times a day (TID) | ORAL | 0 refills | Status: DC
Start: 2022-11-14 — End: 2022-12-22

## 2022-11-14 MED ORDER — SONAFINE EX EMUL
1.0000 | Freq: Two times a day (BID) | CUTANEOUS | Status: DC
  Administered 2022-11-14: 1 via TOPICAL

## 2022-11-14 NOTE — Telephone Encounter (Addendum)
Called and spoke with patient's wife to inform her/patient of patient's need to receive IVFs for the next few weeks per Dr. Colletta Maryland request. Confirmed that patient will be able to attend tomorrow's infusion appointment at 07:30.   High priority scheduling message sent to get remaining IVF appointments scheduled.   ----- Message from Lonie Peak sent at 11/14/2022 12:46 PM EST ----- Sorry, but the "onc" or "p onc" is not working right now - his BUN /Cr ratio is high.  He needs IV fluids 3 times a week for the next 4 weeks. Can you arrange for this to start tomorrow? 1L NS @500cc /hr. Thanks! Sarah ----- Message ----- From: Leory Plowman, [facility] [place] Sent: 11/14/2022  12:40 PM EST To: Lonie Peak, MD

## 2022-11-14 NOTE — Progress Notes (Signed)
Nutrition Follow-up:  Patient with SCC of piriform sinus, p 16 positive.  Patient receiving radiation only.   Met with patient and wife.  Patient with decreased intake. Able to eat 2 raspberry yogurts yesterday and drink ensure (thinned with milk).  Ate jello as well but burned.  Able to get gingerale down.  Drinking water but taste salty.  Taste alterations and pain are effecting ability to eat.      Medications: reviewed  Labs: Dr Basilio Cairo ordering labs for today.  Not resulted as of this writing  Anthropometrics:   Weight 194 lb 4 oz today per [facility]  208 lb on 10/21 205 lb on 10/7  UBW of 206 lb Feb  6% weight loss in the last 2 weeks, significant   NUTRITION DIAGNOSIS: Inadequate oral intake decreasing   INTERVENTION:  Samples of [employer] 1.4 (flavored) given as sample to try for more calories and protein.  Discussed boost VHC shake (530 calories) as option for more calories and protein (available on [employer]).  Discussed ways to thin and make into shake (using raspberry flavor) Patient wants to try high calorie shakes and avoid feeding tube if possible    MONITORING, EVALUATION, GOAL: weight trends, intake   NEXT VISIT: Monday, Nov 18 phone follow-up  Graylin Sperling B. Freida Busman, RD, LDN Registered Dietitian [phone]

## 2022-11-15 ENCOUNTER — Ambulatory Visit
Admission: RE | Admit: 2022-11-15 | Discharge: 2022-11-15 | Disposition: A | Discharge status: Home / Self Care | Payer: Medicare HMO | Source: Ambulatory Visit | Attending: Radiation Oncology | Admitting: Radiation Oncology

## 2022-11-15 ENCOUNTER — Inpatient Hospital Stay: Payer: Medicare HMO

## 2022-11-15 ENCOUNTER — Telehealth: Payer: Self-pay

## 2022-11-15 ENCOUNTER — Other Ambulatory Visit: Payer: Self-pay

## 2022-11-15 VITALS — BP 125/67 | HR 64 | Temp 98.3°F | Resp 14

## 2022-11-15 DIAGNOSIS — C139 Malignant neoplasm of hypopharynx, unspecified: Secondary | ICD-10-CM | POA: Diagnosis present

## 2022-11-15 DIAGNOSIS — C12 Malignant neoplasm of pyriform sinus: Secondary | ICD-10-CM

## 2022-11-15 LAB — RAD ONC ARIA SESSION SUMMARY
Course Elapsed Days: 36
Plan Fractions Treated to Date: 27
Plan Prescribed Dose Per Fraction: 2 Gy
Plan Total Fractions Prescribed: 30
Plan Total Prescribed Dose: 60 Gy
Reference Point Dosage Given to Date: 54 Gy
Reference Point Session Dosage Given: 2 Gy
Session Number: 27

## 2022-11-15 MED ORDER — SODIUM CHLORIDE 0.9 % IV SOLN: Freq: Once | INTRAVENOUS | Status: AC

## 2022-11-15 NOTE — Telephone Encounter (Signed)
Per supportive care plan patient is aware fo scheduled appointment times/dates; patient has also requested a calendar and notation has been made in his next upcoming appointment

## 2022-11-15 NOTE — Patient Instructions (Signed)

## 2022-11-16 ENCOUNTER — Inpatient Hospital Stay: Payer: Medicare HMO

## 2022-11-16 ENCOUNTER — Ambulatory Visit
Admission: RE | Admit: 2022-11-16 | Discharge: 2022-11-16 | Disposition: A | Discharge status: Home / Self Care | Payer: Medicare HMO | Source: Ambulatory Visit | Attending: Radiation Oncology

## 2022-11-16 ENCOUNTER — Other Ambulatory Visit: Payer: Self-pay

## 2022-11-16 VITALS — BP 115/58 | HR 56 | Temp 98.2°F | Resp 17

## 2022-11-16 DIAGNOSIS — C12 Malignant neoplasm of pyriform sinus: Secondary | ICD-10-CM

## 2022-11-16 DIAGNOSIS — C139 Malignant neoplasm of hypopharynx, unspecified: Secondary | ICD-10-CM | POA: Diagnosis not present

## 2022-11-16 LAB — RAD ONC ARIA SESSION SUMMARY
Course Elapsed Days: 37
Plan Fractions Treated to Date: 28
Plan Prescribed Dose Per Fraction: 2 Gy
Plan Total Fractions Prescribed: 30
Plan Total Prescribed Dose: 60 Gy
Reference Point Dosage Given to Date: 56 Gy
Reference Point Session Dosage Given: 2 Gy
Session Number: 28

## 2022-11-16 MED ORDER — SODIUM CHLORIDE 0.9 % IV SOLN
Freq: Once | INTRAVENOUS | Status: AC
Start: 2022-11-16 — End: 2022-11-16

## 2022-11-16 NOTE — Patient Instructions (Signed)

## 2022-11-17 ENCOUNTER — Other Ambulatory Visit: Payer: Self-pay

## 2022-11-17 ENCOUNTER — Other Ambulatory Visit: Payer: Self-pay | Admitting: Radiology

## 2022-11-17 ENCOUNTER — Ambulatory Visit
Admission: RE | Admit: 2022-11-17 | Discharge: 2022-11-17 | Disposition: A | Discharge status: Home / Self Care | Payer: Medicare HMO | Source: Ambulatory Visit | Attending: Radiation Oncology | Admitting: Radiation Oncology

## 2022-11-17 ENCOUNTER — Inpatient Hospital Stay: Payer: Medicare HMO

## 2022-11-17 ENCOUNTER — Ambulatory Visit: Payer: Medicare HMO | Attending: Radiation Oncology

## 2022-11-17 VITALS — BP 120/55 | HR 56 | Temp 97.4°F | Resp 18

## 2022-11-17 DIAGNOSIS — R1310 Dysphagia, unspecified: Secondary | ICD-10-CM | POA: Diagnosis present

## 2022-11-17 DIAGNOSIS — C12 Malignant neoplasm of pyriform sinus: Secondary | ICD-10-CM | POA: Diagnosis not present

## 2022-11-17 DIAGNOSIS — C139 Malignant neoplasm of hypopharynx, unspecified: Secondary | ICD-10-CM | POA: Diagnosis not present

## 2022-11-17 LAB — RAD ONC ARIA SESSION SUMMARY
Course Elapsed Days: 38
Plan Fractions Treated to Date: 29
Plan Prescribed Dose Per Fraction: 2 Gy
Plan Total Fractions Prescribed: 30
Plan Total Prescribed Dose: 60 Gy
Reference Point Dosage Given to Date: 58 Gy
Reference Point Session Dosage Given: 2 Gy
Session Number: 29

## 2022-11-17 MED ORDER — SODIUM CHLORIDE 0.9 % IV SOLN
Freq: Once | INTRAVENOUS | Status: AC
Start: 2022-11-17 — End: 2022-11-17

## 2022-11-17 MED ORDER — HYDROCODONE-ACETAMINOPHEN 7.5-325 MG/15ML PO SOLN: 15.0000 mL | ORAL | 0 refills | Status: DC | PRN

## 2022-11-17 NOTE — Therapy (Signed)
OUTPATIENT SPEECH LANGUAGE PATHOLOGY ONCOLOGY TREATMENT   Patient Name: Randall [name] MRN: [id number] DOB:July 29, 1955, 67 y.o., male Today's Date: 11/17/2022  PCP: Benedetto Goad, MD REFERRING PROVIDER: Lonie Peak, MD  END OF SESSION:  End of Session - 11/17/22 1719     Visit Number 2    Number of Visits 7    Date for SLP Re-Evaluation 01/11/23    SLP Start Time 1353    SLP Stop Time  1431    SLP Time Calculation (min) 38 min    Activity Tolerance Patient tolerated treatment well              Past Medical History:  Diagnosis Date   Complication of anesthesia    Diabetes mellitus without complication (HCC)    Elevated coronary artery calcium score 02/02/2021   GERD (gastroesophageal reflux disease)    History of kidney stones 2014   Hypertension    Kidney stone 06/17/2015   PONV (postoperative nausea and vomiting)    Rupture of right quadriceps tendon 09/21/2020   - s/p srugical repair   Tuberculosis 1980   Type 2 diabetes mellitus without complication, without long-term current use of insulin (HCC) 03/21/2020   Past Surgical History:  Procedure Laterality Date   KIDNEY SURGERY     KNEE SURGERY     LITHOTRIPSY     MICROLARYNGOSCOPY Bilateral 06/17/2022   Procedure: DIRECT LARYNGOSCOPY WITH BIOPSY;  Surgeon: Laren Boom, DO;  Location: [facility] OR;  Service: ENT;  Laterality: Bilateral;   QUADRICEPS TENDON REPAIR Right 09/25/2020   Procedure: REPAIR RIGHT QUADRICEP TENDON;  Surgeon: Nadara Mustard, MD;  Location: [facility] OR;  Service: Orthopedics;  Laterality: Right;   TONSILLECTOMY     Patient Active Problem List   Diagnosis Date Noted   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   Hyperlipidemia LDL goal <70 02/21/2022   Elevated coronary artery calcium score 01/26/2021   Thoracic aortic atherosclerosis (HCC) 01/26/2021   Decreased left ventricular systolic function 12/25/2020   Resistant hypertension 11/13/2020   Right bundle  branch block 11/13/2020   Hypertensive heart disease with chronic diastolic congestive heart failure (HCC) 11/13/2020   Quadriceps tendon rupture, right, sequela     ONSET DATE: January 2024   REFERRING DIAG: Pyriform Sinus Cancer  THERAPY DIAG:  Dysphagia, unspecified type  Rationale for Evaluation and Treatment: Rehabilitation  SUBJECTIVE:   SUBJECTIVE STATEMENT: Pt denies any overt s/sx oral or pharyngeal dysphagia Pt accompanied by: significant other  PERTINENT HISTORY:  :    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 Hx: He presented with bilateral neck swelling in Jan 2024. He was also diagnosed with COVID around that time and he attributed his symptoms to that. The right neck swelling continued and he presented to his PCP in April 2024 for further evaluation. 05/05/22 A soft tissue neck US demonstrated a nonspecific 3.2 x 1.5 x 2.5 cm hypoechoic lesion in the right neck, correlating with the palpable site of concern. 05/25/22 CT neck demonstrated a soft tissue mass centered within the right piriform sinus,  measuring up to 22 x 8 mm, concerning for malignancy. CT also demonstrated a right level 3 lymph node measuring up to 2.8 x 1.8 cm, with central necrosis (correlating with the hypoechoic mass seen on neck ultrasound) suspicious for metastatic disease, and indeterminate asymmetric prominence of right level 2A lymph node, measuring up to 16 x 10 mm   06/14/22 He saw Dr.  Skotnicki. Laryngoscopy performed during this visit revealed a mass lesion of the right piriform sinus which appeared to extend in to the arytenoid and partially onto the area epiglottic fold. 06/17/22 He underwent direct laryngoscopy to obtain biopsies. Biopsy of the right pyriform sinus showed findings consistent with invasive poorly differentiated squamous cell carcinoma (nonkeratinizing). A post cricoid biopsy was also collected and showed no evidence of malignancy. 06/28/22 He met  with Dr. Hezzie Bump to discuss treatment options- chemo/radiation vs surgery with possible post op radiation.07/04/22 Consult with Dr. Basilio Cairo. 07/07/22 PET demonstrated the right piriform sinus primary measuring 1.4 cm with an SUV max of 5.9, and the known level 3 ipsilateral nodal metastasis measuring 2.7 cm with an SUV max of 3.2. PET otherwise showed no evidence of extracervical hypermetabolic metastatic disease, and a nonspecific 2 mm LLL pulmonary nodule. He met with Dr. Al Pimple on 6/28 to discuss the option of chemotherapy but ultimately decided to proceed with surgery. 08/26/22 endoscopic CO2 laser excision of the hypopharyngeal mass and bilateral neck dissection by Dr. Hezzie Bump. Based on final pathology showing no evidence of extra nodal extension in the positive excised lymph node chemotherapy is not recommended only adjuvant radiation. 09/21/22 Follow up with Dr. Basilio Cairo. He will receive post operative radiation only. Treatment plan:  He will receive 35 fractions of radiation to his Hypopharynx and bilateral neck.  He started on 9/30 and he will complete 11/8  PAIN:  Are you having pain? Yes: NPRS scale: 7/10 Pain location: throat  Pain description: constant hurt, burning Aggravating factors: swallowing Relieving factors: meds  FALLS: Has patient fallen in last 6 months?  No  PATIENT GOALS: "Do everything I can"  OBJECTIVE:  Note: Objective measures were completed at Evaluation unless otherwise noted.   TODAY'S TREATMENT:                                                                                                                                         DATE:   11/17/22: Pt has been performing HEP as well as he can - is holding to scope of HEP as directed with reduced reps. He performed one rep of each exercise for SLP with independence.  He told SLP rationale for HEP with independence. SLP told pt and wife about food journal and pt told SLP benefits of this.   10/13/22 (eval): Research states  the risk for dysphagia increases due to radiation and/or chemotherapy treatment due to a variety of factors, so SLP educated the pt about the possibility of reduced/limited ability for PO intake during rad tx. SLP also educated pt regarding possible changes to swallowing musculature after rad tx, and why adherence to dysphagia HEP provided today and PO consumption was necessary to inhibit muscle fibrosis following rad tx and to mitigate muscle disuse atrophy. SLP informed pt why this would be detrimental to their swallowing status and to their pulmonary health. Pt demonstrated understanding  of these things to SLP. SLP encouraged pt to safely eat and drink as deep into their radiation/chemotherapy as possible to provide the best possible long-term swallowing outcome for pt.  SLP then developed an individualized HEP for pt involving oral and pharyngeal strengthening and ROM and pt was instructed how to perform these exercises, including SLP demonstration. After SLP demonstration, pt return demonstrated each exercise. SLP ensured pt performance was correct prior to educating pt on next exercise. Pt required min cues faded to modified independent to perform HEP. Pt was instructed to complete this program 6-7 days/week, at least 2 times a day until 6 months after his or her last day of rad tx, and then x2 a week after that, indefinitely. Among other modifications for days when pt cannot functionally swallow, SLP also suggested pt to perform only non-swallowing tasks on the handout/HEP, and if necessary to cycle through the swallowing portion so the full program of exercises can be completed instead of fatiguing on one of the swallowing exercises and being unable to perform the other swallowing exercises. SLP instructed that swallowing exercises should then be added back into the regimen as pt is able to do so.   PATIENT EDUCATION: Education details: see "today's treatment" Person educated: Patient and  Spouse Education method: See "today's treatment" Education comprehension: see "Today's treatment"  ASSESSMENT:  CLINICAL IMPRESSION: Patient is a 67 y.o. M who was seen today for treatment of swallowing as they undergo radiation/chemoradiation therapy. Today pt politely refused solids, but drank thin liquids without overt s/s oral or pharyngeal difficulty. At this time pt swallowing is deemed WNL/WFL with these POs. There are no overt s/s aspiration PNA observed by SLP nor any reported by pt at this time. Data indicate that pt's swallow ability will likely decrease over the course of radiation/chemoradiation therapy and could very well decline over time following the conclusion of that therapy due to muscle disuse atrophy and/or muscle fibrosis. Pt will cont to need to be seen by SLP in order to assess safety of PO intake, assess the need for recommending any objective swallow assessment, and ensuring pt is correctly completing the individualized HEP.   OBJECTIVE IMPAIRMENTS: include voice disorder and dysphagia. These impairments are limiting patient from effectively communicating at home and in community and safety when swallowing. Factors affecting potential to achieve goals and functional outcome are  none noted . Patient will benefit from skilled SLP services to address above impairments and improve overall function.   REHAB POTENTIAL: Good   GOALS: Goals reviewed with patient? No   SHORT TERM GOALS: Target: 3rd total session   1. Pt will complete HEP with modified independence in 2 sessions Baseline: 11/17/22 Goal status: INITIAL   2.  pt will tell SLP why pt is completing HEP with modified independence Baseline:  Goal status: met   3.  pt will describe 3 overt s/s aspiration PNA with modified independence Baseline:  Goal status: INITIAL   4.  pt will tell SLP how a food journal could hasten return to a more normalized diet Baseline:  Goal status: met     LONG TERM GOALS:  Target: 7th total session   1.  pt will complete HEP with independence over two visits Baseline: 11//7/24 Goal status: INITIAL   2.  pt will describe how to modify HEP over time, and the timeline associated with reduction in HEP frequency with modified independence over two sessions Baseline:  Goal status: INITIAL     PLAN:  SLP FREQUENCY:  once approx every 4 weeks   SLP DURATION:  7 sessions   PLANNED INTERVENTIONS: Aspiration precaution training, Pharyngeal strengthening exercises, Diet toleration management , Trials of upgraded texture/liquids, SLP instruction and feedback, Compensatory strategies, and Patient/family education    [facility], CCC-SLP 11/17/2022, 5:22 PM

## 2022-11-18 ENCOUNTER — Ambulatory Visit
Admission: RE | Admit: 2022-11-18 | Discharge: 2022-11-18 | Disposition: A | Discharge status: Home / Self Care | Payer: Medicare HMO | Source: Ambulatory Visit | Attending: Radiation Oncology | Admitting: Radiation Oncology

## 2022-11-18 ENCOUNTER — Other Ambulatory Visit: Payer: Self-pay

## 2022-11-18 DIAGNOSIS — C12 Malignant neoplasm of pyriform sinus: Secondary | ICD-10-CM | POA: Diagnosis not present

## 2022-11-18 LAB — RAD ONC ARIA SESSION SUMMARY
Course Elapsed Days: 39
Plan Fractions Treated to Date: 30
Plan Prescribed Dose Per Fraction: 2 Gy
Plan Total Fractions Prescribed: 30
Plan Total Prescribed Dose: 60 Gy
Reference Point Dosage Given to Date: 60 Gy
Reference Point Session Dosage Given: 2 Gy
Session Number: 30

## 2022-11-18 NOTE — Progress Notes (Signed)
Oncology Nurse Navigator Documentation   Met with Randall [name] after final RT to offer support and to celebrate end of radiation treatment.    Explained my role as navigator will continue for several more months, encouraged him to call me with needs/concerns.    Hedda Slade RN, BSN, OCN Head & Neck Oncology Nurse Navigator [facility] Phone # [phone]  Fax # [phone]

## 2022-11-21 NOTE — Radiation Completion Notes (Signed)
Patient Name: Randall [name], Randall [name] MRN: [id number] Date of Birth: 01/20/1955 Referring Physician: Corey Skains, M.D. Date of Service: 2022-11-21 Radiation Oncologist: Lonie Peak, M.D. [facility] [place]                             RADIATION ONCOLOGY END OF TREATMENT NOTE     Diagnosis: C12 Malignant neoplasm of pyriform sinus Staging on 2022-09-21: Pyriform sinus cancer (HCC) T=pT2, N=pN2a, M=cM0 Staging on 2022-07-04: Pyriform sinus cancer (HCC) T=cT2, N=cN2b, M=cM0 Intent: Curative     ==========DELIVERED PLANS==========  First Treatment Date: 2022-10-10 - Last Treatment Date: 2022-11-18   Plan Name: HN_Hypoph Site: Laryngopharynx Technique: IMRT Mode: Photon Dose Per Fraction: 2 Gy Prescribed Dose (Delivered / Prescribed): 60 Gy / 60 Gy Prescribed Fxs (Delivered / Prescribed): 30 / 30     ==========ON TREATMENT VISIT DATES========== 2022-10-10, 2022-10-17, 2022-10-24, 2022-10-31, 2022-11-07, 2022-11-14     ==========UPCOMING VISITS==========       ==========APPENDIX - ON TREATMENT VISIT NOTES==========   See weekly On Treatment Notes in Epic for details.

## 2022-11-22 ENCOUNTER — Inpatient Hospital Stay: Payer: Medicare HMO

## 2022-11-22 VITALS — BP 108/54 | HR 67 | Temp 98.3°F | Resp 16

## 2022-11-22 DIAGNOSIS — C12 Malignant neoplasm of pyriform sinus: Secondary | ICD-10-CM

## 2022-11-22 DIAGNOSIS — C139 Malignant neoplasm of hypopharynx, unspecified: Secondary | ICD-10-CM | POA: Diagnosis not present

## 2022-11-22 MED ORDER — SODIUM CHLORIDE 0.9 % IV SOLN
Freq: Once | INTRAVENOUS | Status: AC
Start: 2022-11-22 — End: 2022-11-22

## 2022-11-22 NOTE — Patient Instructions (Signed)

## 2022-11-23 ENCOUNTER — Telehealth: Payer: Self-pay

## 2022-11-23 ENCOUNTER — Other Ambulatory Visit: Payer: Self-pay

## 2022-11-23 ENCOUNTER — Inpatient Hospital Stay: Payer: Medicare HMO

## 2022-11-23 ENCOUNTER — Other Ambulatory Visit: Payer: Self-pay | Admitting: Radiology

## 2022-11-23 VITALS — BP 101/48 | HR 60 | Resp 16

## 2022-11-23 DIAGNOSIS — C139 Malignant neoplasm of hypopharynx, unspecified: Secondary | ICD-10-CM | POA: Diagnosis not present

## 2022-11-23 DIAGNOSIS — C12 Malignant neoplasm of pyriform sinus: Secondary | ICD-10-CM

## 2022-11-23 MED ORDER — SODIUM CHLORIDE 0.9 % IV SOLN: Freq: Once | INTRAVENOUS | Status: AC

## 2022-11-23 MED ORDER — HYDROCODONE-ACETAMINOPHEN 7.5-325 MG/15ML PO SOLN: 15.0000 mL | ORAL | 0 refills | Status: DC | PRN

## 2022-11-23 MED ORDER — FENTANYL 25 MCG/HR TD PT72: 1.0000 | MEDICATED_PATCH | TRANSDERMAL | 0 refills | Status: DC

## 2022-11-23 NOTE — Progress Notes (Signed)
[name] wife called our clinic today stating that her husband is experiencing a significant amount of pain despite taking Hycet q4h. I discussed his case with Dr. Basilio Cairo who recommends increasing his Hycet to q3h PRN and adding 25 mcg Fentanyl patch. Also encouraged patient to push fluids and liquid supplements. He will see Dr. Basilio Cairo on 12/02/2022 for follow-up. A prescription for Fentanyl and a refill for Hycet was sent to his preferred pharmacy today.     Joyice Faster, PA-C

## 2022-11-23 NOTE — Patient Instructions (Signed)
Rehydration, Adult  Rehydration is the replacement of fluids, salts, and minerals in the body (electrolytes) that are lost during dehydration. Dehydration is when there is not enough water or other fluids in the body. This happens when you lose more fluids than you take in. People who are age 66 or older have a higher risk of dehydration than younger adults. This is because in older age, the body: Is less able to maintain the right amount of water. Does not respond to temperature changes as well. Does not get a sense of thirst as easily or quickly. Other causes include: Not drinking enough fluids. This can occur when you are ill, when you forget to drink, or when you are doing activities that require a lot of energy, especially in hot weather. Conditions that cause loss of water or other fluids. These include diarrhea, vomiting, sweating, or urinating a lot. Other illnesses, such as fever or infection. Certain medicines, such as those that remove excess fluid from the body (diuretics). Symptoms of mild or moderate dehydration may include thirst, dry lips and mouth, and dizziness. Symptoms of severe dehydration may include increased heart rate, confusion, fainting, and not urinating. In severe cases, you may need to get fluids through an IV at the hospital. For mild or moderate cases, you can usually rehydrate at home by drinking certain fluids as told by your health care provider. What are the risks? Rehydration is usually safe. Taking in too much fluid (overhydration) can be a problem but is rare. Overhydration can cause an imbalance of electrolytes in the body, kidney failure, fluid in the lungs, or a decrease in salt (sodium) levels in the body. Supplies needed: You will need an oral rehydration solution (ORS) if your health care provider tells you to use one. This is a drink to treat dehydration. It can be found in pharmacies and retail stores. How to rehydrate Fluids Follow instructions from  your health care provider about what to drink. The kind of fluid and the amount you should drink depend on your condition. In general, you should choose drinks that you prefer. If told by your health care provider, drink an ORS. Make an ORS by following instructions on the package. Start by drinking small amounts, about  cup (120 mL) every 5-10 minutes. Slowly increase how much you drink until you have taken in the amount recommended by your health care provider. Drink enough clear fluids to keep your urine pale yellow. If you were told to drink an ORS, finish it first, then start slowly drinking other clear fluids. Drink fluids such as: Water. This includes sparkling and flavored water. Drinking only water can lead to having too little sodium in your body (hyponatremia). Follow the advice of your health care provider. Water from ice chips you suck on. Fruit juice with water added to it(diluted). Sports drinks. Hot or cold herbal teas. Broth-based soups. Coffee. Milk or milk products. Food Follow instructions from your health care provider about what to eat while you rehydrate. Your health care provider may recommend that you slowly begin eating regular foods in small amounts. Eat foods that contain a healthy balance of electrolytes, such as bananas, oranges, potatoes, tomatoes, and spinach. Avoid foods that are greasy or contain a lot of sugar. In some cases, you may get nutrition through a feeding tube that is passed through your nose and into your stomach (nasogastric tube, or NG tube). This may be done if you have uncontrolled vomiting or diarrhea. Drinks to avoid  Certain drinks may make dehydration worse. While you rehydrate, avoid drinking alcohol. How to tell if you are recovering from dehydration You may be getting better if: You are urinating more often than before you started rehydrating. Your urine is pale yellow. Your energy level improves. You vomit less often. You have  diarrhea less often. Your appetite improves or returns to normal. You feel less dizzy or light-headed. Your skin tone and color start to look more normal. Follow these instructions at home: Take over-the-counter and prescription medicines only as told by your health care provider. Do not take sodium tablets. Doing this can lead to having too much sodium in your body (hypernatremia). Contact a health care provider if: You continue to have symptoms of mild or moderate dehydration, such as: Thirst. Dry lips. Slightly dry mouth. Dizziness. Dark urine or less urine than usual. Muscle cramps. You continue to vomit or have diarrhea. Get help right away if: You have symptoms of dehydration that get worse. You have a fever. You have a severe headache. You have been vomiting and have problems, such as: Your vomiting gets worse. Your vomit includes blood or green matter (bile). You cannot eat or drink without vomiting. You have problems with urination or bowel movements, such as: Diarrhea that gets worse. Blood in your stool (feces). This may cause stool to look black and tarry. Not urinating, or urinating only a small amount of very dark urine, within 6-8 hours. You have trouble breathing. You have symptoms that get worse with treatment. These symptoms may be an emergency. Get help right away. Call 911. Do not wait to see if the symptoms will go away. Do not drive yourself to the hospital. This information is not intended to replace advice given to you by your health care provider. Make sure you discuss any questions you have with your health care provider. Document Revised: 05/12/2021 Document Reviewed: 05/10/2021 Elsevier Patient Education  2024 [employer].

## 2022-11-23 NOTE — Telephone Encounter (Signed)
Patient's wife called and left VM yesterday afternoon requesting additional sonafine lotion and to report that patient is currently not experiencing relief from taking Hycet prescription every 4hrs.   Relayed pain medication concerns to Joyice Faster, PA-C who consulted with Dr. Basilio Cairo. Was informed to advise patient/wife that he can increase frequency of pain medication to every 3 hrs and that a prescription for fentanyl patches will all be sent in to patient's preferred pharmacy for additional pain relief support. Called [facility] in [place] and confirmed that they have both the liquid hydrocodone and fentanyl patches in stock.   Returned wife's call and relayed above update. Informed her that it will take a day or 2 to feel the full effect from fentanyl patches. Also informed her that I would leave the additional tubes of sonafine upstairs in the infusion suite for patient to get when he comes in for IVF this afternoon. Wife verbalized understanding and appreciation of call.

## 2022-11-24 ENCOUNTER — Inpatient Hospital Stay: Payer: Medicare HMO

## 2022-11-24 VITALS — BP 104/55 | HR 59 | Temp 97.7°F | Resp 15

## 2022-11-24 DIAGNOSIS — C12 Malignant neoplasm of pyriform sinus: Secondary | ICD-10-CM

## 2022-11-24 DIAGNOSIS — C139 Malignant neoplasm of hypopharynx, unspecified: Secondary | ICD-10-CM | POA: Diagnosis not present

## 2022-11-24 MED ORDER — SODIUM CHLORIDE 0.9 % IV SOLN: Freq: Once | INTRAVENOUS | Status: AC

## 2022-11-24 NOTE — Patient Instructions (Signed)

## 2022-11-28 ENCOUNTER — Inpatient Hospital Stay: Payer: Medicare HMO

## 2022-11-28 NOTE — Progress Notes (Signed)
Nutrition Follow-up:  Patient with SCC of piriform sinus, p 16+.  Patient receiving radiation only.  Spoke with patient via phone.  Reports decreased intake.  For the last few days about to take in 24-31 oz of fluids. Yesterday able to get in 77 oz of fluids.  Has the pain patient and liquid hycet.  Able to take in gingerale, water and raspeberry yogurt.  All other foods taste horrible and/or are painful to swallow.  Gatorade burns.  Doing baking soda and salt water rinse 4-5 times a day and applying cream 4-5 times a day.  Receiving IV fluids 3 times a week.  Taking nausea medication when needed.   Medications: hycet, fentanyl patch  Labs: reviewed  Anthropometrics:   No new weight 194 lb 4 oz on 11/4 per [facility]  208 lb on 10/21 205 lb on 10/7   NUTRITION DIAGNOSIS: Inadequate oral intake continues    INTERVENTION:  Declined feeding tube at this time. Encouraged patient to try liquid IV, pedialyte.      MONITORING, EVALUATION, GOAL: weight trends, intake   NEXT VISIT: Monday, Nov 25 during fluids  Randall [name] B. Freida Busman, RD, LDN Registered Dietitian [phone]

## 2022-11-29 ENCOUNTER — Inpatient Hospital Stay: Payer: Medicare HMO

## 2022-11-29 VITALS — BP 109/58 | HR 63 | Temp 98.2°F | Resp 18

## 2022-11-29 DIAGNOSIS — C139 Malignant neoplasm of hypopharynx, unspecified: Secondary | ICD-10-CM | POA: Diagnosis not present

## 2022-11-29 DIAGNOSIS — C12 Malignant neoplasm of pyriform sinus: Secondary | ICD-10-CM

## 2022-11-29 MED ORDER — SODIUM CHLORIDE 0.9 % IV SOLN: Freq: Once | INTRAVENOUS | Status: AC

## 2022-11-29 NOTE — Patient Instructions (Signed)

## 2022-11-30 ENCOUNTER — Inpatient Hospital Stay: Payer: Medicare HMO

## 2022-11-30 VITALS — BP 136/71 | HR 60 | Temp 97.8°F | Resp 18 | Wt 187.8 lb

## 2022-11-30 DIAGNOSIS — C139 Malignant neoplasm of hypopharynx, unspecified: Secondary | ICD-10-CM | POA: Diagnosis not present

## 2022-11-30 DIAGNOSIS — C12 Malignant neoplasm of pyriform sinus: Secondary | ICD-10-CM

## 2022-11-30 MED ORDER — SODIUM CHLORIDE 0.9 % IV SOLN: Freq: Once | INTRAVENOUS | Status: AC

## 2022-11-30 NOTE — Patient Instructions (Signed)

## 2022-12-01 ENCOUNTER — Inpatient Hospital Stay: Payer: Medicare HMO

## 2022-12-01 ENCOUNTER — Other Ambulatory Visit: Payer: Self-pay

## 2022-12-01 ENCOUNTER — Other Ambulatory Visit: Payer: Self-pay | Admitting: Radiation Oncology

## 2022-12-01 VITALS — BP 116/55 | HR 100 | Temp 98.0°F | Resp 18

## 2022-12-01 DIAGNOSIS — C139 Malignant neoplasm of hypopharynx, unspecified: Secondary | ICD-10-CM | POA: Diagnosis not present

## 2022-12-01 DIAGNOSIS — C12 Malignant neoplasm of pyriform sinus: Secondary | ICD-10-CM

## 2022-12-01 MED ORDER — SODIUM CHLORIDE 0.9 % IV SOLN: Freq: Once | INTRAVENOUS | Status: AC

## 2022-12-01 NOTE — Patient Instructions (Signed)

## 2022-12-02 ENCOUNTER — Encounter: Payer: Self-pay | Admitting: Radiation Oncology

## 2022-12-02 ENCOUNTER — Ambulatory Visit
Admission: RE | Admit: 2022-12-02 | Discharge: 2022-12-02 | Disposition: A | Discharge status: Home / Self Care | Payer: Medicare HMO | Source: Ambulatory Visit | Attending: Radiation Oncology | Admitting: Radiation Oncology

## 2022-12-02 VITALS — BP 104/61 | HR 71 | Temp 97.5°F | Resp 18 | Wt 187.0 lb

## 2022-12-02 DIAGNOSIS — Z7982 Long term (current) use of aspirin: Secondary | ICD-10-CM | POA: Insufficient documentation

## 2022-12-02 DIAGNOSIS — Z791 Long term (current) use of non-steroidal anti-inflammatories (NSAID): Secondary | ICD-10-CM | POA: Diagnosis not present

## 2022-12-02 DIAGNOSIS — Z7984 Long term (current) use of oral hypoglycemic drugs: Secondary | ICD-10-CM | POA: Insufficient documentation

## 2022-12-02 DIAGNOSIS — Z79899 Other long term (current) drug therapy: Secondary | ICD-10-CM | POA: Insufficient documentation

## 2022-12-02 DIAGNOSIS — C12 Malignant neoplasm of pyriform sinus: Secondary | ICD-10-CM | POA: Insufficient documentation

## 2022-12-02 DIAGNOSIS — Z7985 Long-term (current) use of injectable non-insulin antidiabetic drugs: Secondary | ICD-10-CM | POA: Insufficient documentation

## 2022-12-02 LAB — BASIC METABOLIC PANEL - CANCER CENTER ONLY
Anion gap: 7 (ref 5–15)
BUN: 49 mg/dL — ABNORMAL HIGH (ref 8–23)
CO2: 29 mmol/L (ref 22–32)
Calcium: 9.1 mg/dL (ref 8.9–10.3)
Chloride: 107 mmol/L (ref 98–111)
Creatinine: 1.33 mg/dL — ABNORMAL HIGH (ref 0.61–1.24)
GFR, Estimated: 59 mL/min — ABNORMAL LOW (ref >60)
Glucose, Bld: 124 mg/dL — ABNORMAL HIGH (ref 70–99)
Potassium: 4 mmol/L (ref 3.5–5.1)
Sodium: 143 mmol/L (ref 135–145)

## 2022-12-02 MED ORDER — FENTANYL 25 MCG/HR TD PT72: 1.0000 | MEDICATED_PATCH | TRANSDERMAL | 0 refills | Status: DC

## 2022-12-02 MED ORDER — HYDROCODONE-ACETAMINOPHEN 7.5-325 MG/15ML PO SOLN: ORAL | 0 refills | Status: DC

## 2022-12-02 MED ORDER — SONAFINE EX EMUL
1.0000 | Freq: Two times a day (BID) | CUTANEOUS | Status: DC
Start: 2022-12-02 — End: 2022-12-03
  Administered 2022-12-02: 1 via TOPICAL

## 2022-12-02 NOTE — Progress Notes (Signed)
Randall [name] presents today for follow-up after completing radiation to his hypopharynx/supraglottis on 11/18/2022  Pain issues, if any: Continues to struggle with sore throat and burning sensation when he swallows Using a feeding tube?: N/A Weight changes, if any:  Wt Readings from Last 3 Encounters:  12/02/22 187 lb (84.8 kg)  11/30/22 187 lb 12 oz (85.2 kg)  09/21/22 195 lb 6 oz (88.6 kg)   Swallowing issues, if any: Yes--unable to swallow any solid food. Currently only able to tolerate yogurt and thin liquids.  Smoking or chewing tobacco? None Using fluoride toothpaste daily? Denies any mouth sores or dental concerns Last ENT visit was on: Not since diagnosis Other notable issues, if any: Continues to deal with vocal hoarseness. Reports frequent coughing fits to bring up thick phlegm (which is occasionally blood tinged). Skin appears mildly red, but is intact and seems to be healing well (confirms he continues to apply sonafine as directed). Reports upper back pain that has been present for about 2 weeks; states it flares when he sits (will resolve with heat) or stands for an extended period of time

## 2022-12-02 NOTE — Progress Notes (Signed)
Dr. Basilio Cairo requested additional IVF appointments for patient after his last appointment next week on 11/27, as well as weekly BMP to be drawn.   High priority scheduling message sent requesting additional IVF appointments 3x a week, as well as a weekly lab visit.   Patient and wife aware of plan, and patient agreed to stop by lab on his way out today to have BMP drawn for his IVF appointment on Monday 11/25

## 2022-12-02 NOTE — Progress Notes (Addendum)
Radiation Oncology         [phone]) [phone] ________________________________  Name: Randall [name] MRN: [id number]  Date: 12/02/2022  DOB: 12-Apr-1955  Follow-Up Visit Note  CC: Barbie Banner, MD  Corey Skains, MD  Diagnosis and Prior Radiotherapy:       ICD-10-CM   1. 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 Pyriform sinus cancer (HCC)  C12 HYDROcodone-acetaminophen (HYCET) 7.5-325 mg/15 ml solution    fentaNYL (DURAGESIC) 25 MCG/HR      CHIEF COMPLAINT:  Here for follow-up and surveillance of throat cancer  Narrative:  The patient returns today for routine follow-up.  Randall [name] presents today for follow-up after completing radiation to his hypopharynx/supraglottis on 11/18/2022  Pain issues, if any: Continues to struggle with sore throat and burning sensation when he swallows Using a feeding tube?: N/A Weight changes, if any:  Wt Readings from Last 3 Encounters:  12/02/22 187 lb (84.8 kg)  11/30/22 187 lb 12 oz (85.2 kg)  09/21/22 195 lb 6 oz (88.6 kg)   Swallowing issues, if any: Yes--unable to swallow any solid food. Currently only able to tolerate yogurt and thin liquids.  He has about 4 Greek yogurts a day and about a quart of water every day Smoking or chewing tobacco? None Using fluoride toothpaste daily? Denies any mouth sores or dental concerns Last ENT visit was on: Not since diagnosis Other notable issues, if any: Continues to deal with vocal hoarseness. Reports frequent coughing fits to bring up thick phlegm (which is occasionally blood tinged). Skin appears mildly red, but is intact and seems to be healing well (confirms he continues to apply sonafine as directed). Reports upper back pain that has been present for about 2 weeks; states it flares when he sits (will resolve with heat) or stands for an extended period of  time-this may be related to recent yardwork.                      ALLERGIES:  is allergic to norgesic forte [orphenadrine-aspirin-caffeine] and latex.  Meds: Current Outpatient Medications  Medication Sig Dispense Refill   acetaminophen (TYLENOL) 500 MG tablet 1,000 mg every 6 (six) hours as needed for moderate pain.     amLODipine (NORVASC) 10 MG tablet Take 10 mg by mouth daily.     aspirin EC 81 MG tablet Take 81 mg by mouth daily. Swallow whole.     atorvastatin (LIPITOR) 20 MG tablet Take 20 mg by mouth daily.     carvedilol (COREG) 25 MG tablet Take 25 mg by mouth 2 (two) times daily with a meal.     cetirizine (ZYRTEC) 10 MG tablet Take 10 mg by mouth daily as needed for allergies. 24 hour     chlorthalidone (HYGROTON) 25 MG tablet TAKE 1/2 TABLET BY MOUTH DAILY 45 tablet 3   fentaNYL (DURAGESIC) 25 MCG/HR Place 1 patch onto the skin every 3 (three) days. 5 patch 0   glimepiride (AMARYL) 4 MG tablet Take 2 mg by mouth daily with breakfast.     glucose blood (FREESTYLE LITE) test strip Test glucose BID   Diagnoses Code: E11.9     HYDROcodone-acetaminophen (HYCET) 7.5-325 mg/15 ml solution Take 10-15 mL every three hours as needed for pain 600 mL 0   LORazepam (ATIVAN) 0.5 MG tablet Take  1 tablet (0.5 mg total) by mouth every 8 (eight) hours. Take as needed for aversion to feedings. 30 tablet 0   meloxicam (MOBIC) 15 MG tablet Take 15 mg by mouth daily.     metFORMIN (GLUCOPHAGE-XR) 500 MG 24 hr tablet Take 1,000 mg by mouth 2 (two) times daily.     ondansetron (ZOFRAN) 8 MG tablet Take 1 tablet (8 mg total) by mouth every 8 (eight) hours as needed for nausea or vomiting. 20 tablet 2   OVER THE COUNTER MEDICATION Take 2 capsules by mouth daily. xEO Mega Complex     OVER THE COUNTER MEDICATION Take 2 capsules by mouth daily. Alpha crs plus supplement     OVER THE COUNTER MEDICATION Take 2 capsules by mouth daily. Microplex vmz supplement     pantoprazole (PROTONIX) 40 MG tablet Take 40  mg by mouth daily.     potassium citrate (UROCIT-K) 10 MEQ (1080 MG) SR tablet Take 10 mEq by mouth 2 (two) times daily.     sodium fluoride (FLUORISHIELD) 1.1 % GEL dental gel Place 1 Application onto teeth at bedtime.     tadalafil (CIALIS) 20 MG tablet Take 20 mg by mouth daily as needed for erectile dysfunction.     telmisartan (MICARDIS) 80 MG tablet Take 80 mg by mouth daily.     triamcinolone cream (KENALOG) 0.1 % Apply 1 application topically 3 (three) times daily as needed for itching (rash).     TRULICITY 1.5 MG/0.5ML SOPN Inject 1.5 mg as directed once a week. Thursday morning     Current Facility-Administered Medications  Medication Dose Route Frequency Provider Last Rate Last Admin   Sonafine emulsion 1 Application  1 Application Topical BID Lonie Peak, MD   1 Application at 12/02/22 1216    Physical Findings: The patient is in no acute distress. Patient is alert and oriented. Wt Readings from Last 3 Encounters:  12/02/22 187 lb (84.8 kg)  11/30/22 187 lb 12 oz (85.2 kg)  09/21/22 195 lb 6 oz (88.6 kg)    weight is 187 lb (84.8 kg). His temperature is 97.5 F (36.4 C) (abnormal). His blood pressure is 104/61 and his pulse is 71. His respiration is 18 and oxygen saturation is 98%. .  General: Alert and oriented, in no acute distress HEENT: Head is normocephalic. Extraocular movements are intact. Oropharynx is notable for healing mucosa, no lesions Neck: Neck is notable for no palpable masses. Skin: Skin in treatment fields shows satisfactory healing over neck. Chest: Normal respiratory effort. Abdomen: Soft, nontender, nondistended, with no rigidity or guarding.  No feeding tube. Extremities: No cyanosis or edema. Lymphatics: see Neck Exam Psychiatric: Judgment and insight are intact. Affect is appropriate.   Lab Findings: Lab Results  Component Value Date   WBC 8.5 06/17/2022   HGB 15.3 06/17/2022   HCT 43.6 06/17/2022   MCV 91.8 06/17/2022   PLT 187 06/17/2022     Lab Results  Component Value Date   TSH 2.038 09/30/2022    Radiographic Findings: No results found.  Impression/Plan:    1) Head and Neck Cancer Status: Healing from radiation therapy.  Still dealing with pain.  Refill for fentanyl and Hycet made today.  He will try to stop the fentanyl in about a week and see how he does with that.  He is unable to tolerate anything orally except water and [place] yogurt.  We talked extensively about other options but he is not confident that they will be feasible.  Advised to try to double the amount of [place] yogurt he has every day, more if able.  He will also take a multivitamin every day.  Continue IV fluids for 3 weeks for additional support.  2) Nutritional Status: As above PEG tube: None  3) Risk Factors: The patient has been educated about risk factors including alcohol and tobacco abuse; they understand that avoidance of alcohol and tobacco is important to prevent recurrences as well as other cancers  4) Swallowing: Functional with [place] yogurt and water.  Continue swallowing exercises.  5) Dental: Encouraged to continue regular followup with dentistry, and dental hygiene including fluoride rinses.    6) Thyroid function: Check annually Lab Results  Component Value Date   TSH 2.038 09/30/2022    7) he will see me for follow-up in 3 weeks for further support and monitoring.. The patient was encouraged to call with any issues or questions before then.  On date of service, in total, I spent 20 minutes on this encounter. Patient was seen in person. _____________________________________   Lonie Peak, MD

## 2022-12-05 ENCOUNTER — Inpatient Hospital Stay: Payer: Medicare HMO

## 2022-12-05 VITALS — BP 110/56 | HR 72 | Resp 18

## 2022-12-05 DIAGNOSIS — C139 Malignant neoplasm of hypopharynx, unspecified: Secondary | ICD-10-CM | POA: Diagnosis not present

## 2022-12-05 DIAGNOSIS — C12 Malignant neoplasm of pyriform sinus: Secondary | ICD-10-CM

## 2022-12-05 MED ORDER — SODIUM CHLORIDE 0.9 % IV SOLN
Freq: Once | INTRAVENOUS | Status: AC
Start: 2022-12-05 — End: 2022-12-05

## 2022-12-05 NOTE — Progress Notes (Signed)
Nutrition Follow-up:  Patient with SCC of piriform sinus, p 16+.  Patient completed radiation on 11/8.   Met with patient during IV fluids. Has been out of pain medication for the last 2 days. Able to get a refill today.  Has not been eating much over the weekend and having issues with nausea.  Has drank 24 oz of water today. Has not been able to get yogurt down over the weekend.  Unable to get down liquid IV or pedialyte.      Medications: reviewed  Labs: reviewed  Anthropometrics:   Weight 187 lb on 11/22 194 lb 4 oz on 11/4 per [facility] 208 lb on 10/21 205 lb on 10/7  8% weight loss in the last month and 3 weeks, significant   NUTRITION DIAGNOSIS: Inadequate oral intake continues    INTERVENTION:  Wife asking about labs being drawn today per Dr Basilio Cairo.  Will send message to Dr Basilio Cairo and team. Continue pain medication and antinausea Consider feeding tube placement with limited intake, significant weight loss.  Patient has declined feeding tube in the past.     MONITORING, EVALUATION, GOAL: weight trends, intake   NEXT VISIT: to be determined  Camile Esters B. Freida Busman, RD, LDN Registered Dietitian [phone]

## 2022-12-05 NOTE — Patient Instructions (Signed)

## 2022-12-06 ENCOUNTER — Telehealth: Payer: Self-pay | Admitting: *Deleted

## 2022-12-06 ENCOUNTER — Telehealth: Payer: Self-pay

## 2022-12-06 ENCOUNTER — Ambulatory Visit
Admission: RE | Admit: 2022-12-06 | Discharge: 2022-12-06 | Disposition: A | Discharge status: Home / Self Care | Payer: Medicare HMO | Source: Ambulatory Visit | Attending: Radiation Oncology | Admitting: Radiation Oncology

## 2022-12-06 ENCOUNTER — Other Ambulatory Visit: Payer: Self-pay

## 2022-12-06 ENCOUNTER — Encounter: Payer: Self-pay | Admitting: Radiation Oncology

## 2022-12-06 ENCOUNTER — Inpatient Hospital Stay: Payer: Medicare HMO

## 2022-12-06 ENCOUNTER — Other Ambulatory Visit: Payer: Self-pay | Admitting: Radiation Oncology

## 2022-12-06 VITALS — BP 102/55 | HR 58 | Temp 98.5°F | Resp 18 | Wt 186.0 lb

## 2022-12-06 DIAGNOSIS — C139 Malignant neoplasm of hypopharynx, unspecified: Secondary | ICD-10-CM | POA: Diagnosis not present

## 2022-12-06 DIAGNOSIS — C12 Malignant neoplasm of pyriform sinus: Secondary | ICD-10-CM

## 2022-12-06 LAB — BASIC METABOLIC PANEL - CANCER CENTER ONLY
Anion gap: 8 (ref 5–15)
BUN: 52 mg/dL — ABNORMAL HIGH (ref 8–23)
CO2: 28 mmol/L (ref 22–32)
Calcium: 9.2 mg/dL (ref 8.9–10.3)
Chloride: 107 mmol/L (ref 98–111)
Creatinine: 1.97 mg/dL — ABNORMAL HIGH (ref 0.61–1.24)
GFR, Estimated: 37 mL/min — ABNORMAL LOW (ref >60)
Glucose, Bld: 136 mg/dL — ABNORMAL HIGH (ref 70–99)
Potassium: 3.9 mmol/L (ref 3.5–5.1)
Sodium: 143 mmol/L (ref 135–145)

## 2022-12-06 MED ORDER — SODIUM CHLORIDE 0.9 % IV SOLN
Freq: Once | INTRAVENOUS | Status: AC
Start: 2022-12-06 — End: 2022-12-06

## 2022-12-06 MED ORDER — HYDROCODONE-ACETAMINOPHEN 7.5-325 MG/15ML PO SOLN: ORAL | 0 refills | Status: DC

## 2022-12-06 NOTE — Telephone Encounter (Signed)
CALLED PATIENT TO INFORM OF LAB APPT. TODAY @ 1:45 PM, SPOKE WITH PATIENT AND HE IS AWARE OF THIS APPT.

## 2022-12-06 NOTE — Progress Notes (Signed)
I noted today that his BUN and Cr are trending up.  I just called him to give advice on pain control, PO intake, etc. He will need more IV fluids this week.  He is getting an infusion now. Given holiday schedule, I know infusion space is limited, but I asked nursing to schedule him for two more infusions this week.   Requests for more IVF this week:  Wed + Fri OR Wed + Sat OR Fri + Sat.  And then I recommend we resume M,W,F IV fluids with BMPs on Mondays next week.  Mr Baumgardner states he feels better today and will work on escalating po intake at home. He is adamant to avoid feed tube.  -----------------------------------  Lonie Peak, MD

## 2022-12-06 NOTE — Patient Instructions (Signed)

## 2022-12-07 ENCOUNTER — Inpatient Hospital Stay: Payer: Medicare HMO

## 2022-12-07 ENCOUNTER — Telehealth: Payer: Self-pay | Admitting: Radiation Oncology

## 2022-12-07 ENCOUNTER — Encounter: Payer: Self-pay | Admitting: Radiation Oncology

## 2022-12-07 VITALS — BP 105/59 | HR 67 | Temp 98.4°F | Resp 17

## 2022-12-07 DIAGNOSIS — C139 Malignant neoplasm of hypopharynx, unspecified: Secondary | ICD-10-CM | POA: Diagnosis not present

## 2022-12-07 DIAGNOSIS — C12 Malignant neoplasm of pyriform sinus: Secondary | ICD-10-CM

## 2022-12-07 MED ORDER — SODIUM CHLORIDE 0.9 % IV SOLN: Freq: Once | INTRAVENOUS | Status: AC

## 2022-12-07 NOTE — Patient Instructions (Signed)

## 2022-12-07 NOTE — Telephone Encounter (Signed)
Called patient to make aware of IVF appointments for Friday 12/09/22 @ 730am and Saturday 12/10/22 @ 10 am. Patient voiced understanding.

## 2022-12-07 NOTE — Telephone Encounter (Signed)
Called and left VM with appointment details with IVF. Before leaving a VM, the patient was contacted and asked for a detailed message with upcoming appointments. Patient states that he will also check his mychart.

## 2022-12-09 ENCOUNTER — Inpatient Hospital Stay: Payer: Medicare HMO

## 2022-12-09 VITALS — BP 111/62 | HR 70 | Temp 98.3°F | Resp 18

## 2022-12-09 DIAGNOSIS — C12 Malignant neoplasm of pyriform sinus: Secondary | ICD-10-CM

## 2022-12-09 DIAGNOSIS — C139 Malignant neoplasm of hypopharynx, unspecified: Secondary | ICD-10-CM | POA: Diagnosis not present

## 2022-12-09 MED ORDER — SODIUM CHLORIDE 0.9 % IV SOLN
Freq: Once | INTRAVENOUS | Status: AC
Start: 2022-12-09 — End: 2022-12-09

## 2022-12-09 NOTE — Patient Instructions (Signed)

## 2022-12-10 ENCOUNTER — Inpatient Hospital Stay: Payer: Medicare HMO

## 2022-12-10 VITALS — BP 110/67 | HR 68 | Temp 99.1°F | Resp 18

## 2022-12-10 DIAGNOSIS — C139 Malignant neoplasm of hypopharynx, unspecified: Secondary | ICD-10-CM | POA: Diagnosis not present

## 2022-12-10 DIAGNOSIS — C12 Malignant neoplasm of pyriform sinus: Secondary | ICD-10-CM

## 2022-12-10 MED ORDER — SODIUM CHLORIDE 0.9 % IV SOLN: Freq: Once | INTRAVENOUS | Status: AC

## 2022-12-10 NOTE — Patient Instructions (Signed)

## 2022-12-12 ENCOUNTER — Ambulatory Visit: Payer: Medicare HMO

## 2022-12-12 ENCOUNTER — Other Ambulatory Visit: Payer: Self-pay | Admitting: Surgery

## 2022-12-13 ENCOUNTER — Other Ambulatory Visit: Payer: Self-pay | Admitting: Emergency Medicine

## 2022-12-13 ENCOUNTER — Ambulatory Visit
Admission: RE | Admit: 2022-12-13 | Discharge: 2022-12-13 | Disposition: A | Discharge status: Home / Self Care | Payer: Medicare HMO | Source: Ambulatory Visit | Attending: Radiation Oncology | Admitting: Radiation Oncology

## 2022-12-13 ENCOUNTER — Other Ambulatory Visit: Payer: Self-pay | Admitting: *Deleted

## 2022-12-13 ENCOUNTER — Encounter: Payer: Self-pay | Admitting: Emergency Medicine

## 2022-12-13 ENCOUNTER — Inpatient Hospital Stay: Payer: Medicare HMO | Attending: Radiation Oncology

## 2022-12-13 ENCOUNTER — Other Ambulatory Visit: Payer: Self-pay | Admitting: Radiology

## 2022-12-13 ENCOUNTER — Inpatient Hospital Stay: Payer: Medicare HMO | Admitting: Dietician

## 2022-12-13 ENCOUNTER — Other Ambulatory Visit: Payer: Self-pay

## 2022-12-13 VITALS — BP 120/57 | HR 70 | Temp 98.4°F | Resp 18 | Wt 184.3 lb

## 2022-12-13 DIAGNOSIS — C12 Malignant neoplasm of pyriform sinus: Secondary | ICD-10-CM | POA: Insufficient documentation

## 2022-12-13 DIAGNOSIS — E119 Type 2 diabetes mellitus without complications: Secondary | ICD-10-CM | POA: Insufficient documentation

## 2022-12-13 LAB — BASIC METABOLIC PANEL - CANCER CENTER ONLY
Anion gap: 8 (ref 5–15)
BUN: 15 mg/dL (ref 8–23)
CO2: 29 mmol/L (ref 22–32)
Calcium: 8.9 mg/dL (ref 8.9–10.3)
Chloride: 100 mmol/L (ref 98–111)
Creatinine: 1.51 mg/dL — ABNORMAL HIGH (ref 0.61–1.24)
GFR, Estimated: 51 mL/min — ABNORMAL LOW (ref >60)
Glucose, Bld: 120 mg/dL — ABNORMAL HIGH (ref 70–99)
Potassium: 3.5 mmol/L (ref 3.5–5.1)
Sodium: 137 mmol/L (ref 135–145)

## 2022-12-13 MED ORDER — SODIUM CHLORIDE 0.9 % IV SOLN: Freq: Once | INTRAVENOUS | Status: AC

## 2022-12-13 MED ORDER — ONDANSETRON HCL 8 MG PO TABS: 8.0000 mg | ORAL_TABLET | Freq: Three times a day (TID) | ORAL | 2 refills | Status: DC | PRN

## 2022-12-13 MED ORDER — HYDROCODONE-ACETAMINOPHEN 7.5-325 MG/15ML PO SOLN: ORAL | 0 refills | Status: DC

## 2022-12-13 NOTE — Progress Notes (Signed)
Nutrition Follow-up:  Patient with SCC of piriform sinus, p 16+. Patient completed radiation on 11/8. He is currently receiving supportive therapy with IV fluids.  Met with patient and wife in [facility] during IVF. Patient reports tolerating some liquids. He is drinking Ensure Clear mixed with apple juice and sprite as well as chicken noodle soup broth. Wife reports he consumed 77 ounces of fluids yesterday. Pt does not tolerate milk or milky supplements. Patient continues to have sore throat. The hycet helps take the edge off. Patient has ~1/4 of bottle left and unsure when he can have this filled again. Nausea is improving. Patient reports zofran odt works well for him and needs this filled as well. Thick saliva persists. He is doing baking soda salt water gargles. Patient does not want to try solids until saliva has calmed down.   Patient does have a healing abrasion on his forehead. He reports upper arms are bruised as well after a fall at home on Friday. Patient reports getting out of bed to go to the bathroom and lost control of arms/legs. He was shaky and unstable, landing head first into the carpet. Patient did not seek medical attention. Patient has been checking blood sugars at home. Reports fasting blood sugars have been as low as 50 mg/dl. Patient currently taking 2 metformin BID, glimepiride daily, Trulicity once weekly. He reports initial taste changes and loss of interest in foods when Trulicity was added (2022). Patient has been unable to see primary to discuss this as his PCP recently retired. Patient referred to another [facility] practice, however first available new patient appointment is not until April.      Medications: fentanyl, hycet (11/22)  Labs: glucose 120, Cr 1.51  Anthropometrics: Wt 184 lb 4.8 oz today  decreased  186 lb on 11/26 194 lb 4 oz on 11/4 208 lb on 10/21 205 lb on 10/7    NUTRITION DIAGNOSIS: Inadequate oral intake - slowly improving    INTERVENTION:   Continue pain medication and antiemetics as needed - message sent to Dr. Basilio Cairo regarding refill requests Per PCT, [facility] currently accepting new patients - this information relayed to pt Discussed hypoglycemia concerns with Dr. Basilio Cairo. Will check HgbA1c next lab draw Continue drinking Ensure Clear mixture. Suggested adding bone broth to chicken soup for added calories and protein as well as trying Fairlife Nutrition Shake (150 kcal, 30g)    MONITORING, EVALUATION, GOAL: wt trends, intake   NEXT VISIT: Monday December 9 with Drema Halon

## 2022-12-13 NOTE — Progress Notes (Signed)
We were informed that this patient due to a hypoglycemic episode. He states his blood sugars were in the 50-60s when he began "to not feel like himself". He is currently taking metformin BID, daily glimepiride, and Trulicity q7. We recommended that the patient stops taking Tulicity to avoid hypoglycemia for the time being with close monitoring of his blood sugar.   His PCP has retired, and his next scheduled appointment with his new PCP isn't until April. Patient is working to get an appointment sooner. If he is not able to get an appt within the month of December, I encouraged the patient to see if he can get an appointment with [facility] for help with DM management. Mr. Smyser is scheduled for follow-up with Dr. Basilio Cairo on 12/19/2022. We will obtain an A1c at that time.   Prescriptions for Hycet and Zofran were also refilled today. He will continue to receive IV fluids TID.     Joyice Faster, PA-C

## 2022-12-13 NOTE — Progress Notes (Signed)
Lab and IVF orders were place. For 12-9 thu 1-3  Rn notified Scheduler that provider want to schedule an appointment on Monday12/9  after the patient get fluids.. Orders were put in for 12-5 and 12-7 fluids . Writer also sent email to med onc to schedule the fluids . Scheduler was also asked to put in lab seats for those days.

## 2022-12-15 ENCOUNTER — Inpatient Hospital Stay: Payer: Medicare HMO

## 2022-12-15 VITALS — BP 104/59 | HR 62 | Temp 97.8°F | Resp 18

## 2022-12-15 DIAGNOSIS — C12 Malignant neoplasm of pyriform sinus: Secondary | ICD-10-CM

## 2022-12-15 MED ORDER — SODIUM CHLORIDE 0.9 % IV SOLN: Freq: Once | INTRAVENOUS | Status: AC

## 2022-12-15 NOTE — Patient Instructions (Signed)

## 2022-12-16 NOTE — Progress Notes (Signed)
Mr. Halberg presents today for follow-up after completing radiation to his hypopharynx/supraglottis on 11/18/2022  Pain issues, if any: Continues to deal with sore throat. Using a feeding tube?: N/A Weight changes, if any:  Wt Readings from Last 3 Encounters:  12/19/22 186 lb 9.6 oz (84.6 kg)  12/19/22 185 lb 1.9 oz (84 kg)  12/13/22 184 lb 4.8 oz (83.6 kg)   Swallowing issues, if any: Yes--continues to mainly consume liquids and supplements. He is occasionally trying more solid foods, and so far has been able to tolerate pinto beans and yogurt Smoking or chewing tobacco? None Using fluoride toothpaste daily? Yes--denies any dental concerns or mouth sores.  Last ENT visit was on: Not since diagnosis  Other notable issues, if any: Reports he is seeing his PCP this afternoon. Has two more sessions of supportive IVFs scheduled. Skin in treatment field appears intact and well healed.

## 2022-12-17 ENCOUNTER — Inpatient Hospital Stay: Payer: Medicare HMO

## 2022-12-17 VITALS — BP 99/52 | HR 72 | Temp 97.6°F | Resp 16

## 2022-12-17 DIAGNOSIS — C12 Malignant neoplasm of pyriform sinus: Secondary | ICD-10-CM | POA: Diagnosis not present

## 2022-12-17 MED ORDER — SODIUM CHLORIDE 0.9 % IV SOLN: Freq: Once | INTRAVENOUS | Status: AC

## 2022-12-17 NOTE — Patient Instructions (Signed)

## 2022-12-19 ENCOUNTER — Inpatient Hospital Stay: Payer: Medicare HMO

## 2022-12-19 ENCOUNTER — Ambulatory Visit
Admission: RE | Admit: 2022-12-19 | Discharge: 2022-12-19 | Disposition: A | Discharge status: Home / Self Care | Payer: Medicare HMO | Source: Ambulatory Visit | Attending: Radiation Oncology | Admitting: Radiation Oncology

## 2022-12-19 ENCOUNTER — Encounter: Payer: Self-pay | Admitting: Radiation Oncology

## 2022-12-19 VITALS — BP 117/63 | HR 61 | Temp 97.9°F | Resp 20 | Ht 68.0 in | Wt 186.6 lb

## 2022-12-19 VITALS — BP 103/51 | HR 61 | Temp 98.0°F | Resp 18 | Wt 185.1 lb

## 2022-12-19 DIAGNOSIS — Z7982 Long term (current) use of aspirin: Secondary | ICD-10-CM | POA: Insufficient documentation

## 2022-12-19 DIAGNOSIS — Z791 Long term (current) use of non-steroidal anti-inflammatories (NSAID): Secondary | ICD-10-CM | POA: Insufficient documentation

## 2022-12-19 DIAGNOSIS — Z79899 Other long term (current) drug therapy: Secondary | ICD-10-CM | POA: Insufficient documentation

## 2022-12-19 DIAGNOSIS — C12 Malignant neoplasm of pyriform sinus: Secondary | ICD-10-CM

## 2022-12-19 DIAGNOSIS — Z923 Personal history of irradiation: Secondary | ICD-10-CM | POA: Insufficient documentation

## 2022-12-19 DIAGNOSIS — Z7985 Long-term (current) use of injectable non-insulin antidiabetic drugs: Secondary | ICD-10-CM | POA: Insufficient documentation

## 2022-12-19 DIAGNOSIS — Z7984 Long term (current) use of oral hypoglycemic drugs: Secondary | ICD-10-CM | POA: Insufficient documentation

## 2022-12-19 LAB — HEMOGLOBIN A1C
Hgb A1c MFr Bld: 5.6 % (ref 4.8–5.6)
Mean Plasma Glucose: 114.02 mg/dL

## 2022-12-19 LAB — BASIC METABOLIC PANEL - CANCER CENTER ONLY
Anion gap: 8 (ref 5–15)
BUN: 15 mg/dL (ref 8–23)
CO2: 27 mmol/L (ref 22–32)
Calcium: 8.7 mg/dL — ABNORMAL LOW (ref 8.9–10.3)
Chloride: 99 mmol/L (ref 98–111)
Creatinine: 1.16 mg/dL (ref 0.61–1.24)
GFR, Estimated: >60 mL/min (ref >60)
Glucose, Bld: 166 mg/dL — ABNORMAL HIGH (ref 70–99)
Potassium: 3.7 mmol/L (ref 3.5–5.1)
Sodium: 134 mmol/L — ABNORMAL LOW (ref 135–145)

## 2022-12-19 MED ORDER — SODIUM CHLORIDE 0.9 % IV SOLN
Freq: Once | INTRAVENOUS | Status: AC
Start: 2022-12-19 — End: 2022-12-19

## 2022-12-19 NOTE — Patient Instructions (Signed)

## 2022-12-19 NOTE — Addendum Note (Signed)
Encounter addended by: Lonie Peak, MD on: 12/19/2022 11:14 AM  Actions taken: Clinical Note Signed

## 2022-12-19 NOTE — Progress Notes (Signed)
Radiation Oncology         [phone] ________________________________  Name: Randall [name] MRN: [id number]  Date: 12/19/2022  DOB: 1955/01/15  Follow-Up Visit Note  CC: Randall Banner, MD  Randall Banner, MD  Diagnosis and Prior Radiotherapy:       ICD-10-CM   1. Pyriform sinus cancer (HCC)  C12      Diagnosis: C12 Malignant neoplasm of pyriform sinus Staging on 2022-09-21: Pyriform sinus cancer (HCC) T=pT2, N=pN2a, M=cM0 Staging on 2022-07-04: Pyriform sinus cancer (HCC) T=cT2, N=cN2b, M=cM0 Intent: Curative     ==========DELIVERED PLANS==========  First Treatment Date: 2022-10-10 - Last Treatment Date: 2022-11-18   Plan Name: HN_Hypoph Site: Laryngopharynx Technique: IMRT Mode: Photon Dose Per Fraction: 2 Gy Prescribed Dose (Delivered / Prescribed): 60 Gy / 60 Gy Prescribed Fxs (Delivered / Prescribed): 30 / 30     CHIEF COMPLAINT:  Here for follow-up and surveillance of throat cancer  Narrative: Randall [name] presents today for follow-up after completing radiation to his hypopharynx/supraglottis on 11/18/2022 - doing better...  Pain issues, if any: Continues to deal with sore throat. Using a feeding tube?: N/A Weight changes, if any:  Wt Readings from Last 3 Encounters:  12/19/22 186 lb 9.6 oz (84.6 kg)  12/19/22 185 lb 1.9 oz (84 kg)  12/13/22 184 lb 4.8 oz (83.6 kg)   Swallowing issues, if any: Yes- better--continues to mainly consume liquids and supplements. He is occasionally trying more solid foods, and so far has been able to tolerate pinto beans and yogurt Smoking or chewing tobacco? None Using fluoride toothpaste daily? Yes--denies any dental concerns or mouth sores.  Last ENT visit was on: Not since diagnosis  Other notable issues, if any: Reports he is seeing his PCP this afternoon. Has two more sessions of supportive IVFs scheduled. Skin in treatment field appears intact and well healed.    ALLERGIES:  is allergic to norgesic forte  [orphenadrine-aspirin-caffeine] and latex.  Meds: Current Outpatient Medications  Medication Sig Dispense Refill   amLODipine (NORVASC) 10 MG tablet Take 10 mg by mouth daily.     aspirin EC 81 MG tablet Take 81 mg by mouth daily. Swallow whole.     atorvastatin (LIPITOR) 20 MG tablet Take 20 mg by mouth daily.     carvedilol (COREG) 25 MG tablet Take 25 mg by mouth 2 (two) times daily with a meal.     cetirizine (ZYRTEC) 10 MG tablet Take 10 mg by mouth daily as needed for allergies. 24 hour     chlorthalidone (HYGROTON) 25 MG tablet TAKE 1/2 TABLET BY MOUTH DAILY 45 tablet 3   fentaNYL (DURAGESIC) 25 MCG/HR Place 1 patch onto the skin every 3 (three) days. 5 patch 0   glimepiride (AMARYL) 4 MG tablet Take 2 mg by mouth daily with breakfast.     glucose blood (FREESTYLE LITE) test strip Test glucose BID   Diagnoses Code: E11.9     HYDROcodone-acetaminophen (HYCET) 7.5-325 mg/15 ml solution Take 10-15 mL every three hours as needed for pain 600 mL 0   LORazepam (ATIVAN) 0.5 MG tablet Take 1 tablet (0.5 mg total) by mouth every 8 (eight) hours. Take as needed for aversion to feedings. 30 tablet 0   meloxicam (MOBIC) 15 MG tablet Take 15 mg by mouth daily.     metFORMIN (GLUCOPHAGE-XR) 500 MG 24 hr tablet Take 1,000 mg by mouth 2 (two) times daily.     ondansetron (ZOFRAN) 8 MG tablet Take 1 tablet (8 mg  total) by mouth every 8 (eight) hours as needed for nausea or vomiting. 20 tablet 2   OVER THE COUNTER MEDICATION Take 2 capsules by mouth daily. xEO Mega Complex     OVER THE COUNTER MEDICATION Take 2 capsules by mouth daily. Alpha crs plus supplement     OVER THE COUNTER MEDICATION Take 2 capsules by mouth daily. Microplex vmz supplement     pantoprazole (PROTONIX) 40 MG tablet Take 40 mg by mouth daily.     potassium citrate (UROCIT-K) 10 MEQ (1080 MG) SR tablet Take 10 mEq by mouth 2 (two) times daily.     sodium fluoride (FLUORISHIELD) 1.1 % GEL dental gel Place 1 Application onto teeth at  bedtime.     tadalafil (CIALIS) 20 MG tablet Take 20 mg by mouth daily as needed for erectile dysfunction.     telmisartan (MICARDIS) 80 MG tablet Take 80 mg by mouth daily.     triamcinolone cream (KENALOG) 0.1 % Apply 1 application topically 3 (three) times daily as needed for itching (rash).     TRULICITY 1.5 MG/0.5ML SOPN Inject 1.5 mg as directed once a week. Thursday morning     No current facility-administered medications for this encounter.    Physical Findings: The patient is in no acute distress. Patient is alert and oriented. Wt Readings from Last 3 Encounters:  12/19/22 186 lb 9.6 oz (84.6 kg)  12/19/22 185 lb 1.9 oz (84 kg)  12/13/22 184 lb 4.8 oz (83.6 kg)    height is 5\' 8"  (1.727 m) and weight is 186 lb 9.6 oz (84.6 kg). His temperature is 97.9 F (36.6 C). His blood pressure is 117/63 and his pulse is 61. His respiration is 20 and oxygen saturation is 100%. .  General: Alert and oriented, in no acute distress HEENT: Head is normocephalic. Extraocular movements are intact. Oropharynx clear Neck: Neck is notable for no palpable masses. Skin: Skin in treatment fields shows satisfactory healing over neck. Chest: Normal respiratory effort. Abdomen: Soft, nontender, nondistended, with no rigidity or guarding.  No feeding tube. Extremities: No cyanosis or edema. Lymphatics: see Neck Exam Psychiatric: Judgment and insight are intact. Affect is appropriate.   Lab Findings: Lab Results  Component Value Date   WBC 8.5 06/17/2022   HGB 15.3 06/17/2022   HCT 43.6 06/17/2022   MCV 91.8 06/17/2022   PLT 187 06/17/2022    Lab Results  Component Value Date   TSH 2.038 09/30/2022   CMP     Component Value Date/Time   NA 134 (L) 12/19/2022 0750   NA 141 02/02/2021 1050   K 3.7 12/19/2022 0750   CL 99 12/19/2022 0750   CO2 27 12/19/2022 0750   GLUCOSE 166 (H) 12/19/2022 0750   BUN 15 12/19/2022 0750   BUN 12 02/02/2021 1050   CREATININE 1.16 12/19/2022 0750    CALCIUM 8.7 (L) 12/19/2022 0750   PROT 6.0 03/07/2010 0515   ALBUMIN 2.2 (L) 03/07/2010 0515   AST 23 03/07/2010 0515   ALT 34 03/07/2010 0515   ALKPHOS 83 03/07/2010 0515   BILITOT 1.1 03/07/2010 0515   EGFR 91 02/02/2021 1050   GFRNONAA >60 12/19/2022 0750    Radiographic Findings: No results found.  Impression/Plan:    1) Head and Neck Cancer Status: Healing from radiation therapy.  Intake /nutrition improving and weight stabilizing.   2) Nutritional Status: As above PEG tube: None Wt Readings from Last 3 Encounters:  12/19/22 186 lb 9.6 oz (84.6 kg)  12/19/22 185 lb 1.9 oz (84 kg)  12/13/22 184 lb 4.8 oz (83.6 kg)    Will continue IV fluids for the week per pt request  - then stop  3) Risk Factors: The patient has been educated about risk factors including alcohol and tobacco abuse; they understand that avoidance of alcohol and tobacco is important to prevent recurrences as well as other cancers  4) Swallowing: Functional with very soft food, liquid.  Continue swallowing exercises.  5) Dental: Encouraged to continue regular followup with dentistry, and dental hygiene including fluoride rinses.    6) Thyroid function: Check annually Lab Results  Component Value Date   TSH 2.038 09/30/2022    7) He has been on Trulicity and not comfortable stopping until seeing PCP.  Did get a visit which will be in the next day.  Advised to modify his diabetes meds due to weight loss, low glucose at home  8)F/u in 2 mo with CT neck and chest. The patient was encouraged to call with any issues or questions before then.  On date of service, in total, I spent 30 minutes on this encounter. Patient was seen in person. _____________________________________   Lonie Peak, MD

## 2022-12-19 NOTE — Progress Notes (Signed)
Nutrition Follow-up:  Patient with SCC of piriform sinus, p 16+.  Patient completed radiation on 11/8.   Met with patient and wife during IV fluids.  Patient reports drinking 70-77 oz of fluids each day.  Drinking about 3 ensure clear shakes mixed with other liquids (gingerale).  Drinking apple juice.  Could not tolerate tea.  Tried eggs mixed with cheese, grits but only able to get in 1 bite due to taste.  Tried chipped beef gravy with biscuit and able to get in 1 bite.  Was able to eat some pintos (more than bite) and tolerate.  Purchased a Fairlife shake but did not try it yet.  Eating raspberry and black cherry yogurt.    Sees PCP this afternoon  Medications: reviewed  Labs: Na 134, glucose 166, BUN 16, creatinine 1.16, calcium 8.7  Anthropometrics:   Weight 185 lb 1.9 oz today  184 lb 4.8 oz on 12/3 186 lb on 11/26 194 lb 4 oz on 11/4 208 lb on 10/21 205 lb on 10/7    NUTRITION DIAGNOSIS: Inadequate oral intake -slowly improving    INTERVENTION:  Samples of boost breeze given to patient to try (higher in calories than ensure clear).  Continue trying bites of foods and different flavors     MONITORING, EVALUATION, GOAL: weight trends, intake   NEXT VISIT: to be determined   Omero Kowal B. Freida Busman, RD, LDN Registered Dietitian [phone]

## 2022-12-20 ENCOUNTER — Ambulatory Visit: Payer: Medicare HMO | Attending: Radiation Oncology

## 2022-12-20 ENCOUNTER — Ambulatory Visit: Payer: Medicare HMO

## 2022-12-20 DIAGNOSIS — R1310 Dysphagia, unspecified: Secondary | ICD-10-CM | POA: Insufficient documentation

## 2022-12-20 NOTE — Patient Instructions (Signed)
   Signs of Aspiration Pneumonia   Chest pain/tightness Fever (can be low grade) Cough  With foul-smelling phlegm (sputum) With sputum containing pus or blood With greenish sputum Fatigue  Shortness of breath  Wheezing   **IF YOU HAVE THESE SIGNS, CONTACT YOUR DOCTOR OR GO TO THE EMERGENCY DEPARTMENT OR URGENT CARE AS SOON AS POSSIBLE**     

## 2022-12-20 NOTE — Therapy (Signed)
OUTPATIENT SPEECH LANGUAGE PATHOLOGY ONCOLOGY TREATMENT   Patient Name: Randall [name] MRN: [id number] DOB:[birth year], 67 y.o., male Today's Date: 12/20/2022  PCP: Benedetto Goad, MD REFERRING PROVIDER: Lonie Peak, MD  END OF SESSION:  End of Session - 12/20/22 1742     Visit Number 3    Number of Visits 7    Date for SLP Re-Evaluation 01/11/23    SLP Start Time 1103    SLP Stop Time  1143    SLP Time Calculation (min) 40 min    Activity Tolerance Patient tolerated treatment well              Past Medical History:  Diagnosis Date   Complication of anesthesia    Diabetes mellitus without complication (HCC)    Elevated coronary artery calcium score 02/02/2021   GERD (gastroesophageal reflux disease)    History of kidney stones 2014   Hypertension    Kidney stone 06/17/2015   PONV (postoperative nausea and vomiting)    Rupture of right quadriceps tendon 09/21/2020   - s/p srugical repair   Tuberculosis 1980   Type 2 diabetes mellitus without complication, without long-term current use of insulin (HCC) 03/21/2020   Past Surgical History:  Procedure Laterality Date   KIDNEY SURGERY     KNEE SURGERY     LITHOTRIPSY     MICROLARYNGOSCOPY Bilateral 06/17/2022   Procedure: DIRECT LARYNGOSCOPY WITH BIOPSY;  Surgeon: Laren Boom, DO;  Location: [facility] OR;  Service: ENT;  Laterality: Bilateral;   QUADRICEPS TENDON REPAIR Right 09/25/2020   Procedure: REPAIR RIGHT QUADRICEP TENDON;  Surgeon: Nadara Mustard, MD;  Location: [facility] OR;  Service: Orthopedics;  Laterality: Right;   TONSILLECTOMY     Patient Active Problem List   Diagnosis Date Noted   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   Hyperlipidemia LDL goal <70 02/21/2022   Elevated coronary artery calcium score 01/26/2021   Thoracic aortic atherosclerosis (HCC) 01/26/2021   Decreased left ventricular systolic function 12/25/2020   Resistant hypertension 11/13/2020   Right bundle  branch block 11/13/2020   Hypertensive heart disease with chronic diastolic congestive heart failure (HCC) 11/13/2020   Quadriceps tendon rupture, right, sequela     ONSET DATE: January 2024   REFERRING DIAG: Pyriform Sinus Cancer  THERAPY DIAG:  Dysphagia, unspecified type  Rationale for Evaluation and Treatment: Rehabilitation  SUBJECTIVE:   SUBJECTIVE STATEMENT: "I'm getting a lot of fluids now, but no food. 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 Hx: He presented with bilateral neck swelling in Jan 2024. He was also diagnosed with COVID around that time and he attributed his symptoms to that. The right neck swelling continued and he presented to his PCP in April 2024 for further evaluation. 05/05/22 A soft tissue neck US demonstrated a nonspecific 3.2 x 1.5 x 2.5 cm hypoechoic lesion in the right neck, correlating with the palpable site of concern. 05/25/22 CT neck demonstrated a soft tissue mass centered within the right piriform sinus,  measuring up to 22 x 8 mm, concerning for malignancy. CT also demonstrated a right level 3 lymph node measuring up to 2.8 x 1.8 cm, with central necrosis (correlating with the hypoechoic mass seen on neck ultrasound) suspicious for metastatic disease, and indeterminate asymmetric prominence of right level 2A  lymph node, measuring up to 16 x 10 mm   06/14/22 He saw Dr. Marene Lenz. Laryngoscopy performed during this visit revealed a mass lesion of the right piriform sinus which appeared to extend in to the arytenoid and partially onto the area epiglottic fold. 06/17/22 He underwent direct laryngoscopy to obtain biopsies. Biopsy of the right pyriform sinus showed findings consistent with invasive poorly differentiated squamous cell carcinoma (nonkeratinizing). A post cricoid biopsy was also  collected and showed no evidence of malignancy. 06/28/22 He met with Dr. Hezzie Bump to discuss treatment options- chemo/radiation vs surgery with possible post op radiation.07/04/22 Consult with Dr. Basilio Cairo. 07/07/22 PET demonstrated the right piriform sinus primary measuring 1.4 cm with an SUV max of 5.9, and the known level 3 ipsilateral nodal metastasis measuring 2.7 cm with an SUV max of 3.2. PET otherwise showed no evidence of extracervical hypermetabolic metastatic disease, and a nonspecific 2 mm LLL pulmonary nodule. He met with Dr. Al Pimple on 6/28 to discuss the option of chemotherapy but ultimately decided to proceed with surgery. 08/26/22 endoscopic CO2 laser excision of the hypopharyngeal mass and bilateral neck dissection by Dr. Hezzie Bump. Based on final pathology showing no evidence of extra nodal extension in the positive excised lymph node chemotherapy is not recommended only adjuvant radiation. 09/21/22 Follow up with Dr. Basilio Cairo. He will receive post operative radiation only. Treatment plan:  He will receive 35 fractions of radiation to his Hypopharynx and bilateral neck.  He started on 9/30 and he will complete 11/8  PAIN:  Are you having pain? Yes: NPRS scale: 6/10 Pain location: throat  Pain description: constant hurt Aggravating factors: acidic solids, talking Relieving factors: meds  FALLS: Has patient fallen in last 6 months?  No  PATIENT GOALS: "Do everything I can"  OBJECTIVE:  Note: Objective measures were completed at Evaluation unless otherwise noted.   TODAY'S TREATMENT:                                                                                                                                         DATE:   12/20/22:  Pt ate black cherry and raspberry yogurt this week. With POs today pt ate applesauce and drank water without any overt s/sx oral or pharyngeal dysphagia.  SLP provided pt with overt s/sx aspiration PNA and pt returned telling SLP 3 of these.  With HEP, pt's  frequency has decreased due to pain. Procedure today was WNL; SLP strongly encouraged pt to incr reps to 20/day for each (except Shaker),  no less than 5 reps at a time.  11/17/22: Pt has been performing HEP as well as he can - is holding to scope of HEP as directed with reduced reps. He performed one rep of each exercise for SLP with independence.  He told SLP rationale for HEP with independence. SLP told pt and wife about food journal and pt told SLP benefits of this.  10/13/22 (eval): Research states the risk for dysphagia increases due to radiation and/or chemotherapy treatment due to a variety of factors, so SLP educated the pt about the possibility of reduced/limited ability for PO intake during rad tx. SLP also educated pt regarding possible changes to swallowing musculature after rad tx, and why adherence to dysphagia HEP provided today and PO consumption was necessary to inhibit muscle fibrosis following rad tx and to mitigate muscle disuse atrophy. SLP informed pt why this would be detrimental to their swallowing status and to their pulmonary health. Pt demonstrated understanding of these things to SLP. SLP encouraged pt to safely eat and drink as deep into their radiation/chemotherapy as possible to provide the best possible long-term swallowing outcome for pt.  SLP then developed an individualized HEP for pt involving oral and pharyngeal strengthening and ROM and pt was instructed how to perform these exercises, including SLP demonstration. After SLP demonstration, pt return demonstrated each exercise. SLP ensured pt performance was correct prior to educating pt on next exercise. Pt required min cues faded to modified independent to perform HEP. Pt was instructed to complete this program 6-7 days/week, at least 2 times a day until 6 months after his or her last day of rad tx, and then x2 a week after that, indefinitely. Among other modifications for days when pt cannot functionally swallow, SLP also  suggested pt to perform only non-swallowing tasks on the handout/HEP, and if necessary to cycle through the swallowing portion so the full program of exercises can be completed instead of fatiguing on one of the swallowing exercises and being unable to perform the other swallowing exercises. SLP instructed that swallowing exercises should then be added back into the regimen as pt is able to do so.   PATIENT EDUCATION: Education details: see "today's treatment" Person educated: Patient Education method: See "today's treatment" Education comprehension: see "Today's treatment"  ASSESSMENT:  CLINICAL IMPRESSION: Patient is a 67 y.o. M who was seen today for treatment of swallowing as they undergo radiation/chemoradiation therapy. Today pt ate applesauce, and drank thin liquids without overt s/s oral or pharyngeal difficulty. At this time pt swallowing is deemed WNL/WFL with these POs. There are no overt s/s aspiration PNA observed by SLP nor any reported by pt at this time. Data indicate that pt's swallow ability will likely decrease over the course of radiation/chemoradiation therapy and could very well decline over time following the conclusion of that therapy due to muscle disuse atrophy and/or muscle fibrosis. Pt will cont to need to be seen by SLP in order to assess safety of PO intake, assess the need for recommending any objective swallow assessment, and ensuring pt is correctly completing the individualized HEP.   OBJECTIVE IMPAIRMENTS: include voice disorder and dysphagia. These impairments are limiting patient from effectively communicating at home and in community and safety when swallowing. Factors affecting potential to achieve goals and functional outcome are  none noted . Patient will benefit from skilled SLP services to address above impairments and improve overall function.   REHAB POTENTIAL: Good   GOALS: Goals reviewed with patient? No   SHORT TERM GOALS: Target: 3rd total  session   1. Pt will complete HEP with modified independence in 2 sessions Baseline: 11/17/22 Goal status: met   2.  pt will tell SLP why pt is completing HEP with modified independence Baseline:  Goal status: met   3.  pt will describe 3 overt s/s aspiration PNA with modified independence Baseline:  Goal status: met  4.  pt will tell SLP how a food journal could hasten return to a more normalized diet Baseline:  Goal status: met     LONG TERM GOALS: Target: 7th total session   1.  pt will complete HEP with independence over two visits Baseline: 11//7/24 Goal status: INITIAL   2.  pt will describe how to modify HEP over time, and the timeline associated with reduction in HEP frequency with modified independence over two sessions Baseline:  Goal status: INITIAL     PLAN:   SLP FREQUENCY:  once approx every 4 weeks   SLP DURATION:  7 sessions   PLANNED INTERVENTIONS: Aspiration precaution training, Pharyngeal strengthening exercises, Diet toleration management , Trials of upgraded texture/liquids, SLP instruction and feedback, Compensatory strategies, and Patient/family education    [facility], CCC-SLP 12/20/2022, 5:42 PM

## 2022-12-21 ENCOUNTER — Encounter: Payer: Self-pay | Admitting: Radiation Oncology

## 2022-12-21 ENCOUNTER — Inpatient Hospital Stay: Payer: Medicare HMO

## 2022-12-21 VITALS — BP 128/59 | HR 59 | Temp 98.2°F | Resp 16 | Ht 68.0 in | Wt 184.1 lb

## 2022-12-21 DIAGNOSIS — C12 Malignant neoplasm of pyriform sinus: Secondary | ICD-10-CM

## 2022-12-21 MED ORDER — SODIUM CHLORIDE 0.9 % IV SOLN: Freq: Once | INTRAVENOUS | Status: AC

## 2022-12-21 NOTE — Patient Instructions (Signed)

## 2022-12-22 ENCOUNTER — Ambulatory Visit: Payer: Medicare HMO

## 2022-12-22 ENCOUNTER — Other Ambulatory Visit: Payer: Self-pay | Admitting: Radiology

## 2022-12-22 DIAGNOSIS — C12 Malignant neoplasm of pyriform sinus: Secondary | ICD-10-CM

## 2022-12-22 MED ORDER — LORAZEPAM 0.5 MG PO TABS: 0.5000 mg | ORAL_TABLET | Freq: Three times a day (TID) | ORAL | 0 refills | Status: DC

## 2022-12-22 MED ORDER — HYDROCODONE-ACETAMINOPHEN 7.5-325 MG/15ML PO SOLN: ORAL | 0 refills | Status: DC

## 2022-12-22 MED ORDER — ONDANSETRON HCL 8 MG PO TABS: 8.0000 mg | ORAL_TABLET | Freq: Three times a day (TID) | ORAL | 2 refills | Status: DC | PRN

## 2022-12-22 MED ORDER — FENTANYL 25 MCG/HR TD PT72: 1.0000 | MEDICATED_PATCH | TRANSDERMAL | 0 refills | Status: DC

## 2022-12-22 NOTE — Progress Notes (Signed)
I called the patient to discuss his worsening bilateral lower extremity edema. I shared that while radiation can cause locoregional side effects to the area we treat, it does not have a systemic impact. He is receiving fluids which could be contributing to the swelling. He is scheduled for his last session of supportive IVF tomorrow. After further discussion with the patient, we have decided to cancel his IVF tomorrow, as that could worsen the swelling. I recommend that he follow-up with his PCP for management of this edema, as it is not a side effect of radiation therapy.  He is still experiencing significant pain to the treatment area and is requesting a refill of his Hycet and fentanyl patches. Patient also needs refills for Zofran and Ativan. Refills for these prescriptions have been sent to his local pharmacy today.     Joyice Faster, PA-C

## 2022-12-23 ENCOUNTER — Inpatient Hospital Stay: Payer: Medicare HMO

## 2022-12-24 ENCOUNTER — Ambulatory Visit: Payer: Medicare HMO

## 2022-12-27 ENCOUNTER — Ambulatory Visit: Payer: Medicare HMO

## 2022-12-27 ENCOUNTER — Ambulatory Visit: Payer: Self-pay | Admitting: Radiation Oncology

## 2022-12-29 ENCOUNTER — Ambulatory Visit: Payer: Medicare HMO

## 2022-12-31 ENCOUNTER — Ambulatory Visit: Payer: Medicare HMO

## 2023-01-02 ENCOUNTER — Ambulatory Visit: Payer: Medicare HMO

## 2023-01-02 NOTE — Progress Notes (Signed)
Nutrition Follow-up:  Patient with SCC of piriform sinus, p 16+.  Patient completed radiation on 11/8.  No feeding tube placed.  Spoke with patient via phone for nutrition follow-up.  Reports that he is eating some, drinking boost breeze (orange) shakes, [employer], premier protein carmel latte shakes.  Having issues with fluid bilateral legs and was given fluid pill and lost 11 lbs.  Not sleeping much, throat is still sore.      Medications: reviewed  Labs: reviewed  Anthropometrics:   Weight skewed by fluid  184 lb on 12/11 185 lb on 12/9 184 lb on 12/3 186 lb on 11/26 194 lb on 11/4 208 lb on 10/21 205 lb on 10/7   NUTRITION DIAGNOSIS: Inadequate oral intake slowly improving   MALNUTRITION DIAGNOSIS: Likely with extremely poor po intake   INTERVENTION:  Continue high calorie, high protein soft foods/liquids Patient had to take another call and ended call with RD.  Said that he would call back.    NEXT VISIT: as needed  Bentleigh Stankus B. Freida Busman, RD, LDN Registered Dietitian [phone]

## 2023-01-06 ENCOUNTER — Encounter: Payer: Self-pay | Admitting: Cardiology

## 2023-01-06 ENCOUNTER — Encounter: Payer: Self-pay | Admitting: Physical Therapy

## 2023-01-09 ENCOUNTER — Telehealth: Payer: Self-pay

## 2023-01-09 ENCOUNTER — Other Ambulatory Visit: Payer: Self-pay | Admitting: Radiology

## 2023-01-09 DIAGNOSIS — C12 Malignant neoplasm of pyriform sinus: Secondary | ICD-10-CM

## 2023-01-09 MED ORDER — HYDROCODONE-ACETAMINOPHEN 7.5-325 MG/15ML PO SOLN: ORAL | 0 refills | Status: DC

## 2023-01-09 NOTE — Telephone Encounter (Signed)
Patient called in to report having severe throat pain and bilateral leg swelling. Patient requesting refill on Hycet and Fentanyl patches. Requesting medication be sent to [facility].

## 2023-01-09 NOTE — Telephone Encounter (Signed)
We should probably have him come in in the next few weeks to see an APP to get a sense of his Sx & response to changes in regimen.  DH

## 2023-01-09 NOTE — Telephone Encounter (Signed)
Called pt to set up appointment. Appointment made for 1/2.

## 2023-01-10 ENCOUNTER — Encounter: Payer: Self-pay | Admitting: Radiation Oncology

## 2023-01-12 ENCOUNTER — Ambulatory Visit: Payer: Medicare HMO | Attending: Nurse Practitioner | Admitting: Emergency Medicine

## 2023-01-12 ENCOUNTER — Encounter: Payer: Self-pay | Admitting: Emergency Medicine

## 2023-01-12 VITALS — BP 132/74 | HR 68 | Ht 70.0 in | Wt 192.4 lb

## 2023-01-12 DIAGNOSIS — E785 Hyperlipidemia, unspecified: Secondary | ICD-10-CM | POA: Diagnosis not present

## 2023-01-12 DIAGNOSIS — Z136 Encounter for screening for cardiovascular disorders: Secondary | ICD-10-CM

## 2023-01-12 DIAGNOSIS — I2510 Atherosclerotic heart disease of native coronary artery without angina pectoris: Secondary | ICD-10-CM

## 2023-01-12 DIAGNOSIS — M7989 Other specified soft tissue disorders: Secondary | ICD-10-CM

## 2023-01-12 DIAGNOSIS — I4510 Unspecified right bundle-branch block: Secondary | ICD-10-CM

## 2023-01-12 DIAGNOSIS — I5022 Chronic systolic (congestive) heart failure: Secondary | ICD-10-CM

## 2023-01-12 DIAGNOSIS — I1A0 Resistant hypertension: Secondary | ICD-10-CM

## 2023-01-12 MED ORDER — FUROSEMIDE 40 MG PO TABS: 40.0000 mg | ORAL_TABLET | Freq: Every day | ORAL | 3 refills | Status: DC

## 2023-01-12 NOTE — Progress Notes (Signed)
 Cardiology Office Note:    Date:  01/12/2023  ID:  Randall [name], DOB 11-Feb-1955, MRN [id number] PCP: Tanda Prentice DEL, MD  [facility] Providers Cardiologist:  Alm Clay, MD       Patient Profile:     Randall [name] is a 68 year old male with visit pertinent history of resistant hypertension, chronic diastolic heart failure, HLD, elevated coronary artery calcium  score, thoracic aortic atherosclerosis, right bundle branch block, T2DM, laryngeal cancer.   He established care with cardiology service in November 2022 for abnormal EKG and hypertension management.  He was on 3 antihypertensive agents with persistently elevated blood pressures and his EKG showed right bundle branch block.  Echocardiogram was ordered and completed 12/08/2020 showing LVEF 45-50%, global hypokinesis, grade 1 DD, mild mitral valve regurgitation, mild calcification of the aortic valve.  Decreased LV function was thought to be in the setting of uncontrolled HTN.  Bundle branch block thought to be related to wall motion abnormality.  CT cardiac scoring completed on 02/02/2021 showing coronary calcium  score 683, 87 percentile for age, race, sex and over read interpretation CT chest showing aortic atherosclerosis.  Last seen in clinic on 02/18/2022 and was without any active cardiovascular symptoms.  His blood pressure was still borderline high and his chlorthalidone  was titrated up to 25 mg.  He was on combination of amlodipine  10 mg, carvedilol  25 mg twice daily, telmisartan 80 mg daily, chlorthalidone  25 mg daily.      History of Present Illness:  Discussed the use of AI scribe software for clinical note transcription with the patient, who gave verbal consent to proceed.  Randall [name] is a 68 y.o. male who returns for acute visit for lower extremity swelling.  He comes into the clinic today with his wife and states that over the past month he has experienced a significant increase in his chronic lower  extremity edema.  This increase in lower extremity swelling was gradual and he went to urgent care on 12/19/2022 and DVT was ruled out with negative bilateral lower extremity ultrasound and he was started on furosemide  20 mg 1/2-1 tab the morning and half tab in the evening.  He states that he had lost 11 pounds relatively quickly after he started the Lasix .  Of note his amlodipine  was discontinued earlier this year due to soft blood pressures.  However he does continue to stay active as he walks over a mile daily on his land without any significant decrease in his physical activity.  He denies any shortness of breath, DOE, exertional angina/CP, orthopnea, PND.  He does have bilateral calf pain at rest, on exertion, and to palpation.  He has had a eventful past year as he was diagnosed with laryngeal cancer and pyriform sinus cancer and had surgery to his neck and 30 doses of radiation.  He has finished his radiation treatment and did not have to receive chemotherapy and has a repeat CT scan in February to monitor.       Review of Systems  Constitutional: Negative for weight gain and weight loss.  Cardiovascular:  Positive for leg swelling. Negative for chest pain, claudication, dyspnea on exertion, irregular heartbeat, near-syncope, orthopnea, palpitations, paroxysmal nocturnal dyspnea and syncope.  Respiratory:  Negative for cough, hemoptysis and shortness of breath.   Gastrointestinal:  Negative for abdominal pain, hematochezia and melena.  Genitourinary:  Negative for hematuria.  Neurological:  Negative for dizziness and light-headedness.     See HPI    Studies Reviewed:  EKG Interpretation Date/Time:  Thursday January 12 2023 11:37:04 EST Ventricular Rate:  68 PR Interval:  234 QRS Duration:  162 QT Interval:  454 QTC Calculation: 482 R Axis:   -67  Text Interpretation: Sinus rhythm with 1st degree A-V block Left axis deviation Right bundle branch block Confirmed by Rana Dixon  [phone]) on 01/12/2023 1:20:41 PM    CT Cardiac Scoring 02/02/2021 IMPRESSION: Coronary calcium  score of 683. This was 55 percentile for age-, race-, and sex-matched controls.   Aortic atherosclerosis.  Echocardiogram 12/08/2020 1. Left ventricular ejection fraction, by estimation, is 45 to 50%. The  left ventricle has mildly decreased function. The left ventricle  demonstrates global hypokinesis. Left ventricular diastolic parameters are  consistent with Grade I diastolic  dysfunction (impaired relaxation).   2. Right ventricular systolic function is normal. The right ventricular  size is normal.   3. The mitral valve is normal in structure. Mild mitral valve  regurgitation. No evidence of mitral stenosis.   4. The aortic valve is normal in structure. There is mild calcification  of the aortic valve. There is mild thickening of the aortic valve. Aortic  valve regurgitation is not visualized. Aortic valve  sclerosis/calcification is present, without any  evidence of aortic stenosis.   5. The inferior vena cava is normal in size with greater than 50%  respiratory variability, suggesting right atrial pressure of 3 mmHg.  Risk Assessment/Calculations:             Physical Exam:   VS:  BP 132/74 (BP Location: Left Arm, Patient Position: Sitting, Cuff Size: Normal)   Pulse 68   Ht 5' 10 (1.778 m)   Wt 192 lb 6.4 oz (87.3 kg)   SpO2 99%   BMI 27.61 kg/m    Wt Readings from Last 3 Encounters:  01/12/23 192 lb 6.4 oz (87.3 kg)  12/21/22 184 lb 1.3 oz (83.5 kg)  12/19/22 186 lb 9.6 oz (84.6 kg)    Constitutional:      Appearance: Normal and healthy appearance.  Neck:     Vascular: JVD normal.  Pulmonary:     Effort: Pulmonary effort is normal.     Breath sounds: Normal breath sounds.  Chest:     Chest wall: Not tender to palpatation.  Cardiovascular:     PMI at left midclavicular line. Normal rate. Regular rhythm. Normal S1. Normal S2.      Murmurs: There is no murmur.      No gallop.  No click. No rub.  Pulses:    Intact distal pulses.  Edema:    Peripheral edema present.    Thigh: bilateral 3+ pitting edema of the thigh.    Ankle: bilateral 3+ pitting edema of the ankle.    Feet: bilateral 3+ pitting edema of the feet. Musculoskeletal:     Cervical back: Neck supple. Skin:    General: Skin is warm.  Neurological:     General: No focal deficit present.     Mental Status: Oriented to person, place and time.  Psychiatric:        Behavior: Behavior is cooperative.        Assessment and Plan:  HFmrEF / Leg swelling  -Echocardiogram 11/2020 showing LVEF 45-50%, global hypokinesis, grade 1 DD, mild mitral valve regurgitation. Thought to be related to his resistant HTN however echo did not show significantly increased LV thickness.  -He has gradual and ongoing 3+ bilateral lower extremity edema x1 month.  He is without any SOB, DOE,  orthopnea and has no limitation in his physical activity. He does have bilateral calf pain at rest, on exertion, and to palpation. He had lower extremity Dopplers on 12/30/22 that ruled out DVT and BNP was 79 on 12/19/2022.  He was started on Lasix  20 mg daily by urgent care on 12/19/2022 and has seen an 11 pound weight loss since. -Will plan for echocardiogram today with symptoms worrisome for worsening LV function.  Will increase his Lasix  dose to 40 mg once daily and he will continue his potassium at 20 mEq daily.  Repeat BMP x 1 week. Reviewed with pt regarding daily weight, daily compression stockings, and elevating legs.  -If worsening of LV function or he remains symptomatic plan for addition of SGLT2i versus spironolactone  on follow-up visit  Resistant hypertension -BP today 132/74.  Under good control at this time.  Of note his amlodipine  was recently discontinued due to hypotension.  Plan will be to continue current antihypertensive regimen consisting of carvedilol  25 mg twice daily, chlorthalidone  12.5 mg daily, telmisartan 80  mg daily.  Please monitor BP daily with blood pressure goal of less than 130/80  Coronary artery disease / hyperlipidemia -CT cardiac scoring 01/2021 with coronary calcium  score 683, this was a separate percentile for age, race, sex. Stable with no anginal symptoms, no indication for ischemic evaluation at this time. His last LDL was 138 on 09/2021 and currently managed by PCP. Not under good control and will need to be addressed at next visit and I recommended fasting lipid panel at that time. Continue aspirin  81 mg daily, atorvastatin  20mg  daily, carvedilol  25 mg twice daily, telmisartan 80 mg daily  Right bundle branch block  -EKG today showing 1st degree AV block with RBBB w/ HR 68bpm and unchanged from previous. Echo in 2022 with reduced EF and global hypokinesis as noted above. Could be related to bundle branch wall motion abnormality               Dispo:  Return in 1 month (on 02/12/2023).  Signed, Lum LITTIE Louis, NP

## 2023-01-12 NOTE — Patient Instructions (Signed)
 Medication Instructions:  Increase: Furosemide  (Lasix ) to 40 mg once daily *If you need a refill on your cardiac medications before your next appointment, please call your pharmacy*  Lab Work: BMET in one week (01/19/2023) after starting the new lasix  dose  If you have labs (blood work) drawn today and your tests are completely normal, you will receive your results only by: MyChart Message (if you have MyChart) OR A paper copy in the mail If you have any lab test that is abnormal or we need to change your treatment, we will call you to review the results.   Testing/Procedures: Your physician has requested that you have an echocardiogram. Echocardiography is a painless test that uses sound waves to create images of your heart. It provides your doctor with information about the size and shape of your heart and how well your heart's chambers and valves are working. This procedure takes approximately one hour. There are no restrictions for this procedure. Please do NOT wear cologne, perfume, aftershave, or lotions (deodorant is allowed). Please arrive 15 minutes prior to your appointment time. This will take place at [address] [facility] [place]    Follow-Up: At [facility], you and your health needs are our priority.  As part of our continuing mission to provide you with exceptional heart care, we have created designated Provider Care Teams.  These Care Teams include your primary Cardiologist (physician) and Advanced Practice Providers (APPs -  Physician Assistants and Nurse Practitioners) who all work together to provide you with the care you need, when you need it.  Your next appointment:   3-4 week(s) (after echo appointment)  Provider:   Damien Braver, NP

## 2023-01-17 ENCOUNTER — Ambulatory Visit: Payer: Medicare HMO | Attending: Radiation Oncology

## 2023-01-17 ENCOUNTER — Ambulatory Visit: Payer: Medicare HMO | Attending: Radiation Oncology | Admitting: Physical Therapy

## 2023-01-17 ENCOUNTER — Encounter: Payer: Self-pay | Admitting: Physical Therapy

## 2023-01-17 DIAGNOSIS — M25511 Pain in right shoulder: Secondary | ICD-10-CM | POA: Diagnosis present

## 2023-01-17 DIAGNOSIS — I890 Lymphedema, not elsewhere classified: Secondary | ICD-10-CM | POA: Insufficient documentation

## 2023-01-17 DIAGNOSIS — M25611 Stiffness of right shoulder, not elsewhere classified: Secondary | ICD-10-CM | POA: Insufficient documentation

## 2023-01-17 DIAGNOSIS — R293 Abnormal posture: Secondary | ICD-10-CM | POA: Insufficient documentation

## 2023-01-17 DIAGNOSIS — C12 Malignant neoplasm of pyriform sinus: Secondary | ICD-10-CM | POA: Diagnosis present

## 2023-01-17 DIAGNOSIS — R1310 Dysphagia, unspecified: Secondary | ICD-10-CM | POA: Insufficient documentation

## 2023-01-17 NOTE — Therapy (Signed)
 OUTPATIENT SPEECH LANGUAGE PATHOLOGY ONCOLOGY TREATMENT/RECERTIFICATION   Patient Name: Randall [name] MRN: [id number] DOB:August 16, 1955, 68 y.o., male Today's Date: 01/17/2023  PCP: Tanda Easter, MD REFERRING PROVIDER: Izell Domino, MD  END OF SESSION:  End of Session - 01/17/23 1032     Visit Number 4    Number of Visits 7    Date for SLP Re-Evaluation 04/17/23    SLP Start Time 1018    SLP Stop Time  1051    SLP Time Calculation (min) 33 min    Activity Tolerance Patient tolerated treatment well              Past Medical History:  Diagnosis Date   Complication of anesthesia    Diabetes mellitus without complication (HCC)    Elevated coronary artery calcium  score 02/02/2021   GERD (gastroesophageal reflux disease)    History of kidney stones 2014   Hypertension    Kidney stone 06/17/2015   PONV (postoperative nausea and vomiting)    Rupture of right quadriceps tendon 09/21/2020   - s/p srugical repair   Tuberculosis 1980   Type 2 diabetes mellitus without complication, without long-term current use of insulin  (HCC) 03/21/2020   Past Surgical History:  Procedure Laterality Date   KIDNEY SURGERY     KNEE SURGERY     LITHOTRIPSY     MICROLARYNGOSCOPY Bilateral 06/17/2022   Procedure: DIRECT LARYNGOSCOPY WITH BIOPSY;  Surgeon: Llewellyn Gerard LABOR, DO;  Location: [facility] OR;  Service: ENT;  Laterality: Bilateral;   QUADRICEPS TENDON REPAIR Right 09/25/2020   Procedure: REPAIR RIGHT QUADRICEP TENDON;  Surgeon: Harden Jerona GAILS, MD;  Location: [facility] OR;  Service: Orthopedics;  Laterality: Right;   TONSILLECTOMY     Patient Active Problem List   Diagnosis Date Noted   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   Hyperlipidemia LDL goal <70 02/21/2022   Elevated coronary artery calcium  score 01/26/2021   Thoracic aortic atherosclerosis (HCC) 01/26/2021   Decreased left ventricular systolic function 12/25/2020   Resistant hypertension 11/13/2020    Right bundle branch block 11/13/2020   Hypertensive heart disease with chronic diastolic congestive heart failure (HCC) 11/13/2020   Quadriceps tendon rupture, right, sequela    Speech Therapy Progress Note  Dates of Reporting Period: 10/13/22 to present  Subjective Statement: Pt has been seen for 3 ST visits targeting swallowing in light of radiation tx for head/neck cancer.  Objective: See below.  Goal Update: See below.  Plan: Pt will likely be seen for 1-2 more visits prior to d/c  Reason Skilled Services are Required: Ensure progress over time.     ONSET DATE: January 2024   REFERRING DIAG: Pyriform Sinus Cancer  THERAPY DIAG:  Dysphagia, unspecified type - Plan: SLP plan of care cert/re-cert  Rationale for Evaluation and Treatment: Rehabilitation  SUBJECTIVE:   SUBJECTIVE STATEMENT: Yesterday I had flounder and hush puppies. 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 Hx: He presented with bilateral neck swelling in Jan 2024. He was also diagnosed with COVID around that time and he attributed his symptoms to that. The right neck swelling continued and he presented to his PCP in April 2024 for further evaluation. 05/05/22 A soft tissue neck US  demonstrated a nonspecific 3.2 x 1.5 x 2.5 cm hypoechoic lesion in the right neck, correlating with the palpable site of concern. 05/25/22 CT neck demonstrated a soft tissue  mass centered within the right piriform sinus,  measuring up to 22 x 8 mm, concerning for malignancy. CT also demonstrated a right level 3 lymph node measuring up to 2.8 x 1.8 cm, with central necrosis (correlating with the hypoechoic mass seen on neck ultrasound) suspicious for metastatic disease, and indeterminate asymmetric prominence of right level 2A lymph node, measuring up to 16 x 10 mm   06/14/22 He saw Dr. Llewellyn. Laryngoscopy performed during this visit revealed a  mass lesion of the right piriform sinus which appeared to extend in to the arytenoid and partially onto the area epiglottic fold. 06/17/22 He underwent direct laryngoscopy to obtain biopsies. Biopsy of the right pyriform sinus showed findings consistent with invasive poorly differentiated squamous cell carcinoma (nonkeratinizing). A post cricoid biopsy was also collected and showed no evidence of malignancy. 06/28/22 He met with Dr. Lauralee to discuss treatment options- chemo/radiation vs surgery with possible post op radiation.07/04/22 Consult with Dr. Izell. 07/07/22 PET demonstrated the right piriform sinus primary measuring 1.4 cm with an SUV max of 5.9, and the known level 3 ipsilateral nodal metastasis measuring 2.7 cm with an SUV max of 3.2. PET otherwise showed no evidence of extracervical hypermetabolic metastatic disease, and a nonspecific 2 mm LLL pulmonary nodule. He met with Dr. Loretha on 6/28 to discuss the option of chemotherapy but ultimately decided to proceed with surgery. 08/26/22 endoscopic CO2 laser excision of the hypopharyngeal mass and bilateral neck dissection by Dr. Lauralee. Based on final pathology showing no evidence of extra nodal extension in the positive excised lymph node chemotherapy is not recommended only adjuvant radiation. 09/21/22 Follow up with Dr. Izell. He will receive post operative radiation only. Treatment plan:  He will receive 35 fractions of radiation to his Hypopharynx and bilateral neck.  He started on 9/30 and he will complete 11/8  PAIN:  Are you having pain? Yes: NPRS scale: 6/10 Pain location: neck Pain description: constant; sore Aggravating factors: talking too much, coughing Relieving factors: meds  FALLS: Has patient fallen in last 6 months?  No  PATIENT GOALS: Do everything I can  OBJECTIVE:  Note: Objective measures were completed at Evaluation unless otherwise noted.   TODAY'S TREATMENT:                                                                                                                                          DATE:   01/17/23: I'm still having trouble with spicy food (burns). Pt has eaten foods from a wide variety of textures in the last month, from dys I to regular diet. He IS avoiding crunchy foods like crackers and chips. Today he ate fig bar and water without any signs of oral or overt s/sx pharyngeal deficits.  Pt is doing the HEP, reportedly as prescribed (20 reps/day except Shaker), at least 5 days/week. He was educated about when to decr to x2-3/week (req'd total  A). His performance with HEP was done independently.   12/20/22:  Pt ate black cherry and raspberry yogurt this week. With POs today pt ate applesauce and drank water without any overt s/sx oral or pharyngeal dysphagia.  SLP provided pt with overt s/sx aspiration PNA and pt returned telling SLP 3 of these.  With HEP, pt's frequency has decreased due to pain. Procedure today was WNL; SLP strongly encouraged pt to incr reps to 20/day for each (except Shaker),  no less than 5 reps at a time.  11/17/22: Pt has been performing HEP as well as he can - is holding to scope of HEP as directed with reduced reps. He performed one rep of each exercise for SLP with independence.  He told SLP rationale for HEP with independence. SLP told pt and wife about food journal and pt told SLP benefits of this.   10/13/22 (eval): Research states the risk for dysphagia increases due to radiation and/or chemotherapy treatment due to a variety of factors, so SLP educated the pt about the possibility of reduced/limited ability for PO intake during rad tx. SLP also educated pt regarding possible changes to swallowing musculature after rad tx, and why adherence to dysphagia HEP provided today and PO consumption was necessary to inhibit muscle fibrosis following rad tx and to mitigate muscle disuse atrophy. SLP informed pt why this would be detrimental to their swallowing status and to their  pulmonary health. Pt demonstrated understanding of these things to SLP. SLP encouraged pt to safely eat and drink as deep into their radiation/chemotherapy as possible to provide the best possible long-term swallowing outcome for pt.  SLP then developed an individualized HEP for pt involving oral and pharyngeal strengthening and ROM and pt was instructed how to perform these exercises, including SLP demonstration. After SLP demonstration, pt return demonstrated each exercise. SLP ensured pt performance was correct prior to educating pt on next exercise. Pt required min cues faded to modified independent to perform HEP. Pt was instructed to complete this program 6-7 days/week, at least 2 times a day until 6 months after his or her last day of rad tx, and then x2 a week after that, indefinitely. Among other modifications for days when pt cannot functionally swallow, SLP also suggested pt to perform only non-swallowing tasks on the handout/HEP, and if necessary to cycle through the swallowing portion so the full program of exercises can be completed instead of fatiguing on one of the swallowing exercises and being unable to perform the other swallowing exercises. SLP instructed that swallowing exercises should then be added back into the regimen as pt is able to do so.   PATIENT EDUCATION: Education details: see today's treatment Person educated: Patient Education method: See today's treatment Education comprehension: see Today's treatment  ASSESSMENT:  CLINICAL IMPRESSION: RECERT TODAY. Pt's voice quality is normal. Patient is a 68 y.o. M who was seen today for treatment of swallowing following radiation/chemoradiation therapy. Today pt ate fig bar, and drank thin liquids without overt s/sx oral or pharyngeal difficulty. At this time pt swallowing is deemed WNL/WFL with regular diet with caveats as pt is not eating highly crunchy foods. There are no overt s/s aspiration PNA observed by SLP nor any  reported by pt at this time. Data indicate that pt's swallow ability will likely decrease over the course of radiation/chemoradiation therapy and could very well decline over time following the conclusion of that therapy due to muscle disuse atrophy and/or muscle fibrosis. Pt will cont to  need to be seen by SLP in order to assess safety of PO intake, assess the need for recommending any objective swallow assessment, and ensuring pt is correctly completing the individualized HEP. Likely d/c in 1-2 sessions.    OBJECTIVE IMPAIRMENTS: include dysphagia. These impairments are limiting patient from effectively communicating at home and in community and safety when swallowing. Factors affecting potential to achieve goals and functional outcome are  none noted . Patient will benefit from skilled SLP services to address above impairments and improve overall function.   REHAB POTENTIAL: Good   GOALS: Goals reviewed with patient? No   SHORT TERM GOALS: Target: 3rd total session   1. Pt will complete HEP with modified independence in 2 sessions Baseline: 11/17/22 Goal status: met   2.  pt will tell SLP why pt is completing HEP with modified independence Baseline:  Goal status: met   3.  pt will describe 3 overt s/s aspiration PNA with modified independence Baseline:  Goal status: met   4.  pt will tell SLP how a food journal could hasten return to a more normalized diet Baseline:  Goal status: met     LONG TERM GOALS: Target: 7th total session   1.  pt will complete HEP with independence over two visits Baseline: 11//7/24 Goal status: Met   2.  pt will describe how to modify HEP over time, and the timeline associated with reduction in HEP frequency with modified independence over two sessions Baseline:  Goal status: INITIAL     PLAN:   SLP FREQUENCY:  once approx every 4 weeks   SLP DURATION:  7 total sessions   PLANNED INTERVENTIONS: Aspiration precaution training, Pharyngeal  strengthening exercises, Diet toleration management , Trials of upgraded texture/liquids, SLP instruction and feedback, Compensatory strategies, and Patient/family education    [facility], CCC-SLP 01/17/2023, 12:00 PM

## 2023-01-17 NOTE — Therapy (Signed)
 OUTPATIENT PHYSICAL THERAPY HEAD AND NECK POST RADIATION FOLLOW UP   Patient Name: Randall [name] MRN: [id number] DOB:[birth year], 68 y.o., male Today's Date: 01/17/2023  END OF SESSION:  PT End of Session - 01/17/23 1648     Visit Number 1    Number of Visits 13    Date for PT Re-Evaluation 02/28/23    PT Start Time 1606    PT Stop Time 1652    PT Time Calculation (min) 46 min    Activity Tolerance Patient tolerated treatment well    Behavior During Therapy [facility] for tasks assessed/performed             Past Medical History:  Diagnosis Date   Complication of anesthesia    Diabetes mellitus without complication (HCC)    Elevated coronary artery calcium  score 02/02/2021   GERD (gastroesophageal reflux disease)    History of kidney stones 2014   Hypertension    Kidney stone 06/17/2015   PONV (postoperative nausea and vomiting)    Rupture of right quadriceps tendon 09/21/2020   - s/p srugical repair   Tuberculosis 1980   Type 2 diabetes mellitus without complication, without long-term current use of insulin  (HCC) 03/21/2020   Past Surgical History:  Procedure Laterality Date   KIDNEY SURGERY     KNEE SURGERY     LITHOTRIPSY     MICROLARYNGOSCOPY Bilateral 06/17/2022   Procedure: DIRECT LARYNGOSCOPY WITH BIOPSY;  Surgeon: Llewellyn Gerard LABOR, DO;  Location: [facility] OR;  Service: ENT;  Laterality: Bilateral;   QUADRICEPS TENDON REPAIR Right 09/25/2020   Procedure: REPAIR RIGHT QUADRICEP TENDON;  Surgeon: Harden Jerona GAILS, MD;  Location: [facility] OR;  Service: Orthopedics;  Laterality: Right;   TONSILLECTOMY     Patient Active Problem List   Diagnosis Date Noted   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   Hyperlipidemia LDL goal <70 02/21/2022   Elevated coronary artery calcium  score 01/26/2021   Thoracic aortic atherosclerosis (HCC) 01/26/2021   Decreased left ventricular systolic function 12/25/2020   Resistant hypertension 11/13/2020   Right bundle  branch block 11/13/2020   Hypertensive heart disease with chronic diastolic congestive heart failure (HCC) 11/13/2020   Quadriceps tendon rupture, right, sequela     PCP: Prentice Blush, MD  REFERRING PROVIDER: Lauraine Golden, MD  REFERRING DIAG: C12 (ICD-10-CM) - Pyriform sinus cancer [facility])   THERAPY DIAG:  Lymphedema, not elsewhere classified  Stiffness of right shoulder, not elsewhere classified  Acute pain of right shoulder  Abnormal posture  Pyriform sinus cancer (HCC)  Rationale for Evaluation and Treatment: Rehabilitation  ONSET DATE: 06/17/22  SUBJECTIVE:  SUBJECTIVE STATEMENT: My legs have been swelling a lot. I started lasix  and I lost 11 pounds. I go Friday for an echo cardiogram. I have lymphedema in my neck. I fell on Thanksgiving because my blood sugar was so low. I don't know when my neck swelling started. I can not lift my arm up. I have been trying to do the exercises.  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 He presented with bilateral neck swelling in Jan 2024. He was also diagnosed with COVID around that time and he attributed his symptoms to that. The right neck swelling continued and he presented to his PCP in April 2024 for further evaluation. 05/05/22 A soft tissue neck US  demonstrated a nonspecific 3.2 x 1.5 x 2.5 cm hypoechoic lesion in the right neck, correlating with the palpable site of concern. 05/25/22 CT neck demonstrated a soft tissue mass centered within the right piriform sinus, measuring up to 22 x 8 mm, concerning for malignancy. CT also demonstrated a right level 3 lymph node measuring up to 2.8 x 1.8 cm, with central necrosis (correlating with the hypoechoic mass seen on neck ultrasound) suspicious for  metastatic disease, and indeterminate asymmetric prominence of right level 2A lymph node, measuring up to 16 x 10 mm. 06/14/22 He saw Dr. Llewellyn. Laryngoscopy performed during this visit revealed a mass lesion of the right piriform sinus which appeared to extend in to the arytenoid and partially onto the area epiglottic fold. 06/17/22 He underwent direct laryngoscopy to obtain biopsies. Biopsy of the right pyriform sinus showed findings consistent with invasive poorly differentiated squamous cell carcinoma (nonkeratinizing). A post cricoid biopsy was also collected and showed no evidence of malignancy. 06/28/22 He met with Dr. Lauralee to discuss treatment options- chemo/radiation vs surgery with possible post op radiation. 07/07/22 PET demonstrated the right piriform sinus primary measuring 1.4 cm with an SUV max of 5.9, and the known level 3 ipsilateral nodal metastasis measuring 2.7 cm with an SUV max of 3.2. PET otherwise showed no evidence of extracervical hypermetabolic metastatic disease, and a nonspecific 2 mm LLL pulmonary nodule. He met with Dr. Loretha on 6/28 to discuss the option of chemotherapy but ultimately decided to proceed with surgery. 08/26/22 endoscopic CO2 laser excision of the hypopharyngeal mass and bilateral neck dissection by Dr. Lauralee. Based on final pathology showing no evidence of extra nodal extension in the positive excised lymph node chemotherapy is not recommended only adjuvant radiation. 09/21/22 Follow up with Dr. Izell. He will receive post operative radiation only. He will receive 35 fractions of radiation to his Hypopharynx and bilateral neck. He started on 9/30 and he will complete 11/8.   PATIENT GOALS:  Reassess how my recovery is going related to neck ROM, cervical pain, fatigue, and swelling.  PAIN:  Are you having pain? Yes NPRS scale: 7/10 Pain location: R neck and shoulder Pain orientation: Right  PAIN TYPE: tight, just hurts Pain description: intermittent   Aggravating factors: trying to use it Relieving factors: pain medication  PRECAUTIONS: Recent radiation, Head and neck lymphedema risk,    OBJECTIVE:   POSTURE:  Forward head and rounded shoulders posture   30 SEC SIT TO STAND: 01/17/23: injured R knee when he fell so not tested today At eval on 10/13/22- 14 reps in 30 sec without use of UEs which is  Average for patient's age   SHOULDER AROM:   At eval: Impaired  L shoulder ROM is WFL, R  shoulder ROM is less than 50% of WNL  UPPER EXTREMITY AROM/PROM:  A/PROM RIGHT   eval   Shoulder extension 49  Shoulder flexion 94  Shoulder abduction 56  Shoulder internal rotation   Shoulder external rotation     (Blank rows = not tested)  A/PROM LEFT   eval  Shoulder extension 69  Shoulder flexion 157  Shoulder abduction 170  Shoulder internal rotation   Shoulder external rotation     (Blank rows = not tested)     CERVICAL AROM:     Percent limited 10/13/22 at eval 01/17/23  Flexion [facility] WFL  Extension 25% limited 25% limited  Right lateral flexion 50% limited 25% limited  Left lateral flexion [facility] WFL  Right rotation 25% limited WFL  Left rotation 25% limited WFL                          (Blank rows=not tested)  LYMPHEDEMA ASSESSMENT:    Circumference in cm  4 cm superior to sternal notch around neck 40.6  6 cm superior to sternal notch around neck 42.3  8 cm superior to sternal notch around neck 43.3  R lateral nostril from base of nose to medial tragus   L lateral nostril from base of nose to medial tragus   R corner of mouth to where ear lobe meets face   L corner of mouth to where ear lobe meets face         (Blank rows=not tested)  CURRENT/PAST TREATMENTS:  Surgery type/date: 08/26/22 bilateral neck dissection   Chemotherapy: did not do  Radiation: completed 11/18/22  OTHER SYMPTOMS: Pain Yes Fibrosis No Pitting edema No Infections No Decreased scar mobility Yes  PATIENT EDUCATION:  Education details:  lymphedema eduction and need for compression garment and MLD, wearing chip pack at least 4 hours/day Person educated: Patient Education method: Explanation Education comprehension: verbalized understanding  HOME EXERCISE PROGRAM: Reviewed previously given post op HEP. Wear chip pack for at least 4 hours/day - don't tie too tightly  ASSESSMENT:  CLINICAL IMPRESSION: Pt returns to PT after completing radiation and chemo for treatment of pyriform sinus cancer. He has a bilateral neck dissection in August 2024. He has developed anterior neck lymphedema and demonstrates decreased R shoulder ROM as a result of his neck dissection. He is very limited in direction of flexion and abduction. He has difficulty completing tasks around the house due to decrease ROM. There is loss of muscle bulk in area of his R upper traps. He would benefit from skilled PT services to improve R shoulder ROM and decrease anterior neck lymphedema and progress pt towards independent management.   Pt will benefit from skilled therapeutic intervention to improve on the following deficits: decreased knowledge of condition, decreased knowledge of use of DME, decreased ROM, decreased strength, increased edema, increased fascial restrictions, impaired UE functional use, postural dysfunction, and pain  PT treatment/interventions: ADL/Self care home management, [id number]- PT Re-evaluation, 97110-Therapeutic exercises, 97530- Therapeutic activity, 97535- Self Care, [id number]- Manual therapy, 97760- Orthotic Fit/training, Joint mobilization, Manual lymph drainage, Scar mobilization, and Compression bandaging    GOALS: Goals reviewed with patient? Yes  LONG TERM GOALS:  (STG=LTG) GOALS Name Target Date  Goal status  1 Pt will demonstrate a return to baseline cervical ROM measurements and not demonstrate any signs or symptoms of lymphedema. Baseline: 02/28/23 INITIAL  2 Pt will be indepndent in self MLD for long term managemetn of lymphedema.  02/28/23 NEW  3 Pt will obtain a compression garment for long term management of lymphedema. 02/28/23 NEW  4 Pt will demonstrate 150 degrees of R shoulder flexion to allow him to reach overhead. 02/28/23 NEW  5 Pt will demonstrate 150 degrees of R shoulder abduction to allow him to reach out to the side. 02/28/23 NEW  6 Pt will be independent in a home exercise program for long term stretching and strengthening.  02/28/23 NEW        PLAN:  PT FREQUENCY/DURATION: 2x/wk for 6 wks  PLAN FOR NEXT SESSION: instruct in neck MLD using Norton approach, how is chip pack?, eventually start working on R shoulder ROM and strength following neck dissection surgery   [facility]  [address], [address]  [place] [address]  [phone]   Scar massage You can begin gentle scar massage to you incision sites. Gently place one hand on the incision and move the skin (without sliding on the skin) in various directions. Do this for a few minutes and then you can gently massage either coconut oil or vitamin E cream into the scars.  Home exercise Program Continue doing the exercises you were given until you feel like you can do them without feeling any tightness at the end. It is best to do them for several months after completion of radiation since the effects of radiation continue past completion.   Walking Program Studies show that 30 minutes of walking per day (fast enough to elevate your heart rate) can significantly reduce the risk of a cancer recurrence. If you can't walk due to other medical reasons, we encourage you to find another activity you could do (like a stationary bike or water exercise).  Posture After treatment for head and neck cancer, people frequently sit with rounded shoulders and forward head posture because the front of the neck has become tight and it feels better. If you sit like this, you can become very tight and have pain in sitting or standing with good  posture. Try to be aware of your posture and sit and stand up tall to heal properly.  Follow up PT: Please let you doctor know as soon as possible if you develop any swelling in your face or neck in the future. Lymphedema (swelling) can occur months after completion of radiation. The sooner you can let the doctor know, the sooner they can refer you back to PT. It is much easier to treat the swelling early on.   [facility], PT 01/17/2023, 4:53 PM

## 2023-01-18 ENCOUNTER — Other Ambulatory Visit: Payer: Self-pay | Admitting: Radiology

## 2023-01-18 DIAGNOSIS — C12 Malignant neoplasm of pyriform sinus: Secondary | ICD-10-CM

## 2023-01-18 MED ORDER — LORAZEPAM 0.5 MG PO TABS: 0.5000 mg | ORAL_TABLET | Freq: Three times a day (TID) | ORAL | 0 refills | Status: DC

## 2023-01-18 MED ORDER — HYDROCODONE-ACETAMINOPHEN 7.5-325 MG/15ML PO SOLN: ORAL | 0 refills | Status: DC

## 2023-01-19 LAB — BASIC METABOLIC PANEL
BUN/Creatinine Ratio: 16 (ref 10–24)
BUN: 17 mg/dL (ref 8–27)
CO2: 24 mmol/L (ref 20–29)
Calcium: 8.7 mg/dL (ref 8.6–10.2)
Chloride: 96 mmol/L (ref 96–106)
Creatinine, Ser: 1.05 mg/dL (ref 0.76–1.27)
Glucose: 111 mg/dL — ABNORMAL HIGH (ref 70–99)
Potassium: 4 mmol/L (ref 3.5–5.2)
Sodium: 135 mmol/L (ref 134–144)
eGFR: 78 mL/min/{1.73_m2} (ref >59)

## 2023-01-20 ENCOUNTER — Telehealth: Payer: Self-pay

## 2023-01-20 NOTE — Telephone Encounter (Signed)
Spoke with pt. Pt was notified of lab results. Pt will continue current medication and f/u as planned.  

## 2023-01-25 ENCOUNTER — Ambulatory Visit: Payer: Medicare HMO | Admitting: Physical Therapy

## 2023-01-25 ENCOUNTER — Encounter: Payer: Self-pay | Admitting: Physical Therapy

## 2023-01-25 DIAGNOSIS — M25511 Pain in right shoulder: Secondary | ICD-10-CM

## 2023-01-25 DIAGNOSIS — R293 Abnormal posture: Secondary | ICD-10-CM

## 2023-01-25 DIAGNOSIS — C12 Malignant neoplasm of pyriform sinus: Secondary | ICD-10-CM

## 2023-01-25 DIAGNOSIS — I890 Lymphedema, not elsewhere classified: Secondary | ICD-10-CM | POA: Diagnosis not present

## 2023-01-25 DIAGNOSIS — M25611 Stiffness of right shoulder, not elsewhere classified: Secondary | ICD-10-CM

## 2023-01-25 NOTE — Therapy (Signed)
 OUTPATIENT PHYSICAL THERAPY HEAD AND NECK POST RADIATION FOLLOW UP   Patient Name: Randall [name] MRN: [id number] DOB:[birth year], 68 y.o., male Today's Date: 01/25/2023  END OF SESSION:  PT End of Session - 01/25/23 0807     Visit Number 2    Number of Visits 13    Date for PT Re-Evaluation 02/28/23    PT Start Time 0805    PT Stop Time 0854    PT Time Calculation (min) 49 min    Activity Tolerance Patient tolerated treatment well    Behavior During Therapy [facility] for tasks assessed/performed             Past Medical History:  Diagnosis Date   Complication of anesthesia    Diabetes mellitus without complication (HCC)    Elevated coronary artery calcium  score 02/02/2021   GERD (gastroesophageal reflux disease)    History of kidney stones 2014   Hypertension    Kidney stone 06/17/2015   PONV (postoperative nausea and vomiting)    Rupture of right quadriceps tendon 09/21/2020   - s/p srugical repair   Tuberculosis 1980   Type 2 diabetes mellitus without complication, without long-term current use of insulin  (HCC) 03/21/2020   Past Surgical History:  Procedure Laterality Date   KIDNEY SURGERY     KNEE SURGERY     LITHOTRIPSY     MICROLARYNGOSCOPY Bilateral 06/17/2022   Procedure: DIRECT LARYNGOSCOPY WITH BIOPSY;  Surgeon: Daleen Dubs, DO;  Location: [facility] OR;  Service: ENT;  Laterality: Bilateral;   QUADRICEPS TENDON REPAIR Right 09/25/2020   Procedure: REPAIR RIGHT QUADRICEP TENDON;  Surgeon: Timothy Ford, MD;  Location: [facility] OR;  Service: Orthopedics;  Laterality: Right;   TONSILLECTOMY     Patient Active Problem List   Diagnosis Date Noted   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   Hyperlipidemia LDL goal <70 02/21/2022   Elevated coronary artery calcium  score 01/26/2021   Thoracic aortic atherosclerosis (HCC) 01/26/2021   Decreased left ventricular systolic function 12/25/2020   Resistant hypertension 11/13/2020   Right bundle  branch block 11/13/2020   Hypertensive heart disease with chronic diastolic congestive heart failure (HCC) 11/13/2020   Quadriceps tendon rupture, right, sequela     PCP: Edger Goody, MD  REFERRING PROVIDER: Colie Dawes, MD  REFERRING DIAG: C12 (ICD-10-CM) - Pyriform sinus cancer [facility])   THERAPY DIAG:  Lymphedema, not elsewhere classified  Stiffness of right shoulder, not elsewhere classified  Acute pain of right shoulder  Abnormal posture  Pyriform sinus cancer (HCC)  Rationale for Evaluation and Treatment: Rehabilitation  ONSET DATE: 06/17/22  SUBJECTIVE:  SUBJECTIVE STATEMENT: I am tired today. I put on my garment and I have been wearing it since I got it. I have only taken it off to wash my hair. I have been sleeping in it.   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 He presented with bilateral neck swelling in Jan 2024. He was also diagnosed with COVID around that time and he attributed his symptoms to that. The right neck swelling continued and he presented to his PCP in April 2024 for further evaluation. 05/05/22 A soft tissue neck US  demonstrated a nonspecific 3.2 x 1.5 x 2.5 cm hypoechoic lesion in the right neck, correlating with the palpable site of concern. 05/25/22 CT neck demonstrated a soft tissue mass centered within the right piriform sinus, measuring up to 22 x 8 mm, concerning for malignancy. CT also demonstrated a right level 3 lymph node measuring up to 2.8 x 1.8 cm, with central necrosis (correlating with the hypoechoic mass seen on neck ultrasound) suspicious for metastatic disease, and indeterminate asymmetric prominence of right level 2A lymph node, measuring up to 16 x 10 mm. 06/14/22 He saw Dr. Westley Hammers. Laryngoscopy  performed during this visit revealed a mass lesion of the right piriform sinus which appeared to extend in to the arytenoid and partially onto the area epiglottic fold. 06/17/22 He underwent direct laryngoscopy to obtain biopsies. Biopsy of the right pyriform sinus showed findings consistent with invasive poorly differentiated squamous cell carcinoma (nonkeratinizing). A post cricoid biopsy was also collected and showed no evidence of malignancy. 06/28/22 He met with Dr. Caldwell Castles to discuss treatment options- chemo/radiation vs surgery with possible post op radiation. 07/07/22 PET demonstrated the right piriform sinus primary measuring 1.4 cm with an SUV max of 5.9, and the known level 3 ipsilateral nodal metastasis measuring 2.7 cm with an SUV max of 3.2. PET otherwise showed no evidence of extracervical hypermetabolic metastatic disease, and a nonspecific 2 mm LLL pulmonary nodule. He met with Dr. Arno Bibles on 6/28 to discuss the option of chemotherapy but ultimately decided to proceed with surgery. 08/26/22 endoscopic CO2 laser excision of the hypopharyngeal mass and bilateral neck dissection by Dr. Caldwell Castles. Based on final pathology showing no evidence of extra nodal extension in the positive excised lymph node chemotherapy is not recommended only adjuvant radiation. 09/21/22 Follow up with Dr. Lurena Sally. He will receive post operative radiation only. He will receive 35 fractions of radiation to his Hypopharynx and bilateral neck. He started on 9/30 and he will complete 11/8.   PATIENT GOALS:  Reassess how my recovery is going related to neck ROM, cervical pain, fatigue, and swelling.  PAIN:  Are you having pain? Yes NPRS scale: 7/10 Pain location: R neck and shoulder Pain orientation: Right  PAIN TYPE: tight, just hurts Pain description: intermittent  Aggravating factors: trying to use it Relieving factors: pain medication  PRECAUTIONS: Recent radiation, Head and neck lymphedema risk,    OBJECTIVE:    POSTURE:  Forward head and rounded shoulders posture   30 SEC SIT TO STAND: 01/17/23: injured R knee when he fell so not tested today At eval on 10/13/22- 14 reps in 30 sec without use of UEs which is  Average for patient's age   SHOULDER AROM:   At eval: Impaired  L shoulder ROM is WFL, R shoulder ROM is less than 50% of WNL  UPPER EXTREMITY AROM/PROM:  A/PROM RIGHT   eval   Shoulder extension 49  Shoulder flexion  94  Shoulder abduction 56  Shoulder internal rotation   Shoulder external rotation     (Blank rows = not tested)  A/PROM LEFT   eval  Shoulder extension 69  Shoulder flexion 157  Shoulder abduction 170  Shoulder internal rotation   Shoulder external rotation     (Blank rows = not tested)     CERVICAL AROM:     Percent limited 10/13/22 at eval 01/17/23  Flexion [facility] WFL  Extension 25% limited 25% limited  Right lateral flexion 50% limited 25% limited  Left lateral flexion [facility] WFL  Right rotation 25% limited WFL  Left rotation 25% limited WFL                          (Blank rows=not tested)  LYMPHEDEMA ASSESSMENT:    Circumference in cm 01/25/23  4 cm superior to sternal notch around neck 40.6 40.5  6 cm superior to sternal notch around neck 42.3 41.5  8 cm superior to sternal notch around neck 43.3 42.5  R lateral nostril from base of nose to medial tragus    L lateral nostril from base of nose to medial tragus    R corner of mouth to where ear lobe meets face    L corner of mouth to where ear lobe meets face            (Blank rows=not tested)  CURRENT/PAST TREATMENTS:  Surgery type/date: 08/26/22 bilateral neck dissection   Chemotherapy: did not do  Radiation: completed 11/18/22  OTHER SYMPTOMS: Pain Yes Fibrosis No Pitting edema No Infections No Decreased scar mobility Yes  TREATMENT PERFORMED: 01/25/23:  Remeasured circumferences Instructed pt using Norton anterior approach handout as follows seated in front of mirror: short neck, 5  diaphragmatic breaths, bilateral axillary nodes, bilateral pectoral nodes, anterior chest, short neck, posterior neck moving fluid towards pathway aimed at lateral neck, then lateral and anterior neck moving fluid towards pathway aimed at lateral neck then retracing all steps. Educated pt in anatomy and physiology of the lymphatic system and had pt return demonstrate each step while therapist provided v/c and t/c for direction of skin stretch, pressure and sequence.  Assisted pt with re donning his compression garment at the end of the session  PATIENT EDUCATION:  Education details: lymphedema eduction and need for compression garment and MLD, wearing chip pack at least 4 hours/day Person educated: Patient Education method: Explanation Education comprehension: verbalized understanding  HOME EXERCISE PROGRAM: Reviewed previously given post op HEP. Wear chip pack for at least 4 hours/day - don't tie too tightly  ASSESSMENT:  CLINICAL IMPRESSION: Pt is demonstrating excellent reduction of lymphedema. He has been wearing his new tribute head and neck garment nearly all day and night since he received it. His neck circumferences have reduced. Began instructing pt in self MLD for his neck lymphedema and he only required minimal v/c and t/c. He did well will his pressure. Issued handout for him to begin practicing at home.   Pt will benefit from skilled therapeutic intervention to improve on the following deficits: decreased knowledge of condition, decreased knowledge of use of DME, decreased ROM, decreased strength, increased edema, increased fascial restrictions, impaired UE functional use, postural dysfunction, and pain  PT treatment/interventions: ADL/Self care home management, [id number]- PT Re-evaluation, 97110-Therapeutic exercises, 97530- Therapeutic activity, 97535- Self Care, 91478- Manual therapy, 97760- Orthotic Fit/training, Joint mobilization, Manual lymph drainage, Scar mobilization, and  Compression bandaging    GOALS: Goals reviewed  with patient? Yes  LONG TERM GOALS:  (STG=LTG) GOALS Name Target Date  Goal status  1 Pt will demonstrate a return to baseline cervical ROM measurements and not demonstrate any signs or symptoms of lymphedema. Baseline: 02/28/23 INITIAL  2 Pt will be indepndent in self MLD for long term managemetn of lymphedema. 02/28/23 NEW  3 Pt will obtain a compression garment for long term management of lymphedema. 02/28/23 NEW  4 Pt will demonstrate 150 degrees of R shoulder flexion to allow him to reach overhead. 02/28/23 NEW  5 Pt will demonstrate 150 degrees of R shoulder abduction to allow him to reach out to the side. 02/28/23 NEW  6 Pt will be independent in a home exercise program for long term stretching and strengthening.  02/28/23 NEW        PLAN:  PT FREQUENCY/DURATION: 2x/wk for 6 wks  PLAN FOR NEXT SESSION:cont to instruct in neck MLD using Norton approach, STM to R neck and shoulder,  eventually start working on R shoulder ROM and strength following neck dissection surgery   [facility]  [address], [address]  [place] [address]  [phone]   Scar massage You can begin gentle scar massage to you incision sites. Gently place one hand on the incision and move the skin (without sliding on the skin) in various directions. Do this for a few minutes and then you can gently massage either coconut oil or vitamin E cream into the scars.  Home exercise Program Continue doing the exercises you were given until you feel like you can do them without feeling any tightness at the end. It is best to do them for several months after completion of radiation since the effects of radiation continue past completion.   Walking Program Studies show that 30 minutes of walking per day (fast enough to elevate your heart rate) can significantly reduce the risk of a cancer recurrence. If you can't walk due to other medical reasons,  we encourage you to find another activity you could do (like a stationary bike or water exercise).  Posture After treatment for head and neck cancer, people frequently sit with rounded shoulders and forward head posture because the front of the neck has become tight and it feels better. If you sit like this, you can become very tight and have pain in sitting or standing with good posture. Try to be aware of your posture and sit and stand up tall to heal properly.  Follow up PT: Please let you doctor know as soon as possible if you develop any swelling in your face or neck in the future. Lymphedema (swelling) can occur months after completion of radiation. The sooner you can let the doctor know, the sooner they can refer you back to PT. It is much easier to treat the swelling early on.   [facility] [place], PT 01/25/2023, 9:01 AM

## 2023-01-26 ENCOUNTER — Other Ambulatory Visit: Payer: Self-pay | Admitting: Radiology

## 2023-01-26 DIAGNOSIS — C12 Malignant neoplasm of pyriform sinus: Secondary | ICD-10-CM

## 2023-01-26 MED ORDER — HYDROCODONE-ACETAMINOPHEN 7.5-325 MG/15ML PO SOLN: ORAL | 0 refills | Status: DC

## 2023-01-27 ENCOUNTER — Ambulatory Visit: Payer: Medicare HMO | Admitting: Rehabilitation

## 2023-01-27 ENCOUNTER — Encounter: Payer: Self-pay | Admitting: Rehabilitation

## 2023-01-27 ENCOUNTER — Other Ambulatory Visit: Payer: Self-pay | Admitting: Radiology

## 2023-01-27 DIAGNOSIS — M25511 Pain in right shoulder: Secondary | ICD-10-CM

## 2023-01-27 DIAGNOSIS — C12 Malignant neoplasm of pyriform sinus: Secondary | ICD-10-CM

## 2023-01-27 DIAGNOSIS — I890 Lymphedema, not elsewhere classified: Secondary | ICD-10-CM

## 2023-01-27 DIAGNOSIS — M25611 Stiffness of right shoulder, not elsewhere classified: Secondary | ICD-10-CM

## 2023-01-27 DIAGNOSIS — R293 Abnormal posture: Secondary | ICD-10-CM

## 2023-01-27 NOTE — Progress Notes (Signed)
Patient called complaining of 8/10 pain to his neck and requesting a refill on his Hycet. He is needing to take they Hycet every 3 hours in order to make the pain tolerable. I offered to see the patient in office today, but he was unable to come as it is his [birth year] today. I shared that healing times are variable, and the pain that he is experiencing may not be a cause for concern. However, out of an abundance of caution we will move up his CT scan, which was previously expected for 02/17/2023, and have him see Dr. Basilio Cairo for follow-up and to review the scan. Patient expressed understanding and is in agreement with the stated plan.   Of note, he would like the scan to be scheduled no sooner than 02/02/23 due to conflicts with his wife's surgery. Hycet was also refilled to his preferred pharmacy yesterday.     Bryan Lemma, PA-C

## 2023-01-27 NOTE — Therapy (Signed)
OUTPATIENT PHYSICAL THERAPY HEAD AND NECK POST RADIATION TREATMENT   Patient Name: Randall [name] MRN: [id number] DOB:12-12-1955, 68 y.o., male Today's Date: 01/27/2023  END OF SESSION:  PT End of Session - 01/27/23 1058     Visit Number 3    Number of Visits 13    Date for PT Re-Evaluation 02/28/23    PT Start Time 1000    PT Stop Time 1054    PT Time Calculation (min) 54 min    Activity Tolerance Patient tolerated treatment well    Behavior During Therapy [facility] for tasks assessed/performed              Past Medical History:  Diagnosis Date   Complication of anesthesia    Diabetes mellitus without complication (HCC)    Elevated coronary artery calcium score 02/02/2021   GERD (gastroesophageal reflux disease)    History of kidney stones 2014   Hypertension    Kidney stone 06/17/2015   PONV (postoperative nausea and vomiting)    Rupture of right quadriceps tendon 09/21/2020   - s/p srugical repair   Tuberculosis 1980   Type 2 diabetes mellitus without complication, without long-term current use of insulin (HCC) 03/21/2020   Past Surgical History:  Procedure Laterality Date   KIDNEY SURGERY     KNEE SURGERY     LITHOTRIPSY     MICROLARYNGOSCOPY Bilateral 06/17/2022   Procedure: DIRECT LARYNGOSCOPY WITH BIOPSY;  Surgeon: Laren Boom, DO;  Location: [facility] OR;  Service: ENT;  Laterality: Bilateral;   QUADRICEPS TENDON REPAIR Right 09/25/2020   Procedure: REPAIR RIGHT QUADRICEP TENDON;  Surgeon: Nadara Mustard, MD;  Location: [facility] OR;  Service: Orthopedics;  Laterality: Right;   TONSILLECTOMY     Patient Active Problem List   Diagnosis Date Noted   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   Hyperlipidemia LDL goal <70 02/21/2022   Elevated coronary artery calcium score 01/26/2021   Thoracic aortic atherosclerosis (HCC) 01/26/2021   Decreased left ventricular systolic function 12/25/2020   Resistant hypertension 11/13/2020   Right  bundle branch block 11/13/2020   Hypertensive heart disease with chronic diastolic congestive heart failure (HCC) 11/13/2020   Quadriceps tendon rupture, right, sequela     PCP: Benedetto Goad, MD  REFERRING PROVIDER: Lonie Peak, MD  REFERRING DIAG: C12 (ICD-10-CM) - Pyriform sinus cancer [facility])   THERAPY DIAG:  Lymphedema, not elsewhere classified  Stiffness of right shoulder, not elsewhere classified  Acute pain of right shoulder  Abnormal posture  Pyriform sinus cancer (HCC)  Rationale for Evaluation and Treatment: Rehabilitation  ONSET DATE: 06/17/22  SUBJECTIVE:  SUBJECTIVE STATEMENT: I vacuumed and shampooed all 4 floors yesterday.  I was done at 330am.    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 He presented with bilateral neck swelling in Jan 2024. He was also diagnosed with COVID around that time and he attributed his symptoms to that. The right neck swelling continued and he presented to his PCP in April 2024 for further evaluation. 05/05/22 A soft tissue neck US demonstrated a nonspecific 3.2 x 1.5 x 2.5 cm hypoechoic lesion in the right neck, correlating with the palpable site of concern. 05/25/22 CT neck demonstrated a soft tissue mass centered within the right piriform sinus, measuring up to 22 x 8 mm, concerning for malignancy. CT also demonstrated a right level 3 lymph node measuring up to 2.8 x 1.8 cm, with central necrosis (correlating with the hypoechoic mass seen on neck ultrasound) suspicious for metastatic disease, and indeterminate asymmetric prominence of right level 2A lymph node, measuring up to 16 x 10 mm. 06/14/22 He saw Dr. Marene Lenz. Laryngoscopy performed during this visit revealed a mass lesion of the right piriform  sinus which appeared to extend in to the arytenoid and partially onto the area epiglottic fold. 06/17/22 He underwent direct laryngoscopy to obtain biopsies. Biopsy of the right pyriform sinus showed findings consistent with invasive poorly differentiated squamous cell carcinoma (nonkeratinizing). A post cricoid biopsy was also collected and showed no evidence of malignancy. 06/28/22 He met with Dr. Hezzie Bump to discuss treatment options- chemo/radiation vs surgery with possible post op radiation. 07/07/22 PET demonstrated the right piriform sinus primary measuring 1.4 cm with an SUV max of 5.9, and the known level 3 ipsilateral nodal metastasis measuring 2.7 cm with an SUV max of 3.2. PET otherwise showed no evidence of extracervical hypermetabolic metastatic disease, and a nonspecific 2 mm LLL pulmonary nodule. He met with Dr. Al Pimple on 6/28 to discuss the option of chemotherapy but ultimately decided to proceed with surgery. 08/26/22 endoscopic CO2 laser excision of the hypopharyngeal mass and bilateral neck dissection by Dr. Hezzie Bump. Completed radiation 11/8.   PATIENT GOALS:  Reassess how my recovery is going related to neck ROM, cervical pain, fatigue, and swelling.  PAIN:  Are you having pain? Yes NPRS scale: 7/10 Pain location: R neck and shoulder Pain orientation: Right  PAIN TYPE: tight, just hurts Pain description: intermittent  Aggravating factors: trying to use it Relieving factors: pain medication  PRECAUTIONS: Recent radiation, Head and neck lymphedema risk,    OBJECTIVE:   POSTURE:  Forward head and rounded shoulders posture   30 SEC SIT TO STAND: 01/17/23: injured R knee when he fell so not tested today At eval on 10/13/22- 14 reps in 30 sec without use of UEs which is  Average for patient's age   SHOULDER AROM:   At eval: Impaired  L shoulder ROM is WFL, R shoulder ROM is less than 50% of WNL  UPPER EXTREMITY AROM/PROM:  A/PROM RIGHT   eval   Shoulder extension 49  Shoulder  flexion 94  Shoulder abduction 56  Shoulder internal rotation   Shoulder external rotation     (Blank rows = not tested)  A/PROM LEFT   eval  Shoulder extension 69  Shoulder flexion 157  Shoulder abduction 170  Shoulder internal rotation   Shoulder external rotation     (Blank rows = not tested)     CERVICAL AROM:     Percent limited 10/13/22 at eval 01/17/23  Flexion [facility] WFL  Extension 25% limited 25% limited  Right lateral flexion 50% limited 25% limited  Left lateral flexion [facility] WFL  Right rotation 25% limited WFL  Left rotation 25% limited WFL                          (Blank rows=not tested)  LYMPHEDEMA ASSESSMENT:    Circumference in cm 01/25/23  4 cm superior to sternal notch around neck 40.6 40.5  6 cm superior to sternal notch around neck 42.3 41.5  8 cm superior to sternal notch around neck 43.3 42.5  R lateral nostril from base of nose to medial tragus    L lateral nostril from base of nose to medial tragus    R corner of mouth to where ear lobe meets face    L corner of mouth to where ear lobe meets face            (Blank rows=not tested)  CURRENT/PAST TREATMENTS:  Surgery type/date: 08/26/22 bilateral neck dissection   Chemotherapy: did not do  Radiation: completed 11/18/22  OTHER SYMPTOMS: Pain Yes Fibrosis No Pitting edema No Infections No Decreased scar mobility Yes  TREATMENT PERFORMED: 01/27/23:  Continued instructing pt using Norton anterior approach handout as follows seated in front of mirror: short neck, 5 diaphragmatic breaths, bilateral axillary nodes, bilateral pectoral nodes, anterior chest, short neck, posterior neck moving fluid towards pathway aimed at lateral neck, then lateral and anterior neck moving fluid towards pathway aimed at lateral neck then retracing all steps. Had pt return demonstrate each step while therapist provided v/c and t/c for direction of skin stretch, pressure and sequence.  Then supine additional work to the neck  by PT  01/25/23:  Remeasured circumferences Instructed pt using Norton anterior approach handout as follows seated in front of mirror: short neck, 5 diaphragmatic breaths, bilateral axillary nodes, bilateral pectoral nodes, anterior chest, short neck, posterior neck moving fluid towards pathway aimed at lateral neck, then lateral and anterior neck moving fluid towards pathway aimed at lateral neck then retracing all steps. Educated pt in anatomy and physiology of the lymphatic system and had pt return demonstrate each step while therapist provided v/c and t/c for direction of skin stretch, pressure and sequence.  Assisted pt with re donning his compression garment at the end of the session  PATIENT EDUCATION:  Education details: lymphedema eduction and need for compression garment and MLD, wearing chip pack at least 4 hours/day Person educated: Patient Education method: Explanation Education comprehension: verbalized understanding  HOME EXERCISE PROGRAM: Reviewed previously given post op HEP. Wear chip pack for at least 4 hours/day - don't tie too tightly  ASSESSMENT:  CLINICAL IMPRESSION: Continued edu in self MLD for his neck lymphedema and he only required minimal v/cs only.  Overall he is ind with this.  He is also ind with donning his garment.  He would benefit from continued MLD and starting to work on shoulder ROM.   Pt will benefit from skilled therapeutic intervention to improve on the following deficits: decreased knowledge of condition, decreased knowledge of use of DME, decreased ROM, decreased strength, increased edema, increased fascial restrictions, impaired UE functional use, postural dysfunction, and pain  PT treatment/interventions: ADL/Self care home management, [phone]- PT Re-evaluation, 97110-Therapeutic exercises, 97530- Therapeutic activity, 97535- Self Care, [id number]- Manual therapy, 97760- Orthotic Fit/training, Joint mobilization, Manual lymph drainage, Scar mobilization,  and Compression bandaging    GOALS: Goals reviewed with patient? Yes  LONG  TERM GOALS:  (STG=LTG) GOALS Name Target Date  Goal status  1 Pt will demonstrate a return to baseline cervical ROM measurements and not demonstrate any signs or symptoms of lymphedema. Baseline: 02/28/23 INITIAL  2 Pt will be indepndent in self MLD for long term managemetn of lymphedema. 02/28/23 NEW  3 Pt will obtain a compression garment for long term management of lymphedema. 02/28/23 NEW  4 Pt will demonstrate 150 degrees of R shoulder flexion to allow him to reach overhead. 02/28/23 NEW  5 Pt will demonstrate 150 degrees of R shoulder abduction to allow him to reach out to the side. 02/28/23 NEW  6 Pt will be independent in a home exercise program for long term stretching and strengthening.  02/28/23 NEW        PLAN:  PT FREQUENCY/DURATION: 2x/wk for 6 wks  PLAN FOR NEXT SESSION:cont to instruct in neck MLD using Norton approach, STM to R neck and shoulder,  eventually start working on R shoulder ROM and strength following neck dissection surgery   [facility]  [address], [address]  [place] [address]  [phone]   Scar massage You can begin gentle scar massage to you incision sites. Gently place one hand on the incision and move the skin (without sliding on the skin) in various directions. Do this for a few minutes and then you can gently massage either coconut oil or vitamin E cream into the scars.  Home exercise Program Continue doing the exercises you were given until you feel like you can do them without feeling any tightness at the end. It is best to do them for several months after completion of radiation since the effects of radiation continue past completion.   Walking Program Studies show that 30 minutes of walking per day (fast enough to elevate your heart rate) can significantly reduce the risk of a cancer recurrence. If you can't walk due to other medical  reasons, we encourage you to find another activity you could do (like a stationary bike or water exercise).  Posture After treatment for head and neck cancer, people frequently sit with rounded shoulders and forward head posture because the front of the neck has become tight and it feels better. If you sit like this, you can become very tight and have pain in sitting or standing with good posture. Try to be aware of your posture and sit and stand up tall to heal properly.  Follow up PT: Please let you doctor know as soon as possible if you develop any swelling in your face or neck in the future. Lymphedema (swelling) can occur months after completion of radiation. The sooner you can let the doctor know, the sooner they can refer you back to PT. It is much easier to treat the swelling early on.   Idamae Lusher, PT 01/27/2023, 10:59 AM

## 2023-01-30 ENCOUNTER — Encounter: Payer: Self-pay | Admitting: Rehabilitation

## 2023-01-30 ENCOUNTER — Ambulatory Visit: Payer: Medicare HMO | Admitting: Rehabilitation

## 2023-01-30 DIAGNOSIS — I890 Lymphedema, not elsewhere classified: Secondary | ICD-10-CM | POA: Diagnosis not present

## 2023-01-30 DIAGNOSIS — C12 Malignant neoplasm of pyriform sinus: Secondary | ICD-10-CM

## 2023-01-30 DIAGNOSIS — M25511 Pain in right shoulder: Secondary | ICD-10-CM

## 2023-01-30 DIAGNOSIS — M25611 Stiffness of right shoulder, not elsewhere classified: Secondary | ICD-10-CM

## 2023-01-30 DIAGNOSIS — R293 Abnormal posture: Secondary | ICD-10-CM

## 2023-01-30 NOTE — Therapy (Signed)
OUTPATIENT PHYSICAL THERAPY HEAD AND NECK POST RADIATION TREATMENT   Patient Name: Randall [name] MRN: [id number] DOB:30-Jan-1955, 68 y.o., male Today's Date: 01/30/2023  END OF SESSION:  PT End of Session - 01/30/23 1546     Visit Number 4    Number of Visits 13    Date for PT Re-Evaluation 02/28/23    PT Start Time 1500    PT Stop Time 1548    PT Time Calculation (min) 48 min    Activity Tolerance Patient tolerated treatment well    Behavior During Therapy [facility] for tasks assessed/performed               Past Medical History:  Diagnosis Date   Complication of anesthesia    Diabetes mellitus without complication (HCC)    Elevated coronary artery calcium score 02/02/2021   GERD (gastroesophageal reflux disease)    History of kidney stones 2014   Hypertension    Kidney stone 06/17/2015   PONV (postoperative nausea and vomiting)    Rupture of right quadriceps tendon 09/21/2020   - s/p srugical repair   Tuberculosis 1980   Type 2 diabetes mellitus without complication, without long-term current use of insulin (HCC) 03/21/2020   Past Surgical History:  Procedure Laterality Date   KIDNEY SURGERY     KNEE SURGERY     LITHOTRIPSY     MICROLARYNGOSCOPY Bilateral 06/17/2022   Procedure: DIRECT LARYNGOSCOPY WITH BIOPSY;  Surgeon: Laren Boom, DO;  Location: [facility] OR;  Service: ENT;  Laterality: Bilateral;   QUADRICEPS TENDON REPAIR Right 09/25/2020   Procedure: REPAIR RIGHT QUADRICEP TENDON;  Surgeon: Nadara Mustard, MD;  Location: [facility] OR;  Service: Orthopedics;  Laterality: Right;   TONSILLECTOMY     Patient Active Problem List   Diagnosis Date Noted   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   Hyperlipidemia LDL goal <70 02/21/2022   Elevated coronary artery calcium score 01/26/2021   Thoracic aortic atherosclerosis (HCC) 01/26/2021   Decreased left ventricular systolic function 12/25/2020   Resistant hypertension 11/13/2020   Right  bundle branch block 11/13/2020   Hypertensive heart disease with chronic diastolic congestive heart failure (HCC) 11/13/2020   Quadriceps tendon rupture, right, sequela     PCP: Benedetto Goad, MD  REFERRING PROVIDER: Lonie Peak, MD  REFERRING DIAG: C12 (ICD-10-CM) - Pyriform sinus cancer [facility])   THERAPY DIAG:  Lymphedema, not elsewhere classified  Stiffness of right shoulder, not elsewhere classified  Acute pain of right shoulder  Abnormal posture  Pyriform sinus cancer (HCC)  Rationale for Evaluation and Treatment: Rehabilitation  ONSET DATE: 06/17/22  SUBJECTIVE:  SUBJECTIVE STATEMENT:  Nothing new   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 He presented with bilateral neck swelling in Jan 2024. He was also diagnosed with COVID around that time and he attributed his symptoms to that. The right neck swelling continued and he presented to his PCP in April 2024 for further evaluation. 05/05/22 A soft tissue neck US demonstrated a nonspecific 3.2 x 1.5 x 2.5 cm hypoechoic lesion in the right neck, correlating with the palpable site of concern. 05/25/22 CT neck demonstrated a soft tissue mass centered within the right piriform sinus, measuring up to 22 x 8 mm, concerning for malignancy. CT also demonstrated a right level 3 lymph node measuring up to 2.8 x 1.8 cm, with central necrosis (correlating with the hypoechoic mass seen on neck ultrasound) suspicious for metastatic disease, and indeterminate asymmetric prominence of right level 2A lymph node, measuring up to 16 x 10 mm. 06/14/22 He saw Dr. Marene Lenz. Laryngoscopy performed during this visit revealed a mass lesion of the right piriform sinus which appeared to extend in to the arytenoid and  partially onto the area epiglottic fold. 06/17/22 He underwent direct laryngoscopy to obtain biopsies. Biopsy of the right pyriform sinus showed findings consistent with invasive poorly differentiated squamous cell carcinoma (nonkeratinizing). A post cricoid biopsy was also collected and showed no evidence of malignancy. 06/28/22 He met with Dr. Hezzie Bump to discuss treatment options- chemo/radiation vs surgery with possible post op radiation. 07/07/22 PET demonstrated the right piriform sinus primary measuring 1.4 cm with an SUV max of 5.9, and the known level 3 ipsilateral nodal metastasis measuring 2.7 cm with an SUV max of 3.2. PET otherwise showed no evidence of extracervical hypermetabolic metastatic disease, and a nonspecific 2 mm LLL pulmonary nodule. He met with Dr. Al Pimple on 6/28 to discuss the option of chemotherapy but ultimately decided to proceed with surgery. 08/26/22 endoscopic CO2 laser excision of the hypopharyngeal mass and bilateral neck dissection by Dr. Hezzie Bump. Completed radiation 11/8.   PATIENT GOALS:  Reassess how my recovery is going related to neck ROM, cervical pain, fatigue, and swelling.  PAIN:  Are you having pain? Yes NPRS scale: 7/10 Pain location: R neck and shoulder Pain orientation: Right  PAIN TYPE: tight, just hurts Pain description: intermittent  Aggravating factors: trying to use it Relieving factors: pain medication  PRECAUTIONS: Recent radiation, Head and neck lymphedema risk,    OBJECTIVE:   POSTURE:  Forward head and rounded shoulders posture   30 SEC SIT TO STAND: 01/17/23: injured R knee when he fell so not tested today At eval on 10/13/22- 14 reps in 30 sec without use of UEs which is  Average for patient's age   SHOULDER AROM:   At eval: Impaired  L shoulder ROM is WFL, R shoulder ROM is less than 50% of WNL  UPPER EXTREMITY AROM/PROM:  A/PROM RIGHT   eval  01/30/23  Shoulder extension 49   Shoulder flexion 94 115  Shoulder abduction 56 70   Shoulder internal rotation    Shoulder external rotation      (Blank rows = not tested)  A/PROM LEFT   eval  Shoulder extension 69  Shoulder flexion 157  Shoulder abduction 170  Shoulder internal rotation   Shoulder external rotation     (Blank rows = not tested)     CERVICAL AROM:     Percent limited 10/13/22 at eval 01/17/23  Flexion [facility] WFL  Extension 25%  limited 25% limited  Right lateral flexion 50% limited 25% limited  Left lateral flexion [facility] WFL  Right rotation 25% limited WFL  Left rotation 25% limited WFL                          (Blank rows=not tested)  LYMPHEDEMA ASSESSMENT:    Circumference in cm 01/25/23  4 cm superior to sternal notch around neck 40.6 40.5  6 cm superior to sternal notch around neck 42.3 41.5  8 cm superior to sternal notch around neck 43.3 42.5  R lateral nostril from base of nose to medial tragus    L lateral nostril from base of nose to medial tragus    R corner of mouth to where ear lobe meets face    L corner of mouth to where ear lobe meets face            (Blank rows=not tested)  CURRENT/PAST TREATMENTS:  Surgery type/date: 08/26/22 bilateral neck dissection   Chemotherapy: did not do  Radiation: completed 11/18/22  OTHER SYMPTOMS: Pain Yes Fibrosis No Pitting edema No Infections No Decreased scar mobility Yes  TREATMENT PERFORMED: 01/30/23 Standing single arm row x 10 with arm using other chair Standing single arm horizontal abduction x 10 Standing single arm shoulder shrug x 10 Ranger flexion x 6, abduction x 4 then some increased pain Supine pro/ret x 6 Pt reports doing most of these at home [place] anterior approach handout as follows seated in front of mirror: short neck, 5 diaphragmatic breaths, bilateral axillary nodes, bilateral pectoral nodes, anterior chest, short neck, posterior neck moving fluid towards pathway aimed at lateral neck, then lateral and anterior neck moving fluid towards pathway aimed at lateral  neck then retracing all steps. Had pt return demonstrate each step while therapist provided v/c and t/c for direction of skin stretch, pressure and sequence.   01/27/23:  Continued instructing pt using [place] anterior approach handout as follows seated in front of mirror: short neck, 5 diaphragmatic breaths, bilateral axillary nodes, bilateral pectoral nodes, anterior chest, short neck, posterior neck moving fluid towards pathway aimed at lateral neck, then lateral and anterior neck moving fluid towards pathway aimed at lateral neck then retracing all steps. Had pt return demonstrate each step while therapist provided v/c and t/c for direction of skin stretch, pressure and sequence.  Then supine additional work to the neck by PT  01/25/23:  Remeasured circumferences Instructed pt using [place] anterior approach handout as follows seated in front of mirror: short neck, 5 diaphragmatic breaths, bilateral axillary nodes, bilateral pectoral nodes, anterior chest, short neck, posterior neck moving fluid towards pathway aimed at lateral neck, then lateral and anterior neck moving fluid towards pathway aimed at lateral neck then retracing all steps. Educated pt in anatomy and physiology of the lymphatic system and had pt return demonstrate each step while therapist provided v/c and t/c for direction of skin stretch, pressure and sequence.  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  He is doing well with this but is very nerve sensitive on the Rt side of the face. Continued some shoulder AROM/AAROM.   Pt will benefit from skilled therapeutic intervention to improve on the following deficits: decreased knowledge of condition, decreased knowledge of use of DME, decreased ROM, decreased strength, increased edema, increased fascial restrictions, impaired UE functional use, postural dysfunction, and pain  PT treatment/interventions: ADL/Self care home management, [phone]- PT Re-evaluation, 97110-Therapeutic exercises, 97530- Therapeutic activity, 97535- Self Care, [id number]- Manual therapy, 97760- Orthotic Fit/training, Joint mobilization, Manual lymph drainage, Scar mobilization, and Compression bandaging    GOALS: Goals reviewed with patient? Yes  LONG TERM GOALS:  (STG=LTG) GOALS Name Target Date  Goal status  1 Pt will demonstrate a return to baseline cervical ROM measurements and not demonstrate any signs or symptoms of lymphedema. Baseline: 02/28/23 INITIAL  2 Pt will be indepndent in self MLD for long term managemetn of lymphedema. 02/28/23 NEW  3 Pt will obtain a compression garment for long term management of lymphedema. 02/28/23 NEW  4 Pt will demonstrate 150 degrees of R shoulder flexion to allow him to reach overhead. 02/28/23 NEW  5 Pt will demonstrate 150 degrees of R shoulder abduction to allow him to reach out to the side. 02/28/23 NEW  6 Pt will be independent in a home exercise program for long term stretching and strengthening.  02/28/23 NEW        PLAN:  PT FREQUENCY/DURATION: 2x/wk for 6 wks  PLAN FOR NEXT SESSION:cont to instruct in neck MLD using Norton approach, STM to R neck and shoulder,  eventually start working on R shoulder ROM and strength following neck dissection surgery   [facility]  [address], [address]  [place] [address]  [phone]   Scar massage You can begin gentle scar massage to you  incision sites. Gently place one hand on the incision and move the skin (without sliding on the skin) in various directions. Do this for a few minutes and then you can gently massage either coconut oil or vitamin E cream into the scars.  Home exercise Program Continue doing the exercises you were given until you feel like you can do them without feeling any tightness at the end. It is best to do them for several months after completion of radiation since the effects of radiation continue past completion.   Walking Program Studies show that 30 minutes of walking per day (fast enough to elevate your heart rate) can significantly reduce the risk of a cancer recurrence. If you can't walk due to other medical reasons, we encourage you to find another activity you could do (like a stationary bike or water exercise).  Posture After treatment for head and neck cancer, people frequently sit with rounded shoulders and forward head posture because the front of the neck has become tight and it feels better. If you sit like this, you can become very tight and have pain in sitting or standing with good posture. Try to be aware of your posture and sit and stand up tall to heal properly.  Follow up PT: Please let you doctor know as soon as possible if you develop any swelling in your face or neck in the future. Lymphedema (swelling) can occur months after completion of radiation. The sooner you can let the doctor know, the sooner they can refer you back to PT. It is much easier to treat the swelling early on.   Idamae Lusher, PT 01/30/2023, 3:47  PM

## 2023-02-02 ENCOUNTER — Encounter: Payer: Self-pay | Admitting: Physical Therapy

## 2023-02-02 ENCOUNTER — Ambulatory Visit: Payer: Medicare HMO | Admitting: Physical Therapy

## 2023-02-02 DIAGNOSIS — I890 Lymphedema, not elsewhere classified: Secondary | ICD-10-CM

## 2023-02-02 DIAGNOSIS — C12 Malignant neoplasm of pyriform sinus: Secondary | ICD-10-CM

## 2023-02-02 DIAGNOSIS — M25511 Pain in right shoulder: Secondary | ICD-10-CM

## 2023-02-02 DIAGNOSIS — M25611 Stiffness of right shoulder, not elsewhere classified: Secondary | ICD-10-CM

## 2023-02-02 DIAGNOSIS — R293 Abnormal posture: Secondary | ICD-10-CM

## 2023-02-02 NOTE — Therapy (Signed)
OUTPATIENT PHYSICAL THERAPY HEAD AND NECK POST RADIATION TREATMENT   Patient Name: Randall [name] MRN: [id number] DOB:12/02/1955, 68 y.o., male Today's Date: 02/02/2023  END OF SESSION:  PT End of Session - 02/02/23 1508     Visit Number 5    Number of Visits 13    Date for PT Re-Evaluation 02/28/23    PT Start Time 1507    PT Stop Time 1553    PT Time Calculation (min) 46 min    Activity Tolerance Patient tolerated treatment well    Behavior During Therapy [facility] for tasks assessed/performed               Past Medical History:  Diagnosis Date   Complication of anesthesia    Diabetes mellitus without complication (HCC)    Elevated coronary artery calcium score 02/02/2021   GERD (gastroesophageal reflux disease)    History of kidney stones 2014   Hypertension    Kidney stone 06/17/2015   PONV (postoperative nausea and vomiting)    Rupture of right quadriceps tendon 09/21/2020   - s/p srugical repair   Tuberculosis 1980   Type 2 diabetes mellitus without complication, without long-term current use of insulin (HCC) 03/21/2020   Past Surgical History:  Procedure Laterality Date   KIDNEY SURGERY     KNEE SURGERY     LITHOTRIPSY     MICROLARYNGOSCOPY Bilateral 06/17/2022   Procedure: DIRECT LARYNGOSCOPY WITH BIOPSY;  Surgeon: Laren Boom, DO;  Location: [facility] OR;  Service: ENT;  Laterality: Bilateral;   QUADRICEPS TENDON REPAIR Right 09/25/2020   Procedure: REPAIR RIGHT QUADRICEP TENDON;  Surgeon: Nadara Mustard, MD;  Location: [facility] OR;  Service: Orthopedics;  Laterality: Right;   TONSILLECTOMY     Patient Active Problem List   Diagnosis Date Noted   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   Hyperlipidemia LDL goal <70 02/21/2022   Elevated coronary artery calcium score 01/26/2021   Thoracic aortic atherosclerosis (HCC) 01/26/2021   Decreased left ventricular systolic function 12/25/2020   Resistant hypertension 11/13/2020   Right  bundle branch block 11/13/2020   Hypertensive heart disease with chronic diastolic congestive heart failure (HCC) 11/13/2020   Quadriceps tendon rupture, right, sequela     PCP: Benedetto Goad, MD  REFERRING PROVIDER: Lonie Peak, MD  REFERRING DIAG: C12 (ICD-10-CM) - Pyriform sinus cancer [facility])   THERAPY DIAG:  Lymphedema, not elsewhere classified  Stiffness of right shoulder, not elsewhere classified  Acute pain of right shoulder  Abnormal posture  Pyriform sinus cancer (HCC)  Rationale for Evaluation and Treatment: Rehabilitation  ONSET DATE: 06/17/22  SUBJECTIVE:  SUBJECTIVE STATEMENT: I was doing good with my swelling but when I got up in the the night last night I took it off and went back to sleep and woke up and it looked like a football.   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 He presented with bilateral neck swelling in Jan 2024. He was also diagnosed with COVID around that time and he attributed his symptoms to that. The right neck swelling continued and he presented to his PCP in April 2024 for further evaluation. 05/05/22 A soft tissue neck US demonstrated a nonspecific 3.2 x 1.5 x 2.5 cm hypoechoic lesion in the right neck, correlating with the palpable site of concern. 05/25/22 CT neck demonstrated a soft tissue mass centered within the right piriform sinus, measuring up to 22 x 8 mm, concerning for malignancy. CT also demonstrated a right level 3 lymph node measuring up to 2.8 x 1.8 cm, with central necrosis (correlating with the hypoechoic mass seen on neck ultrasound) suspicious for metastatic disease, and indeterminate asymmetric prominence of right level 2A lymph node, measuring up to 16 x 10 mm. 06/14/22 He saw Dr.  Marene Lenz. Laryngoscopy performed during this visit revealed a mass lesion of the right piriform sinus which appeared to extend in to the arytenoid and partially onto the area epiglottic fold. 06/17/22 He underwent direct laryngoscopy to obtain biopsies. Biopsy of the right pyriform sinus showed findings consistent with invasive poorly differentiated squamous cell carcinoma (nonkeratinizing). A post cricoid biopsy was also collected and showed no evidence of malignancy. 06/28/22 He met with Dr. Hezzie Bump to discuss treatment options- chemo/radiation vs surgery with possible post op radiation. 07/07/22 PET demonstrated the right piriform sinus primary measuring 1.4 cm with an SUV max of 5.9, and the known level 3 ipsilateral nodal metastasis measuring 2.7 cm with an SUV max of 3.2. PET otherwise showed no evidence of extracervical hypermetabolic metastatic disease, and a nonspecific 2 mm LLL pulmonary nodule. He met with Dr. Al Pimple on 6/28 to discuss the option of chemotherapy but ultimately decided to proceed with surgery. 08/26/22 endoscopic CO2 laser excision of the hypopharyngeal mass and bilateral neck dissection by Dr. Hezzie Bump. Completed radiation 11/8.   PATIENT GOALS:  Reassess how my recovery is going related to neck ROM, cervical pain, fatigue, and swelling.  PAIN:  Are you having pain? Yes NPRS scale: 7/10 Pain location: R neck and shoulder Pain orientation: Right  PAIN TYPE: tight, just hurts Pain description: intermittent  Aggravating factors: trying to use it Relieving factors: pain medication  PRECAUTIONS: Recent radiation, Head and neck lymphedema risk,    OBJECTIVE:   POSTURE:  Forward head and rounded shoulders posture   30 SEC SIT TO STAND: 01/17/23: injured R knee when he fell so not tested today At eval on 10/13/22- 14 reps in 30 sec without use of UEs which is  Average for patient's age   SHOULDER AROM:   At eval: Impaired  L shoulder ROM is WFL, R shoulder ROM is less than 50%  of WNL  UPPER EXTREMITY AROM/PROM:  A/PROM RIGHT   eval  01/30/23  Shoulder extension 49   Shoulder flexion 94 115  Shoulder abduction 56 70  Shoulder internal rotation    Shoulder external rotation      (Blank rows = not tested)  A/PROM LEFT   eval  Shoulder extension 69  Shoulder flexion 157  Shoulder abduction 170  Shoulder internal rotation   Shoulder external  rotation     (Blank rows = not tested)     CERVICAL AROM:     Percent limited 10/13/22 at eval 01/17/23  Flexion [facility] WFL  Extension 25% limited 25% limited  Right lateral flexion 50% limited 25% limited  Left lateral flexion [facility] WFL  Right rotation 25% limited WFL  Left rotation 25% limited WFL                          (Blank rows=not tested)  LYMPHEDEMA ASSESSMENT:    Circumference in cm 01/25/23  4 cm superior to sternal notch around neck 40.6 40.5  6 cm superior to sternal notch around neck 42.3 41.5  8 cm superior to sternal notch around neck 43.3 42.5  R lateral nostril from base of nose to medial tragus    L lateral nostril from base of nose to medial tragus    R corner of mouth to where ear lobe meets face    L corner of mouth to where ear lobe meets face            (Blank rows=not tested)  CURRENT/PAST TREATMENTS:  Surgery type/date: 08/26/22 bilateral neck dissection   Chemotherapy: did not do  Radiation: completed 11/18/22  OTHER SYMPTOMS: Pain Yes Fibrosis No Pitting edema No Infections No Decreased scar mobility Yes  TREATMENT PERFORMED: 02/02/23: Sidelying R shoulder abduction x 10 Sidelying R shoulder flexion x 10 Sidelying R shoulder ER with towel at elbow x 10 - v/c to keep elbow tucked Resisted isometric walkouts using dual cable cross machine with pt returning therapist demo as follows for R UE: flexion x 10 with 7 lbs, extension x 10 with 10 lbs, ER x 10 with 3 lbs v/c to keep elbow tucked Scapular stabilization on wall with ball x 10 reps in both directions Standing 1 arm  row with 3 lb weight on L x 15 reps v/c to keep arm close to body Scapular retraction with red band x 20 reps  01/30/23 Standing single arm row x 10 with arm using other chair Standing single arm horizontal abduction x 10 Standing single arm shoulder shrug x 10 Ranger flexion x 6, abduction x 4 then some increased pain Supine pro/ret x 6 Pt reports doing most of these at home [place] anterior approach handout as follows seated in front of mirror: short neck, 5 diaphragmatic breaths, bilateral axillary nodes, bilateral pectoral nodes, anterior chest, short neck, posterior neck moving fluid towards pathway aimed at lateral neck, then lateral and anterior neck moving fluid towards pathway aimed at lateral neck then retracing all steps. Had pt return demonstrate each step while therapist provided v/c and t/c for direction of skin stretch, pressure and sequence.   01/27/23:  Continued instructing pt using [place] anterior approach handout as follows seated in front of mirror: short neck, 5 diaphragmatic breaths, bilateral axillary nodes, bilateral pectoral nodes, anterior chest, short neck, posterior neck moving fluid towards pathway aimed at lateral neck, then lateral and anterior neck moving fluid towards pathway aimed at lateral neck then retracing all steps. Had pt return demonstrate each step while therapist provided v/c and t/c for direction of skin stretch, pressure and sequence.  Then supine additional work to the neck by PT  01/25/23:  Remeasured circumferences Instructed pt using [place] anterior approach handout as follows seated in front of mirror: short neck, 5 diaphragmatic breaths, bilateral axillary nodes, bilateral pectoral nodes, anterior chest, short neck, posterior neck moving fluid towards  pathway aimed at lateral neck, then lateral and anterior neck moving fluid towards pathway aimed at lateral neck then retracing all steps. Educated pt in anatomy and physiology of the lymphatic system  and had pt return demonstrate each step while therapist provided v/c and t/c for direction of skin stretch, pressure and sequence.  Assisted pt with re donning his compression garment at the end of the session  PATIENT EDUCATION:  Education details: lymphedema eduction and need for compression garment and MLD, wearing chip pack at least 4 hours/day Person educated: Patient Education method: Explanation Education comprehension: verbalized understanding  HOME EXERCISE PROGRAM: Reviewed previously given post op HEP.- sidelying abduction AROM, flexion AROM, ER AROM, retractions Wear chip pack for at least 4 hours/day - don't tie too tightly  ASSESSMENT:  CLINICAL IMPRESSION: Focused today on strengthening and ROM to R shoulder since pt has been having constant pain. Pt did very well with exercises today and only required minimal v/c for form. Issued red theraband so he can do isometric walkouts at home.   Pt will benefit from skilled therapeutic intervention to improve on the following deficits: decreased knowledge of condition, decreased knowledge of use of DME, decreased ROM, decreased strength, increased edema, increased fascial restrictions, impaired UE functional use, postural dysfunction, and pain  PT treatment/interventions: ADL/Self care home management, [phone]- PT Re-evaluation, 97110-Therapeutic exercises, 97530- Therapeutic activity, 97535- Self Care, [id number]- Manual therapy, 97760- Orthotic Fit/training, Joint mobilization, Manual lymph drainage, Scar mobilization, and Compression bandaging    GOALS: Goals reviewed with patient? Yes  LONG TERM GOALS:  (STG=LTG) GOALS Name Target Date  Goal status  1 Pt will demonstrate a return to baseline cervical ROM measurements and not demonstrate any signs or symptoms of lymphedema. Baseline: 02/28/23 INITIAL  2 Pt will be indepndent in self MLD for long term managemetn of lymphedema. 02/28/23 NEW  3 Pt will obtain a compression garment for  long term management of lymphedema. 02/28/23 NEW  4 Pt will demonstrate 150 degrees of R shoulder flexion to allow him to reach overhead. 02/28/23 NEW  5 Pt will demonstrate 150 degrees of R shoulder abduction to allow him to reach out to the side. 02/28/23 NEW  6 Pt will be independent in a home exercise program for long term stretching and strengthening.  02/28/23 NEW        PLAN:  PT FREQUENCY/DURATION: 2x/wk for 6 wks  PLAN FOR NEXT SESSION:cont to instruct in neck MLD using Norton approach, STM to R neck and shoulder,  continue R UE strengthening to help improve ROM and focus on posture   [facility]  [address], [address]  [place] [address]  [phone]   Scar massage You can begin gentle scar massage to you incision sites. Gently place one hand on the incision and move the skin (without sliding on the skin) in various directions. Do this for a few minutes and then you can gently massage either coconut oil or vitamin E cream into the scars.  Home exercise Program Continue doing the exercises you were given until you feel like you can do them without feeling any tightness at the end. It is best to do them for several months after completion of radiation since the effects of radiation continue past completion.   Walking Program Studies show that 30 minutes of walking per day (fast enough to elevate your heart rate) can significantly reduce the risk of a cancer recurrence. If you can't walk due to other medical reasons,  we encourage you to find another activity you could do (like a stationary bike or water exercise).  Posture After treatment for head and neck cancer, people frequently sit with rounded shoulders and forward head posture because the front of the neck has become tight and it feels better. If you sit like this, you can become very tight and have pain in sitting or standing with good posture. Try to be aware of your posture and sit and stand up  tall to heal properly.  Follow up PT: Please let you doctor know as soon as possible if you develop any swelling in your face or neck in the future. Lymphedema (swelling) can occur months after completion of radiation. The sooner you can let the doctor know, the sooner they can refer you back to PT. It is much easier to treat the swelling early on.   [facility], PT 02/02/2023, 3:53 PM

## 2023-02-06 ENCOUNTER — Telehealth: Payer: Self-pay | Admitting: *Deleted

## 2023-02-06 ENCOUNTER — Other Ambulatory Visit: Payer: Self-pay | Admitting: Radiation Oncology

## 2023-02-06 ENCOUNTER — Other Ambulatory Visit: Payer: Self-pay | Admitting: Radiology

## 2023-02-06 DIAGNOSIS — C12 Malignant neoplasm of pyriform sinus: Secondary | ICD-10-CM

## 2023-02-06 MED ORDER — HYDROCODONE-ACETAMINOPHEN 7.5-325 MG/15ML PO SOLN: ORAL | 0 refills | Status: DC

## 2023-02-06 NOTE — Progress Notes (Signed)
Patient called stating his preferred pharmacy is out of Hycet. Prescription has been sent to an alternate pharmacy.     Bryan Lemma, PA-C

## 2023-02-06 NOTE — Telephone Encounter (Signed)
CALLED PATIENT TO INFORM OF CT FOR 02-09-23- ARRIVAL TIME- 12:45 PM @ [facility], NO RESTRICTIONS TO SCAN, PATIENT TO RECEIVE RESULTS FROM DR. SQUIRE ON 02-10-23 @ 10:20 AM , SPOKE WITH PATIENT AND HE IS AWARE OF THESE APPTS. AND THE INSTRUCTIONS

## 2023-02-07 ENCOUNTER — Encounter: Payer: Self-pay | Admitting: Physical Therapy

## 2023-02-07 ENCOUNTER — Ambulatory Visit: Payer: Medicare HMO | Admitting: Physical Therapy

## 2023-02-07 DIAGNOSIS — C12 Malignant neoplasm of pyriform sinus: Secondary | ICD-10-CM

## 2023-02-07 DIAGNOSIS — M25511 Pain in right shoulder: Secondary | ICD-10-CM

## 2023-02-07 DIAGNOSIS — R293 Abnormal posture: Secondary | ICD-10-CM

## 2023-02-07 DIAGNOSIS — I890 Lymphedema, not elsewhere classified: Secondary | ICD-10-CM

## 2023-02-07 DIAGNOSIS — M25611 Stiffness of right shoulder, not elsewhere classified: Secondary | ICD-10-CM

## 2023-02-07 NOTE — Therapy (Signed)
OUTPATIENT PHYSICAL THERAPY HEAD AND NECK POST RADIATION TREATMENT   Patient Name: Randall [name] MRN: [id number] DOB:12-20-1955, 68 y.o., male Today's Date: 02/07/2023  END OF SESSION:  PT End of Session - 02/07/23 1106     Visit Number 6    Number of Visits 13    Date for PT Re-Evaluation 02/28/23    PT Start Time 1105    PT Stop Time 1151    PT Time Calculation (min) 46 min    Activity Tolerance Patient tolerated treatment well    Behavior During Therapy [facility] for tasks assessed/performed               Past Medical History:  Diagnosis Date   Complication of anesthesia    Diabetes mellitus without complication (HCC)    Elevated coronary artery calcium score 02/02/2021   GERD (gastroesophageal reflux disease)    History of kidney stones 2014   Hypertension    Kidney stone 06/17/2015   PONV (postoperative nausea and vomiting)    Rupture of right quadriceps tendon 09/21/2020   - s/p srugical repair   Tuberculosis 1980   Type 2 diabetes mellitus without complication, without long-term current use of insulin (HCC) 03/21/2020   Past Surgical History:  Procedure Laterality Date   KIDNEY SURGERY     KNEE SURGERY     LITHOTRIPSY     MICROLARYNGOSCOPY Bilateral 06/17/2022   Procedure: DIRECT LARYNGOSCOPY WITH BIOPSY;  Surgeon: Laren Boom, DO;  Location: [facility] OR;  Service: ENT;  Laterality: Bilateral;   QUADRICEPS TENDON REPAIR Right 09/25/2020   Procedure: REPAIR RIGHT QUADRICEP TENDON;  Surgeon: Nadara Mustard, MD;  Location: [facility] OR;  Service: Orthopedics;  Laterality: Right;   TONSILLECTOMY     Patient Active Problem List   Diagnosis Date Noted   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   Hyperlipidemia LDL goal <70 02/21/2022   Elevated coronary artery calcium score 01/26/2021   Thoracic aortic atherosclerosis (HCC) 01/26/2021   Decreased left ventricular systolic function 12/25/2020   Resistant hypertension 11/13/2020   Right  bundle branch block 11/13/2020   Hypertensive heart disease with chronic diastolic congestive heart failure (HCC) 11/13/2020   Quadriceps tendon rupture, right, sequela     PCP: Benedetto Goad, MD  REFERRING PROVIDER: Lonie Peak, MD  REFERRING DIAG: C12 (ICD-10-CM) - Pyriform sinus cancer [facility])   THERAPY DIAG:  Lymphedema, not elsewhere classified  Stiffness of right shoulder, not elsewhere classified  Acute pain of right shoulder  Abnormal posture  Pyriform sinus cancer (HCC)  Rationale for Evaluation and Treatment: Rehabilitation  ONSET DATE: 06/17/22  SUBJECTIVE:  SUBJECTIVE STATEMENT: My neck and throat are hurting. I had to get more pain medication. The sides of my neck are hurting. The back of my neck hurts some. I have my CT on Thursday. I felt sick all day yesterday and was throwing up.   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 He presented with bilateral neck swelling in Jan 2024. He was also diagnosed with COVID around that time and he attributed his symptoms to that. The right neck swelling continued and he presented to his PCP in April 2024 for further evaluation. 05/05/22 A soft tissue neck US demonstrated a nonspecific 3.2 x 1.5 x 2.5 cm hypoechoic lesion in the right neck, correlating with the palpable site of concern. 05/25/22 CT neck demonstrated a soft tissue mass centered within the right piriform sinus, measuring up to 22 x 8 mm, concerning for malignancy. CT also demonstrated a right level 3 lymph node measuring up to 2.8 x 1.8 cm, with central necrosis (correlating with the hypoechoic mass seen on neck ultrasound) suspicious for metastatic disease, and indeterminate asymmetric prominence of right level 2A lymph node,  measuring up to 16 x 10 mm. 06/14/22 He saw Dr. Marene Lenz. Laryngoscopy performed during this visit revealed a mass lesion of the right piriform sinus which appeared to extend in to the arytenoid and partially onto the area epiglottic fold. 06/17/22 He underwent direct laryngoscopy to obtain biopsies. Biopsy of the right pyriform sinus showed findings consistent with invasive poorly differentiated squamous cell carcinoma (nonkeratinizing). A post cricoid biopsy was also collected and showed no evidence of malignancy. 06/28/22 He met with Dr. Hezzie Bump to discuss treatment options- chemo/radiation vs surgery with possible post op radiation. 07/07/22 PET demonstrated the right piriform sinus primary measuring 1.4 cm with an SUV max of 5.9, and the known level 3 ipsilateral nodal metastasis measuring 2.7 cm with an SUV max of 3.2. PET otherwise showed no evidence of extracervical hypermetabolic metastatic disease, and a nonspecific 2 mm LLL pulmonary nodule. He met with Dr. Al Pimple on 6/28 to discuss the option of chemotherapy but ultimately decided to proceed with surgery. 08/26/22 endoscopic CO2 laser excision of the hypopharyngeal mass and bilateral neck dissection by Dr. Hezzie Bump. Completed radiation 11/8.   PATIENT GOALS:  Reassess how my recovery is going related to neck ROM, cervical pain, fatigue, and swelling.  PAIN:  Are you having pain? Yes NPRS scale: 7/10 Pain location: R neck and shoulder Pain orientation: Right  PAIN TYPE: tight, just hurts Pain description: intermittent  Aggravating factors: trying to use it Relieving factors: pain medication  PRECAUTIONS: Recent radiation, Head and neck lymphedema risk,    OBJECTIVE:   POSTURE:  Forward head and rounded shoulders posture   30 SEC SIT TO STAND: 01/17/23: injured R knee when he fell so not tested today At eval on 10/13/22- 14 reps in 30 sec without use of UEs which is  Average for patient's age   SHOULDER AROM:   At eval: Impaired  L  shoulder ROM is WFL, R shoulder ROM is less than 50% of WNL  UPPER EXTREMITY AROM/PROM:  A/PROM RIGHT   eval  01/30/23  Shoulder extension 49   Shoulder flexion 94 115  Shoulder abduction 56 70  Shoulder internal rotation    Shoulder external rotation      (Blank rows = not tested)  A/PROM LEFT   eval  Shoulder extension 69  Shoulder flexion 157  Shoulder abduction 170  Shoulder internal rotation   Shoulder external rotation     (Blank rows = not tested)     CERVICAL AROM:     Percent limited 10/13/22 at eval 01/17/23  Flexion [facility] WFL  Extension 25% limited 25% limited  Right lateral flexion 50% limited 25% limited  Left lateral flexion [facility] WFL  Right rotation 25% limited WFL  Left rotation 25% limited WFL                          (Blank rows=not tested)  LYMPHEDEMA ASSESSMENT:    Circumference in cm 01/25/23  4 cm superior to sternal notch around neck 40.6 40.5  6 cm superior to sternal notch around neck 42.3 41.5  8 cm superior to sternal notch around neck 43.3 42.5  R lateral nostril from base of nose to medial tragus    L lateral nostril from base of nose to medial tragus    R corner of mouth to where ear lobe meets face    L corner of mouth to where ear lobe meets face            (Blank rows=not tested)  CURRENT/PAST TREATMENTS:  Surgery type/date: 08/26/22 bilateral neck dissection   Chemotherapy: did not do  Radiation: completed 11/18/22  OTHER SYMPTOMS: Pain Yes Fibrosis No Pitting edema No Infections No Decreased scar mobility Yes  TREATMENT PERFORMED: 02/07/23 Resisted isometric walkouts using dual cable cross machine with pt returning therapist demo as follows for R UE: flexion x 10 with 3 lbs, extension x 10 with 3 lbs, ER x 10 with 3 lbs v/c to keep elbow tucked, scapular retraction x 10 reps with 3 lbs with v/c for posture throughout Scapular stabilization on wall with ball tracing alphabet on wall with RUE with v/c for posture IASTM with wave  tool In L sidelying to R rhomboids, R upper traps, R levator, and R lateral cervical muscles with multiple areas of muscle tightness noted and therapist unable to use much pressure but by end of session muscle tightness had improved greatly and therapist was able to use normal pressure without discomfort  Educated pt about using tennis ball on wall for self STM at home  02/02/23: Sidelying R shoulder abduction x 10 Sidelying R shoulder flexion x 10 Sidelying R shoulder ER with towel at elbow x 10 - v/c to keep elbow tucked Resisted isometric walkouts using dual cable cross machine with pt returning therapist demo as follows for R UE: flexion x 10 with 7 lbs, extension x 10 with 10 lbs, ER x 10 with 3 lbs v/c to keep elbow tucked Scapular stabilization on wall with ball x 10 reps in both directions Standing 1 arm row with 3 lb weight on L x 15 reps v/c to keep arm close to body Scapular retraction with red band x 20 reps  01/30/23 Standing single arm row x 10 with arm using other chair Standing single arm horizontal abduction x 10 Standing single arm shoulder shrug x 10 Ranger flexion x 6, abduction x 4 then some increased pain Supine pro/ret x 6 Pt reports doing most of these at home Kell anterior approach handout as follows seated in front of mirror: short neck, 5 diaphragmatic breaths, bilateral axillary nodes, bilateral pectoral nodes, anterior chest, short neck, posterior neck moving fluid towards pathway aimed at lateral neck, then lateral and anterior neck moving fluid towards pathway aimed at lateral neck then retracing all steps. Had pt return  demonstrate each step while therapist provided v/c and t/c for direction of skin stretch, pressure and sequence.   01/27/23:  Continued instructing pt using Norton anterior approach handout as follows seated in front of mirror: short neck, 5 diaphragmatic breaths, bilateral axillary nodes, bilateral pectoral nodes, anterior chest, short neck,  posterior neck moving fluid towards pathway aimed at lateral neck, then lateral and anterior neck moving fluid towards pathway aimed at lateral neck then retracing all steps. Had pt return demonstrate each step while therapist provided v/c and t/c for direction of skin stretch, pressure and sequence.  Then supine additional work to the neck by PT  01/25/23:  Remeasured circumferences Instructed pt using Norton anterior approach handout as follows seated in front of mirror: short neck, 5 diaphragmatic breaths, bilateral axillary nodes, bilateral pectoral nodes, anterior chest, short neck, posterior neck moving fluid towards pathway aimed at lateral neck, then lateral and anterior neck moving fluid towards pathway aimed at lateral neck then retracing all steps. Educated pt in anatomy and physiology of the lymphatic system and had pt return demonstrate each step while therapist provided v/c and t/c for direction of skin stretch, pressure and sequence.  Assisted pt with re donning his compression garment at the end of the session  PATIENT EDUCATION:  Education details: lymphedema eduction and need for compression garment and MLD, wearing chip pack at least 4 hours/day Person educated: Patient Education method: Explanation Education comprehension: verbalized understanding  HOME EXERCISE PROGRAM: Reviewed previously given post op HEP.- sidelying abduction AROM, flexion AROM, ER AROM, retractions Wear chip pack for at least 4 hours/day - don't tie too tightly  ASSESSMENT:  CLINICAL IMPRESSION: Pt had some soreness after strengthening exercises last session so decreased resistance to 3 lbs throughout. He still requires v/c for proper posture. Began STM to scapular and cervical musculare on R side with numerous areas of tightness noted and pt very tender but this did improve by end of session.   Pt will benefit from skilled therapeutic intervention to improve on the following deficits: decreased  knowledge of condition, decreased knowledge of use of DME, decreased ROM, decreased strength, increased edema, increased fascial restrictions, impaired UE functional use, postural dysfunction, and pain  PT treatment/interventions: ADL/Self care home management, [phone]- PT Re-evaluation, 97110-Therapeutic exercises, 97530- Therapeutic activity, 97535- Self Care, [id number]- Manual therapy, 97760- Orthotic Fit/training, Joint mobilization, Manual lymph drainage, Scar mobilization, and Compression bandaging    GOALS: Goals reviewed with patient? Yes  LONG TERM GOALS:  (STG=LTG) GOALS Name Target Date  Goal status  1 Pt will demonstrate a return to baseline cervical ROM measurements and not demonstrate any signs or symptoms of lymphedema. Baseline: 02/28/23 INITIAL  2 Pt will be indepndent in self MLD for long term managemetn of lymphedema. 02/28/23 NEW  3 Pt will obtain a compression garment for long term management of lymphedema. 02/28/23 NEW  4 Pt will demonstrate 150 degrees of R shoulder flexion to allow him to reach overhead. 02/28/23 NEW  5 Pt will demonstrate 150 degrees of R shoulder abduction to allow him to reach out to the side. 02/28/23 NEW  6 Pt will be independent in a home exercise program for long term stretching and strengthening.  02/28/23 NEW        PLAN:  PT FREQUENCY/DURATION: 2x/wk for 6 wks  PLAN FOR NEXT SESSION:cont to instruct in neck MLD using Norton approach, STM to R neck and shoulder,  continue R UE strengthening to help improve ROM and focus on posture  [facility]  [address]  [place] [address]  [phone]   Scar massage You can begin gentle scar massage to you incision sites. Gently place one hand on the incision and move the skin (without sliding on the skin) in various directions. Do this for a few minutes and then you can gently massage either coconut oil or vitamin E cream into the scars.  Home exercise  Program Continue doing the exercises you were given until you feel like you can do them without feeling any tightness at the end. It is best to do them for several months after completion of radiation since the effects of radiation continue past completion.   Walking Program Studies show that 30 minutes of walking per day (fast enough to elevate your heart rate) can significantly reduce the risk of a cancer recurrence. If you can't walk due to other medical reasons, we encourage you to find another activity you could do (like a stationary bike or water exercise).  Posture After treatment for head and neck cancer, people frequently sit with rounded shoulders and forward head posture because the front of the neck has become tight and it feels better. If you sit like this, you can become very tight and have pain in sitting or standing with good posture. Try to be aware of your posture and sit and stand up tall to heal properly.  Follow up PT: Please let you doctor know as soon as possible if you develop any swelling in your face or neck in the future. Lymphedema (swelling) can occur months after completion of radiation. The sooner you can let the doctor know, the sooner they can refer you back to PT. It is much easier to treat the swelling early on.   [facility] [place], PT 02/07/2023, 11:58 AM

## 2023-02-08 NOTE — Progress Notes (Signed)
[name] presents today for a follow up after completing radiation to his hypopharynx/supraglottis on 11/18/2022 Here to receive results from recent scan  Pain issues, if any: Yes, reports neck pain, rating 8/10.  Using a feeding tube?: No Weight changes, if any: Yes Wt Readings from Last 3 Encounters:  02/10/23 197 lb (89.4 kg)  01/12/23 192 lb 6.4 oz (87.3 kg)  12/21/22 184 lb 1.3 oz (83.5 kg)     Swallowing issues, if any: No,, reports certain foods causes coughing.  Smoking or chewing tobacco? No Using fluoride toothpaste daily? Yes Last ENT visit was on: Dr. Hezzie Bump- 12/28/22. Patient to follow up in March.    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 Patient currently in PT.  BP (!) 146/76 (BP Location: Left Arm, Patient Position: Sitting, Cuff Size: Normal)   Pulse 69   Temp 97.7 F (36.5 C)   Resp 20   Ht 5\' 10"  (1.778 m)   Wt 197 lb (89.4 kg)   SpO2 99%   BMI 28.27 kg/m

## 2023-02-09 ENCOUNTER — Ambulatory Visit (HOSPITAL_COMMUNITY)
Admission: RE | Admit: 2023-02-09 | Discharge: 2023-02-09 | Disposition: A | Discharge status: Home / Self Care | Payer: Medicare HMO | Source: Ambulatory Visit | Attending: Radiology | Admitting: Radiology

## 2023-02-09 ENCOUNTER — Encounter: Payer: Self-pay | Admitting: Rehabilitation

## 2023-02-09 ENCOUNTER — Ambulatory Visit (HOSPITAL_COMMUNITY)
Admission: RE | Admit: 2023-02-09 | Discharge: 2023-02-09 | Disposition: A | Discharge status: Home / Self Care | Payer: Medicare HMO | Source: Ambulatory Visit | Attending: Nurse Practitioner | Admitting: Nurse Practitioner

## 2023-02-09 ENCOUNTER — Ambulatory Visit: Payer: Medicare HMO | Admitting: Rehabilitation

## 2023-02-09 DIAGNOSIS — I4510 Unspecified right bundle-branch block: Secondary | ICD-10-CM | POA: Diagnosis not present

## 2023-02-09 DIAGNOSIS — R600 Localized edema: Secondary | ICD-10-CM | POA: Diagnosis not present

## 2023-02-09 DIAGNOSIS — C12 Malignant neoplasm of pyriform sinus: Secondary | ICD-10-CM

## 2023-02-09 DIAGNOSIS — R293 Abnormal posture: Secondary | ICD-10-CM

## 2023-02-09 DIAGNOSIS — M25511 Pain in right shoulder: Secondary | ICD-10-CM

## 2023-02-09 DIAGNOSIS — M25611 Stiffness of right shoulder, not elsewhere classified: Secondary | ICD-10-CM

## 2023-02-09 DIAGNOSIS — I5022 Chronic systolic (congestive) heart failure: Secondary | ICD-10-CM | POA: Diagnosis present

## 2023-02-09 DIAGNOSIS — I890 Lymphedema, not elsewhere classified: Secondary | ICD-10-CM | POA: Diagnosis not present

## 2023-02-09 LAB — ECHOCARDIOGRAM COMPLETE
AR max vel: 3.22 cm2
AV Area VTI: 3.39 cm2
AV Area mean vel: 3.24 cm2
AV Mean grad: 4.5 mm[Hg]
AV Peak grad: 8.9 mm[Hg]
Ao pk vel: 1.5 m/s
Area-P 1/2: 2.68 cm2
S' Lateral: 3.05 cm
Single Plane A4C EF: 53.2 %

## 2023-02-09 MED ORDER — IOHEXOL 300 MG/ML  SOLN
80.0000 mL | Freq: Once | INTRAMUSCULAR | Status: AC | PRN
  Administered 2023-02-09: 80 mL via INTRAVENOUS

## 2023-02-09 NOTE — Therapy (Signed)
OUTPATIENT PHYSICAL THERAPY HEAD AND NECK POST RADIATION TREATMENT   Patient Name: Randall [name] MRN: [id number] DOB:[birth year], 68 y.o., male Today's Date: 02/09/2023  END OF SESSION:  PT End of Session - 02/09/23 0911     Visit Number 7    Number of Visits 13    Date for PT Re-Evaluation 02/28/23    PT Start Time 0910    PT Stop Time 0955    PT Time Calculation (min) 45 min    Activity Tolerance Patient tolerated treatment well    Behavior During Therapy [facility] for tasks assessed/performed                Past Medical History:  Diagnosis Date   Complication of anesthesia    Diabetes mellitus without complication (HCC)    Elevated coronary artery calcium score 02/02/2021   GERD (gastroesophageal reflux disease)    History of kidney stones 2014   Hypertension    Kidney stone 06/17/2015   PONV (postoperative nausea and vomiting)    Rupture of right quadriceps tendon 09/21/2020   - s/p srugical repair   Tuberculosis 1980   Type 2 diabetes mellitus without complication, without long-term current use of insulin (HCC) 03/21/2020   Past Surgical History:  Procedure Laterality Date   KIDNEY SURGERY     KNEE SURGERY     LITHOTRIPSY     MICROLARYNGOSCOPY Bilateral 06/17/2022   Procedure: DIRECT LARYNGOSCOPY WITH BIOPSY;  Surgeon: Laren Boom, DO;  Location: [facility] OR;  Service: ENT;  Laterality: Bilateral;   QUADRICEPS TENDON REPAIR Right 09/25/2020   Procedure: REPAIR RIGHT QUADRICEP TENDON;  Surgeon: Nadara Mustard, MD;  Location: [facility] OR;  Service: Orthopedics;  Laterality: Right;   TONSILLECTOMY     Patient Active Problem List   Diagnosis Date Noted   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   Hyperlipidemia LDL goal <70 02/21/2022   Elevated coronary artery calcium score 01/26/2021   Thoracic aortic atherosclerosis (HCC) 01/26/2021   Decreased left ventricular systolic function 12/25/2020   Resistant hypertension 11/13/2020   Right  bundle branch block 11/13/2020   Hypertensive heart disease with chronic diastolic congestive heart failure (HCC) 11/13/2020   Quadriceps tendon rupture, right, sequela     PCP: Benedetto Goad, MD  REFERRING PROVIDER: Lonie Peak, MD  REFERRING DIAG: C12 (ICD-10-CM) - Pyriform sinus cancer [facility])   THERAPY DIAG:  Lymphedema, not elsewhere classified  Stiffness of right shoulder, not elsewhere classified  Acute pain of right shoulder  Abnormal posture  Pyriform sinus cancer (HCC)  Rationale for Evaluation and Treatment: Rehabilitation  ONSET DATE: 06/17/22  SUBJECTIVE:  SUBJECTIVE STATEMENT: My wifes eye surgery was not great yesterday. It didn't really fix the problem.  I felt good after last time.   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 He presented with bilateral neck swelling in Jan 2024. He was also diagnosed with COVID around that time and he attributed his symptoms to that. The right neck swelling continued and he presented to his PCP in April 2024 for further evaluation. 05/05/22 A soft tissue neck US demonstrated a nonspecific 3.2 x 1.5 x 2.5 cm hypoechoic lesion in the right neck, correlating with the palpable site of concern. 05/25/22 CT neck demonstrated a soft tissue mass centered within the right piriform sinus, measuring up to 22 x 8 mm, concerning for malignancy. CT also demonstrated a right level 3 lymph node measuring up to 2.8 x 1.8 cm, with central necrosis (correlating with the hypoechoic mass seen on neck ultrasound) suspicious for metastatic disease, and indeterminate asymmetric prominence of right level 2A lymph node, measuring up to 16 x 10 mm. 06/14/22 He saw Dr. Marene Lenz. Laryngoscopy performed during this visit  revealed a mass lesion of the right piriform sinus which appeared to extend in to the arytenoid and partially onto the area epiglottic fold. 06/17/22 He underwent direct laryngoscopy to obtain biopsies. Biopsy of the right pyriform sinus showed findings consistent with invasive poorly differentiated squamous cell carcinoma (nonkeratinizing). A post cricoid biopsy was also collected and showed no evidence of malignancy. 06/28/22 He met with Dr. Hezzie Bump to discuss treatment options- chemo/radiation vs surgery with possible post op radiation. 07/07/22 PET demonstrated the right piriform sinus primary measuring 1.4 cm with an SUV max of 5.9, and the known level 3 ipsilateral nodal metastasis measuring 2.7 cm with an SUV max of 3.2. PET otherwise showed no evidence of extracervical hypermetabolic metastatic disease, and a nonspecific 2 mm LLL pulmonary nodule. He met with Dr. Al Pimple on 6/28 to discuss the option of chemotherapy but ultimately decided to proceed with surgery. 08/26/22 endoscopic CO2 laser excision of the hypopharyngeal mass and bilateral neck dissection by Dr. Hezzie Bump. Completed radiation 11/8.   PATIENT GOALS:  Reassess how my recovery is going related to neck ROM, cervical pain, fatigue, and swelling.  PAIN:  Are you having pain? Yes NPRS scale: 7/10 Pain location: R neck and shoulder Pain orientation: Right  PAIN TYPE: tight, just hurts Pain description: intermittent  Aggravating factors: trying to use it Relieving factors: pain medication  PRECAUTIONS: Recent radiation, Head and neck lymphedema risk,    OBJECTIVE:   POSTURE:  Forward head and rounded shoulders posture   30 SEC SIT TO STAND: 01/17/23: injured R knee when he fell so not tested today At eval on 10/13/22- 14 reps in 30 sec without use of UEs which is  Average for patient's age   SHOULDER AROM:   At eval: Impaired  L shoulder ROM is WFL, R shoulder ROM is less than 50% of WNL  UPPER EXTREMITY AROM/PROM:  A/PROM RIGHT    eval  01/30/23  Shoulder extension 49   Shoulder flexion 94 115  Shoulder abduction 56 70  Shoulder internal rotation    Shoulder external rotation      (Blank rows = not tested)  A/PROM LEFT   eval  Shoulder extension 69  Shoulder flexion 157  Shoulder abduction 170  Shoulder internal rotation   Shoulder external rotation     (Blank rows = not tested)     CERVICAL  AROM:     Percent limited 10/13/22 at eval 01/17/23  Flexion [facility] WFL  Extension 25% limited 25% limited  Right lateral flexion 50% limited 25% limited  Left lateral flexion [facility] WFL  Right rotation 25% limited WFL  Left rotation 25% limited WFL                          (Blank rows=not tested)  LYMPHEDEMA ASSESSMENT:    Circumference in cm 01/25/23  4 cm superior to sternal notch around neck 40.6 40.5  6 cm superior to sternal notch around neck 42.3 41.5  8 cm superior to sternal notch around neck 43.3 42.5  R lateral nostril from base of nose to medial tragus    L lateral nostril from base of nose to medial tragus    R corner of mouth to where ear lobe meets face    L corner of mouth to where ear lobe meets face            (Blank rows=not tested)  CURRENT/PAST TREATMENTS:  Surgery type/date: 08/26/22 bilateral neck dissection   Chemotherapy: did not do  Radiation: completed 11/18/22  OTHER SYMPTOMS: Pain Yes Fibrosis No Pitting edema No Infections No Decreased scar mobility Yes  TREATMENT PERFORMED: 02/09/23 Therapeutic Exercise:  Pulleys into flexion x and scaption x with initial instruction and cueing for ROM and warm up Resisted isometric walkouts using dual cable cross machine with pt returning therapist demo as follows for R UE: flexion x 10 with 3 lbs, extension x 10 with 3 lbs, ER x 10 with 3 lbs v/c to keep elbow tucked, scapular retraction x 10 reps with 3 lbs with v/c for posture throughout Scapular stabilization on wall with ball tracing alphabet on wall with RUE with v/c for  posture IASTM with wave tool In L sidelying to R rhomboids, R upper traps, R levator, and R lateral cervical muscles with no tenderness today. therapist was able to use normal pressure without discomfort    02/07/23 Resisted isometric walkouts using dual cable cross machine with pt returning therapist demo as follows for R UE: flexion x 10 with 3 lbs, extension x 10 with 3 lbs, ER x 10 with 3 lbs v/c to keep elbow tucked, scapular retraction x 10 reps with 3 lbs with v/c for posture throughout Scapular stabilization on wall with ball tracing alphabet on wall with RUE with v/c for posture IASTM with wave tool In L sidelying to R rhomboids, R upper traps, R levator, and R lateral cervical muscles with multiple areas of muscle tightness noted and therapist unable to use much pressure but by end of session muscle tightness had improved greatly and therapist was able to use normal pressure without discomfort  Educated pt about using tennis ball on wall for self STM at home  02/02/23: Sidelying R shoulder abduction x 10 Sidelying R shoulder flexion x 10 Sidelying R shoulder ER with towel at elbow x 10 - v/c to keep elbow tucked Resisted isometric walkouts using dual cable cross machine with pt returning therapist demo as follows for R UE: flexion x 10 with 7 lbs, extension x 10 with 10 lbs, ER x 10 with 3 lbs v/c to keep elbow tucked Scapular stabilization on wall with ball x 10 reps in both directions Standing 1 arm row with 3 lb weight on L x 15 reps v/c to keep arm close to body Scapular retraction with red band x 20  reps  01/30/23 Standing single arm row x 10 with arm using other chair Standing single arm horizontal abduction x 10 Standing single arm shoulder shrug x 10 Ranger flexion x 6, abduction x 4 then some increased pain Supine pro/ret x 6 Pt reports doing most of these at home [place] anterior approach handout as follows seated in front of mirror: short neck, 5 diaphragmatic breaths,  bilateral axillary nodes, bilateral pectoral nodes, anterior chest, short neck, posterior neck moving fluid towards pathway aimed at lateral neck, then lateral and anterior neck moving fluid towards pathway aimed at lateral neck then retracing all steps. Had pt return demonstrate each step while therapist provided v/c and t/c for direction of skin stretch, pressure and sequence.   01/27/23:  Continued instructing pt using [place] anterior approach handout as follows seated in front of mirror: short neck, 5 diaphragmatic breaths, bilateral axillary nodes, bilateral pectoral nodes, anterior chest, short neck, posterior neck moving fluid towards pathway aimed at lateral neck, then lateral and anterior neck moving fluid towards pathway aimed at lateral neck then retracing all steps. Had pt return demonstrate each step while therapist provided v/c and t/c for direction of skin stretch, pressure and sequence.  Then supine additional work to the neck by PT  01/25/23:  Remeasured circumferences Instructed pt using [place] anterior approach handout as follows seated in front of mirror: short neck, 5 diaphragmatic breaths, bilateral axillary nodes, bilateral pectoral nodes, anterior chest, short neck, posterior neck moving fluid towards pathway aimed at lateral neck, then lateral and anterior neck moving fluid towards pathway aimed at lateral neck then retracing all steps. Educated pt in anatomy and physiology of the lymphatic system and had pt return demonstrate each step while therapist provided v/c and t/c for direction of skin stretch, pressure and sequence.  Assisted pt with re donning his compression garment at the end of the session  PATIENT EDUCATION:  Education details: lymphedema eduction and need for compression garment and MLD, wearing chip pack at least 4 hours/day Person educated: Patient Education method: Explanation Education comprehension: verbalized understanding  HOME EXERCISE  PROGRAM: Reviewed previously given post op HEP.- sidelying abduction AROM, flexion AROM, ER AROM, retractions Wear chip pack for at least 4 hours/day - don't tie too tightly  ASSESSMENT:  CLINICAL IMPRESSION: Pt tolerated lower weight well last time and was not too sore. continued STM to scapular and cervical musculare on R side with less tenderness today with most tightness noted Rt UT.   Pt will benefit from skilled therapeutic intervention to improve on the following deficits: decreased knowledge of condition, decreased knowledge of use of DME, decreased ROM, decreased strength, increased edema, increased fascial restrictions, impaired UE functional use, postural dysfunction, and pain  PT treatment/interventions: ADL/Self care home management, [phone]- PT Re-evaluation, 97110-Therapeutic exercises, 97530- Therapeutic activity, 97535- Self Care, [id number]- Manual therapy, 97760- Orthotic Fit/training, Joint mobilization, Manual lymph drainage, Scar mobilization, and Compression bandaging    GOALS: Goals reviewed with patient? Yes  LONG TERM GOALS:  (STG=LTG) GOALS Name Target Date  Goal status  1 Pt will demonstrate a return to baseline cervical ROM measurements and not demonstrate any signs or symptoms of lymphedema. Baseline: 02/28/23 INITIAL  2 Pt will be indepndent in self MLD for long term managemetn of lymphedema. 02/28/23 NEW  3 Pt will obtain a compression garment for long term management of lymphedema. 02/28/23 NEW  4 Pt will demonstrate 150 degrees of R shoulder flexion to allow him to reach overhead. 02/28/23 NEW  5  Pt will demonstrate 150 degrees of R shoulder abduction to allow him to reach out to the side. 02/28/23 NEW  6 Pt will be independent in a home exercise program for long term stretching and strengthening.  02/28/23 NEW        PLAN:  PT FREQUENCY/DURATION: 2x/wk for 6 wks  PLAN FOR NEXT SESSION:cont to instruct in neck MLD using Norton approach, STM to R neck and  shoulder,  continue R UE strengthening to help improve ROM and focus on posture   [facility]  [address], [address]  [place] [address]  [phone]   Scar massage You can begin gentle scar massage to you incision sites. Gently place one hand on the incision and move the skin (without sliding on the skin) in various directions. Do this for a few minutes and then you can gently massage either coconut oil or vitamin E cream into the scars.  Home exercise Program Continue doing the exercises you were given until you feel like you can do them without feeling any tightness at the end. It is best to do them for several months after completion of radiation since the effects of radiation continue past completion.   Walking Program Studies show that 30 minutes of walking per day (fast enough to elevate your heart rate) can significantly reduce the risk of a cancer recurrence. If you can't walk due to other medical reasons, we encourage you to find another activity you could do (like a stationary bike or water exercise).  Posture After treatment for head and neck cancer, people frequently sit with rounded shoulders and forward head posture because the front of the neck has become tight and it feels better. If you sit like this, you can become very tight and have pain in sitting or standing with good posture. Try to be aware of your posture and sit and stand up tall to heal properly.  Follow up PT: Please let you doctor know as soon as possible if you develop any swelling in your face or neck in the future. Lymphedema (swelling) can occur months after completion of radiation. The sooner you can let the doctor know, the sooner they can refer you back to PT. It is much easier to treat the swelling early on.   Idamae Lusher, PT 02/09/2023, 9:55 AM

## 2023-02-10 ENCOUNTER — Ambulatory Visit
Admission: RE | Admit: 2023-02-10 | Discharge: 2023-02-10 | Disposition: A | Discharge status: Home / Self Care | Payer: Medicare HMO | Source: Ambulatory Visit | Attending: Radiation Oncology | Admitting: Radiation Oncology

## 2023-02-10 ENCOUNTER — Encounter: Payer: Self-pay | Admitting: Radiation Oncology

## 2023-02-10 VITALS — BP 146/76 | HR 69 | Temp 97.7°F | Resp 20 | Ht 70.0 in | Wt 197.0 lb

## 2023-02-10 DIAGNOSIS — Z7985 Long-term (current) use of injectable non-insulin antidiabetic drugs: Secondary | ICD-10-CM | POA: Diagnosis not present

## 2023-02-10 DIAGNOSIS — I2510 Atherosclerotic heart disease of native coronary artery without angina pectoris: Secondary | ICD-10-CM | POA: Diagnosis not present

## 2023-02-10 DIAGNOSIS — I890 Lymphedema, not elsewhere classified: Secondary | ICD-10-CM | POA: Insufficient documentation

## 2023-02-10 DIAGNOSIS — M542 Cervicalgia: Secondary | ICD-10-CM | POA: Insufficient documentation

## 2023-02-10 DIAGNOSIS — Z79899 Other long term (current) drug therapy: Secondary | ICD-10-CM | POA: Insufficient documentation

## 2023-02-10 DIAGNOSIS — Z7982 Long term (current) use of aspirin: Secondary | ICD-10-CM | POA: Diagnosis not present

## 2023-02-10 DIAGNOSIS — R682 Dry mouth, unspecified: Secondary | ICD-10-CM | POA: Diagnosis not present

## 2023-02-10 DIAGNOSIS — I700 Atherosclerosis of aorta: Secondary | ICD-10-CM | POA: Insufficient documentation

## 2023-02-10 DIAGNOSIS — Z791 Long term (current) use of non-steroidal anti-inflammatories (NSAID): Secondary | ICD-10-CM | POA: Insufficient documentation

## 2023-02-10 DIAGNOSIS — C12 Malignant neoplasm of pyriform sinus: Secondary | ICD-10-CM | POA: Diagnosis present

## 2023-02-10 DIAGNOSIS — Z7984 Long term (current) use of oral hypoglycemic drugs: Secondary | ICD-10-CM | POA: Insufficient documentation

## 2023-02-10 MED ORDER — OXYMETAZOLINE HCL 0.05 % NA SOLN
1.0000 | Freq: Two times a day (BID) | NASAL | Status: DC
  Administered 2023-02-10: 1 via NASAL
  Filled 2023-02-10: qty 30

## 2023-02-10 MED ORDER — HYDROCODONE-ACETAMINOPHEN 7.5-325 MG/15ML PO SOLN: ORAL | 0 refills | Status: DC

## 2023-02-10 MED ORDER — SUCRALFATE 1 G PO TABS: ORAL_TABLET | ORAL | 3 refills | Status: DC

## 2023-02-10 NOTE — Progress Notes (Signed)
Radiation Oncology         [phone] ________________________________  Name: Randall [name] MRN: [id number]  Date: 02/10/2023  DOB: 1955-10-28  Follow-Up Visit Note  CC: Barbie Banner, MD  Barbie Banner, MD  Diagnosis and Prior Radiotherapy:       ICD-10-CM   1. 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 presents today for a follow up after completing radiation to his hypopharynx/supraglottis on 11/18/2022 Here to receive results from recent scan  Pain issues, if any: Yes, reports neck pain, rating 8/10 still needing narcotics to help with this.  His throat is still sore but the neck pain is the main concern.  This is on the right side of his neck.  He also reports that he is having right shoulder problems extending from his neck in which she is having trouble lifting his right arm.  He has addressed this with Dr. Hezzie Bump and it was a problem that became apparent immediately after surgery.  Using a feeding tube?: No Weight changes, if any: Yes Wt Readings from Last 3 Encounters:  02/10/23 197 lb (89.4 kg)  01/12/23 192 lb 6.4 oz (87.3 kg)  12/21/22 184 lb 1.3 oz  (83.5 kg)     Swallowing issues, if any: No,, reports certain foods causes coughing.  However he was recently able to eat solid and a hamburger and the food actually tasted pretty good.  He continues to follow with swallowing therapy. Smoking or chewing tobacco? No Using fluoride toothpaste daily? Yes and he is using the supplement.  He is seeing his dentist for cleanings.  He does report a dry mouth and is using supplementary products to help with this Last ENT visit was on: Dr. Hezzie Bump- 12/28/22. Patient to follow up in March.    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 Patient currently in PT. he also reports that his sinuses are acting up but he cannot tolerate Sudafed due to high blood pressure.  He is seeing a cardiologist as he has been having lower extremity swelling.  He has a new PCP who is managing his diabetes.  He reports that he continues to have coughing fits and takes Robitussin for this but it sometimes burns when he swallows it  BP (!) 146/76 (BP Location: Left Arm, Patient Position: Sitting, Cuff Size: Normal)   Pulse 69   Temp 97.7 F (36.5 C)   Resp 20   Ht 5\' 10"  (1.778 m)   Wt 197 lb (89.4 kg)   SpO2 99%   BMI 28.27 kg/m         ALLERGIES:  is allergic to norgesic forte [orphenadrine-aspirin-caffeine] and latex.  Meds: Current Outpatient Medications  Medication  Sig Dispense Refill   sucralfate (CARAFATE) 1 g tablet Dissolve 1 tablet in 10 mL H20 and swallow 30 min prior to meals and bedtime PRN sore throat. 40 tablet 3   amLODipine (NORVASC) 10 MG tablet Take 10 mg by mouth daily.     aspirin EC 81 MG tablet Take 81 mg by mouth daily. Swallow whole.     atorvastatin (LIPITOR) 20 MG tablet Take 20 mg by mouth daily.     carvedilol (COREG) 25 MG tablet Take 25 mg by mouth 2 (two) times daily with a meal.     cetirizine (ZYRTEC) 10 MG tablet Take 10 mg by mouth daily as needed for allergies. 24 hour     chlorthalidone (HYGROTON) 25 MG tablet TAKE 1/2  TABLET BY MOUTH DAILY 45 tablet 3   fentaNYL (DURAGESIC) 25 MCG/HR Place 1 patch onto the skin every 3 (three) days. 5 patch 0   furosemide (LASIX) 40 MG tablet Take 1 tablet (40 mg total) by mouth daily. 90 tablet 3   glimepiride (AMARYL) 4 MG tablet Take 2 mg by mouth daily with breakfast.     glucose blood (FREESTYLE LITE) test strip Test glucose BID   Diagnoses Code: E11.9     HYDROcodone-acetaminophen (HYCET) 7.5-325 mg/15 ml solution Take 10-15 mL every three hours as needed for pain 600 mL 0   LORazepam (ATIVAN) 0.5 MG tablet Take 1 tablet (0.5 mg total) by mouth every 8 (eight) hours. Take as needed for aversion to feedings. 30 tablet 0   meloxicam (MOBIC) 15 MG tablet Take 15 mg by mouth daily.     metFORMIN (GLUCOPHAGE-XR) 500 MG 24 hr tablet Take 1,000 mg by mouth 2 (two) times daily.     ondansetron (ZOFRAN) 8 MG tablet Take 1 tablet (8 mg total) by mouth every 8 (eight) hours as needed for nausea or vomiting. 20 tablet 2   OVER THE COUNTER MEDICATION Take 2 capsules by mouth daily. xEO Mega Complex     OVER THE COUNTER MEDICATION Take 2 capsules by mouth daily. Alpha crs plus supplement     OVER THE COUNTER MEDICATION Take 2 capsules by mouth daily. Microplex vmz supplement     pantoprazole (PROTONIX) 40 MG tablet Take 40 mg by mouth daily.     potassium citrate (UROCIT-K) 10 MEQ (1080 MG) SR tablet Take 10 mEq by mouth 2 (two) times daily.     sodium fluoride (FLUORISHIELD) 1.1 % GEL dental gel Place 1 Application onto teeth at bedtime.     tadalafil (CIALIS) 20 MG tablet Take 20 mg by mouth daily as needed for erectile dysfunction.     telmisartan (MICARDIS) 80 MG tablet Take 80 mg by mouth daily.     triamcinolone cream (KENALOG) 0.1 % Apply 1 application topically 3 (three) times daily as needed for itching (rash).     TRULICITY 1.5 MG/0.5ML SOPN Inject 1.5 mg as directed once a week. Thursday morning     Current Facility-Administered Medications  Medication Dose Route  Frequency Provider Last Rate Last Admin   oxymetazoline (AFRIN) 0.05 % nasal spray 1 spray  1 spray Each Nare BID Lonie Peak, MD   1 spray at 02/10/23 1159    Physical Findings: The patient is in no acute distress. Patient is alert and oriented. Wt Readings from Last 3 Encounters:  02/10/23 197 lb (89.4 kg)  01/12/23 192 lb 6.4 oz (87.3 kg)  12/21/22 184 lb 1.3 oz (83.5 kg)    height is 5'  10" (1.778 m) and weight is 197 lb (89.4 kg). His temperature is 97.7 F (36.5 C). His blood pressure is 146/76 (abnormal) and his pulse is 69. His respiration is 20 and oxygen saturation is 99%. .  General: Alert and oriented, in no acute distress HEENT: Head is normocephalic. Extraocular movements are intact. Oropharynx clear and mouth is dry Neck: Neck is notable for no palpable masses.  Moderate lymphedema. Skin: Skin in treatment fields shows satisfactory healing over neck. Chest: Normal respiratory effort.  Clear to auscultation early Heart is regular in rate and rhythm with a soft systolic murmur.  Patient notified so he can talk to his cardiologist about this Abdomen: Soft, nontender, nondistended, with no rigidity or guarding.  No feeding tube. Extremities: He is wearing lower extremity compression socks with no obvious edema Lymphatics: see Neck Exam Psychiatric: Judgment and insight are intact. Affect is appropriate.  PROCEDURE NOTE: After obtaining consent and spraying nasal cavity with topical lidocaine and oxymetazoline, the flexible endoscope was coated with lidocaine gel and introduced and passed through the nasal cavity.  The nasopharynx, oropharynx, hypopharynx, and larynx were then examined. No lesions /no tumor appreciated in the mucosal axis.  He has an atrophic epiglottis.  The true cords were symmetrically mobile.  The patient tolerated the procedure well.  Lab Findings: Lab Results  Component Value Date   WBC 8.5 06/17/2022   HGB 15.3 06/17/2022   HCT 43.6 06/17/2022   MCV  91.8 06/17/2022   PLT 187 06/17/2022    Lab Results  Component Value Date   TSH 2.038 09/30/2022   CMP     Component Value Date/Time   NA 135 01/19/2023 1014   K 4.0 01/19/2023 1014   CL 96 01/19/2023 1014   CO2 24 01/19/2023 1014   GLUCOSE 111 (H) 01/19/2023 1014   GLUCOSE 166 (H) 12/19/2022 0750   BUN 17 01/19/2023 1014   CREATININE 1.05 01/19/2023 1014   CREATININE 1.16 12/19/2022 0750   CALCIUM 8.7 01/19/2023 1014   PROT 6.0 03/07/2010 0515   ALBUMIN 2.2 (L) 03/07/2010 0515   AST 23 03/07/2010 0515   ALT 34 03/07/2010 0515   ALKPHOS 83 03/07/2010 0515   BILITOT 1.1 03/07/2010 0515   EGFR 78 01/19/2023 1014   GFRNONAA >60 12/19/2022 0750    Radiographic Findings: CT Soft Tissue Neck W Contrast Result Date: 02/10/2023 CLINICAL DATA:  Head neck cancer follow-up. History of puriform sinus cancer EXAM: CT NECK WITH CONTRAST TECHNIQUE: Multidetector CT imaging of the neck was performed using the standard protocol following the bolus administration of intravenous contrast. RADIATION DOSE REDUCTION: This exam was performed according to the departmental dose-optimization program which includes automated exposure control, adjustment of the mA and/or kV according to patient size and/or use of iterative reconstruction technique. CONTRAST:  80mL OMNIPAQUE IOHEXOL 300 MG/ML  SOLN COMPARISON:  06/21/2022 at outside facility FINDINGS: Pharynx and larynx: Submucosal low-density thickening and parapharyngeal fat stranding attributed to treatment. No focal masslike enhancement or thickening. Salivary glands: No inflammation, mass, or stone. Thyroid: Normal. Lymph nodes: Bilateral neck dissection with non masslike architectural distortion best attributed to scarring. No enlarged or heterogeneous lymph node when compared to prior. Vascular: Atheromatous calcification of the cervical carotids. Limited intracranial: Negative Visualized orbits: Negative Mastoids and visualized paranasal sinuses: Clear  Skeleton: No acute finding. T3 superior endplate fracture that is chronic and healed. Upper chest: Chest CT reported separately IMPRESSION: Post treatment neck with no evidence of recurrent disease. Electronically Signed   By:  Tiburcio Pea M.D.   On: 02/10/2023 09:02   CT Chest W Contrast Result Date: 02/09/2023 CLINICAL DATA:  Head and neck cancer. Evaluate treatment response. * Tracking Code: BO * EXAM: CT CHEST WITH CONTRAST TECHNIQUE: Multidetector CT imaging of the chest was performed during intravenous contrast administration. RADIATION DOSE REDUCTION: This exam was performed according to the departmental dose-optimization program which includes automated exposure control, adjustment of the mA and/or kV according to patient size and/or use of iterative reconstruction technique. CONTRAST:  80mL OMNIPAQUE IOHEXOL 300 MG/ML  SOLN COMPARISON:  07/07/2022 PET.  Cardiac CT of 02/02/2021 FINDINGS: Cardiovascular: Aortic atherosclerosis. Tortuous thoracic aorta. Normal heart size, without pericardial effusion. Lad coronary artery calcification. Mediastinum/Nodes: No mediastinal or hilar adenopathy. Lungs/Pleura: No pleural fluid. Right lower lobe calcified granuloma of 3 mm. Minimal right upper lobe peribronchovascular ground-glass including on 57/4, new since 02/02/2021 cardiac CT. inferior right middle lobe subtle dependent ground-glass opacities are also new since 02/02/2021. Tiny left upper and left lower lobe calcified granulomas. The left lower lobe calcified nodule corresponds to the PET abnormality. No suspicious pulmonary nodule to suggest metastasis. Upper Abdomen: Normal imaged portions of the liver, spleen, stomach, pancreas, adrenal glands, kidneys. Musculoskeletal: Mild superior endplate compression deformity at T3. IMPRESSION: 1.  No acute process or evidence of metastatic disease in the chest. 2. Areas of right upper and right middle lobe subtle ground-glass are new since 02/02/2021, likely  minimal infection of indeterminate acuity. 3. Incidental findings, including: Coronary artery atherosclerosis. Aortic Atherosclerosis (ICD10-I70.0). Electronically Signed   By: Jeronimo Greaves M.D.   On: 02/09/2023 18:27   ECHOCARDIOGRAM COMPLETE Result Date: 02/09/2023    ECHOCARDIOGRAM REPORT   Patient Name:   Randall [name] Date of Exam: 02/09/2023 Medical Rec #:  [id number]        Height:       70.0 in Accession #:    [id number]       Weight:       192.4 lb Date of Birth:  [birth year]        BSA:          2.053 m Patient Age:    56 years         BP:           132/74 mmHg Patient Gender: M                HR:           64 bpm. Exam Location:  Outpatient Procedure: 2D Echo, 3D Echo, Cardiac Doppler, Color Doppler and Strain Analysis Indications:    R60.0 Lower extremity edema; I45.10 RBBB  History:        Patient has prior history of Echocardiogram examinations, most                 recent 12/08/2020. CHF, CAD, Arrythmias:RBBB,                 Signs/Symptoms:Edema; Risk Factors:Hypertension, Dyslipidemia                 and Non-Smoker. Patient denies chest pain and SOB. He does have                 bilateral leg edema. He had throat cancer with radiation only.  Sonographer:    Carlos American RVT, RDCS (AE), RDMS Referring Phys: [id number] EMILY C MONGE IMPRESSIONS  1. Left ventricular ejection fraction, by estimation, is 55 to 60%. Left ventricular ejection fraction by 3D volume is 57 %.  The left ventricle has normal function. Left ventricular endocardial border not optimally defined to evaluate regional wall motion. Left ventricular diastolic parameters are consistent with Grade II diastolic dysfunction (pseudonormalization). The average left ventricular global longitudinal strain is -17.1 %. The global longitudinal strain is abnormal.  2. Right ventricular systolic function is normal. The right ventricular size is moderately enlarged. Tricuspid regurgitation signal is inadequate for assessing PA pressure.  3. Left  atrial size was severely dilated.  4. The mitral valve is normal in structure. No evidence of mitral valve regurgitation. No evidence of mitral stenosis.  5. The aortic valve is tricuspid. There is mild calcification of the aortic valve. There is mild thickening of the aortic valve. Aortic valve regurgitation is not visualized. No aortic stenosis is present.  6. The inferior vena cava is dilated in size with >50% respiratory variability, suggesting right atrial pressure of 8 mmHg. Comparison(s): EF 45%, global hypokinesis; LV function has improved. FINDINGS  Left Ventricle: Left ventricular ejection fraction, by estimation, is 55 to 60%. Left ventricular ejection fraction by 3D volume is 57 %. The left ventricle has normal function. Left ventricular endocardial border not optimally defined to evaluate regional wall motion. The average left ventricular global longitudinal strain is -17.1 %. The global longitudinal strain is abnormal. The left ventricular internal cavity size was normal in size. There is no left ventricular hypertrophy. Left ventricular  diastolic parameters are consistent with Grade II diastolic dysfunction (pseudonormalization). Right Ventricle: The right ventricular size is moderately enlarged. No increase in right ventricular wall thickness. Right ventricular systolic function is normal. Tricuspid regurgitation signal is inadequate for assessing PA pressure. Left Atrium: Left atrial size was severely dilated. Right Atrium: Right atrial size was normal in size. Pericardium: There is no evidence of pericardial effusion. Mitral Valve: The mitral valve is normal in structure. No evidence of mitral valve regurgitation. No evidence of mitral valve stenosis. Tricuspid Valve: The tricuspid valve is normal in structure. Tricuspid valve regurgitation is not demonstrated. No evidence of tricuspid stenosis. Aortic Valve: The aortic valve is tricuspid. There is mild calcification of the aortic valve. There is  mild thickening of the aortic valve. Aortic valve regurgitation is not visualized. No aortic stenosis is present. Aortic valve mean gradient measures 4.5 mmHg. Aortic valve peak gradient measures 8.9 mmHg. Aortic valve area, by VTI measures 3.39 cm. Pulmonic Valve: The pulmonic valve was not well visualized. Pulmonic valve regurgitation is not visualized. Aorta: The aortic root and ascending aorta are structurally normal, with no evidence of dilitation. Venous: The inferior vena cava is dilated in size with greater than 50% respiratory variability, suggesting right atrial pressure of 8 mmHg. IAS/Shunts: The atrial septum is grossly normal.  LEFT VENTRICLE PLAX 2D LVIDd:         4.53 cm         Diastology LVIDs:         3.05 cm         LV e' medial:    5.68 cm/s LV PW:         1.25 cm         LV E/e' medial:  18.5 LV IVS:        0.66 cm         LV e' lateral:   9.96 cm/s LVOT diam:     2.36 cm         LV E/e' lateral: 10.5 LV SV:         111 LV SV Index:  54              2D LVOT Area:     4.37 cm        Longitudinal                                Strain                                2D Strain GLS  -16.2 % LV Volumes (MOD)               (A2C): LV vol d, MOD    115.0 ml      2D Strain GLS  -15.6 % A4C:                           (A3C): LV vol s, MOD    53.8 ml       2D Strain GLS  -19.4 % A4C:                           (A4C): LV SV MOD A4C:   115.0 ml      2D Strain GLS  -17.1 %                                Avg:                                 3D Volume EF                                LV 3D EF:    Left                                             ventricul                                             ar                                             ejection                                             fraction                                             by 3D  volume is                                             57 %.                                 3D Volume EF:                                 3D EF:        57 %                                LV EDV:       205 ml                                LV ESV:       87 ml                                LV SV:        117 ml RIGHT VENTRICLE RV S prime:     12.10 cm/s TAPSE (M-mode): 3.5 cm LEFT ATRIUM              Index        RIGHT ATRIUM           Index LA diam:        3.77 cm  1.84 cm/m   RA Area:     19.20 cm LA Vol (A2C):   142.0 ml 69.16 ml/m  RA Volume:   48.20 ml  23.48 ml/m LA Vol (A4C):   77.5 ml  37.75 ml/m LA Biplane Vol: 108.0 ml 52.60 ml/m  AORTIC VALVE                    PULMONIC VALVE AV Area (Vmax):    3.22 cm     PV Vmax:       1.10 m/s AV Area (Vmean):   3.24 cm     PV Peak grad:  4.8 mmHg AV Area (VTI):     3.39 cm AV Vmax:           149.50 cm/s AV Vmean:          96.300 cm/s AV VTI:            0.326 m AV Peak Grad:      8.9 mmHg AV Mean Grad:      4.5 mmHg LVOT Vmax:         110.00 cm/s LVOT Vmean:        71.300 cm/s LVOT VTI:          0.253 m LVOT/AV VTI ratio: 0.78  AORTA Ao Root diam: 3.54 cm Ao Asc diam:  3.84 cm Ao Arch diam: 3.8 cm MITRAL VALVE MV Area (PHT): 2.68 cm     SHUNTS MV Decel Time: 283 msec     Systemic VTI:  0.25 m MV E velocity: 105.00 cm/s  Systemic Diam: 2.36 cm MV A velocity: 99.50 cm/s MV E/A ratio:  1.06 Mahesh  Izora Ribas MD Electronically signed by Riley Lam MD Signature Date/Time: 02/09/2023/12:54:45 PM    Final     Impression/Plan:    1) Head and Neck Cancer Status: I am glad to see that his oral intake and his nutritional status have greatly improved since I last saw him.  However he is still frustrated by lingering side effects.  He has had a rough time healing since completing radiation.  Fortunately his laryngoscope, physical exam, and CT imaging showed that he is in remission.  After celebrating this we talked about his symptoms:  A) coughing: He will continue Robitussin but it burns and therefore I recommended that he dilute it heavily before swallowing  B)  challenges with oral intake and sore throat: He will modify his diet as tolerated and we will try Carafate to soothe his throat further.  Prescription placed today.  His nutrition, swallowing and eating have improved quite a bit overall  C) sinus issues: He will try loratadine and Neti Pot  D) dry mouth: I outlined different types of over-the-counter supplements that he may want to try in addition to the lozenges and spray that he is using.  He will try an artificial saliva gel and can also try XyliMelts.  He will continue to follow closely with his dentist as dry mouth can heighten the risk of cavities   E) right shoulder issues: This is related to his surgery and he will continue to follow with Dr. Hezzie Bump to measure his progress.  He will also continue physical therapy  F) lymphedema: Continue to follow with physical therapy and use compression garment  G) severe neck pain: Refilled Hycet today and talked about how to taper as tolerated.  Imaging and exam are reassuring  H) frustrations regarding communication with our clinic: I gave him the direct extension for Israa, the nurse on our service -he will call her first with needs and concerns.  He can also call Victorino Dike our navigator 2) Nutritional Status: As above PEG tube: None Wt Readings from Last 3 Encounters:  02/10/23 197 lb (89.4 kg)  01/12/23 192 lb 6.4 oz (87.3 kg)  12/21/22 184 lb 1.3 oz (83.5 kg)     2) Risk Factors: The patient has been educated about risk factors including alcohol and tobacco abuse; they understand that avoidance of alcohol and tobacco is important to prevent recurrences as well as other cancers  3) Swallowing: Improving, see above, continue speech-language pathology appointments  4) Dental: Encouraged to continue regular followup with dentistry, and dental hygiene including fluoride treatments.  We discussed ways to manage his dry mouth as above  5) Thyroid function: Check annually -I have asked nursing to  arrange this for his next follow-up in May Lab Results  Component Value Date   TSH 2.038 09/30/2022   6) soft systolic heart murmur auscultated today.  I let him know and advised that he talk about this with his cardiologist  7) F/u in May for TSH lab and continued monitoring. The patient was encouraged to call with any issues or questions before then.  On date of service, in total, I spent 60 minutes on this encounter. Patient was seen in person. _____________________________________   Lonie Peak, MD

## 2023-02-14 ENCOUNTER — Ambulatory Visit: Payer: Medicare HMO | Attending: Radiation Oncology | Admitting: Physical Therapy

## 2023-02-14 ENCOUNTER — Encounter: Payer: Self-pay | Admitting: Physical Therapy

## 2023-02-14 DIAGNOSIS — I890 Lymphedema, not elsewhere classified: Secondary | ICD-10-CM | POA: Insufficient documentation

## 2023-02-14 DIAGNOSIS — R293 Abnormal posture: Secondary | ICD-10-CM | POA: Insufficient documentation

## 2023-02-14 DIAGNOSIS — M25611 Stiffness of right shoulder, not elsewhere classified: Secondary | ICD-10-CM | POA: Insufficient documentation

## 2023-02-14 DIAGNOSIS — M25511 Pain in right shoulder: Secondary | ICD-10-CM | POA: Diagnosis present

## 2023-02-14 DIAGNOSIS — C12 Malignant neoplasm of pyriform sinus: Secondary | ICD-10-CM | POA: Diagnosis present

## 2023-02-14 NOTE — Therapy (Signed)
 OUTPATIENT PHYSICAL THERAPY HEAD AND NECK POST RADIATION TREATMENT   Patient Name: Randall [name] MRN: [id number] DOB:1955-08-24, 68 y.o., male Today's Date: 02/14/2023  END OF SESSION:  PT End of Session - 02/14/23 1106     Visit Number 8    Number of Visits 13    Date for PT Re-Evaluation 02/28/23    PT Start Time 1105    PT Stop Time 1146    PT Time Calculation (min) 41 min    Activity Tolerance Patient tolerated treatment well    Behavior During Therapy [facility] for tasks assessed/performed                Past Medical History:  Diagnosis Date   Complication of anesthesia    Diabetes mellitus without complication (HCC)    Elevated coronary artery calcium  score 02/02/2021   GERD (gastroesophageal reflux disease)    History of kidney stones 2014   Hypertension    Kidney stone 06/17/2015   PONV (postoperative nausea and vomiting)    Rupture of right quadriceps tendon 09/21/2020   - s/p srugical repair   Tuberculosis 1980   Type 2 diabetes mellitus without complication, without long-term current use of insulin  (HCC) 03/21/2020   Past Surgical History:  Procedure Laterality Date   KIDNEY SURGERY     KNEE SURGERY     LITHOTRIPSY     MICROLARYNGOSCOPY Bilateral 06/17/2022   Procedure: DIRECT LARYNGOSCOPY WITH BIOPSY;  Surgeon: Llewellyn Gerard LABOR, DO;  Location: [facility] OR;  Service: ENT;  Laterality: Bilateral;   QUADRICEPS TENDON REPAIR Right 09/25/2020   Procedure: REPAIR RIGHT QUADRICEP TENDON;  Surgeon: Harden Jerona GAILS, MD;  Location: [facility] OR;  Service: Orthopedics;  Laterality: Right;   TONSILLECTOMY     Patient Active Problem List   Diagnosis Date Noted   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   Hyperlipidemia LDL goal <70 02/21/2022   Elevated coronary artery calcium  score 01/26/2021   Thoracic aortic atherosclerosis (HCC) 01/26/2021   Decreased left ventricular systolic function 12/25/2020   Resistant hypertension 11/13/2020   Right  bundle branch block 11/13/2020   Hypertensive heart disease with chronic diastolic congestive heart failure (HCC) 11/13/2020   Quadriceps tendon rupture, right, sequela     PCP: Prentice Blush, MD  REFERRING PROVIDER: Lauraine Golden, MD  REFERRING DIAG: C12 (ICD-10-CM) - Pyriform sinus cancer [facility])   THERAPY DIAG:  Lymphedema, not elsewhere classified  Stiffness of right shoulder, not elsewhere classified  Acute pain of right shoulder  Abnormal posture  Pyriform sinus cancer (HCC)  Rationale for Evaluation and Treatment: Rehabilitation  ONSET DATE: 06/17/22  SUBJECTIVE:  SUBJECTIVE STATEMENT: I was throwing up all night. I had food poisoning. When I wake up my throat is very swollen. I had to take the garment off last night because I was throwing up.   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 He presented with bilateral neck swelling in Jan 2024. He was also diagnosed with COVID around that time and he attributed his symptoms to that. The right neck swelling continued and he presented to his PCP in April 2024 for further evaluation. 05/05/22 A soft tissue neck US  demonstrated a nonspecific 3.2 x 1.5 x 2.5 cm hypoechoic lesion in the right neck, correlating with the palpable site of concern. 05/25/22 CT neck demonstrated a soft tissue mass centered within the right piriform sinus, measuring up to 22 x 8 mm, concerning for malignancy. CT also demonstrated a right level 3 lymph node measuring up to 2.8 x 1.8 cm, with central necrosis (correlating with the hypoechoic mass seen on neck ultrasound) suspicious for metastatic disease, and indeterminate asymmetric prominence of right level 2A lymph node, measuring up to 16 x 10 mm. 06/14/22 He saw Dr.  Llewellyn. Laryngoscopy performed during this visit revealed a mass lesion of the right piriform sinus which appeared to extend in to the arytenoid and partially onto the area epiglottic fold. 06/17/22 He underwent direct laryngoscopy to obtain biopsies. Biopsy of the right pyriform sinus showed findings consistent with invasive poorly differentiated squamous cell carcinoma (nonkeratinizing). A post cricoid biopsy was also collected and showed no evidence of malignancy. 06/28/22 He met with Dr. Lauralee to discuss treatment options- chemo/radiation vs surgery with possible post op radiation. 07/07/22 PET demonstrated the right piriform sinus primary measuring 1.4 cm with an SUV max of 5.9, and the known level 3 ipsilateral nodal metastasis measuring 2.7 cm with an SUV max of 3.2. PET otherwise showed no evidence of extracervical hypermetabolic metastatic disease, and a nonspecific 2 mm LLL pulmonary nodule. He met with Dr. Loretha on 6/28 to discuss the option of chemotherapy but ultimately decided to proceed with surgery. 08/26/22 endoscopic CO2 laser excision of the hypopharyngeal mass and bilateral neck dissection by Dr. Lauralee. Completed radiation 11/8.   PATIENT GOALS:  Reassess how my recovery is going related to neck ROM, cervical pain, fatigue, and swelling.  PAIN:  Are you having pain? Yes NPRS scale: 9/10 Pain location: R neck and shoulder Pain orientation: Right  PAIN TYPE: tight, just hurts Pain description: intermittent  Aggravating factors: trying to use it Relieving factors: pain medication  PRECAUTIONS: Recent radiation, Head and neck lymphedema risk,    OBJECTIVE:   POSTURE:  Forward head and rounded shoulders posture   30 SEC SIT TO STAND: 01/17/23: injured R knee when he fell so not tested today At eval on 10/13/22- 14 reps in 30 sec without use of UEs which is  Average for patient's age   SHOULDER AROM:   At eval: Impaired  L shoulder ROM is WFL, R shoulder ROM is less than 50%  of WNL  UPPER EXTREMITY AROM/PROM:  A/PROM RIGHT   eval  01/30/23 02/14/23  Shoulder extension 49    Shoulder flexion 94 115 111  Shoulder abduction 56 70 72  Shoulder internal rotation     Shoulder external rotation       (Blank rows = not tested)  A/PROM LEFT   eval  Shoulder extension 69  Shoulder flexion 157  Shoulder abduction 170  Shoulder internal rotation  Shoulder external rotation     (Blank rows = not tested)     CERVICAL AROM:     Percent limited 10/13/22 at eval 01/17/23  Flexion [facility] WFL  Extension 25% limited 25% limited  Right lateral flexion 50% limited 25% limited  Left lateral flexion [facility] WFL  Right rotation 25% limited WFL  Left rotation 25% limited WFL                          (Blank rows=not tested)  LYMPHEDEMA ASSESSMENT:    Circumference in cm 01/25/23  4 cm superior to sternal notch around neck 40.6 40.5  6 cm superior to sternal notch around neck 42.3 41.5  8 cm superior to sternal notch around neck 43.3 42.5  R lateral nostril from base of nose to medial tragus    L lateral nostril from base of nose to medial tragus    R corner of mouth to where ear lobe meets face    L corner of mouth to where ear lobe meets face            (Blank rows=not tested)  CURRENT/PAST TREATMENTS:  Surgery type/date: 08/26/22 bilateral neck dissection   Chemotherapy: did not do  Radiation: completed 11/18/22  OTHER SYMPTOMS: Pain Yes Fibrosis No Pitting edema No Infections No Decreased scar mobility Yes  TREATMENT PERFORMED: 02/14/23 Therapeutic Exercise:  Pulleys into flexion x and scaption x v/c for slow and controlled movement and to avoid compensatory strategies  Resisted isometric walkouts using dual cable cross machine with pt returning therapist demo as follows for R UE: flexion x 10 with 3 lbs, extension x 10 with 3 lbs, ER x 10 with 3 lbs v/c to keep elbow tucked, scapular retraction x 10 reps with 3 lbs with v/c for posture  throughout Scapular stabilization on wall with green weighted ball tracing alphabet on wall with RUE with v/c for posture Attempted exercise supine over foam roll but pt unable to tolerate due to back pain Supine on large mat: alternating shoulder flexion with increased stretch felt across chest, lying with UEs in abduction for pec stretch, snow angels but in limited range due to tightness with holds at end range - encouraged pt to begin to do these at home due to increased pec tightness   02/09/23 Therapeutic Exercise:  Pulleys into flexion x and scaption x with initial instruction and cueing for ROM and warm up Resisted isometric walkouts using dual cable cross machine with pt returning therapist demo as follows for R UE: flexion x 10 with 3 lbs, extension x 10 with 3 lbs, ER x 10 with 3 lbs v/c to keep elbow tucked, scapular retraction x 10 reps with 3 lbs with v/c for posture throughout Scapular stabilization on wall with ball tracing alphabet on wall with RUE with v/c for posture IASTM with wave tool In L sidelying to R rhomboids, R upper traps, R levator, and R lateral cervical muscles with no tenderness today. therapist was able to use normal pressure without discomfort    02/07/23 Resisted isometric walkouts using dual cable cross machine with pt returning therapist demo as follows for R UE: flexion x 10 with 3 lbs, extension x 10 with 3 lbs, ER x 10 with 3 lbs v/c to keep elbow tucked, scapular retraction x 10 reps with 3 lbs with v/c for posture throughout Scapular stabilization on wall with ball tracing alphabet on wall with RUE with  v/c for posture IASTM with wave tool In L sidelying to R rhomboids, R upper traps, R levator, and R lateral cervical muscles with multiple areas of muscle tightness noted and therapist unable to use much pressure but by end of session muscle tightness had improved greatly and therapist was able to use normal pressure without discomfort  Educated pt  about using tennis ball on wall for self STM at home  02/02/23: Sidelying R shoulder abduction x 10 Sidelying R shoulder flexion x 10 Sidelying R shoulder ER with towel at elbow x 10 - v/c to keep elbow tucked Resisted isometric walkouts using dual cable cross machine with pt returning therapist demo as follows for R UE: flexion x 10 with 7 lbs, extension x 10 with 10 lbs, ER x 10 with 3 lbs v/c to keep elbow tucked Scapular stabilization on wall with ball x 10 reps in both directions Standing 1 arm row with 3 lb weight on L x 15 reps v/c to keep arm close to body Scapular retraction with red band x 20 reps  01/30/23 Standing single arm row x 10 with arm using other chair Standing single arm horizontal abduction x 10 Standing single arm shoulder shrug x 10 Ranger flexion x 6, abduction x 4 then some increased pain Supine pro/ret x 6 Pt reports doing most of these at home [place] anterior approach handout as follows seated in front of mirror: short neck, 5 diaphragmatic breaths, bilateral axillary nodes, bilateral pectoral nodes, anterior chest, short neck, posterior neck moving fluid towards pathway aimed at lateral neck, then lateral and anterior neck moving fluid towards pathway aimed at lateral neck then retracing all steps. Had pt return demonstrate each step while therapist provided v/c and t/c for direction of skin stretch, pressure and sequence.   01/27/23:  Continued instructing pt using [place] anterior approach handout as follows seated in front of mirror: short neck, 5 diaphragmatic breaths, bilateral axillary nodes, bilateral pectoral nodes, anterior chest, short neck, posterior neck moving fluid towards pathway aimed at lateral neck, then lateral and anterior neck moving fluid towards pathway aimed at lateral neck then retracing all steps. Had pt return demonstrate each step while therapist provided v/c and t/c for direction of skin stretch, pressure and sequence.  Then supine  additional work to the neck by PT  01/25/23:  Remeasured circumferences Instructed pt using [place] anterior approach handout as follows seated in front of mirror: short neck, 5 diaphragmatic breaths, bilateral axillary nodes, bilateral pectoral nodes, anterior chest, short neck, posterior neck moving fluid towards pathway aimed at lateral neck, then lateral and anterior neck moving fluid towards pathway aimed at lateral neck then retracing all steps. Educated pt in anatomy and physiology of the lymphatic system and had pt return demonstrate each step while therapist provided v/c and t/c for direction of skin stretch, pressure and sequence.  Assisted pt with re donning his compression garment at the end of the session  PATIENT EDUCATION:  Education details: lymphedema eduction and need for compression garment and MLD, wearing chip pack at least 4 hours/day Person educated: Patient Education method: Explanation Education comprehension: verbalized understanding  HOME EXERCISE PROGRAM: Reviewed previously given post op HEP.- sidelying abduction AROM, flexion AROM, ER AROM, retractions Wear chip pack for at least 4 hours/day - don't tie too tightly  ASSESSMENT:  CLINICAL IMPRESSION: Pt has soreness in area of R pec due to increased tightness in this area. Added pec stretches today and encouraged pt to begin to do these  at home. After several stretches in clinic today pt was already beginning to have a little less soreness with the stretch.   Pt will benefit from skilled therapeutic intervention to improve on the following deficits: decreased knowledge of condition, decreased knowledge of use of DME, decreased ROM, decreased strength, increased edema, increased fascial restrictions, impaired UE functional use, postural dysfunction, and pain  PT treatment/interventions: ADL/Self care home management, [phone]- PT Re-evaluation, 97110-Therapeutic exercises, 97530- Therapeutic activity, 97535- Self Care,  [id number]- Manual therapy, 97760- Orthotic Fit/training, Joint mobilization, Manual lymph drainage, Scar mobilization, and Compression bandaging    GOALS: Goals reviewed with patient? Yes  LONG TERM GOALS:  (STG=LTG) GOALS Name Target Date  Goal status  1 Pt will demonstrate a return to baseline cervical ROM measurements and not demonstrate any signs or symptoms of lymphedema. Baseline: 02/28/23 INITIAL  2 Pt will be indepndent in self MLD for long term managemetn of lymphedema. 02/28/23 NEW  3 Pt will obtain a compression garment for long term management of lymphedema. 02/28/23 NEW  4 Pt will demonstrate 150 degrees of R shoulder flexion to allow him to reach overhead. 02/28/23 NEW  5 Pt will demonstrate 150 degrees of R shoulder abduction to allow him to reach out to the side. 02/28/23 NEW  6 Pt will be independent in a home exercise program for long term stretching and strengthening.  02/28/23 NEW        PLAN:  PT FREQUENCY/DURATION: 2x/wk for 6 wks  PLAN FOR NEXT SESSION:cont to instruct in neck MLD using [place] approach, STM to R neck and shoulder,  continue R UE strengthening to help improve ROM and focus on posture   [facility]  [address], [address]  [place] [address]  [phone]   Scar massage You can begin gentle scar massage to you incision sites. Gently place one hand on the incision and move the skin (without sliding on the skin) in various directions. Do this for a few minutes and then you can gently massage either coconut oil or vitamin E cream into the scars.  Home exercise Program Continue doing the exercises you were given until you feel like you can do them without feeling any tightness at the end. It is best to do them for several months after completion of radiation since the effects of radiation continue past completion.   Walking Program Studies show that 30 minutes of walking per day (fast enough to elevate your heart rate) can  significantly reduce the risk of a cancer recurrence. If you can't walk due to other medical reasons, we encourage you to find another activity you could do (like a stationary bike or water exercise).  Posture After treatment for head and neck cancer, people frequently sit with rounded shoulders and forward head posture because the front of the neck has become tight and it feels better. If you sit like this, you can become very tight and have pain in sitting or standing with good posture. Try to be aware of your posture and sit and stand up tall to heal properly.  Follow up PT: Please let you doctor know as soon as possible if you develop any swelling in your face or neck in the future. Lymphedema (swelling) can occur months after completion of radiation. The sooner you can let the doctor know, the sooner they can refer you back to PT. It is much easier to treat the swelling early on.   [facility], PT 02/14/2023, 12:02 PM

## 2023-02-16 ENCOUNTER — Ambulatory Visit: Payer: Medicare HMO | Admitting: Physical Therapy

## 2023-02-16 ENCOUNTER — Encounter: Payer: Self-pay | Admitting: Physical Therapy

## 2023-02-16 DIAGNOSIS — M25611 Stiffness of right shoulder, not elsewhere classified: Secondary | ICD-10-CM

## 2023-02-16 DIAGNOSIS — I890 Lymphedema, not elsewhere classified: Secondary | ICD-10-CM | POA: Diagnosis not present

## 2023-02-16 DIAGNOSIS — R293 Abnormal posture: Secondary | ICD-10-CM

## 2023-02-16 DIAGNOSIS — M25511 Pain in right shoulder: Secondary | ICD-10-CM

## 2023-02-16 DIAGNOSIS — C12 Malignant neoplasm of pyriform sinus: Secondary | ICD-10-CM

## 2023-02-16 NOTE — Therapy (Signed)
 OUTPATIENT PHYSICAL THERAPY HEAD AND NECK POST RADIATION TREATMENT   Patient Name: Randall [name] MRN: [id number] DOB:May 12, 1955, 68 y.o., male Today's Date: 02/16/2023  END OF SESSION:  PT End of Session - 02/16/23 1503     Visit Number 9    Number of Visits 13    Date for PT Re-Evaluation 02/28/23    PT Start Time 1502    PT Stop Time 1558    PT Time Calculation (min) 56 min    Activity Tolerance Patient tolerated treatment well    Behavior During Therapy [facility] for tasks assessed/performed                Past Medical History:  Diagnosis Date   Complication of anesthesia    Diabetes mellitus without complication (HCC)    Elevated coronary artery calcium  score 02/02/2021   GERD (gastroesophageal reflux disease)    History of kidney stones 2014   Hypertension    Kidney stone 06/17/2015   PONV (postoperative nausea and vomiting)    Rupture of right quadriceps tendon 09/21/2020   - s/p srugical repair   Tuberculosis 1980   Type 2 diabetes mellitus without complication, without long-term current use of insulin  (HCC) 03/21/2020   Past Surgical History:  Procedure Laterality Date   KIDNEY SURGERY     KNEE SURGERY     LITHOTRIPSY     MICROLARYNGOSCOPY Bilateral 06/17/2022   Procedure: DIRECT LARYNGOSCOPY WITH BIOPSY;  Surgeon: Llewellyn Gerard LABOR, DO;  Location: [facility] OR;  Service: ENT;  Laterality: Bilateral;   QUADRICEPS TENDON REPAIR Right 09/25/2020   Procedure: REPAIR RIGHT QUADRICEP TENDON;  Surgeon: Harden Jerona GAILS, MD;  Location: [facility] OR;  Service: Orthopedics;  Laterality: Right;   TONSILLECTOMY     Patient Active Problem List   Diagnosis Date Noted   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   Hyperlipidemia LDL goal <70 02/21/2022   Elevated coronary artery calcium  score 01/26/2021   Thoracic aortic atherosclerosis (HCC) 01/26/2021   Decreased left ventricular systolic function 12/25/2020   Resistant hypertension 11/13/2020   Right  bundle branch block 11/13/2020   Hypertensive heart disease with chronic diastolic congestive heart failure (HCC) 11/13/2020   Quadriceps tendon rupture, right, sequela     PCP: Prentice Blush, MD  REFERRING PROVIDER: Lauraine Golden, MD  REFERRING DIAG: C12 (ICD-10-CM) - Pyriform sinus cancer [facility])   THERAPY DIAG:  Lymphedema, not elsewhere classified  Stiffness of right shoulder, not elsewhere classified  Acute pain of right shoulder  Abnormal posture  Pyriform sinus cancer (HCC)  Rationale for Evaluation and Treatment: Rehabilitation  ONSET DATE: 06/17/22  SUBJECTIVE:  SUBJECTIVE STATEMENT: I started having pain in my shoulder. I am been using it and doing things.   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 He presented with bilateral neck swelling in Jan 2024. He was also diagnosed with COVID around that time and he attributed his symptoms to that. The right neck swelling continued and he presented to his PCP in April 2024 for further evaluation. 05/05/22 A soft tissue neck US  demonstrated a nonspecific 3.2 x 1.5 x 2.5 cm hypoechoic lesion in the right neck, correlating with the palpable site of concern. 05/25/22 CT neck demonstrated a soft tissue mass centered within the right piriform sinus, measuring up to 22 x 8 mm, concerning for malignancy. CT also demonstrated a right level 3 lymph node measuring up to 2.8 x 1.8 cm, with central necrosis (correlating with the hypoechoic mass seen on neck ultrasound) suspicious for metastatic disease, and indeterminate asymmetric prominence of right level 2A lymph node, measuring up to 16 x 10 mm. 06/14/22 He saw Dr. Llewellyn. Laryngoscopy performed during this visit revealed a mass lesion of the right  piriform sinus which appeared to extend in to the arytenoid and partially onto the area epiglottic fold. 06/17/22 He underwent direct laryngoscopy to obtain biopsies. Biopsy of the right pyriform sinus showed findings consistent with invasive poorly differentiated squamous cell carcinoma (nonkeratinizing). A post cricoid biopsy was also collected and showed no evidence of malignancy. 06/28/22 He met with Dr. Lauralee to discuss treatment options- chemo/radiation vs surgery with possible post op radiation. 07/07/22 PET demonstrated the right piriform sinus primary measuring 1.4 cm with an SUV max of 5.9, and the known level 3 ipsilateral nodal metastasis measuring 2.7 cm with an SUV max of 3.2. PET otherwise showed no evidence of extracervical hypermetabolic metastatic disease, and a nonspecific 2 mm LLL pulmonary nodule. He met with Dr. Loretha on 6/28 to discuss the option of chemotherapy but ultimately decided to proceed with surgery. 08/26/22 endoscopic CO2 laser excision of the hypopharyngeal mass and bilateral neck dissection by Dr. Lauralee. Completed radiation 11/8.   PATIENT GOALS:  Reassess how my recovery is going related to neck ROM, cervical pain, fatigue, and swelling.  PAIN:  Are you having pain? Yes NPRS scale: 9/10 Pain location: R neck and shoulder Pain orientation: Right  PAIN TYPE: tight, just hurts Pain description: intermittent  Aggravating factors: trying to use it Relieving factors: pain medication  PRECAUTIONS: Recent radiation, Head and neck lymphedema risk,    OBJECTIVE:   POSTURE:  Forward head and rounded shoulders posture   30 SEC SIT TO STAND: 01/17/23: injured R knee when he fell so not tested today At eval on 10/13/22- 14 reps in 30 sec without use of UEs which is  Average for patient's age   SHOULDER AROM:   At eval: Impaired  L shoulder ROM is WFL, R shoulder ROM is less than 50% of WNL  UPPER EXTREMITY AROM/PROM:  A/PROM RIGHT   eval  01/30/23 02/14/23  Shoulder  extension 49    Shoulder flexion 94 115 111  Shoulder abduction 56 70 72  Shoulder internal rotation     Shoulder external rotation       (Blank rows = not tested)  A/PROM LEFT   eval  Shoulder extension 69  Shoulder flexion 157  Shoulder abduction 170  Shoulder internal rotation   Shoulder external rotation     (Blank rows = not tested)     CERVICAL  AROM:     Percent limited 10/13/22 at eval 01/17/23  Flexion [facility] WFL  Extension 25% limited 25% limited  Right lateral flexion 50% limited 25% limited  Left lateral flexion [facility] WFL  Right rotation 25% limited WFL  Left rotation 25% limited WFL                          (Blank rows=not tested)  LYMPHEDEMA ASSESSMENT:    Circumference in cm 01/25/23  4 cm superior to sternal notch around neck 40.6 40.5  6 cm superior to sternal notch around neck 42.3 41.5  8 cm superior to sternal notch around neck 43.3 42.5  R lateral nostril from base of nose to medial tragus    L lateral nostril from base of nose to medial tragus    R corner of mouth to where ear lobe meets face    L corner of mouth to where ear lobe meets face            (Blank rows=not tested)  CURRENT/PAST TREATMENTS:  Surgery type/date: 08/26/22 bilateral neck dissection   Chemotherapy: did not do  Radiation: completed 11/18/22  OTHER SYMPTOMS: Pain Yes Fibrosis No Pitting edema No Infections No Decreased scar mobility Yes  TREATMENT PERFORMED: 02/16/23 Therapeutic Exercise:  Pulleys into flexion x and scaption x v/c for slow and controlled movement and to avoid compensatory strategies  Resisted isometric walkouts using dual cable cross machine with pt returning therapist demo as follows for R UE: flexion x 10 with 3 lbs, extension x 10 with 3 lbs, ER x 10 with 3 lbs v/c to keep elbow tucked, scapular retraction x 10 reps with 3 lbs with v/c for posture throughout Scapular stabilization on wall with green weighted ball tracing alphabet on wall with RUE  with v/c for posture Supine on large mat: AAROM with dowel in to shoulder flexion x 15 reps and in to R shoulder abduction x 15 reps and ER with pt returning therapist demo, then lying with arm outstretched for a pec stretch Discussed need to increase rest because pt is never out of pain with his shoulder and does not get much rest at night. He is doing jobs around his house and using his shoulder frequently.   02/14/23 Therapeutic Exercise:  Pulleys into flexion x and scaption x v/c for slow and controlled movement and to avoid compensatory strategies  Resisted isometric walkouts using dual cable cross machine with pt returning therapist demo as follows for R UE: flexion x 10 with 3 lbs, extension x 10 with 3 lbs, ER x 10 with 3 lbs v/c to keep elbow tucked, scapular retraction x 10 reps with 3 lbs with v/c for posture throughout Scapular stabilization on wall with green weighted ball tracing alphabet on wall with RUE with v/c for posture Attempted exercise supine over foam roll but pt unable to tolerate due to back pain Supine on large mat: alternating shoulder flexion with increased stretch felt across chest, lying with UEs in abduction for pec stretch, snow angels but in limited range due to tightness with holds at end range - encouraged pt to begin to do these at home due to increased pec tightness   02/09/23 Therapeutic Exercise:  Pulleys into flexion x and scaption x with initial instruction and cueing for ROM and warm up Resisted isometric walkouts using dual cable cross machine with pt returning therapist demo as follows for R UE:  flexion x 10 with 3 lbs, extension x 10 with 3 lbs, ER x 10 with 3 lbs v/c to keep elbow tucked, scapular retraction x 10 reps with 3 lbs with v/c for posture throughout Scapular stabilization on wall with ball tracing alphabet on wall with RUE with v/c for posture IASTM with wave tool In L sidelying to R rhomboids, R upper traps, R levator, and  R lateral cervical muscles with no tenderness today. therapist was able to use normal pressure without discomfort    02/07/23 Resisted isometric walkouts using dual cable cross machine with pt returning therapist demo as follows for R UE: flexion x 10 with 3 lbs, extension x 10 with 3 lbs, ER x 10 with 3 lbs v/c to keep elbow tucked, scapular retraction x 10 reps with 3 lbs with v/c for posture throughout Scapular stabilization on wall with ball tracing alphabet on wall with RUE with v/c for posture IASTM with wave tool In L sidelying to R rhomboids, R upper traps, R levator, and R lateral cervical muscles with multiple areas of muscle tightness noted and therapist unable to use much pressure but by end of session muscle tightness had improved greatly and therapist was able to use normal pressure without discomfort  Educated pt about using tennis ball on wall for self STM at home  02/02/23: Sidelying R shoulder abduction x 10 Sidelying R shoulder flexion x 10 Sidelying R shoulder ER with towel at elbow x 10 - v/c to keep elbow tucked Resisted isometric walkouts using dual cable cross machine with pt returning therapist demo as follows for R UE: flexion x 10 with 7 lbs, extension x 10 with 10 lbs, ER x 10 with 3 lbs v/c to keep elbow tucked Scapular stabilization on wall with ball x 10 reps in both directions Standing 1 arm row with 3 lb weight on L x 15 reps v/c to keep arm close to body Scapular retraction with red band x 20 reps  01/30/23 Standing single arm row x 10 with arm using other chair Standing single arm horizontal abduction x 10 Standing single arm shoulder shrug x 10 Ranger flexion x 6, abduction x 4 then some increased pain Supine pro/ret x 6 Pt reports doing most of these at home [place] anterior approach handout as follows seated in front of mirror: short neck, 5 diaphragmatic breaths, bilateral axillary nodes, bilateral pectoral nodes, anterior chest, short neck, posterior neck  moving fluid towards pathway aimed at lateral neck, then lateral and anterior neck moving fluid towards pathway aimed at lateral neck then retracing all steps. Had pt return demonstrate each step while therapist provided v/c and t/c for direction of skin stretch, pressure and sequence.   01/27/23:  Continued instructing pt using [place] anterior approach handout as follows seated in front of mirror: short neck, 5 diaphragmatic breaths, bilateral axillary nodes, bilateral pectoral nodes, anterior chest, short neck, posterior neck moving fluid towards pathway aimed at lateral neck, then lateral and anterior neck moving fluid towards pathway aimed at lateral neck then retracing all steps. Had pt return demonstrate each step while therapist provided v/c and t/c for direction of skin stretch, pressure and sequence.  Then supine additional work to the neck by PT  01/25/23:  Remeasured circumferences Instructed pt using [place] anterior approach handout as follows seated in front of mirror: short neck, 5 diaphragmatic breaths, bilateral axillary nodes, bilateral pectoral nodes, anterior chest, short neck, posterior neck moving fluid towards pathway aimed at lateral neck, then lateral  and anterior neck moving fluid towards pathway aimed at lateral neck then retracing all steps. Educated pt in anatomy and physiology of the lymphatic system and had pt return demonstrate each step while therapist provided v/c and t/c for direction of skin stretch, pressure and sequence.  Assisted pt with re donning his compression garment at the end of the session  PATIENT EDUCATION:  Education details: lymphedema eduction and need for compression garment and MLD, wearing chip pack at least 4 hours/day Person educated: Patient Education method: Explanation Education comprehension: verbalized understanding  HOME EXERCISE PROGRAM: Reviewed previously given post op HEP.- sidelying abduction AROM, flexion AROM, ER AROM,  retractions Wear chip pack for at least 4 hours/day - don't tie too tightly  ASSESSMENT:  CLINICAL IMPRESSION: Pt was having pain with AROM shoulder exercises so began AAROM shoulder exercises and these were more tolerable. Gave him some thin stockinette to put a tennis ball in for STM at wall for his back to decrease muscle tightness. Educated pt on importance of rest to allow the muscles to relax and decrease the pain cycle. Pt has had consistent 9/10 pain in L neck and shoulder. He is very active at home with home improvement projects.   Pt will benefit from skilled therapeutic intervention to improve on the following deficits: decreased knowledge of condition, decreased knowledge of use of DME, decreased ROM, decreased strength, increased edema, increased fascial restrictions, impaired UE functional use, postural dysfunction, and pain  PT treatment/interventions: ADL/Self care home management, [id number]- PT Re-evaluation, 97110-Therapeutic exercises, 97530- Therapeutic activity, 97535- Self Care, [id number]- Manual therapy, 97760- Orthotic Fit/training, Joint mobilization, Manual lymph drainage, Scar mobilization, and Compression bandaging    GOALS: Goals reviewed with patient? Yes  LONG TERM GOALS:  (STG=LTG) GOALS Name Target Date  Goal status  1 Pt will demonstrate a return to baseline cervical ROM measurements and not demonstrate any signs or symptoms of lymphedema. Baseline: 02/28/23 INITIAL  2 Pt will be indepndent in self MLD for long term managemetn of lymphedema. 02/28/23 NEW  3 Pt will obtain a compression garment for long term management of lymphedema. 02/28/23 NEW  4 Pt will demonstrate 150 degrees of R shoulder flexion to allow him to reach overhead. 02/28/23 NEW  5 Pt will demonstrate 150 degrees of R shoulder abduction to allow him to reach out to the side. 02/28/23 NEW  6 Pt will be independent in a home exercise program for long term stretching and strengthening.  02/28/23 NEW         PLAN:  PT FREQUENCY/DURATION: 2x/wk for 6 wks  PLAN FOR NEXT SESSION:cont to instruct in neck MLD using Norton approach, STM to R neck and shoulder,  continue R UE strengthening to help improve ROM and focus on posture   [facility]  [address], [address]  [place] [address]  [phone]   Scar massage You can begin gentle scar massage to you incision sites. Gently place one hand on the incision and move the skin (without sliding on the skin) in various directions. Do this for a few minutes and then you can gently massage either coconut oil or vitamin E cream into the scars.  Home exercise Program Continue doing the exercises you were given until you feel like you can do them without feeling any tightness at the end. It is best to do them for several months after completion of radiation since the effects of radiation continue past completion.   Walking Program Studies show that 30  minutes of walking per day (fast enough to elevate your heart rate) can significantly reduce the risk of a cancer recurrence. If you can't walk due to other medical reasons, we encourage you to find another activity you could do (like a stationary bike or water exercise).  Posture After treatment for head and neck cancer, people frequently sit with rounded shoulders and forward head posture because the front of the neck has become tight and it feels better. If you sit like this, you can become very tight and have pain in sitting or standing with good posture. Try to be aware of your posture and sit and stand up tall to heal properly.  Follow up PT: Please let you doctor know as soon as possible if you develop any swelling in your face or neck in the future. Lymphedema (swelling) can occur months after completion of radiation. The sooner you can let the doctor know, the sooner they can refer you back to PT. It is much easier to treat the swelling early on.   [facility], PT 02/16/2023, 4:00 PM

## 2023-02-20 ENCOUNTER — Ambulatory Visit: Payer: Medicare HMO | Attending: Nurse Practitioner | Admitting: Emergency Medicine

## 2023-02-20 ENCOUNTER — Encounter: Payer: Self-pay | Admitting: Emergency Medicine

## 2023-02-20 VITALS — BP 138/64 | HR 82 | Ht 70.0 in | Wt 197.0 lb

## 2023-02-20 DIAGNOSIS — E785 Hyperlipidemia, unspecified: Secondary | ICD-10-CM | POA: Diagnosis not present

## 2023-02-20 DIAGNOSIS — E119 Type 2 diabetes mellitus without complications: Secondary | ICD-10-CM

## 2023-02-20 DIAGNOSIS — I5032 Chronic diastolic (congestive) heart failure: Secondary | ICD-10-CM | POA: Diagnosis not present

## 2023-02-20 DIAGNOSIS — I1A0 Resistant hypertension: Secondary | ICD-10-CM

## 2023-02-20 DIAGNOSIS — M7989 Other specified soft tissue disorders: Secondary | ICD-10-CM

## 2023-02-20 DIAGNOSIS — I2510 Atherosclerotic heart disease of native coronary artery without angina pectoris: Secondary | ICD-10-CM

## 2023-02-20 NOTE — Progress Notes (Signed)
 Cardiology Office Note:    Date:  02/20/2023  ID:  Randall [name], DOB [birth year], MRN [id number] PCP: Tura Gaines, MD   [facility] Providers Cardiologist:  [name], MD       Patient Profile:      Randall [name] is a 68 year old male with visit pertinent history of resistant hypertension, chronic diastolic heart failure, HLD, elevated coronary artery calcium  score, thoracic aortic atherosclerosis, right bundle branch block, T2DM, laryngeal cancer.   He established care with cardiology service in November 2022 for abnormal EKG and hypertension management.  He was on 3 antihypertensive agents with persistently elevated blood pressures and his EKG showed right bundle branch block.  Echocardiogram was ordered and completed 12/08/2020 showing LVEF 45-50%, global hypokinesis, grade 1 DD, mild mitral valve regurgitation, mild calcification of the aortic valve.  Decreased LV function was thought to be in the setting of uncontrolled HTN.  Bundle branch block thought to be related to wall motion abnormality.  CT cardiac scoring completed on 02/02/2021 showing coronary calcium  score 683, 87 percentile for age, race, sex and over read interpretation CT chest showing aortic atherosclerosis.  Seen in clinic on 02/18/2022 and was without any active cardiovascular symptoms.  His blood pressure was still borderline high and his chlorthalidone  was titrated up to 25 mg.  He was on combination of amlodipine 10 mg, carvedilol  25 mg twice daily, telmisartan 80 mg daily, chlorthalidone  25 mg daily.  Last seen in clinic on 01/12/2023.  Noted to experience significant increase in his chronic lower extremity edema.  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  His Lasix  was increased to 40 mg daily.  Echocardiogram ordered showing LVEF 55-60%, grade 2 DD, RV SF normal, RV moderately enlarged, left atrial size severely dilated, no valvular abnormalities        History  of Present Illness:  Discussed the use of AI scribe software for clinical note transcription with the patient, who gave verbal consent to proceed.  Randall [name] is a 68 y.o. male who returns for follow-up for HFpEF  He comes into office today by himself.  He reports that his leg swelling has improved considerably, attributing this to lifestyle changes such as staying off his feet more, elevating his legs, and being more active. He also wears compression stockings when he is active. Despite these improvements, he continues to experience aches and pains, which he attributes to various factors, including his wife's health issues.  He does take his blood pressure at home daily and notes his average blood pressures are in the upper 130s to low 140s.  He is without any exertional angina, dyspnea, orthopnea, PND, syncope, lightheadedness, dizziness.    Review of Systems  Constitutional: Negative for weight gain and weight loss.  Cardiovascular:  Negative for chest pain, claudication, dyspnea on exertion, irregular heartbeat, leg swelling, near-syncope, orthopnea, palpitations, paroxysmal nocturnal dyspnea and syncope.  Respiratory:  Negative for cough, hemoptysis and shortness of breath.   Gastrointestinal:  Negative for abdominal pain, hematochezia and melena.  Genitourinary:  Negative for hematuria.  Neurological:  Negative for dizziness and light-headedness.     See HPI     Home Medications:    Prior to Admission medications   Medication Sig Start Date End Date Taking? Authorizing Provider  amLODipine (NORVASC) 10 MG tablet Take 10 mg by mouth daily.    [provider]  aspirin  EC 81 MG tablet Take 81 mg by mouth daily. Swallow whole.  [provider]  atorvastatin  (LIPITOR) 20 MG tablet Take 20 mg by mouth daily.    [provider]  carvedilol  (COREG ) 25 MG tablet Take 25 mg by mouth 2 (two) times daily with a meal.    [provider]  cetirizine  (ZYRTEC) 10 MG tablet Take 10 mg by mouth daily as needed for allergies. 24 hour    [provider]  chlorthalidone  (HYGROTON ) 25 MG tablet TAKE 1/2 TABLET BY MOUTH DAILY 08/15/22   Arleen Lacer, MD  fentaNYL  (DURAGESIC ) 25 MCG/HR Place 1 patch onto the skin every 3 (three) days. 12/22/22   Pearlene Bouchard, PA-C  furosemide  (LASIX ) 40 MG tablet Take 1 tablet (40 mg total) by mouth daily. 01/12/23 04/12/23  Jude Norton, NP  glimepiride (AMARYL) 4 MG tablet Take 2 mg by mouth daily with breakfast.    [provider]  glucose blood (FREESTYLE LITE) test strip Test glucose BID   Diagnoses Code: E11.9 10/19/20   [provider]  HYDROcodone -acetaminophen  (HYCET) 7.5-325 mg/15 ml solution Take 10-15 mL every three hours as needed for pain 02/10/23   Colie Dawes, MD  LORazepam  (ATIVAN ) 0.5 MG tablet Take 1 tablet (0.5 mg total) by mouth every 8 (eight) hours. Take as needed for aversion to feedings. 01/18/23   Pearlene Bouchard, PA-C  meloxicam  (MOBIC ) 15 MG tablet Take 15 mg by mouth daily. 09/07/20   [provider]  metFORMIN (GLUCOPHAGE-XR) 500 MG 24 hr tablet Take 1,000 mg by mouth 2 (two) times daily. 09/01/20   [provider]  ondansetron  (ZOFRAN ) 8 MG tablet Take 1 tablet (8 mg total) by mouth every 8 (eight) hours as needed for nausea or vomiting. 12/22/22   Pearlene Bouchard, PA-C  OVER THE COUNTER MEDICATION Take 2 capsules by mouth daily. xEO Mega Complex    [provider]  OVER THE COUNTER MEDICATION Take 2 capsules by mouth daily. Alpha crs plus supplement    [provider]  OVER THE COUNTER MEDICATION Take 2 capsules by mouth daily. Microplex vmz supplement    [provider]  pantoprazole (PROTONIX) 40 MG tablet Take 40 mg by mouth daily.    [provider]  potassium citrate  (UROCIT-K ) 10 MEQ (1080 MG) SR tablet Take 10 mEq by mouth 2 (two) times daily. 09/21/20   [provider]  sodium fluoride (FLUORISHIELD)  1.1 % GEL dental gel Place 1 Application onto teeth at bedtime. 07/07/22   [provider]  sucralfate  (CARAFATE ) 1 g tablet Dissolve 1 tablet in 10 mL H20 and swallow 30 min prior to meals and bedtime PRN sore throat. 02/10/23   Colie Dawes, MD  tadalafil (CIALIS) 20 MG tablet Take 20 mg by mouth daily as needed for erectile dysfunction. 07/27/20   [provider]  telmisartan (MICARDIS) 80 MG tablet Take 80 mg by mouth daily.    [provider]  triamcinolone cream (KENALOG) 0.1 % Apply 1 application topically 3 (three) times daily as needed for itching (rash). 09/10/20   [provider]  TRULICITY 1.5 MG/0.5ML SOPN Inject 1.5 mg as directed once a week. Thursday morning 10/20/20   [provider]   Studies Reviewed:       Echocardiogram 02/09/2023 1. Left ventricular ejection fraction, by estimation, is 55 to 60%. Left  ventricular ejection fraction by 3D volume is 57 %. The left ventricle has  normal function. Left ventricular endocardial border not optimally defined  to evaluate regional wall  motion. Left ventricular diastolic parameters are consistent with Grade II  diastolic dysfunction (pseudonormalization). The average left ventricular  global longitudinal strain is -17.1 %. The global longitudinal strain is  abnormal.   2. Right ventricular systolic function is normal. The right ventricular  size is moderately enlarged. Tricuspid regurgitation signal is inadequate  for assessing PA pressure.   3. Left atrial size was severely dilated.   4. The mitral valve is normal in structure. No evidence of mitral valve  regurgitation. No evidence of mitral stenosis.   5. The aortic valve is tricuspid. There is mild calcification of the  aortic valve. There is mild thickening of the aortic valve. Aortic valve  regurgitation is not visualized. No aortic stenosis is present.   6. The inferior vena cava is dilated in size with >50% respiratory   variability, suggesting right atrial pressure of 8 mmHg.   CT Cardiac Scoring 02/02/2021 IMPRESSION: Coronary calcium  score of 683. This was 7 percentile for age-, race-, and sex-matched controls.   Aortic atherosclerosis.   Echocardiogram 12/08/2020 1. Left ventricular ejection fraction, by estimation, is 45 to 50%. The  left ventricle has mildly decreased function. The left ventricle  demonstrates global hypokinesis. Left ventricular diastolic parameters are  consistent with Grade I diastolic  dysfunction (impaired relaxation).   2. Right ventricular systolic function is normal. The right ventricular  size is normal.   3. The mitral valve is normal in structure. Mild mitral valve  regurgitation. No evidence of mitral stenosis.   4. The aortic valve is normal in structure. There is mild calcification  of the aortic valve. There is mild thickening of the aortic valve. Aortic  valve regurgitation is not visualized. Aortic valve  sclerosis/calcification is present, without any  evidence of aortic stenosis.   5. The inferior vena cava is normal in size with greater than 50%  respiratory variability, suggesting right atrial pressure of 3 mmHg.  Risk Assessment/Calculations:             Physical Exam:   VS:  BP 138/64 (BP Location: Left Arm, Patient Position: Sitting, Cuff Size: Large)   Pulse 82   Ht 5\' 10"  (1.778 m)   Wt 197 lb (89.4 kg)   SpO2 98%   BMI 28.27 kg/m    Wt Readings from Last 3 Encounters:  02/20/23 197 lb (89.4 kg)  02/10/23 197 lb (89.4 kg)  01/12/23 192 lb 6.4 oz (87.3 kg)    Constitutional:      Appearance: Normal and healthy appearance. Not in distress.  Neck:     Vascular: JVD normal.  Pulmonary:     Effort: Pulmonary effort is normal.     Breath sounds: Normal breath sounds.  Chest:     Chest wall: Not tender to palpatation.  Cardiovascular:     PMI at left midclavicular line. Normal rate. Regular rhythm. Normal S1. Normal S2.      Murmurs:  There is no murmur.     No gallop.  No click. No rub.  Pulses:    Intact distal pulses.  Edema:    Peripheral edema absent.  Musculoskeletal: Normal range of motion.     Cervical back: Normal range of motion and neck supple. Skin:    General: Skin is warm and dry.  Neurological:     General: No focal deficit present.     Mental Status: Alert, oriented to person, place, and time and oriented to person, place and time.  Psychiatric:  Mood and Affect: Mood and affect normal.        Behavior: Behavior is cooperative.        Thought Content: Thought content normal.        Assessment and Plan:  HFpEF / Leg swelling  -Echocardiogram 11/2020 showing LVEF 45-50% grade 1 DD and subsequent echocardiogram 02/09/2023 showed LVEF 55-60%, grade 2 DD showing improved EF however noted progression of diastolic function from grade 1 to grade 2. Euvolemic and well-compensated on exam today. NYHA class 1 Weight has been stable at home.  Without lower extremity swelling, SOB/DOE, orthopnea, PND He notes since Lasix  was increased he has noted improvement in his leg swelling.  He has made lifestyle changes since we last saw him, including elevating legs, wearing leg stockings, limiting salt intake, and exercising  -Continue Lasix  40mg  daily, Carvedilol  25 mg daily -Ordered BMET today   Resistant hypertension BP today 140/62 and repeat 138/64.  Home BPs average upper 130s to 140s.  He is above his goal of less than 130/80.  He does have grade 2 DD noted on prior echocardiogram. -Amlodipine discontinued 12/2022 due to hypotension  -Plan to increase Chlorthalidone  to 25 mg daily.  Continue Carvedilol  25 mg twice daily and Telmisartan 80 mg daily -Continue to monitor BP at home  -BMET ordered to be completed in 1 week   Coronary artery disease / Hyperlipidemia / Aortic atherosclerosis CT cardiac scoring 01/2021 with coronary calcium  score 683, (87%). He is stable with no anginal symptoms, no indication  for ischemic evaluation at this time.  His last LDL was 138 on 09/2021 and currently managed by PCP. Not under good control with a target goal less than 70. -Fasting lipid panel ordered today.  Can increase Atorvastatin  if needed to get to goal -Continue Aspirin  81 mg daily, Atorvastatin  20mg  daily, Carvedilol  25 mg twice daily, Telmisartan 80 mg daily  T2DM A1c 5.6 on 12/2022 -Continue current medical therapy -Managed by PCP  General Health Maintenance -Continue lifestyle modifications including salt restriction, increased physical activity, and leg elevation. -Continue wearing compression stockings when active.            Dispo:  F/u with scheduled visit with Dr. Addie Holstein on 03/27/2023   Signed, Ava Boatman, NP

## 2023-02-20 NOTE — Patient Instructions (Addendum)
 Medication Instructions:  Increase Chlorthalidone  25 mg daily   *If you need a refill on your cardiac medications before your next appointment, please call your pharmacy*   Lab Work: Fasting lipid panel & BMET in 1 week.   Testing/Procedures: NONE ordered at this time of appointment     Follow-Up: At [facility], you and your health needs are our priority.  As part of our continuing mission to provide you with exceptional heart care, we have created designated Provider Care Teams.  These Care Teams include your primary Cardiologist (physician) and Advanced Practice Providers (APPs -  Physician Assistants and Nurse Practitioners) who all work together to provide you with the care you need, when you need it.  We recommend signing up for the patient portal called "MyChart".  Sign up information is provided on this After Visit Summary.  MyChart is used to connect with patients for Virtual Visits (Telemedicine).  Patients are able to view lab/test results, encounter notes, upcoming appointments, etc.  Non-urgent messages can be sent to your provider as well.   To learn more about what you can do with MyChart, go to [email or web address].    Your next appointment:    Keep follow up   Provider:   [name], MD     Other Instructions Monitor blood pressure.

## 2023-02-21 ENCOUNTER — Encounter: Payer: Medicare HMO | Admitting: Physical Therapy

## 2023-02-21 ENCOUNTER — Ambulatory Visit: Payer: Self-pay | Admitting: Radiation Oncology

## 2023-02-23 ENCOUNTER — Encounter: Payer: Medicare HMO | Admitting: Physical Therapy

## 2023-02-26 ENCOUNTER — Encounter: Payer: Self-pay | Admitting: Radiation Oncology

## 2023-02-27 ENCOUNTER — Other Ambulatory Visit: Payer: Self-pay | Admitting: Radiation Oncology

## 2023-02-27 ENCOUNTER — Encounter: Payer: Self-pay | Admitting: Radiation Oncology

## 2023-02-27 DIAGNOSIS — C12 Malignant neoplasm of pyriform sinus: Secondary | ICD-10-CM

## 2023-02-27 LAB — BASIC METABOLIC PANEL
BUN/Creatinine Ratio: 37 — ABNORMAL HIGH (ref 10–24)
BUN: 43 mg/dL — ABNORMAL HIGH (ref 8–27)
CO2: 25 mmol/L (ref 20–29)
Calcium: 9.5 mg/dL (ref 8.6–10.2)
Chloride: 89 mmol/L — ABNORMAL LOW (ref 96–106)
Creatinine, Ser: 1.16 mg/dL (ref 0.76–1.27)
Glucose: 114 mg/dL — ABNORMAL HIGH (ref 70–99)
Potassium: 3.6 mmol/L (ref 3.5–5.2)
Sodium: 129 mmol/L — ABNORMAL LOW (ref 134–144)
eGFR: 69 mL/min/{1.73_m2} (ref >59)

## 2023-02-27 LAB — LIPID PANEL
Chol/HDL Ratio: 2.7 {ratio} (ref 0.0–5.0)
Cholesterol, Total: 126 mg/dL (ref 100–199)
HDL: 47 mg/dL (ref >39)
LDL Chol Calc (NIH): 57 mg/dL (ref 0–99)
Triglycerides: 122 mg/dL (ref 0–149)
VLDL Cholesterol Cal: 22 mg/dL (ref 5–40)

## 2023-02-27 MED ORDER — ONDANSETRON HCL 8 MG PO TABS: 8.0000 mg | ORAL_TABLET | Freq: Three times a day (TID) | ORAL | 2 refills | Status: DC | PRN

## 2023-02-27 MED ORDER — HYDROCODONE-ACETAMINOPHEN 7.5-325 MG/15ML PO SOLN: ORAL | 0 refills | Status: AC

## 2023-02-28 ENCOUNTER — Ambulatory Visit: Payer: Medicare HMO | Admitting: Physical Therapy

## 2023-02-28 ENCOUNTER — Encounter: Payer: Self-pay | Admitting: Physical Therapy

## 2023-02-28 DIAGNOSIS — C12 Malignant neoplasm of pyriform sinus: Secondary | ICD-10-CM

## 2023-02-28 DIAGNOSIS — M25611 Stiffness of right shoulder, not elsewhere classified: Secondary | ICD-10-CM

## 2023-02-28 DIAGNOSIS — R293 Abnormal posture: Secondary | ICD-10-CM

## 2023-02-28 DIAGNOSIS — I890 Lymphedema, not elsewhere classified: Secondary | ICD-10-CM

## 2023-02-28 DIAGNOSIS — M25511 Pain in right shoulder: Secondary | ICD-10-CM

## 2023-02-28 NOTE — Therapy (Signed)
 OUTPATIENT PHYSICAL THERAPY HEAD AND NECK POST RADIATION TREATMENT   Patient Name: Randall [name] MRN: [id number] DOB:09-Nov-1955, 68 y.o., male Today's Date: 02/28/2023  END OF SESSION:  PT End of Session - 02/28/23 1152     Visit Number 10    Number of Visits 18    Date for PT Re-Evaluation 03/28/23    PT Start Time 1105    PT Stop Time 1153    PT Time Calculation (min) 48 min    Activity Tolerance Patient tolerated treatment well    Behavior During Therapy [facility] for tasks assessed/performed                 Past Medical History:  Diagnosis Date   Complication of anesthesia    Diabetes mellitus without complication (HCC)    Elevated coronary artery calcium score 02/02/2021   GERD (gastroesophageal reflux disease)    History of kidney stones 2014   Hypertension    Kidney stone 06/17/2015   PONV (postoperative nausea and vomiting)    Rupture of right quadriceps tendon 09/21/2020   - s/p srugical repair   Tuberculosis 1980   Type 2 diabetes mellitus without complication, without long-term current use of insulin (HCC) 03/21/2020   Past Surgical History:  Procedure Laterality Date   KIDNEY SURGERY     KNEE SURGERY     LITHOTRIPSY     MICROLARYNGOSCOPY Bilateral 06/17/2022   Procedure: DIRECT LARYNGOSCOPY WITH BIOPSY;  Surgeon: Laren Boom, DO;  Location: [facility] OR;  Service: ENT;  Laterality: Bilateral;   QUADRICEPS TENDON REPAIR Right 09/25/2020   Procedure: REPAIR RIGHT QUADRICEP TENDON;  Surgeon: Nadara Mustard, MD;  Location: [facility] OR;  Service: Orthopedics;  Laterality: Right;   TONSILLECTOMY     Patient Active Problem List   Diagnosis Date Noted   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   Hyperlipidemia LDL goal <70 02/21/2022   Elevated coronary artery calcium score 01/26/2021   Thoracic aortic atherosclerosis (HCC) 01/26/2021   Decreased left ventricular systolic function 12/25/2020   Resistant hypertension 11/13/2020    Right bundle branch block 11/13/2020   Hypertensive heart disease with chronic diastolic congestive heart failure (HCC) 11/13/2020   Quadriceps tendon rupture, right, sequela     PCP: Benedetto Goad, MD  REFERRING PROVIDER: Lonie Peak, MD  REFERRING DIAG: C12 (ICD-10-CM) - Pyriform sinus cancer [facility])   THERAPY DIAG:  Lymphedema, not elsewhere classified  Stiffness of right shoulder, not elsewhere classified  Acute pain of right shoulder  Abnormal posture  Pyriform sinus cancer (HCC)  Rationale for Evaluation and Treatment: Rehabilitation  ONSET DATE: 06/17/22  SUBJECTIVE:  SUBJECTIVE STATEMENT: I fell on Sunday night. When I came to I didn't even what happened. I woke up wet from sweat. I don't remember where I was at or how I got in the room I was in. I fell twice last week when I was sick and I was wearing socks in the kitchen. My blood sugar as fine during that time. I have talked to all of my doctors and was seen by the cardiologist and had blood work done.   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 He presented with bilateral neck swelling in Jan 2024. He was also diagnosed with COVID around that time and he attributed his symptoms to that. The right neck swelling continued and he presented to his PCP in April 2024 for further evaluation. 05/05/22 A soft tissue neck US demonstrated a nonspecific 3.2 x 1.5 x 2.5 cm hypoechoic lesion in the right neck, correlating with the palpable site of concern. 05/25/22 CT neck demonstrated a soft tissue mass centered within the right piriform sinus, measuring up to 22 x 8 mm, concerning for malignancy. CT also demonstrated a right level 3 lymph node measuring up to 2.8 x 1.8 cm, with central necrosis  (correlating with the hypoechoic mass seen on neck ultrasound) suspicious for metastatic disease, and indeterminate asymmetric prominence of right level 2A lymph node, measuring up to 16 x 10 mm. 06/14/22 He saw Dr. Marene Lenz. Laryngoscopy performed during this visit revealed a mass lesion of the right piriform sinus which appeared to extend in to the arytenoid and partially onto the area epiglottic fold. 06/17/22 He underwent direct laryngoscopy to obtain biopsies. Biopsy of the right pyriform sinus showed findings consistent with invasive poorly differentiated squamous cell carcinoma (nonkeratinizing). A post cricoid biopsy was also collected and showed no evidence of malignancy. 06/28/22 He met with Dr. Hezzie Bump to discuss treatment options- chemo/radiation vs surgery with possible post op radiation. 07/07/22 PET demonstrated the right piriform sinus primary measuring 1.4 cm with an SUV max of 5.9, and the known level 3 ipsilateral nodal metastasis measuring 2.7 cm with an SUV max of 3.2. PET otherwise showed no evidence of extracervical hypermetabolic metastatic disease, and a nonspecific 2 mm LLL pulmonary nodule. He met with Dr. Al Pimple on 6/28 to discuss the option of chemotherapy but ultimately decided to proceed with surgery. 08/26/22 endoscopic CO2 laser excision of the hypopharyngeal mass and bilateral neck dissection by Dr. Hezzie Bump. Completed radiation 11/8.   PATIENT GOALS:  Reassess how my recovery is going related to neck ROM, cervical pain, fatigue, and swelling.  PAIN:  Are you having pain? Yes NPRS scale: 9/10 Pain location: R neck and shoulder Pain orientation: Right  PAIN TYPE: tight, just hurts Pain description: intermittent  Aggravating factors: trying to use it Relieving factors: pain medication  PRECAUTIONS: Recent radiation, Head and neck lymphedema risk,    OBJECTIVE:   POSTURE:  Forward head and rounded shoulders posture   30 SEC SIT TO STAND: 01/17/23: injured R knee when he  fell so not tested today At eval on 10/13/22- 14 reps in 30 sec without use of UEs which is  Average for patient's age   SHOULDER AROM:   At eval: Impaired  L shoulder ROM is WFL, R shoulder ROM is less than 50% of WNL  UPPER EXTREMITY AROM/PROM:  A/PROM RIGHT   eval  01/30/23 02/14/23 02/28/23  Shoulder extension 49     Shoulder flexion 94 115 111 120  Shoulder abduction 56 70 72 115  Shoulder internal rotation      Shoulder external rotation        (Blank rows = not tested)  A/PROM LEFT   eval  Shoulder extension 69  Shoulder flexion 157  Shoulder abduction 170  Shoulder internal rotation   Shoulder external rotation     (Blank rows = not tested)     CERVICAL AROM:     Percent limited 10/13/22 at eval 01/17/23 02/28/23  Flexion WFL WFL WFL  Extension 25% limited 25% limited 25% limited  Right lateral flexion 50% limited 25% limited 25% limited  Left lateral flexion [facility] WFL  Right rotation 25% limited WFL 25% limited  Left rotation 25% limited WFL WFL                          (Blank rows=not tested)  LYMPHEDEMA ASSESSMENT:    Circumference in cm 01/25/23 02/28/23  4 cm superior to sternal notch around neck 40.6 40.5 39.5  6 cm superior to sternal notch around neck 42.3 41.5 39.7  8 cm superior to sternal notch around neck 43.3 42.5 41  R lateral nostril from base of nose to medial tragus     L lateral nostril from base of nose to medial tragus     R corner of mouth to where ear lobe meets face     L corner of mouth to where ear lobe meets face               (Blank rows=not tested)  CURRENT/PAST TREATMENTS:  Surgery type/date: 08/26/22 bilateral neck dissection   Chemotherapy: did not do  Radiation: completed 11/18/22  OTHER SYMPTOMS: Pain Yes Fibrosis No Pitting edema No Infections No Decreased scar mobility Yes  TREATMENT PERFORMED: 02/28/23 Assessed progress towards goals in therapy Therapeutic Exercise:  Pulleys into flexion x and scaption x  v/c for slow and controlled movement and to avoid compensatory strategies  Dual cable cross machine with pt returning therapist demo as follows for R UE: flexion x 10 with 3 lbs, extension x 10 with 3 lbs, ER x 10 with 3 lbs v/c to keep elbow tucked, scapular retraction x 15 reps with 3 lbs with v/c for posture throughout Scapular stabilization on wall with green weighted ball tracing alphabet on wall with RUE with v/c for posture  02/16/23 Therapeutic Exercise:  Pulleys into flexion x and scaption x v/c for slow and controlled movement and to avoid compensatory strategies  Resisted isometric walkouts using dual cable cross machine with pt returning therapist demo as follows for R UE: flexion x 10 with 3 lbs, extension x 10 with 3 lbs, ER x 10 with 3 lbs v/c to keep elbow tucked, scapular retraction x 10 reps with 3 lbs with v/c for posture throughout Scapular stabilization on wall with green weighted ball tracing alphabet on wall with RUE with v/c for posture Supine on large mat: AAROM with dowel in to shoulder flexion x 15 reps and in to R shoulder abduction x 15 reps and ER with pt returning therapist demo, then lying with arm outstretched for a pec stretch Discussed need to increase rest because pt is never out of pain with his shoulder and does not get much rest at night. He is doing jobs around his house and using his shoulder frequently.    02/14/23 Therapeutic Exercise:  Pulleys into flexion x and scaption x  v/c for slow and controlled movement and to avoid compensatory strategies  Resisted isometric walkouts using dual cable cross machine with pt returning therapist demo as follows for R UE: flexion x 10 with 3 lbs, extension x 10 with 3 lbs, ER x 10 with 3 lbs v/c to keep elbow tucked, scapular retraction x 10 reps with 3 lbs with v/c for posture throughout Scapular stabilization on wall with green weighted ball tracing alphabet on wall with RUE with v/c for  posture Attempted exercise supine over foam roll but pt unable to tolerate due to back pain Supine on large mat: alternating shoulder flexion with increased stretch felt across chest, lying with UEs in abduction for pec stretch, snow angels but in limited range due to tightness with holds at end range - encouraged pt to begin to do these at home due to increased pec tightness 02/14/23 Therapeutic Exercise:  Pulleys into flexion x and scaption x v/c for slow and controlled movement and to avoid compensatory strategies  Resisted isometric walkouts using dual cable cross machine with pt returning therapist demo as follows for R UE: flexion x 10 with 3 lbs, extension x 10 with 3 lbs, ER x 10 with 3 lbs v/c to keep elbow tucked, scapular retraction x 10 reps with 3 lbs with v/c for posture throughout Scapular stabilization on wall with green weighted ball tracing alphabet on wall with RUE with v/c for posture Attempted exercise supine over foam roll but pt unable to tolerate due to back pain Supine on large mat: alternating shoulder flexion with increased stretch felt across chest, lying with UEs in abduction for pec stretch, snow angels but in limited range due to tightness with holds at end range - encouraged pt to begin to do these at home due to increased pec tightness   02/09/23 Therapeutic Exercise:  Pulleys into flexion x and scaption x with initial instruction and cueing for ROM and warm up Resisted isometric walkouts using dual cable cross machine with pt returning therapist demo as follows for R UE: flexion x 10 with 3 lbs, extension x 10 with 3 lbs, ER x 10 with 3 lbs v/c to keep elbow tucked, scapular retraction x 10 reps with 3 lbs with v/c for posture throughout Scapular stabilization on wall with ball tracing alphabet on wall with RUE with v/c for posture IASTM with wave tool In L sidelying to R rhomboids, R upper traps, R levator, and R lateral cervical muscles with no  tenderness today. therapist was able to use normal pressure without discomfort    02/07/23 Resisted isometric walkouts using dual cable cross machine with pt returning therapist demo as follows for R UE: flexion x 10 with 3 lbs, extension x 10 with 3 lbs, ER x 10 with 3 lbs v/c to keep elbow tucked, scapular retraction x 10 reps with 3 lbs with v/c for posture throughout Scapular stabilization on wall with ball tracing alphabet on wall with RUE with v/c for posture IASTM with wave tool In L sidelying to R rhomboids, R upper traps, R levator, and R lateral cervical muscles with multiple areas of muscle tightness noted and therapist unable to use much pressure but by end of session muscle tightness had improved greatly and therapist was able to use normal pressure without discomfort  Educated pt about using tennis ball on wall for self STM at home  02/02/23: Sidelying R shoulder abduction x 10 Sidelying R shoulder flexion x 10 Sidelying R shoulder  ER with towel at elbow x 10 - v/c to keep elbow tucked Resisted isometric walkouts using dual cable cross machine with pt returning therapist demo as follows for R UE: flexion x 10 with 7 lbs, extension x 10 with 10 lbs, ER x 10 with 3 lbs v/c to keep elbow tucked Scapular stabilization on wall with ball x 10 reps in both directions Standing 1 arm row with 3 lb weight on L x 15 reps v/c to keep arm close to body Scapular retraction with red band x 20 reps  01/30/23 Standing single arm row x 10 with arm using other chair Standing single arm horizontal abduction x 10 Standing single arm shoulder shrug x 10 Ranger flexion x 6, abduction x 4 then some increased pain Supine pro/ret x 6 Pt reports doing most of these at home [place] anterior approach handout as follows seated in front of mirror: short neck, 5 diaphragmatic breaths, bilateral axillary nodes, bilateral pectoral nodes, anterior chest, short neck, posterior neck moving fluid towards pathway aimed  at lateral neck, then lateral and anterior neck moving fluid towards pathway aimed at lateral neck then retracing all steps. Had pt return demonstrate each step while therapist provided v/c and t/c for direction of skin stretch, pressure and sequence.   01/27/23:  Continued instructing pt using [place] anterior approach handout as follows seated in front of mirror: short neck, 5 diaphragmatic breaths, bilateral axillary nodes, bilateral pectoral nodes, anterior chest, short neck, posterior neck moving fluid towards pathway aimed at lateral neck, then lateral and anterior neck moving fluid towards pathway aimed at lateral neck then retracing all steps. Had pt return demonstrate each step while therapist provided v/c and t/c for direction of skin stretch, pressure and sequence.  Then supine additional work to the neck by PT  01/25/23:  Remeasured circumferences Instructed pt using [place] anterior approach handout as follows seated in front of mirror: short neck, 5 diaphragmatic breaths, bilateral axillary nodes, bilateral pectoral nodes, anterior chest, short neck, posterior neck moving fluid towards pathway aimed at lateral neck, then lateral and anterior neck moving fluid towards pathway aimed at lateral neck then retracing all steps. Educated pt in anatomy and physiology of the lymphatic system and had pt return demonstrate each step while therapist provided v/c and t/c for direction of skin stretch, pressure and sequence.  Assisted pt with re donning his compression garment at the end of the session  PATIENT EDUCATION:  Education details: lymphedema eduction and need for compression garment and MLD, wearing chip pack at least 4 hours/day Person educated: Patient Education method: Explanation Education comprehension: verbalized understanding  HOME EXERCISE PROGRAM: Reviewed previously given post op HEP.- sidelying abduction AROM, flexion AROM, ER AROM, retractions Wear chip pack for at least 4  hours/day - don't tie too tightly  ASSESSMENT:  CLINICAL IMPRESSION: Assessed pt's progress towards goals in therapy. He has met his MLD goals and his swelling is much more controlled. He is very compliant with wearing his compression garment. His ROM is improving since last visit and has been improving since his eval but he has not met his goals. He would benefit from additional skilled PT services to continue to improve R shoulder ROM and strength to allow improved functional use of R UE with decrease discomfort.   Pt will benefit from skilled therapeutic intervention to improve on the following deficits: decreased knowledge of condition, decreased knowledge of use of DME, decreased ROM, decreased strength, increased edema, increased fascial restrictions, impaired UE functional  use, postural dysfunction, and pain  PT treatment/interventions: ADL/Self care home management, [phone]- PT Re-evaluation, 97110-Therapeutic exercises, 97530- Therapeutic activity, 97535- Self Care, [id number]- Manual therapy, 97760- Orthotic Fit/training, Joint mobilization, Manual lymph drainage, Scar mobilization, and Compression bandaging    GOALS: Goals reviewed with patient? Yes  LONG TERM GOALS:  (STG=LTG) GOALS Name Target Date  Goal status  1 Pt will demonstrate a return to baseline cervical ROM measurements and not demonstrate any signs or symptoms of lymphedema. Baseline: 02/28/23 ONGOING 02/28/23  2 Pt will be indepndent in self MLD for long term managemetn of lymphedema. 02/28/23 MET 02/28/23  3 Pt will obtain a compression garment for long term management of lymphedema. 02/28/23 MET 02/28/23  4 Pt will demonstrate 150 degrees of R shoulder flexion to allow him to reach overhead. 02/28/23 ONGOING 02/28/23 - 120  5 Pt will demonstrate 150 degrees of R shoulder abduction to allow him to reach out to the side. 02/28/23 ONGOING 02/28/23 - 115  6 Pt will be independent in a home exercise program for long term stretching and  strengthening.  02/28/23 ONGOING 02/28/23        PLAN:  PT FREQUENCY/DURATION: 2x/wk for 4 wks  PLAN FOR NEXT SESSION:continue R UE strengthening to help improve ROM and focus on posture   [facility]  [address], [address]  [place] [address]  [phone]   Scar massage You can begin gentle scar massage to you incision sites. Gently place one hand on the incision and move the skin (without sliding on the skin) in various directions. Do this for a few minutes and then you can gently massage either coconut oil or vitamin E cream into the scars.  Home exercise Program Continue doing the exercises you were given until you feel like you can do them without feeling any tightness at the end. It is best to do them for several months after completion of radiation since the effects of radiation continue past completion.   Walking Program Studies show that 30 minutes of walking per day (fast enough to elevate your heart rate) can significantly reduce the risk of a cancer recurrence. If you can't walk due to other medical reasons, we encourage you to find another activity you could do (like a stationary bike or water exercise).  Posture After treatment for head and neck cancer, people frequently sit with rounded shoulders and forward head posture because the front of the neck has become tight and it feels better. If you sit like this, you can become very tight and have pain in sitting or standing with good posture. Try to be aware of your posture and sit and stand up tall to heal properly.  Follow up PT: Please let you doctor know as soon as possible if you develop any swelling in your face or neck in the future. Lymphedema (swelling) can occur months after completion of radiation. The sooner you can let the doctor know, the sooner they can refer you back to PT. It is much easier to treat the swelling early on.   [facility], PT 02/28/2023, 12:01 PM

## 2023-03-02 ENCOUNTER — Encounter: Payer: Self-pay | Admitting: Physical Therapy

## 2023-03-02 ENCOUNTER — Ambulatory Visit: Payer: Medicare HMO | Admitting: Physical Therapy

## 2023-03-02 DIAGNOSIS — I890 Lymphedema, not elsewhere classified: Secondary | ICD-10-CM

## 2023-03-02 DIAGNOSIS — M25611 Stiffness of right shoulder, not elsewhere classified: Secondary | ICD-10-CM

## 2023-03-02 DIAGNOSIS — C12 Malignant neoplasm of pyriform sinus: Secondary | ICD-10-CM

## 2023-03-02 DIAGNOSIS — M25511 Pain in right shoulder: Secondary | ICD-10-CM

## 2023-03-02 DIAGNOSIS — R293 Abnormal posture: Secondary | ICD-10-CM

## 2023-03-02 NOTE — Therapy (Addendum)
 OUTPATIENT PHYSICAL THERAPY HEAD AND NECK POST RADIATION TREATMENT   Patient Name: Randall [name] MRN: [id number] DOB:1955-08-28, 68 y.o., male Today's Date: 03/02/2023  END OF SESSION:  PT End of Session - 03/02/23 1403     Visit Number 11    Number of Visits 18    Date for PT Re-Evaluation 03/28/23    PT Start Time 1402    PT Stop Time 1450    PT Time Calculation (min) 48 min    Activity Tolerance Patient tolerated treatment well    Behavior During Therapy [facility] for tasks assessed/performed                 Past Medical History:  Diagnosis Date   Complication of anesthesia    Diabetes mellitus without complication (HCC)    Elevated coronary artery calcium  score 02/02/2021   GERD (gastroesophageal reflux disease)    History of kidney stones 2014   Hypertension    Kidney stone 06/17/2015   PONV (postoperative nausea and vomiting)    Rupture of right quadriceps tendon 09/21/2020   - s/p srugical repair   Tuberculosis 1980   Type 2 diabetes mellitus without complication, without long-term current use of insulin  (HCC) 03/21/2020   Past Surgical History:  Procedure Laterality Date   KIDNEY SURGERY     KNEE SURGERY     LITHOTRIPSY     MICROLARYNGOSCOPY Bilateral 06/17/2022   Procedure: DIRECT LARYNGOSCOPY WITH BIOPSY;  Surgeon: Llewellyn Gerard LABOR, DO;  Location: [facility] OR;  Service: ENT;  Laterality: Bilateral;   QUADRICEPS TENDON REPAIR Right 09/25/2020   Procedure: REPAIR RIGHT QUADRICEP TENDON;  Surgeon: Harden Jerona GAILS, MD;  Location: [facility] OR;  Service: Orthopedics;  Laterality: Right;   TONSILLECTOMY     Patient Active Problem List   Diagnosis Date Noted   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   Hyperlipidemia LDL goal <70 02/21/2022   Elevated coronary artery calcium  score 01/26/2021   Thoracic aortic atherosclerosis (HCC) 01/26/2021   Decreased left ventricular systolic function 12/25/2020   Resistant hypertension 11/13/2020    Right bundle branch block 11/13/2020   Hypertensive heart disease with chronic diastolic congestive heart failure (HCC) 11/13/2020   Quadriceps tendon rupture, right, sequela     PCP: Prentice Blush, MD  REFERRING PROVIDER: Lauraine Golden, MD  REFERRING DIAG: C12 (ICD-10-CM) - Pyriform sinus cancer [facility])   THERAPY DIAG:  Lymphedema, not elsewhere classified  Stiffness of right shoulder, not elsewhere classified  Acute pain of right shoulder  Abnormal posture  Pyriform sinus cancer (HCC)  Rationale for Evaluation and Treatment: Rehabilitation  ONSET DATE: 06/17/22  SUBJECTIVE:  SUBJECTIVE STATEMENT: I had a rough night last night. I tried sleeping in the bed and my neck, back and shoulders were hurting. My knees also hurt. It might be the barometric pressure. I did not feel safe to take a warm shower. I did not feel stable. I am feeling better now.   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 He presented with bilateral neck swelling in Jan 2024. He was also diagnosed with COVID around that time and he attributed his symptoms to that. The right neck swelling continued and he presented to his PCP in April 2024 for further evaluation. 05/05/22 A soft tissue neck US  demonstrated a nonspecific 3.2 x 1.5 x 2.5 cm hypoechoic lesion in the right neck, correlating with the palpable site of concern. 05/25/22 CT neck demonstrated a soft tissue mass centered within the right piriform sinus, measuring up to 22 x 8 mm, concerning for malignancy. CT also demonstrated a right level 3 lymph node measuring up to 2.8 x 1.8 cm, with central necrosis (correlating with the hypoechoic mass seen on neck ultrasound) suspicious for metastatic disease, and indeterminate  asymmetric prominence of right level 2A lymph node, measuring up to 16 x 10 mm. 06/14/22 He saw Dr. Llewellyn. Laryngoscopy performed during this visit revealed a mass lesion of the right piriform sinus which appeared to extend in to the arytenoid and partially onto the area epiglottic fold. 06/17/22 He underwent direct laryngoscopy to obtain biopsies. Biopsy of the right pyriform sinus showed findings consistent with invasive poorly differentiated squamous cell carcinoma (nonkeratinizing). A post cricoid biopsy was also collected and showed no evidence of malignancy. 06/28/22 He met with Dr. Lauralee to discuss treatment options- chemo/radiation vs surgery with possible post op radiation. 07/07/22 PET demonstrated the right piriform sinus primary measuring 1.4 cm with an SUV max of 5.9, and the known level 3 ipsilateral nodal metastasis measuring 2.7 cm with an SUV max of 3.2. PET otherwise showed no evidence of extracervical hypermetabolic metastatic disease, and a nonspecific 2 mm LLL pulmonary nodule. He met with Dr. Loretha on 6/28 to discuss the option of chemotherapy but ultimately decided to proceed with surgery. 08/26/22 endoscopic CO2 laser excision of the hypopharyngeal mass and bilateral neck dissection by Dr. Lauralee. Completed radiation 11/8.   PATIENT GOALS:  Reassess how my recovery is going related to neck ROM, cervical pain, fatigue, and swelling.  PAIN:  Are you having pain? Yes NPRS scale: 7/10 Pain location: R neck and shoulder Pain orientation: Right  PAIN TYPE: tight, just hurts Pain description: intermittent  Aggravating factors: trying to use it Relieving factors: pain medication  PRECAUTIONS: Recent radiation, Head and neck lymphedema risk,    OBJECTIVE:   POSTURE:  Forward head and rounded shoulders posture   30 SEC SIT TO STAND: 01/17/23: injured R knee when he fell so not tested today At eval on 10/13/22- 14 reps in 30 sec without use of UEs which is  Average for patient's  age   SHOULDER AROM:   At eval: Impaired  L shoulder ROM is WFL, R shoulder ROM is less than 50% of WNL  UPPER EXTREMITY AROM/PROM:  A/PROM RIGHT   eval  01/30/23 02/14/23 02/28/23  Shoulder extension 49     Shoulder flexion 94 115 111 120  Shoulder abduction 56 70 72 115  Shoulder internal rotation      Shoulder external rotation        (Blank rows = not  tested)  A/PROM LEFT   eval  Shoulder extension 69  Shoulder flexion 157  Shoulder abduction 170  Shoulder internal rotation   Shoulder external rotation     (Blank rows = not tested)     CERVICAL AROM:     Percent limited 10/13/22 at eval 01/17/23 02/28/23  Flexion WFL WFL WFL  Extension 25% limited 25% limited 25% limited  Right lateral flexion 50% limited 25% limited 25% limited  Left lateral flexion [facility] WFL  Right rotation 25% limited WFL 25% limited  Left rotation 25% limited WFL WFL                          (Blank rows=not tested)  LYMPHEDEMA ASSESSMENT:    Circumference in cm 01/25/23 02/28/23  4 cm superior to sternal notch around neck 40.6 40.5 39.5  6 cm superior to sternal notch around neck 42.3 41.5 39.7  8 cm superior to sternal notch around neck 43.3 42.5 41  R lateral nostril from base of nose to medial tragus     L lateral nostril from base of nose to medial tragus     R corner of mouth to where ear lobe meets face     L corner of mouth to where ear lobe meets face               (Blank rows=not tested)  CURRENT/PAST TREATMENTS:  Surgery type/date: 08/26/22 bilateral neck dissection   Chemotherapy: did not do  Radiation: completed 11/18/22  OTHER SYMPTOMS: Pain Yes Fibrosis No Pitting edema No Infections No Decreased scar mobility Yes  TREATMENT PERFORMED: 03/02/23 Manual therapy: PROM in supine in to cervical rotation bilaterally and cervical lateral flexion bilaterally  IASTM with cocoa butter using handlebar tool at upper traps and boomerang for lateral neck and rhomboids with numerous  areas of muscle tightness noted that improved with IASTM Therapeutic Exercise:  Stretches in to L lateral flexion x 30 sec, R lateral flexion x 30 sec, L rotation x 30 sec and R rotation x 30 sec x 2 sets Pulleys into flexion x and scaption x v/c for slow and controlled movement and to avoid compensatory strategies  Dual cable cross machine with pt returning therapist demo as follows for R UE: flexion x 10 with 3 lbs, extension x 10 with 7 lbs, ER x 10 with 3 lbs v/c to keep elbow tucked, scapular retraction x 10 reps with 7 lbs with v/c for posture throughout Scapular stabilization on wall with green weighted ball tracing alphabet on wall with RUE with v/c for posture  02/28/23 Assessed progress towards goals in therapy Therapeutic Exercise:  Pulleys into flexion x and scaption x v/c for slow and controlled movement and to avoid compensatory strategies  Dual cable cross machine with pt returning therapist demo as follows for R UE: flexion x 10 with 3 lbs, extension x 10 with 3 lbs, ER x 10 with 3 lbs v/c to keep elbow tucked, scapular retraction x 15 reps with 3 lbs with v/c for posture throughout Scapular stabilization on wall with green weighted ball tracing alphabet on wall with RUE with v/c for posture  02/16/23 Therapeutic Exercise:  Pulleys into flexion x and scaption x v/c for slow and controlled movement and to avoid compensatory strategies  Resisted isometric walkouts using dual cable cross machine with pt returning therapist demo as follows for R UE: flexion x 10 with 3 lbs,  extension x 10 with 3 lbs, ER x 10 with 3 lbs v/c to keep elbow tucked, scapular retraction x 10 reps with 3 lbs with v/c for posture throughout Scapular stabilization on wall with green weighted ball tracing alphabet on wall with RUE with v/c for posture Supine on large mat: AAROM with dowel in to shoulder flexion x 15 reps and in to R shoulder abduction x 15 reps and ER with pt returning  therapist demo, then lying with arm outstretched for a pec stretch Discussed need to increase rest because pt is never out of pain with his shoulder and does not get much rest at night. He is doing jobs around his house and using his shoulder frequently.    02/14/23 Therapeutic Exercise:  Pulleys into flexion x and scaption x v/c for slow and controlled movement and to avoid compensatory strategies  Resisted isometric walkouts using dual cable cross machine with pt returning therapist demo as follows for R UE: flexion x 10 with 3 lbs, extension x 10 with 3 lbs, ER x 10 with 3 lbs v/c to keep elbow tucked, scapular retraction x 10 reps with 3 lbs with v/c for posture throughout Scapular stabilization on wall with green weighted ball tracing alphabet on wall with RUE with v/c for posture Attempted exercise supine over foam roll but pt unable to tolerate due to back pain Supine on large mat: alternating shoulder flexion with increased stretch felt across chest, lying with UEs in abduction for pec stretch, snow angels but in limited range due to tightness with holds at end range - encouraged pt to begin to do these at home due to increased pec tightness 02/14/23 Therapeutic Exercise:  Pulleys into flexion x and scaption x v/c for slow and controlled movement and to avoid compensatory strategies  Resisted isometric walkouts using dual cable cross machine with pt returning therapist demo as follows for R UE: flexion x 10 with 3 lbs, extension x 10 with 3 lbs, ER x 10 with 3 lbs v/c to keep elbow tucked, scapular retraction x 10 reps with 3 lbs with v/c for posture throughout Scapular stabilization on wall with green weighted ball tracing alphabet on wall with RUE with v/c for posture Attempted exercise supine over foam roll but pt unable to tolerate due to back pain Supine on large mat: alternating shoulder flexion with increased stretch felt across chest, lying with UEs in abduction for  pec stretch, snow angels but in limited range due to tightness with holds at end range - encouraged pt to begin to do these at home due to increased pec tightness   02/09/23 Therapeutic Exercise:  Pulleys into flexion x and scaption x with initial instruction and cueing for ROM and warm up Resisted isometric walkouts using dual cable cross machine with pt returning therapist demo as follows for R UE: flexion x 10 with 3 lbs, extension x 10 with 3 lbs, ER x 10 with 3 lbs v/c to keep elbow tucked, scapular retraction x 10 reps with 3 lbs with v/c for posture throughout Scapular stabilization on wall with ball tracing alphabet on wall with RUE with v/c for posture IASTM with wave tool In L sidelying to R rhomboids, R upper traps, R levator, and R lateral cervical muscles with no tenderness today. therapist was able to use normal pressure without discomfort    02/07/23 Resisted isometric walkouts using dual cable cross machine with pt returning therapist demo as follows for R  UE: flexion x 10 with 3 lbs, extension x 10 with 3 lbs, ER x 10 with 3 lbs v/c to keep elbow tucked, scapular retraction x 10 reps with 3 lbs with v/c for posture throughout Scapular stabilization on wall with ball tracing alphabet on wall with RUE with v/c for posture IASTM with wave tool In L sidelying to R rhomboids, R upper traps, R levator, and R lateral cervical muscles with multiple areas of muscle tightness noted and therapist unable to use much pressure but by end of session muscle tightness had improved greatly and therapist was able to use normal pressure without discomfort  Educated pt about using tennis ball on wall for self STM at home  02/02/23: Sidelying R shoulder abduction x 10 Sidelying R shoulder flexion x 10 Sidelying R shoulder ER with towel at elbow x 10 - v/c to keep elbow tucked Resisted isometric walkouts using dual cable cross machine with pt returning therapist demo as follows for R UE:  flexion x 10 with 7 lbs, extension x 10 with 10 lbs, ER x 10 with 3 lbs v/c to keep elbow tucked Scapular stabilization on wall with ball x 10 reps in both directions Standing 1 arm row with 3 lb weight on L x 15 reps v/c to keep arm close to body Scapular retraction with red band x 20 reps  01/30/23 Standing single arm row x 10 with arm using other chair Standing single arm horizontal abduction x 10 Standing single arm shoulder shrug x 10 Ranger flexion x 6, abduction x 4 then some increased pain Supine pro/ret x 6 Pt reports doing most of these at home [place] anterior approach handout as follows seated in front of mirror: short neck, 5 diaphragmatic breaths, bilateral axillary nodes, bilateral pectoral nodes, anterior chest, short neck, posterior neck moving fluid towards pathway aimed at lateral neck, then lateral and anterior neck moving fluid towards pathway aimed at lateral neck then retracing all steps. Had pt return demonstrate each step while therapist provided v/c and t/c for direction of skin stretch, pressure and sequence.   01/27/23:  Continued instructing pt using [place] anterior approach handout as follows seated in front of mirror: short neck, 5 diaphragmatic breaths, bilateral axillary nodes, bilateral pectoral nodes, anterior chest, short neck, posterior neck moving fluid towards pathway aimed at lateral neck, then lateral and anterior neck moving fluid towards pathway aimed at lateral neck then retracing all steps. Had pt return demonstrate each step while therapist provided v/c and t/c for direction of skin stretch, pressure and sequence.  Then supine additional work to the neck by PT  01/25/23:  Remeasured circumferences Instructed pt using [place] anterior approach handout as follows seated in front of mirror: short neck, 5 diaphragmatic breaths, bilateral axillary nodes, bilateral pectoral nodes, anterior chest, short neck, posterior neck moving fluid towards pathway aimed at  lateral neck, then lateral and anterior neck moving fluid towards pathway aimed at lateral neck then retracing all steps. Educated pt in anatomy and physiology of the lymphatic system and had pt return demonstrate each step while therapist provided v/c and t/c for direction of skin stretch, pressure and sequence.  Assisted pt with re donning his compression garment at the end of the session  PATIENT EDUCATION:  Education details: lymphedema eduction and need for compression garment and MLD, wearing chip pack at least 4 hours/day Person educated: Patient Education method: Explanation Education comprehension: verbalized understanding  HOME EXERCISE PROGRAM: Reviewed previously given post op HEP.- sidelying abduction AROM,  flexion AROM, ER AROM, retractions Wear chip pack for at least 4 hours/day - don't tie too tightly  ASSESSMENT:  CLINICAL IMPRESSION: Pt reports he tried to sleep last night because he was so tired. He usually does not lie in bed but he tried last night and had increased pain in his neck and shoulder. Used IASTM today to decrease muscle tightness and pain. Pt reports pain was decreased to a 6.5 by end of session. He has numerous areas of muscle tightness noted in upper traps, sub occipitals, lateral cervical muscles and rhomboids. Added stretching and strengthening afterwards to help improve posture and to improve gleno humeral rhythm to allow pt to raise arm overhead.    Pt will benefit from skilled therapeutic intervention to improve on the following deficits: decreased knowledge of condition, decreased knowledge of use of DME, decreased ROM, decreased strength, increased edema, increased fascial restrictions, impaired UE functional use, postural dysfunction, and pain  PT treatment/interventions: ADL/Self care home management, [id number]- PT Re-evaluation, 97110-Therapeutic exercises, 97530- Therapeutic activity, 97535- Self Care, 02859- Manual therapy, 97760- Orthotic Fit/training,  Joint mobilization, Manual lymph drainage, Scar mobilization, and Compression bandaging    GOALS: Goals reviewed with patient? Yes  LONG TERM GOALS:  (STG=LTG) GOALS Name Target Date  Goal status  1 Pt will demonstrate a return to baseline cervical ROM measurements and not demonstrate any signs or symptoms of lymphedema. Baseline: 02/28/23 ONGOING 02/28/23  2 Pt will be indepndent in self MLD for long term managemetn of lymphedema. 02/28/23 MET 02/28/23  3 Pt will obtain a compression garment for long term management of lymphedema. 02/28/23 MET 02/28/23  4 Pt will demonstrate 150 degrees of R shoulder flexion to allow him to reach overhead. 02/28/23 ONGOING 02/28/23 - 120  5 Pt will demonstrate 150 degrees of R shoulder abduction to allow him to reach out to the side. 02/28/23 ONGOING 02/28/23 - 115  6 Pt will be independent in a home exercise program for long term stretching and strengthening.  02/28/23 ONGOING 02/28/23        PLAN:  PT FREQUENCY/DURATION: 2x/wk for 4 wks  PLAN FOR NEXT SESSION:continue R UE strengthening to help improve ROM and focus on posture, IASTM   [facility]  [address], [address]  [place] [address]  [phone]   Scar massage You can begin gentle scar massage to you incision sites. Gently place one hand on the incision and move the skin (without sliding on the skin) in various directions. Do this for a few minutes and then you can gently massage either coconut oil or vitamin E cream into the scars.  Home exercise Program Continue doing the exercises you were given until you feel like you can do them without feeling any tightness at the end. It is best to do them for several months after completion of radiation since the effects of radiation continue past completion.   Walking Program Studies show that 30 minutes of walking per day (fast enough to elevate your heart rate) can significantly reduce the risk of a cancer recurrence. If  you can't walk due to other medical reasons, we encourage you to find another activity you could do (like a stationary bike or water exercise).  Posture After treatment for head and neck cancer, people frequently sit with rounded shoulders and forward head posture because the front of the neck has become tight and it feels better. If you sit like this, you can become very tight and have pain in  sitting or standing with good posture. Try to be aware of your posture and sit and stand up tall to heal properly.  Follow up PT: Please let you doctor know as soon as possible if you develop any swelling in your face or neck in the future. Lymphedema (swelling) can occur months after completion of radiation. The sooner you can let the doctor know, the sooner they can refer you back to PT. It is much easier to treat the swelling early on.   [address] [place], [place] 03/02/2023, 3:24 PM  PHYSICAL THERAPY DISCHARGE SUMMARY  Visits from Start of Care: 11  Current functional level related to goals / functional outcomes: See above   Remaining deficits: See above    Education / Equipment: HEP, self MLD, compression garment   Patient agrees to discharge. Patient goals were partially met. Patient is being discharged due to not returning since the last visit.  Florina Sever [place], [place] 08/03/23 11:23 AM

## 2023-03-03 ENCOUNTER — Other Ambulatory Visit: Payer: Self-pay | Admitting: Radiation Oncology

## 2023-03-07 ENCOUNTER — Encounter: Payer: Self-pay | Admitting: Radiation Oncology

## 2023-03-07 NOTE — Progress Notes (Signed)
 Oncology Nurse Navigator Documentation   I called Mr. Ricardo just now in regards to his Mychart request today for a refill of pain medication. He was prescribed 600 ml of hydrocodone-aceptaminophen (Hycet) on 2/17 to take 10 ml three times daily not to exceed 30 ml a day by Dr. Basilio Cairo. I asked him if he received the total 600 ml on 2/17 and he wasn't able to tell me if he had so I offered to call the pharmacy to verify and see if he had gotten the correct volume. I explained that per the prescription parameters he should get his next refill on 3/8 if he received the total 600 ml and that Dr. Basilio Cairo would send in a new refill as appropriate. He became upset and felt like I was "policing him" and said he would find a new provider to help with his pain. I also tried to explain that he could call Dr. Greggory Stallion office because his pain may also be related to post operative pain but he hung up on me before he heard my advice. I have made Dr. Basilio Cairo aware of my conversation with Mr. Madan. He has my direct number to call if I can help him in anyway.  Hedda Slade RN, BSN, OCN Head & Neck Oncology Nurse Navigator [facility] Phone # [phone]  Fax # [phone]

## 2023-03-13 ENCOUNTER — Ambulatory Visit: Payer: Medicare HMO | Attending: Radiation Oncology

## 2023-03-13 DIAGNOSIS — R1310 Dysphagia, unspecified: Secondary | ICD-10-CM | POA: Diagnosis present

## 2023-03-13 NOTE — Therapy (Signed)
 OUTPATIENT SPEECH LANGUAGE PATHOLOGY ONCOLOGY TREATMENT/RECERTIFICATION   Patient Name: Randall [name] MRN: [id number] DOB:[birth year], 68 y.o., male Today's Date: 03/13/2023  PCP: Benedetto Goad, MD REFERRING PROVIDER: Lonie Peak, MD  END OF SESSION:     Past Medical History:  Diagnosis Date   Complication of anesthesia    Diabetes mellitus without complication (HCC)    Elevated coronary artery calcium score 02/02/2021   GERD (gastroesophageal reflux disease)    History of kidney stones 2014   Hypertension    Kidney stone 06/17/2015   PONV (postoperative nausea and vomiting)    Rupture of right quadriceps tendon 09/21/2020   - s/p srugical repair   Tuberculosis 1980   Type 2 diabetes mellitus without complication, without long-term current use of insulin (HCC) 03/21/2020   Past Surgical History:  Procedure Laterality Date   KIDNEY SURGERY     KNEE SURGERY     LITHOTRIPSY     MICROLARYNGOSCOPY Bilateral 06/17/2022   Procedure: DIRECT LARYNGOSCOPY WITH BIOPSY;  Surgeon: Laren Boom, DO;  Location: [facility] OR;  Service: ENT;  Laterality: Bilateral;   QUADRICEPS TENDON REPAIR Right 09/25/2020   Procedure: REPAIR RIGHT QUADRICEP TENDON;  Surgeon: Nadara Mustard, MD;  Location: [facility] OR;  Service: Orthopedics;  Laterality: Right;   TONSILLECTOMY     Patient Active Problem List   Diagnosis Date Noted   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   Hyperlipidemia LDL goal <70 02/21/2022   Elevated coronary artery calcium score 01/26/2021   Thoracic aortic atherosclerosis (HCC) 01/26/2021   Decreased left ventricular systolic function 12/25/2020   Resistant hypertension 11/13/2020   Right bundle branch block 11/13/2020   Hypertensive heart disease with chronic diastolic congestive heart failure (HCC) 11/13/2020   Quadriceps tendon rupture, right, sequela    Speech Therapy Progress Note  Dates of Reporting Period: 10/13/22 to present  Subjective  Statement: Pt has been seen for 3 ST visits targeting swallowing in light of radiation tx for head/neck cancer.  Objective: See below.  Goal Update: See below.  Plan: Pt will likely be seen for 1-2 more visits prior to d/c  Reason Skilled Services are Required: Ensure progress over time.     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 Hx: He presented with bilateral neck swelling in Jan 2024. He was also diagnosed with COVID around that time and he attributed his symptoms to that. The right neck swelling continued and he presented to his PCP in April 2024 for further evaluation. 05/05/22 A soft tissue neck US demonstrated a nonspecific 3.2 x 1.5 x 2.5 cm hypoechoic lesion in the right neck, correlating with the palpable site of concern. 05/25/22 CT neck demonstrated a soft tissue mass centered within the right piriform sinus,  measuring up to 22 x 8 mm, concerning for malignancy. CT also demonstrated a right level 3 lymph node measuring up to 2.8 x 1.8 cm, with central necrosis (correlating with the hypoechoic mass seen on neck ultrasound) suspicious for metastatic disease, and indeterminate asymmetric prominence of right level 2A lymph node, measuring up to 16 x 10 mm   06/14/22 He saw Dr. Marene Lenz. Laryngoscopy performed during this  visit revealed a mass lesion of the right piriform sinus which appeared to extend in to the arytenoid and partially onto the area epiglottic fold. 06/17/22 He underwent direct laryngoscopy to obtain biopsies. Biopsy of the right pyriform sinus showed findings consistent with invasive poorly differentiated squamous cell carcinoma  (nonkeratinizing). A post cricoid biopsy was also collected and showed no evidence of malignancy. 06/28/22 He met with Dr. Hezzie Bump to discuss treatment options- chemo/radiation vs surgery with possible post op radiation.07/04/22 Consult with Dr. Basilio Cairo. 07/07/22 PET demonstrated the right piriform sinus primary measuring 1.4 cm with an SUV max of 5.9, and the known level 3 ipsilateral nodal metastasis measuring 2.7 cm with an SUV max of 3.2. PET otherwise showed no evidence of extracervical hypermetabolic metastatic disease, and a nonspecific 2 mm LLL pulmonary nodule. He met with Dr. Al Pimple on 6/28 to discuss the option of chemotherapy but ultimately decided to proceed with surgery. 08/26/22 endoscopic CO2 laser excision of the hypopharyngeal mass and bilateral neck dissection by Dr. Hezzie Bump. Based on final pathology showing no evidence of extra nodal extension in the positive excised lymph node chemotherapy is not recommended only adjuvant radiation. 09/21/22 Follow up with Dr. Basilio Cairo. He will receive post operative radiation only. Treatment plan:  He will receive 35 fractions of radiation to his Hypopharynx and bilateral neck.  He started on 9/30 and he will complete 11/8  PAIN:  Are you having pain? Yes: NPRS scale: 810 Pain location: neck Pain description: constant; sore Aggravating factors: no meds Relieving factors: meds  FALLS: Has patient fallen in last 6 months?  No  PATIENT GOALS: "Do everything I can"  OBJECTIVE:  Note: Objective measures were completed at Evaluation unless otherwise noted.   TODAY'S TREATMENT:                                                                                                                                         DATE:   03/13/23:Pt ate fig bar and water today without overt s/sx oral or pharyngeal sx. Pt states that rarely he will cough with bready foods and liquids; SLP told pt to not mix solids and liquids at once, but wait until solid is out of oral cavity  prior to initiating liquid sips. Also recommended that pt take 1-3 drops of water if food is dry instead of sips/drinks. Pt was independent with HEP today - states that he is getting 20 reps/day of Effortful, Masako, "he", and Mendelsohn - SLP suggested 30 reps/day (6/day of Shaker) and pt stated he would do this. If pt presents similarly next session, d/c could be considered.  01/17/23: "I'm still having trouble with spicy food" (burns). Pt has eaten foods from a wide variety of textures in the last month, from dys I to regular diet. He IS avoiding crunchy foods like crackers and chips. Today he ate fig bar and water without any signs of oral  or overt s/sx pharyngeal deficits.  Pt is doing the HEP, reportedly as prescribed (20 reps/day except Shaker), at least 5 days/week. He was educated about when to decr to x2-3/week (req'd total A). His performance with HEP was done independently.   12/20/22:  Pt ate black cherry and raspberry yogurt this week. With POs today pt ate applesauce and drank water without any overt s/sx oral or pharyngeal dysphagia.  SLP provided pt with overt s/sx aspiration PNA and pt returned telling SLP 3 of these.  With HEP, pt's frequency has decreased due to pain. Procedure today was WNL; SLP strongly encouraged pt to incr reps to 20/day for each (except Shaker),  no less than 5 reps at a time.  11/17/22: Pt has been performing HEP as well as he can - is holding to scope of HEP as directed with reduced reps. He performed one rep of each exercise for SLP with independence.  He told SLP rationale for HEP with independence. SLP told pt and wife about food journal and pt told SLP benefits of this.   10/13/22 (eval): Research states the risk for dysphagia increases due to radiation and/or chemotherapy treatment due to a variety of factors, so SLP educated the pt about the possibility of reduced/limited ability for PO intake during rad tx. SLP also educated pt regarding possible changes to  swallowing musculature after rad tx, and why adherence to dysphagia HEP provided today and PO consumption was necessary to inhibit muscle fibrosis following rad tx and to mitigate muscle disuse atrophy. SLP informed pt why this would be detrimental to their swallowing status and to their pulmonary health. Pt demonstrated understanding of these things to SLP. SLP encouraged pt to safely eat and drink as deep into their radiation/chemotherapy as possible to provide the best possible long-term swallowing outcome for pt.  SLP then developed an individualized HEP for pt involving oral and pharyngeal strengthening and ROM and pt was instructed how to perform these exercises, including SLP demonstration. After SLP demonstration, pt return demonstrated each exercise. SLP ensured pt performance was correct prior to educating pt on next exercise. Pt required min cues faded to modified independent to perform HEP. Pt was instructed to complete this program 6-7 days/week, at least 2 times a day until 6 months after his or her last day of rad tx, and then x2 a week after that, indefinitely. Among other modifications for days when pt cannot functionally swallow, SLP also suggested pt to perform only non-swallowing tasks on the handout/HEP, and if necessary to cycle through the swallowing portion so the full program of exercises can be completed instead of fatiguing on one of the swallowing exercises and being unable to perform the other swallowing exercises. SLP instructed that swallowing exercises should then be added back into the regimen as pt is able to do so.   PATIENT EDUCATION: Education details: see "today's treatment" Person educated: Patient Education method: See "today's treatment" Education comprehension: see "Today's treatment"  ASSESSMENT:  CLINICAL IMPRESSION: Pt's voice quality is normal. Patient is a 68 y.o. M who was seen today for treatment of swallowing following radiation/chemoradiation therapy.  Today pt ate fig bar, and drank thin liquids without overt s/sx oral or pharyngeal difficulty. Pt told SLP he has been falling a lot more recently and SLP told pt to tell Dr. Hezzie Bump this week, and his PT about this on Wednesday. At this time pt swallowing is deemed WNL/WFL with regular diet with caveats as pt is not eating highly crunchy  foods. There are no overt s/s aspiration PNA observed by SLP nor any reported by pt at this time. Data indicate that pt's swallow ability will likely decrease over the course of radiation/chemoradiation therapy and could very well decline over time following the conclusion of that therapy due to muscle disuse atrophy and/or muscle fibrosis. Pt will cont to need to be seen by SLP in order to assess safety of PO intake, assess the need for recommending any objective swallow assessment, and ensuring pt is correctly completing the individualized HEP. If pt presents similarly next session d/c will be considered.    OBJECTIVE IMPAIRMENTS: include dysphagia. These impairments are limiting patient from effectively communicating at home and in community and safety when swallowing. Factors affecting potential to achieve goals and functional outcome are  none noted . Patient will benefit from skilled SLP services to address above impairments and improve overall function.   REHAB POTENTIAL: Good   GOALS: Goals reviewed with patient? No   SHORT TERM GOALS: Target: 3rd total session   1. Pt will complete HEP with modified independence in 2 sessions Baseline: 11/17/22 Goal status: met   2.  pt will tell SLP why pt is completing HEP with modified independence Baseline:  Goal status: met   3.  pt will describe 3 overt s/s aspiration PNA with modified independence Baseline:  Goal status: met   4.  pt will tell SLP how a food journal could hasten return to a more normalized diet Baseline:  Goal status: met     LONG TERM GOALS: Target: 7th total session   1.  pt will  complete HEP with independence over two visits Baseline: 11//7/24 Goal status: Met   2.  pt will describe how to modify HEP over time, and the timeline associated with reduction in HEP frequency with modified independence over two sessions Baseline:  Goal status: INITIAL     PLAN:   SLP FREQUENCY:  once approx every 4 weeks   SLP DURATION:  7 total sessions   PLANNED INTERVENTIONS: Aspiration precaution training, Pharyngeal strengthening exercises, Diet toleration management , Trials of upgraded texture/liquids, SLP instruction and feedback, Compensatory strategies, and Patient/family education    [facility], CCC-SLP 03/13/2023, 11:12 AM

## 2023-03-15 ENCOUNTER — Ambulatory Visit: Payer: Medicare HMO | Admitting: Rehabilitation

## 2023-03-17 ENCOUNTER — Ambulatory Visit: Payer: Medicare HMO | Admitting: Rehabilitation

## 2023-03-21 ENCOUNTER — Encounter: Payer: Medicare HMO | Admitting: Physical Therapy

## 2023-03-23 ENCOUNTER — Encounter: Payer: Medicare HMO | Admitting: Rehabilitation

## 2023-03-27 ENCOUNTER — Encounter: Payer: Self-pay | Admitting: Cardiology

## 2023-03-27 ENCOUNTER — Ambulatory Visit: Payer: Medicare HMO | Attending: Cardiology | Admitting: Cardiology

## 2023-03-27 VITALS — BP 136/54 | HR 68 | Ht 70.0 in | Wt 189.0 lb

## 2023-03-27 DIAGNOSIS — R931 Abnormal findings on diagnostic imaging of heart and coronary circulation: Secondary | ICD-10-CM

## 2023-03-27 DIAGNOSIS — E785 Hyperlipidemia, unspecified: Secondary | ICD-10-CM

## 2023-03-27 DIAGNOSIS — I700 Atherosclerosis of aorta: Secondary | ICD-10-CM

## 2023-03-27 DIAGNOSIS — E118 Type 2 diabetes mellitus with unspecified complications: Secondary | ICD-10-CM

## 2023-03-27 DIAGNOSIS — I110 Hypertensive heart disease with heart failure: Secondary | ICD-10-CM

## 2023-03-27 DIAGNOSIS — N182 Chronic kidney disease, stage 2 (mild): Secondary | ICD-10-CM

## 2023-03-27 DIAGNOSIS — I5032 Chronic diastolic (congestive) heart failure: Secondary | ICD-10-CM

## 2023-03-27 DIAGNOSIS — J387 Other diseases of larynx: Secondary | ICD-10-CM

## 2023-03-27 NOTE — Progress Notes (Addendum)
 Cardiology Office Note:  .   Date:  03/29/2023  ID:  Randall [name], DOB 1955/02/29, MRN [id number] PCP: Tura Gaines, MD  [facility] Providers Cardiologist:  [name], MD     Chief Complaint  Patient presents with   Follow-up    Elevated coronary Score, hypertensive heart disease   Coronary Artery Disease    Coronary Calcium  Score 680    Patient Profile: .     Randall [name] is a  68 y.o. male  with a PMH notable for resistant hypertension with hypertensive heart disease/HFpEF, Coronary and Aortic Atherosclerosis (elevated CAC score) with Hyperlipidemia and DM-2 who presents here for 1 month follow-up at the request of Tura Gaines, MD.     Randall [name] was last seen on February 20, 2023 by Palmer Bobo, NP.  Thought to euvolemic NYHA class I symptoms.  No real edema.  Noted improvement in swelling since increase Lasix  dosing.  Was wearing leg stockings, elevating legs and limiting salt.  Was on carvedilol  25 mg twice daily and Lasix  40 g daily.  Amlodipine was discontinued due to hypotension. => Increased chlorthalidone  to 25 mg daily and continue telmisartan 80 mg daily  Subjective  Discussed the use of AI scribe software for clinical note transcription with the patient, who gave verbal consent to proceed.  History of Present Illness   Randall [name] "Randall [name]" is a 68 year old male who presents for follow-up regarding his Resistant Hypertension and Hypertensive Heart Disease along with other health concerns.  He has been monitoring his blood pressure daily, with recent readings around 135/59 mmHg. No chest pain, pressure, or shortness of breath. He is currently taking carvedilol , Micardis, chlorthalidone , and atorvastatin , and he has not needed extra doses of furosemide  for trivial edema.  No PND or orthopnea.   He has been active, engaging in physical activities such as climbing stairs and working in the yard, which he feels has contributed to his  overall well-being.    He has a history of squamous carcinoid cancer in his throat, for which he underwent surgery in August of the previous year. The surgery involved a larger incision than initially planned, and he subsequently received radiation therapy. Post-radiation, he was unable to speak for four months.  As a result, he is experiencing significant lymphedema, which he attributes to radiation treatment. He has been wearing a compression garment for over eleven weeks to manage fluid retention, although it was initially recommended for six weeks. He finds it challenging to massage the fluid out quickly enough. He is currently undergoing therapy to manage lymphedema and speech therapy to prevent muscle stiffness in his throat.  He reports occasional spells when he has a significant drop in blood sugar levels, noting a specific episode where it fell to 40 mg/dL, nearly resulting in a coma. He attributes his survival to the glucose monitor alarm, which alerted him to the low levels. His blood sugar has been fluctuating but has recently stabilized. His A1c was 6.9% at the last check, up from 5.6% previously. He has been reducing his diabetes medications, including Trulicity, which was decreased from 1.75 mg to 0.75 mg, and has stopped taking Amaryl and metformin. He experiences 'sinking spells' and dizziness, which he associates with low blood sugar episodes.  He reports a recent loss of hearing that occurred two weeks ago, though the cause is uncertain.  He experienced four weeks of diarrhea, which he attributes to food poisoning, accompanied by vomiting. During this  period, he was unable to leave the house due to the severity of his symptoms.  He reports a recent injury to his shins from a heavy object, which has caused soreness but is healing.       Objective   Medications - Trulicity 0.75 mg weekly - Carvedilol  25 mg twice daily  - Micardis 80 mg daily - Chlorthalidone  12.5 mg (1/2 of 25  mg tablet) - Furosemide  40 mg daily - Atorvastatin  20 mg daily -Protonix 40 mg daily  PRN cetirizine 10 mg daily, Mobic  15 mg daily, hydrocodone -acetaminophen   Studies Reviewed: Randall [name]       LABS Total cholesterol: 126 (02/27/2023) HDL: 47 (02/27/2023) LDL: 57 (02/27/2023) Triglycerides: 122 (02/27/2023) A1c: 5.6 (12/2022) Creatinine: 1.16 (02/27/2023) Potassium: 3.6 (02/27/2023)  DIAGNOSTIC ECHO November 2022:: EF 45 to 50%.  GR 1 DD. => Echo in January 2025 showed EF 55 to 60% with GR 2 DD. Improved ejection fraction  Coronary Calcium  Score 02/02/2018: CAC 683.  LAD 482, LCx 94, RCA 106.  87 percentile.  Risk Assessment/Calculations:        Physical Exam:   VS:  BP (!) 136/54   Pulse 68   Ht 5\' 10"  (1.778 m)   Wt 189 lb (85.7 kg)   SpO2 97%   BMI 27.12 kg/m    Wt Readings from Last 3 Encounters:  03/27/23 189 lb (85.7 kg)  02/20/23 197 lb (89.4 kg)  02/10/23 197 lb (89.4 kg)    GEN: Well nourished, well developed in no acute distress; => wearing Head /neck wrap for lymphedema.  NECK: No JVD; No carotid bruits CARDIAC: Normal S1, S2; RRR, no murmurs, rubs, gallops RESPIRATORY:  Clear to auscultation without rales, wheezing or rhonchi ; nonlabored, good air movement. ABDOMEN: Soft, non-tender, non-distended EXTREMITIES:  No edema; No deformity     ASSESSMENT AND PLAN: .    Problem List Items Addressed This Visit       Cardiology Problems   Elevated coronary artery calcium  score-683 - Primary (Chronic)   Elevated CAC but no active symptoms.  Very vigorous workouts at the gym with no chest pain pressure or dyspnea.  At this point I do not think any stress testing or coronary CTA is warranted. With his extensive CAD however target LDL to be less than 70.  Stressed importance of adequate BP and blood sugar control as well. Continue aspirin  81 mg daily along with atorvastatin  20 mg with excellent lipid control Continue stable dose of carvedilol  25 mg twice daily,  telmisartan 80 mg daily. Is on stable medications for DM-2 including Trulicity (could consider an GLP-1 agonist.  Also could consider SGLT2 inhibitor.--Defer to PCP.      Hyperlipidemia LDL goal <70 (Chronic)   Hyperlipidemia well-managed with atorvastatin . Recent lipid panel within target range with an LDL less of 7 - Continue atorvastatin  therapy.-20 mg daily - Monitor lipid levels as per routine schedule.      Hypertensive heart disease with chronic diastolic congestive heart failure (HCC) (Chronic)   Grade 1 to 2 diastolic dysfunction with heart failure. Echocardiogram shows slight improvement. Blood pressure well-controlled. No recent symptoms. Regular exercise without issues. - Continue current antihypertensive regimen including carvedilol  25 mg twice daily, Micardis 80 mg daily, and chlorthalidone  12.5 mg daily. - Follow up with cardiology in one year.      Thoracic aortic atherosclerosis (HCC) (Chronic)   Aggressive CV risk factor modification with BP, lipid and diabetes control.        Other  CKD (chronic kidney disease), symptom management only, stage 2 (mild) (Chronic)   Chronic kidney disease with creatinine at 1.16 mg/dL. Potassium slightly low at 3.6 mmol/L, likely due to chlorthalidone  and Lasix . - Monitor renal function and electrolytes regularly. - Ensure adequate potassium intake.      Laryngeal mass (Chronic)   Squamous carcinoid cancer treated with surgery and radiation. Undergoing speech therapy for post-treatment effects. - Continue speech therapy to maintain throat and muscle function.      Type 2 diabetes mellitus with complication, without long-term current use of insulin  (HCC) (Chronic)   Type 2 diabetes with recent hypoglycemic episodes. A1c increased from 5.6 to 6.9. Severe hypoglycemic event with glucose at 40 mg/dL detected by monitor. - Continue monitoring blood glucose levels closely. - Adjust diabetes medications as needed based on glucose  readings. - Ensure continuous use of glucose monitor.       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 Wearing compression garments and undergoing therapy. - Continue wearing compression garments as advised. - Continue therapy for lymphedema management.  Hearing loss Recent onset of hearing loss, possibly related to radiation therapy. Cause undetermined. - Consider referral to audiology if symptoms persist.   Follow-Up: Stable with current management. No acute issues.Return in about 1 year (around 03/26/2024) for Routine follow up with me, [employer].  Recording duration: 11 minutes  I spent 31 minutes in the care of Randall [name] today including reviewing outside labs from PCP as noted in the [facility] (3 minutes), reviewing studies (5 minutes), face to face time discussing treatment options (11 minutes), reviewing records from previous clinic notes (3 minutes), +9 minutes dictating and documenting in the encounter.   ADDENDUM    Randall [name]'s perioperative risk of a major cardiac event is 0.4% according to the Revised Cardiac Risk Index (RCRI).  Therefore, he is at low risk for perioperative complications.   His functional capacity is excellent at 8.97 METs according to the Duke Activity Status Index (DASI). Recommendations: According to ACC/AHA guidelines, no further cardiovascular testing needed.  The patient may proceed to surgery at acceptable risk.   Antiplatelet and/or Anticoagulation Recommendations: Aspirin  can be held for 5-7 days prior to his surgery.  Please resume Aspirin  post operatively when it is felt to be safe from a bleeding standpoint.  No other meds need to be held.      Signed, Arleen Lacer, MD, MS [name], M.D., M.S. Interventional Cardiologist  [facility]  Pager # [phone] Phone # [phone] [address] [place], [place] [address]

## 2023-03-27 NOTE — Patient Instructions (Signed)

## 2023-03-28 ENCOUNTER — Ambulatory Visit: Payer: Medicare HMO | Admitting: Physical Therapy

## 2023-03-29 ENCOUNTER — Encounter: Payer: Self-pay | Admitting: Cardiology

## 2023-03-29 DIAGNOSIS — N182 Chronic kidney disease, stage 2 (mild): Secondary | ICD-10-CM | POA: Insufficient documentation

## 2023-03-29 NOTE — Assessment & Plan Note (Signed)
 Aggressive CV risk factor modification with BP, lipid and diabetes control.

## 2023-03-29 NOTE — Assessment & Plan Note (Signed)
 Type 2 diabetes with recent hypoglycemic episodes. A1c increased from 5.6 to 6.9. Severe hypoglycemic event with glucose at 40 mg/dL detected by monitor. - Continue monitoring blood glucose levels closely. - Adjust diabetes medications as needed based on glucose readings. - Ensure continuous use of glucose monitor.

## 2023-03-29 NOTE — Assessment & Plan Note (Signed)
 Elevated CAC but no active symptoms.  Very vigorous workouts at the gym with no chest pain pressure or dyspnea.  At this point I do not think any stress testing or coronary CTA is warranted. With his extensive CAD however target LDL to be less than 70.  Stressed importance of adequate BP and blood sugar control as well. Continue aspirin 81 mg daily along with atorvastatin 20 mg with excellent lipid control Continue stable dose of carvedilol 25 mg twice daily, telmisartan 80 mg daily. Is on stable medications for DM-2 including Trulicity (could consider an GLP-1 agonist.  Also could consider SGLT2 inhibitor.--Defer to PCP.

## 2023-03-29 NOTE — Assessment & Plan Note (Signed)
 Chronic kidney disease with creatinine at 1.16 mg/dL. Potassium slightly low at 3.6 mmol/L, likely due to chlorthalidone and Lasix. - Monitor renal function and electrolytes regularly. - Ensure adequate potassium intake.

## 2023-03-29 NOTE — Assessment & Plan Note (Signed)
 Grade 1 to 2 diastolic dysfunction with heart failure. Echocardiogram shows slight improvement. Blood pressure well-controlled. No recent symptoms. Regular exercise without issues. - Continue current antihypertensive regimen including carvedilol 25 mg twice daily, Micardis 80 mg daily, and chlorthalidone 12.5 mg daily. - Follow up with cardiology in one year.

## 2023-03-29 NOTE — Assessment & Plan Note (Signed)
 Squamous carcinoid cancer treated with surgery and radiation. Undergoing speech therapy for post-treatment effects. - Continue speech therapy to maintain throat and muscle function.

## 2023-03-29 NOTE — Assessment & Plan Note (Signed)
 Hyperlipidemia well-managed with atorvastatin. Recent lipid panel within target range with an LDL less of 7 - Continue atorvastatin therapy.-20 mg daily - Monitor lipid levels as per routine schedule.

## 2023-03-30 ENCOUNTER — Ambulatory Visit: Payer: Medicare HMO | Admitting: Rehabilitation

## 2023-04-05 ENCOUNTER — Other Ambulatory Visit: Payer: Self-pay

## 2023-04-05 ENCOUNTER — Observation Stay (HOSPITAL_BASED_OUTPATIENT_CLINIC_OR_DEPARTMENT_OTHER)
Admission: EM | Admit: 2023-04-05 | Discharge: 2023-04-06 | Disposition: A | Discharge status: Home / Self Care | Attending: Internal Medicine | Admitting: Internal Medicine

## 2023-04-05 ENCOUNTER — Encounter (HOSPITAL_BASED_OUTPATIENT_CLINIC_OR_DEPARTMENT_OTHER): Payer: Self-pay

## 2023-04-05 DIAGNOSIS — E871 Hypo-osmolality and hyponatremia: Principal | ICD-10-CM | POA: Diagnosis present

## 2023-04-05 DIAGNOSIS — N183 Chronic kidney disease, stage 3 unspecified: Secondary | ICD-10-CM | POA: Insufficient documentation

## 2023-04-05 DIAGNOSIS — C12 Malignant neoplasm of pyriform sinus: Secondary | ICD-10-CM | POA: Diagnosis present

## 2023-04-05 DIAGNOSIS — E1122 Type 2 diabetes mellitus with diabetic chronic kidney disease: Secondary | ICD-10-CM | POA: Diagnosis not present

## 2023-04-05 DIAGNOSIS — I130 Hypertensive heart and chronic kidney disease with heart failure and stage 1 through stage 4 chronic kidney disease, or unspecified chronic kidney disease: Secondary | ICD-10-CM | POA: Insufficient documentation

## 2023-04-05 DIAGNOSIS — E785 Hyperlipidemia, unspecified: Secondary | ICD-10-CM | POA: Diagnosis not present

## 2023-04-05 DIAGNOSIS — D649 Anemia, unspecified: Secondary | ICD-10-CM | POA: Insufficient documentation

## 2023-04-05 DIAGNOSIS — Z7984 Long term (current) use of oral hypoglycemic drugs: Secondary | ICD-10-CM | POA: Diagnosis not present

## 2023-04-05 DIAGNOSIS — N179 Acute kidney failure, unspecified: Secondary | ICD-10-CM

## 2023-04-05 DIAGNOSIS — Z9889 Other specified postprocedural states: Secondary | ICD-10-CM | POA: Diagnosis not present

## 2023-04-05 DIAGNOSIS — E876 Hypokalemia: Secondary | ICD-10-CM | POA: Diagnosis not present

## 2023-04-05 DIAGNOSIS — Z96659 Presence of unspecified artificial knee joint: Secondary | ICD-10-CM | POA: Insufficient documentation

## 2023-04-05 DIAGNOSIS — E118 Type 2 diabetes mellitus with unspecified complications: Secondary | ICD-10-CM | POA: Diagnosis not present

## 2023-04-05 DIAGNOSIS — Z85818 Personal history of malignant neoplasm of other sites of lip, oral cavity, and pharynx: Secondary | ICD-10-CM | POA: Insufficient documentation

## 2023-04-05 DIAGNOSIS — Z7982 Long term (current) use of aspirin: Secondary | ICD-10-CM | POA: Insufficient documentation

## 2023-04-05 DIAGNOSIS — I700 Atherosclerosis of aorta: Secondary | ICD-10-CM | POA: Diagnosis present

## 2023-04-05 DIAGNOSIS — I5030 Unspecified diastolic (congestive) heart failure: Secondary | ICD-10-CM | POA: Diagnosis not present

## 2023-04-05 DIAGNOSIS — Z8521 Personal history of malignant neoplasm of larynx: Secondary | ICD-10-CM | POA: Diagnosis not present

## 2023-04-05 DIAGNOSIS — I10 Essential (primary) hypertension: Secondary | ICD-10-CM | POA: Diagnosis present

## 2023-04-05 DIAGNOSIS — E119 Type 2 diabetes mellitus without complications: Secondary | ICD-10-CM | POA: Diagnosis present

## 2023-04-05 DIAGNOSIS — I1A0 Resistant hypertension: Secondary | ICD-10-CM | POA: Insufficient documentation

## 2023-04-05 DIAGNOSIS — R531 Weakness: Secondary | ICD-10-CM | POA: Diagnosis present

## 2023-04-05 DIAGNOSIS — Z91040 Latex allergy status: Secondary | ICD-10-CM | POA: Diagnosis not present

## 2023-04-05 DIAGNOSIS — D631 Anemia in chronic kidney disease: Secondary | ICD-10-CM | POA: Diagnosis not present

## 2023-04-05 DIAGNOSIS — Z79899 Other long term (current) drug therapy: Secondary | ICD-10-CM | POA: Insufficient documentation

## 2023-04-05 DIAGNOSIS — N182 Chronic kidney disease, stage 2 (mild): Secondary | ICD-10-CM | POA: Diagnosis present

## 2023-04-05 HISTORY — DX: Heart failure, unspecified: I50.9

## 2023-04-05 LAB — CBC WITH DIFFERENTIAL/PLATELET
Abs Immature Granulocytes: 0.03 10*3/uL (ref 0.00–0.07)
Basophils Absolute: 0.1 10*3/uL (ref 0.0–0.1)
Basophils Relative: 1 %
Eosinophils Absolute: 0.2 10*3/uL (ref 0.0–0.5)
Eosinophils Relative: 2 %
HCT: 33.3 % — ABNORMAL LOW (ref 39.0–52.0)
Hemoglobin: 12.1 g/dL — ABNORMAL LOW (ref 13.0–17.0)
Immature Granulocytes: 0 %
Lymphocytes Relative: 9 %
Lymphs Abs: 0.8 10*3/uL (ref 0.7–4.0)
MCH: 31 pg (ref 26.0–34.0)
MCHC: 36.3 g/dL — ABNORMAL HIGH (ref 30.0–36.0)
MCV: 85.4 fL (ref 80.0–100.0)
Monocytes Absolute: 1.2 10*3/uL — ABNORMAL HIGH (ref 0.1–1.0)
Monocytes Relative: 13 %
Neutro Abs: 6.7 10*3/uL (ref 1.7–7.7)
Neutrophils Relative %: 75 %
Platelets: 212 10*3/uL (ref 150–400)
RBC: 3.9 MIL/uL — ABNORMAL LOW (ref 4.22–5.81)
RDW: 13 % (ref 11.5–15.5)
WBC: 9 10*3/uL (ref 4.0–10.5)
nRBC: 0 % (ref 0.0–0.2)

## 2023-04-05 LAB — COMPREHENSIVE METABOLIC PANEL
ALT: 13 U/L (ref 0–44)
AST: 15 U/L (ref 15–41)
Albumin: 4.6 g/dL (ref 3.5–5.0)
Alkaline Phosphatase: 46 U/L (ref 38–126)
Anion gap: 10 (ref 5–15)
BUN: 46 mg/dL — ABNORMAL HIGH (ref 8–23)
CO2: 27 mmol/L (ref 22–32)
Calcium: 9.1 mg/dL (ref 8.9–10.3)
Chloride: 83 mmol/L — ABNORMAL LOW (ref 98–111)
Creatinine, Ser: 1.47 mg/dL — ABNORMAL HIGH (ref 0.61–1.24)
GFR, Estimated: 52 mL/min — ABNORMAL LOW (ref >60)
Glucose, Bld: 105 mg/dL — ABNORMAL HIGH (ref 70–99)
Potassium: 2.9 mmol/L — ABNORMAL LOW (ref 3.5–5.1)
Sodium: 120 mmol/L — ABNORMAL LOW (ref 135–145)
Total Bilirubin: 1 mg/dL (ref 0.0–1.2)
Total Protein: 6.9 g/dL (ref 6.5–8.1)

## 2023-04-05 LAB — URINALYSIS, ROUTINE W REFLEX MICROSCOPIC
Bilirubin Urine: NEGATIVE
Glucose, UA: NEGATIVE mg/dL
Hgb urine dipstick: NEGATIVE
Ketones, ur: NEGATIVE mg/dL
Leukocytes,Ua: NEGATIVE
Nitrite: NEGATIVE
Protein, ur: NEGATIVE mg/dL
Specific Gravity, Urine: 1.007 (ref 1.005–1.030)
pH: 5.5 (ref 5.0–8.0)

## 2023-04-05 LAB — OSMOLALITY: Osmolality: 274 mosm/kg — ABNORMAL LOW (ref 275–295)

## 2023-04-05 LAB — MAGNESIUM: Magnesium: 1.9 mg/dL (ref 1.7–2.4)

## 2023-04-05 LAB — CREATININE, URINE, RANDOM: Creatinine, Urine: 33 mg/dL

## 2023-04-05 LAB — SODIUM, URINE, RANDOM: Sodium, Ur: 32 mmol/L

## 2023-04-05 LAB — GLUCOSE, CAPILLARY: Glucose-Capillary: 114 mg/dL — ABNORMAL HIGH (ref 70–99)

## 2023-04-05 MED ORDER — SODIUM CHLORIDE 0.9 % IV BOLUS
500.0000 mL | Freq: Once | INTRAVENOUS | Status: AC
  Administered 2023-04-05: 500 mL via INTRAVENOUS

## 2023-04-05 MED ORDER — ATORVASTATIN CALCIUM 10 MG PO TABS
20.0000 mg | ORAL_TABLET | Freq: Every day | ORAL | Status: DC
  Administered 2023-04-06: 20 mg via ORAL
  Filled 2023-04-05: qty 2

## 2023-04-05 MED ORDER — CARVEDILOL 25 MG PO TABS
25.0000 mg | ORAL_TABLET | Freq: Two times a day (BID) | ORAL | Status: DC
  Administered 2023-04-05 – 2023-04-06 (×2): 25 mg via ORAL
  Filled 2023-04-05 (×2): qty 1

## 2023-04-05 MED ORDER — ASPIRIN 81 MG PO TBEC
81.0000 mg | DELAYED_RELEASE_TABLET | Freq: Every day | ORAL | Status: DC
  Administered 2023-04-06: 81 mg via ORAL
  Filled 2023-04-05: qty 1

## 2023-04-05 MED ORDER — POTASSIUM CITRATE ER 10 MEQ (1080 MG) PO TBCR
10.0000 meq | EXTENDED_RELEASE_TABLET | Freq: Two times a day (BID) | ORAL | Status: DC
  Administered 2023-04-05 – 2023-04-06 (×2): 10 meq via ORAL
  Filled 2023-04-05 (×2): qty 1

## 2023-04-05 MED ORDER — POTASSIUM CHLORIDE CRYS ER 20 MEQ PO TBCR
40.0000 meq | EXTENDED_RELEASE_TABLET | Freq: Once | ORAL | Status: AC
  Administered 2023-04-05: 40 meq via ORAL
  Filled 2023-04-05: qty 2

## 2023-04-05 MED ORDER — IRBESARTAN 150 MG PO TABS
300.0000 mg | ORAL_TABLET | Freq: Every day | ORAL | Status: DC
  Administered 2023-04-06: 300 mg via ORAL
  Filled 2023-04-05: qty 2

## 2023-04-05 MED ORDER — ENOXAPARIN SODIUM 40 MG/0.4ML IJ SOSY
40.0000 mg | PREFILLED_SYRINGE | INTRAMUSCULAR | Status: DC
  Administered 2023-04-06: 40 mg via SUBCUTANEOUS
  Filled 2023-04-05: qty 0.4

## 2023-04-05 MED ORDER — SODIUM CHLORIDE 0.9 % IV SOLN: INTRAVENOUS | Status: DC

## 2023-04-05 MED ORDER — INSULIN ASPART 100 UNIT/ML IJ SOLN: 0.0000 [IU] | Freq: Three times a day (TID) | INTRAMUSCULAR | Status: DC

## 2023-04-05 NOTE — Plan of Care (Signed)
 Plan of Care Note for accepted transfer   Patient: Randall [name] MRN: [id number]   DOA: 04/05/2023  Facility requesting transfer: [facility] Requesting Provider: Dr. 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  Recently seen by cardiology on 02/20/2023.  Amlodipine discontinued due to hypotension in December 2024 and chlorthalidone has now been increased to 25 mg daily.  Remains on Coreg and telmisartan.  On lab check sodium was 120, potassium 2.9, creatinine 1.47 with normal creatinines noted the past 3 months. He was given 500 cc normal saline bolus and 40 mEq potassium replacement. He is admitted for further sodium monitoring, potassium repletion, and fluids for the AKI.   Plan of care: The patient is accepted for admission to Med-surg  unit, at [facility].  Author: Lewie Chamber, MD 04/05/2023  Check [email or web address] for on-call coverage.  Nursing staff, Please call [facility] Admits & Consults System-Wide number on Amion as soon as patient's arrival, so appropriate admitting provider can evaluate the pt.

## 2023-04-05 NOTE — ED Notes (Signed)
 [facility] called to transport patient to [facility] rm# [phone]

## 2023-04-05 NOTE — H&P (Signed)
 History and Physical    Randall [name] ZOX:[id number] DOB: Aug 15, 1955 DOA: 04/05/2023  Patient coming from: Home.  Chief Complaint: Weakness.  HPI: Randall [name] is a 68 y.o. 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  Patient states he has had recent multiple episodes of diarrhea after his hydrocodone was stopped and over the last 3 days he has been taking Imodium and has not had a bowel movement since then.  Denies any nausea vomiting but has been having poor appetite.  Patient recently visited his cardiology office about a week ago when patient's chlorthalidone dose was increased.  Denies any chest pain shortness of breath productive cough.  Few weeks ago patient also was started on Lasix for lower extremity edema.  ED Course: In the ER patient's labs show sodium of 120 potassium 2.9 creatinine 1.7.  Hemoglobin 12.1.  Sodium was around 129 on 02/27/2023.  In the ER patient was given 500 cc normal saline bolus admitted for severe hyponatremia.  Patient is alert awake and oriented.  Review of Systems: As per HPI, rest all negative.   Past Medical History:  Diagnosis Date   CHF (congestive heart failure) (HCC)    Complication of anesthesia    Diabetes mellitus without complication (HCC)    Elevated coronary artery calcium score 02/02/2021   GERD (gastroesophageal reflux disease)    History of kidney stones 2014   Hypertension    Kidney stone 06/17/2015   PONV (postoperative nausea and vomiting)    Rupture of right quadriceps tendon 09/21/2020   - s/p srugical repair   Tuberculosis 1980   Type 2 diabetes mellitus without complication, without long-term current use of insulin (HCC) 03/21/2020    Past Surgical History:  Procedure Laterality Date   KIDNEY  SURGERY     KNEE SURGERY     LITHOTRIPSY     MICROLARYNGOSCOPY Bilateral 06/17/2022   Procedure: DIRECT LARYNGOSCOPY WITH BIOPSY;  Surgeon: Laren Boom, DO;  Location: [facility] OR;  Service: ENT;  Laterality: Bilateral;   QUADRICEPS TENDON REPAIR Right 09/25/2020   Procedure: REPAIR RIGHT QUADRICEP TENDON;  Surgeon: Nadara Mustard, MD;  Location: [facility] OR;  Service: Orthopedics;  Laterality: Right;   TONSILLECTOMY       reports that he has never smoked. He has never used smokeless tobacco. He reports that he does not drink alcohol and does not use drugs.  Allergies  Allergen Reactions   Norgesic Forte [Orphenadrine-Aspirin-Caffeine] Other (See Comments)    Unknown reaction    Latex Hives and Rash    Blisters    History reviewed. No pertinent family history.  Prior to Admission medications   Medication Sig Start Date End Date Taking? Authorizing Provider  aspirin EC 81 MG tablet Take 81 mg by mouth daily. Swallow whole.    [provider]  atorvastatin (LIPITOR) 20 MG tablet Take 20 mg by mouth daily.    [provider]  carvedilol (COREG) 25 MG tablet Take 25 mg by mouth 2 (two) times daily with a meal.    [provider]  cetirizine (ZYRTEC) 10 MG tablet Take 10 mg by mouth daily as needed for allergies. 24 hour    [provider]  chlorthalidone (HYGROTON) 25 MG tablet TAKE 1/2 TABLET BY MOUTH DAILY 08/15/22   Bryan Lemma  W, MD  furosemide (LASIX) 40 MG tablet Take 1 tablet (40 mg total) by mouth daily. 01/12/23 04/12/23  Joylene Grapes, NP  glimepiride (AMARYL) 4 MG tablet Take 2 mg by mouth daily with breakfast. Patient not taking: Reported on 03/27/2023    [provider]  glucose blood (FREESTYLE LITE) test strip  10/19/20   [provider]  HYDROcodone-acetaminophen (HYCET) 7.5-325 mg/15 ml solution Take 10 mL up to TID as needed for pain - not to exceed 30mL daily. 02/27/23   Lonie Peak, MD  meloxicam (MOBIC) 15 MG tablet Take  15 mg by mouth daily. 09/07/20   [provider]  metFORMIN (GLUCOPHAGE-XR) 500 MG 24 hr tablet Take 1,000 mg by mouth 2 (two) times daily. Patient not taking: Reported on 03/27/2023 09/01/20   [provider]  OVER THE COUNTER MEDICATION Take 2 capsules by mouth daily. xEO Mega Complex    [provider]  OVER THE COUNTER MEDICATION Take 2 capsules by mouth daily. Alpha crs plus supplement    [provider]  OVER THE COUNTER MEDICATION Take 2 capsules by mouth daily. Microplex vmz supplement    [provider]  pantoprazole (PROTONIX) 40 MG tablet Take 40 mg by mouth daily.    [provider]  potassium citrate (UROCIT-K) 10 MEQ (1080 MG) SR tablet Take 10 mEq by mouth 2 (two) times daily. 09/21/20   [provider]  sodium fluoride (FLUORISHIELD) 1.1 % GEL dental gel Place 1 Application onto teeth at bedtime. 07/07/22   [provider]  tadalafil (CIALIS) 20 MG tablet Take 20 mg by mouth daily as needed for erectile dysfunction. 07/27/20   [provider]  telmisartan (MICARDIS) 80 MG tablet Take 80 mg by mouth daily.    [provider]  triamcinolone cream (KENALOG) 0.1 % Apply 1 application topically 3 (three) times daily as needed for itching (rash). 09/10/20   [provider]  TRULICITY 1.5 MG/0.5ML SOPN Inject 1.5 mg as directed once a week. Thursday morning 10/20/20   [provider]    Physical Exam: Constitutional: Moderately built and nourished. Vitals:   04/05/23 1615 04/05/23 1900 04/05/23 1930 04/05/23 2037  BP: 123/64 (!) 166/74 (!) 160/87 (!) 149/71  Pulse: 62 70 78 72  Resp: 15 20 20 18   Temp:    98 F (36.7 C)  SpO2: 100% 100% 100% 100%  Weight:      Height:       Eyes: Anicteric no pallor. ENMT: No discharge from the ears eyes nose or mouth. Neck: No mass felt.  No neck rigidity. Respiratory: No rhonchi or crepitations. Cardiovascular: S1-S2 heard. Abdomen: Soft  nontender bowel sound present. Musculoskeletal: No edema. Skin: No rash. Neurologic: Alert awake oriented to time place and person.  Moves all extremities. Psychiatric: Appears normal.  Normal affect.   Labs on Admission: I have personally reviewed following labs and imaging studies  CBC: Recent Labs  Lab 04/05/23 1635  WBC 9.0  NEUTROABS 6.7  HGB 12.1*  HCT 33.3*  MCV 85.4  PLT 212   Basic Metabolic Panel: Recent Labs  Lab 04/05/23 1635 04/05/23 1902  NA 120*  --   K 2.9*  --   CL 83*  --   CO2 27  --   GLUCOSE 105*  --   BUN 46*  --   CREATININE 1.47*  --   CALCIUM 9.1  --   MG  --  1.9   GFR: Estimated Creatinine Clearance:  50.3 mL/min (A) (by C-G formula based on SCr of 1.47 mg/dL (H)). Liver Function Tests: Recent Labs  Lab 04/05/23 1635  AST 15  ALT 13  ALKPHOS 46  BILITOT 1.0  PROT 6.9  ALBUMIN 4.6   No results for input(s): "LIPASE", "AMYLASE" in the last 168 hours. No results for input(s): "AMMONIA" in the last 168 hours. Coagulation Profile: No results for input(s): "INR", "PROTIME" in the last 168 hours. Cardiac Enzymes: No results for input(s): "CKTOTAL", "CKMB", "CKMBINDEX", "TROPONINI" in the last 168 hours. BNP (last 3 results) No results for input(s): "PROBNP" in the last 8760 hours. HbA1C: No results for input(s): "HGBA1C" in the last 72 hours. CBG: No results for input(s): "GLUCAP" in the last 168 hours. Lipid Profile: No results for input(s): "CHOL", "HDL", "LDLCALC", "TRIG", "CHOLHDL", "LDLDIRECT" in the last 72 hours. Thyroid Function Tests: No results for input(s): "TSH", "T4TOTAL", "FREET4", "T3FREE", "THYROIDAB" in the last 72 hours. Anemia Panel: No results for input(s): "VITAMINB12", "FOLATE", "FERRITIN", "TIBC", "IRON", "RETICCTPCT" in the last 72 hours. Urine analysis:    Component Value Date/Time   COLORURINE YELLOW 04/05/2023 1655   APPEARANCEUR CLEAR 04/05/2023 1655   LABSPEC 1.007 04/05/2023 1655   PHURINE 5.5  04/05/2023 1655   GLUCOSEU NEGATIVE 04/05/2023 1655   HGBUR NEGATIVE 04/05/2023 1655   BILIRUBINUR NEGATIVE 04/05/2023 1655   KETONESUR NEGATIVE 04/05/2023 1655   PROTEINUR NEGATIVE 04/05/2023 1655   UROBILINOGEN 1.0 03/02/2010 2317   NITRITE NEGATIVE 04/05/2023 1655   LEUKOCYTESUR NEGATIVE 04/05/2023 1655   Sepsis Labs: @LABRCNTIP (procalcitonin:4,lacticidven:4) )No results found for this or any previous visit (from the past 240 hours).   Radiological Exams on Admission: No results found.  EKG: Independently reviewed.  Normal sinus rhythm.  IVCD.  Assessment/Plan Principal Problem:   Hyponatremia Active Problems:   Thoracic aortic atherosclerosis (HCC)   Resistant hypertension   Type 2 diabetes mellitus with complication, without long-term current use of insulin (HCC)   Pyriform sinus cancer (HCC)   ARF (acute renal failure) (HCC)    Severe hyponatremia -     patient's chlorthalidone dose was recently increased and also patient has been poor appetite likely both contributing to hyponatremia.  Did receive 500 cc normal saline in the ER.  Will continue with gentle hydration closely follow metabolic panel.  Check urine studies TSH and cortisol levels. Hypertension and history of diastolic CHF will continue with carvedilol and Micardis.  Hold Lasix and chlorthalidone for now given the hyponatremia and dehydration.  Follow blood pressure trends. Acute on chronic kidney disease stage III creatinine increased from baseline about 1.1 and is around 1.4.  Gentle hydration hold diuretics.  Follow metabolic panel. Diabetes mellitus type 2 has been off oral hypoglycemics due to hypoglycemic episodes.  Last hemoglobin A1c was 6.5  2 weeks ago.  On Trulicity.  Presently on sliding scale. Hyperlipidemia on statins. History of pyriform sinus cancer status post surgery and radiation being followed by oncologist. Chronic anemia follow CBC. Severe hypokalemia likely from diuretic use.  Replace  recheck.  Check magnesium levels.  Since patient has severe hyponatremia with acute renal failure will need close monitoring and more than 2 midnight stay.   DVT prophylaxis: Lovenox. Code Status: Full code. Family Communication: Discussed with patient. Disposition Plan: Medical floor. Consults called: None. Admission status: Observation.

## 2023-04-05 NOTE — ED Provider Notes (Signed)
 [place] EMERGENCY DEPARTMENT AT [facility] Provider Note  CSN: [id number] Arrival date & time: 04/05/23 1454  Chief Complaint(s) Abnormal Lab and Fatigue  HPI Randall [name] is a 68 y.o. male history of diabetes, CKD, laryngeal cancer status post surgery, hypertension, HFpEF presenting to the emergency department with abnormal lab.  Patient reports that he has been having generalized weakness and fatigue over the past week.  No tremor, seizures.  No urinary changes.  No fevers or chills.  No abdominal pain, chest pain, shortness of breath.  No lightheadedness or dizziness.  Does note that he had increased dose of his chlorthalidone recently.  Only other new medication is oxybutynin.  Had labs drawn today where his sodium was 121 and advised to come to the hospital.   Past Medical History Past Medical History:  Diagnosis Date   Complication of anesthesia    Diabetes mellitus without complication (HCC)    Elevated coronary artery calcium score 02/02/2021   GERD (gastroesophageal reflux disease)    History of kidney stones 2014   Hypertension    Kidney stone 06/17/2015   PONV (postoperative nausea and vomiting)    Rupture of right quadriceps tendon 09/21/2020   - s/p srugical repair   Tuberculosis 1980   Type 2 diabetes mellitus without complication, without long-term current use of insulin (HCC) 03/21/2020   Patient Active Problem List   Diagnosis Date Noted   Hyponatremia 04/05/2023   CKD (chronic kidney disease), symptom management only, stage 2 (mild) 03/29/2023   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   Hyperlipidemia LDL goal <70 02/21/2022   Elevated coronary artery calcium score-683 01/26/2021   Thoracic aortic atherosclerosis (HCC) 01/26/2021   Decreased left ventricular systolic function 12/25/2020   Resistant hypertension 11/13/2020   Right bundle branch block 11/13/2020   Hypertensive heart disease with chronic diastolic  congestive heart failure (HCC) 11/13/2020   Quadriceps tendon rupture, right, sequela    Type 2 diabetes mellitus with complication, without long-term current use of insulin (HCC) 03/21/2020   Home Medication(s) Prior to Admission medications   Medication Sig Start Date End Date Taking? Authorizing Provider  aspirin EC 81 MG tablet Take 81 mg by mouth daily. Swallow whole.    [provider]  atorvastatin (LIPITOR) 20 MG tablet Take 20 mg by mouth daily.    [provider]  carvedilol (COREG) 25 MG tablet Take 25 mg by mouth 2 (two) times daily with a meal.    [provider]  cetirizine (ZYRTEC) 10 MG tablet Take 10 mg by mouth daily as needed for allergies. 24 hour    [provider]  chlorthalidone (HYGROTON) 25 MG tablet TAKE 1/2 TABLET BY MOUTH DAILY 08/15/22   Marykay Lex, MD  furosemide (LASIX) 40 MG tablet Take 1 tablet (40 mg total) by mouth daily. 01/12/23 04/12/23  Joylene Grapes, NP  glimepiride (AMARYL) 4 MG tablet Take 2 mg by mouth daily with breakfast. Patient not taking: Reported on 03/27/2023    [provider]  glucose blood (FREESTYLE LITE) test strip  10/19/20   [provider]  HYDROcodone-acetaminophen (HYCET) 7.5-325 mg/15 ml solution Take 10 mL up to TID as needed for pain - not to exceed 30mL daily. 02/27/23   Lonie Peak, MD  meloxicam (MOBIC) 15 MG tablet Take 15 mg by mouth daily. 09/07/20   [provider]  metFORMIN (GLUCOPHAGE-XR) 500 MG 24 hr tablet Take 1,000 mg by mouth 2 (two) times  daily. Patient not taking: Reported on 03/27/2023 09/01/20   [provider]  OVER THE COUNTER MEDICATION Take 2 capsules by mouth daily. xEO Mega Complex    [provider]  OVER THE COUNTER MEDICATION Take 2 capsules by mouth daily. Alpha crs plus supplement    [provider]  OVER THE COUNTER MEDICATION Take 2 capsules by mouth daily. Microplex vmz supplement    [provider]   pantoprazole (PROTONIX) 40 MG tablet Take 40 mg by mouth daily.    [provider]  potassium citrate (UROCIT-K) 10 MEQ (1080 MG) SR tablet Take 10 mEq by mouth 2 (two) times daily. 09/21/20   [provider]  sodium fluoride (FLUORISHIELD) 1.1 % GEL dental gel Place 1 Application onto teeth at bedtime. 07/07/22   [provider]  tadalafil (CIALIS) 20 MG tablet Take 20 mg by mouth daily as needed for erectile dysfunction. 07/27/20   [provider]  telmisartan (MICARDIS) 80 MG tablet Take 80 mg by mouth daily.    [provider]  triamcinolone cream (KENALOG) 0.1 % Apply 1 application topically 3 (three) times daily as needed for itching (rash). 09/10/20   [provider]  TRULICITY 1.5 MG/0.5ML SOPN Inject 1.5 mg as directed once a week. Thursday morning 10/20/20   [provider]                                                                                                                                    Past Surgical History Past Surgical History:  Procedure Laterality Date   KIDNEY SURGERY     KNEE SURGERY     LITHOTRIPSY     MICROLARYNGOSCOPY Bilateral 06/17/2022   Procedure: DIRECT LARYNGOSCOPY WITH BIOPSY;  Surgeon: Laren Boom, DO;  Location: [facility] OR;  Service: ENT;  Laterality: Bilateral;   QUADRICEPS TENDON REPAIR Right 09/25/2020   Procedure: REPAIR RIGHT QUADRICEP TENDON;  Surgeon: Nadara Mustard, MD;  Location: [facility] OR;  Service: Orthopedics;  Laterality: Right;   TONSILLECTOMY     Family History History reviewed. No pertinent family history.  Social History Social History   Tobacco Use   Smoking status: Never   Smokeless tobacco: Never  Vaping Use   Vaping status: Never Used  Substance Use Topics   Alcohol use: Never   Drug use: Never   Allergies Norgesic forte [orphenadrine-aspirin-caffeine] and Latex  Review of Systems Review of Systems  All other systems reviewed and are  negative.   Physical Exam Vital Signs  I have reviewed the triage vital signs BP 123/64   Pulse 62   Temp 98.7 F (37.1 C)   Resp 15   Ht 5\' 10"  (1.778 m)   Wt 85.7 kg   SpO2 100%   BMI 27.11 kg/m  Physical Exam Vitals and nursing note reviewed.  Constitutional:      General: He is not in acute distress.  Appearance: Normal appearance.  HENT:     Mouth/Throat:     Mouth: Mucous membranes are moist.  Eyes:     Conjunctiva/sclera: Conjunctivae normal.  Cardiovascular:     Rate and Rhythm: Normal rate and regular rhythm.  Pulmonary:     Effort: Pulmonary effort is normal. No respiratory distress.     Breath sounds: Normal breath sounds.  Abdominal:     General: Abdomen is flat.     Palpations: Abdomen is soft.     Tenderness: There is no abdominal tenderness.  Musculoskeletal:     Comments: Trace lower extremity edema  Skin:    General: Skin is warm and dry.     Capillary Refill: Capillary refill takes less than 2 seconds.  Neurological:     Mental Status: He is alert and oriented to person, place, and time. Mental status is at baseline.  Psychiatric:        Mood and Affect: Mood normal.        Behavior: Behavior normal.     ED Results and Treatments Labs (all labs ordered are listed, but only abnormal results are displayed) Labs Reviewed  COMPREHENSIVE METABOLIC PANEL - Abnormal; Notable for the following components:      Result Value   Sodium 120 (*)    Potassium 2.9 (*)    Chloride 83 (*)    Glucose, Bld 105 (*)    BUN 46 (*)    Creatinine, Ser 1.47 (*)    GFR, Estimated 52 (*)    All other components within normal limits  CBC WITH DIFFERENTIAL/PLATELET - Abnormal; Notable for the following components:   RBC 3.90 (*)    Hemoglobin 12.1 (*)    HCT 33.3 (*)    MCHC 36.3 (*)    Monocytes Absolute 1.2 (*)    All other components within normal limits  URINALYSIS, ROUTINE W REFLEX MICROSCOPIC  OSMOLALITY  SODIUM, URINE, RANDOM  UREA NITROGEN, URINE   CREATININE, URINE, RANDOM  MAGNESIUM                                                                                                                          Radiology No results found.  Pertinent labs & imaging results that were available during my care of the patient were reviewed by me and considered in my medical decision making (see MDM for details).  Medications Ordered in ED Medications  potassium chloride SA (KLOR-CON M) CR tablet 40 mEq (has no administration in time range)  sodium chloride 0.9 % bolus 500 mL (500 mLs Intravenous New Bag/Given 04/05/23 1747)  Procedures .Critical Care  Performed by: Lonell Grandchild, MD Authorized by: Lonell Grandchild, MD   Critical care provider statement:    Critical care time (minutes):  30   Critical care was necessary to treat or prevent imminent or life-threatening deterioration of the following conditions:  Metabolic crisis   Critical care was time spent personally by me on the following activities:  Development of treatment plan with patient or surrogate, discussions with consultants, evaluation of patient's response to treatment, examination of patient, ordering and review of laboratory studies, ordering and review of radiographic studies, ordering and performing treatments and interventions, pulse oximetry, re-evaluation of patient's condition and review of old charts   Care discussed with: admitting provider and accepting provider at another facility     (including critical care time)  Medical Decision Making / ED Course   MDM:  68 year old presenting to the emergency department abnormal lab.  Review of outside labs with a sodium of 121.  Will repeat to confirm.  Patient does report increased fatigue and weakness which could be associated with hyponatremia.  Denies any tremor, seizures to  suggest any CNS effect.  Suspect most likely cause is due to recent medication changes, increased chlorthalidone dose could certainly cause hyponatremia.  Patient appears relatively euvolemic without significant volume overload and does not appear severely dehydrated however his creatinine was slightly up on laboratory testing today.  Patient otherwise denies any focal symptoms to suggest any alternative process.  Will reassess.  His sodium is low, will need admission.  Clinical Course as of 04/05/23 1808  Wed Apr 05, 2023  1802 Discussed with Dr. Frederick Peers who will admit for hyponatremia. Gave 500 cc NS bolus  [WS]    Clinical Course User Index [WS] Lonell Grandchild, MD     Additional history obtained: -Additional history obtained from spouse -External records from outside source obtained and reviewed including: Chart review including previous notes, labs, imaging, consultation notes including cardiology notes    Lab Tests: -I ordered, reviewed, and interpreted labs.   The pertinent results include:   Labs Reviewed  COMPREHENSIVE METABOLIC PANEL - Abnormal; Notable for the following components:      Result Value   Sodium 120 (*)    Potassium 2.9 (*)    Chloride 83 (*)    Glucose, Bld 105 (*)    BUN 46 (*)    Creatinine, Ser 1.47 (*)    GFR, Estimated 52 (*)    All other components within normal limits  CBC WITH DIFFERENTIAL/PLATELET - Abnormal; Notable for the following components:   RBC 3.90 (*)    Hemoglobin 12.1 (*)    HCT 33.3 (*)    MCHC 36.3 (*)    Monocytes Absolute 1.2 (*)    All other components within normal limits  URINALYSIS, ROUTINE W REFLEX MICROSCOPIC  OSMOLALITY  SODIUM, URINE, RANDOM  UREA NITROGEN, URINE  CREATININE, URINE, RANDOM  MAGNESIUM    Notable for hyponatremia, hypokalemia, AKI   EKG   EKG Interpretation Date/Time:  Wednesday April 05 2023 15:06:16 EDT Ventricular Rate:  60 PR Interval:  200 QRS Duration:  204 QT Interval:  494 QTC  Calculation: 494 R Axis:   -61  Text Interpretation: Normal sinus rhythm Right bundle branch block Left anterior fascicular block Bifascicular block Minimal voltage criteria for LVH, may be normal variant ( R in aVL ) Septal infarct (cited on or before 05-Apr-2023) Abnormal ECG When compared with ECG of 12-Jan-2023 11:37, PR interval has decreased QRS  duration has increased Confirmed by Alvino Blood ([id number]) on 04/05/2023 3:12:50 PM          Medicines ordered and prescription drug management: Meds ordered this encounter  Medications   sodium chloride 0.9 % bolus 500 mL   potassium chloride SA (KLOR-CON M) CR tablet 40 mEq    -I have reviewed the patients home medicines and have made adjustments as needed   Consultations Obtained: I requested consultation with the hospitalist,  and discussed lab and imaging findings as well as pertinent plan - they recommend: admission   Cardiac Monitoring: The patient was maintained on a cardiac monitor.  I personally viewed and interpreted the cardiac monitored which showed an underlying rhythm of: NSR  Reevaluation: After the interventions noted above, I reevaluated the patient and found that their symptoms have improved  Co morbidities that complicate the patient evaluation  Past Medical History:  Diagnosis Date   Complication of anesthesia    Diabetes mellitus without complication (HCC)    Elevated coronary artery calcium score 02/02/2021   GERD (gastroesophageal reflux disease)    History of kidney stones 2014   Hypertension    Kidney stone 06/17/2015   PONV (postoperative nausea and vomiting)    Rupture of right quadriceps tendon 09/21/2020   - s/p srugical repair   Tuberculosis 1980   Type 2 diabetes mellitus without complication, without long-term current use of insulin (HCC) 03/21/2020      Dispostion: Disposition decision including need for hospitalization was considered, and patient admitted to the  hospital.    Final Clinical Impression(s) / ED Diagnoses Final diagnoses:  Hyponatremia     This chart was dictated using voice recognition software.  Despite best efforts to proofread,  errors can occur which can change the documentation meaning.    Lonell Grandchild, MD 04/05/23 [phone]

## 2023-04-05 NOTE — ED Triage Notes (Signed)
 Patient arrives with complaints of abnormal labs. Patient reports a low sodium and elevated kidney function levels.  Sent here by his PCP for further evaluation.

## 2023-04-06 DIAGNOSIS — E871 Hypo-osmolality and hyponatremia: Secondary | ICD-10-CM | POA: Diagnosis not present

## 2023-04-06 DIAGNOSIS — E876 Hypokalemia: Secondary | ICD-10-CM | POA: Diagnosis not present

## 2023-04-06 DIAGNOSIS — D649 Anemia, unspecified: Secondary | ICD-10-CM | POA: Insufficient documentation

## 2023-04-06 DIAGNOSIS — N179 Acute kidney failure, unspecified: Secondary | ICD-10-CM | POA: Diagnosis not present

## 2023-04-06 LAB — BASIC METABOLIC PANEL WITH GFR
Anion gap: 10 (ref 5–15)
Anion gap: 10 (ref 5–15)
Anion gap: 11 (ref 5–15)
Anion gap: 12 (ref 5–15)
BUN: 31 mg/dL — ABNORMAL HIGH (ref 8–23)
BUN: 33 mg/dL — ABNORMAL HIGH (ref 8–23)
BUN: 33 mg/dL — ABNORMAL HIGH (ref 8–23)
BUN: 36 mg/dL — ABNORMAL HIGH (ref 8–23)
CO2: 24 mmol/L (ref 22–32)
CO2: 24 mmol/L (ref 22–32)
CO2: 24 mmol/L (ref 22–32)
CO2: 24 mmol/L (ref 22–32)
Calcium: 8.8 mg/dL — ABNORMAL LOW (ref 8.9–10.3)
Calcium: 9 mg/dL (ref 8.9–10.3)
Calcium: 9 mg/dL (ref 8.9–10.3)
Calcium: 9 mg/dL (ref 8.9–10.3)
Chloride: 91 mmol/L — ABNORMAL LOW (ref 98–111)
Chloride: 93 mmol/L — ABNORMAL LOW (ref 98–111)
Chloride: 93 mmol/L — ABNORMAL LOW (ref 98–111)
Chloride: 94 mmol/L — ABNORMAL LOW (ref 98–111)
Creatinine, Ser: 0.89 mg/dL (ref 0.61–1.24)
Creatinine, Ser: 1 mg/dL (ref 0.61–1.24)
Creatinine, Ser: 1.04 mg/dL (ref 0.61–1.24)
Creatinine, Ser: 1.08 mg/dL (ref 0.61–1.24)
GFR, Estimated: >60 mL/min (ref >60)
GFR, Estimated: >60 mL/min (ref >60)
GFR, Estimated: >60 mL/min (ref >60)
GFR, Estimated: >60 mL/min (ref >60)
Glucose, Bld: 120 mg/dL — ABNORMAL HIGH (ref 70–99)
Glucose, Bld: 156 mg/dL — ABNORMAL HIGH (ref 70–99)
Glucose, Bld: 165 mg/dL — ABNORMAL HIGH (ref 70–99)
Glucose, Bld: 170 mg/dL — ABNORMAL HIGH (ref 70–99)
Potassium: 2.9 mmol/L — ABNORMAL LOW (ref 3.5–5.1)
Potassium: 3.1 mmol/L — ABNORMAL LOW (ref 3.5–5.1)
Potassium: 3.5 mmol/L (ref 3.5–5.1)
Potassium: 3.7 mmol/L (ref 3.5–5.1)
Sodium: 127 mmol/L — ABNORMAL LOW (ref 135–145)
Sodium: 127 mmol/L — ABNORMAL LOW (ref 135–145)
Sodium: 127 mmol/L — ABNORMAL LOW (ref 135–145)
Sodium: 129 mmol/L — ABNORMAL LOW (ref 135–145)

## 2023-04-06 LAB — CORTISOL: Cortisol, Plasma: 10.2 ug/dL

## 2023-04-06 LAB — CBC
HCT: 33 % — ABNORMAL LOW (ref 39.0–52.0)
HCT: 34.9 % — ABNORMAL LOW (ref 39.0–52.0)
Hemoglobin: 11.9 g/dL — ABNORMAL LOW (ref 13.0–17.0)
Hemoglobin: 12.4 g/dL — ABNORMAL LOW (ref 13.0–17.0)
MCH: 31.3 pg (ref 26.0–34.0)
MCH: 31.4 pg (ref 26.0–34.0)
MCHC: 35.5 g/dL (ref 30.0–36.0)
MCHC: 36.1 g/dL — ABNORMAL HIGH (ref 30.0–36.0)
MCV: 86.8 fL (ref 80.0–100.0)
MCV: 88.4 fL (ref 80.0–100.0)
Platelets: 180 10*3/uL (ref 150–400)
Platelets: 207 10*3/uL (ref 150–400)
RBC: 3.8 MIL/uL — ABNORMAL LOW (ref 4.22–5.81)
RBC: 3.95 MIL/uL — ABNORMAL LOW (ref 4.22–5.81)
RDW: 12.9 % (ref 11.5–15.5)
RDW: 13 % (ref 11.5–15.5)
WBC: 8.1 10*3/uL (ref 4.0–10.5)
WBC: 9.4 10*3/uL (ref 4.0–10.5)
nRBC: 0 % (ref 0.0–0.2)
nRBC: 0 % (ref 0.0–0.2)

## 2023-04-06 LAB — MAGNESIUM: Magnesium: 2.2 mg/dL (ref 1.7–2.4)

## 2023-04-06 LAB — GLUCOSE, CAPILLARY
Glucose-Capillary: 137 mg/dL — ABNORMAL HIGH (ref 70–99)
Glucose-Capillary: 150 mg/dL — ABNORMAL HIGH (ref 70–99)

## 2023-04-06 LAB — URIC ACID: Uric Acid, Serum: 7.5 mg/dL (ref 3.7–8.6)

## 2023-04-06 LAB — HIV ANTIBODY (ROUTINE TESTING W REFLEX): HIV Screen 4th Generation wRfx: NONREACTIVE

## 2023-04-06 LAB — TSH: TSH: 0.504 u[IU]/mL (ref 0.350–4.500)

## 2023-04-06 LAB — OSMOLALITY, URINE: Osmolality, Ur: 175 mosm/kg — ABNORMAL LOW (ref 300–900)

## 2023-04-06 MED ORDER — ONDANSETRON HCL 4 MG/2ML IJ SOLN
4.0000 mg | Freq: Once | INTRAMUSCULAR | Status: AC
  Administered 2023-04-06: 4 mg via INTRAVENOUS
  Filled 2023-04-06: qty 2

## 2023-04-06 MED ORDER — CHLORTHALIDONE 25 MG PO TABS: 25.0000 mg | ORAL_TABLET | Freq: Every day | ORAL | Status: DC

## 2023-04-06 MED ORDER — LACTATED RINGERS IV BOLUS
1000.0000 mL | Freq: Once | INTRAVENOUS | Status: AC
  Administered 2023-04-06: 1000 mL via INTRAVENOUS

## 2023-04-06 MED ORDER — HYDROCODONE-ACETAMINOPHEN 7.5-325 MG/15ML PO SOLN
10.0000 mL | ORAL | Status: DC | PRN
  Administered 2023-04-06: 10 mL via ORAL
  Filled 2023-04-06 (×3): qty 15

## 2023-04-06 MED ORDER — HYDROCODONE-ACETAMINOPHEN 7.5-325 MG/15ML PO SOLN
10.0000 mL | Freq: Three times a day (TID) | ORAL | Status: DC | PRN
  Administered 2023-04-06: 10 mL via ORAL

## 2023-04-06 MED ORDER — POTASSIUM CHLORIDE 20 MEQ PO PACK
40.0000 meq | PACK | Freq: Once | ORAL | Status: AC
  Administered 2023-04-06: 40 meq via ORAL
  Filled 2023-04-06: qty 2

## 2023-04-06 NOTE — Plan of Care (Signed)
 LR bag finished.  Pt and pt family received, educated on, and understands discharge instructions.  Pt satisfied with all belongings

## 2023-04-06 NOTE — TOC Initial Note (Signed)
 Transition of Care [facility]) - Initial/Assessment Note    Patient Details  Name: Randall [name] MRN: [id number] Date of Birth: [birth year]  Transition of Care [facility]) CM/SW Contact:    Adrian Prows, RN Phone Number: 04/06/2023, 10:26 AM  Clinical Narrative:                 Spoke w/ pt and wife Aldwin Micalizzi [phone]) in room; pt says he lives at home w/ his wife; he plans to return at d/c; his wife will provide transportation; pt verified insurance/PCP; he denies SDOH risks; he has canes, crutches, and walker; pt says he does not have HH services or home oxygen; no TOC needs; TOC is signing off; please place consult if needed.  Expected Discharge Plan: Home/Self Care Barriers to Discharge: Continued Medical Work up   Patient Goals and CMS Choice Patient states their goals for this hospitalization and ongoing recovery are:: home CMS Medicare.gov Compare Post Acute Care list provided to:: Patient Choice offered to / list presented to : Patient [place] ownership interest in [facility].provided to:: Patient    Expected Discharge Plan and Services   Discharge Planning Services: CM Consult   Living arrangements for the past 2 months: Single Family Home                 DME Arranged: N/A DME Agency: NA       HH Arranged: NA HH Agency: NA        Prior Living Arrangements/Services Living arrangements for the past 2 months: Single Family Home Lives with:: Spouse Patient language and need for interpreter reviewed:: Yes Do you feel safe going back to the place where you live?: Yes      Need for Family Participation in Patient Care: Yes (Comment) Care giver support system in place?: Yes (comment) Current home services: DME (cane, crutches, walker) Criminal Activity/Legal Involvement Pertinent to Current Situation/Hospitalization: No - Comment as needed  Activities of Daily Living   ADL Screening (condition at time of admission) Independently performs  ADLs?: Yes (appropriate for developmental age) Is the patient deaf or have difficulty hearing?: Yes Does the patient have difficulty seeing, even when wearing glasses/contacts?: No Does the patient have difficulty concentrating, remembering, or making decisions?: No  Permission Sought/Granted Permission sought to share information with : Case Manager Permission granted to share information with : Yes, Verbal Permission Granted  Share Information with NAME: Case Manager     Permission granted to share info w Relationship: Jahbari Repinski (spouse) [phone]     Emotional Assessment Appearance:: Appears stated age Attitude/Demeanor/Rapport: Gracious Affect (typically observed): Accepting Orientation: : Oriented to Self, Oriented to Place, Oriented to  Time, Oriented to Situation Alcohol / Substance Use: Not Applicable Psych Involvement: No (comment)  Admission diagnosis:  Hyponatremia [E87.1] Patient Active Problem List   Diagnosis Date Noted   Hypokalemia 04/06/2023   Anemia 04/06/2023   Hyponatremia 04/05/2023   ARF (acute renal failure) (HCC) 04/05/2023   CKD (chronic kidney disease), symptom management only, stage 2 (mild) 03/29/2023   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   Hyperlipidemia LDL goal <70 02/21/2022   Elevated coronary artery calcium score-683 01/26/2021   Thoracic aortic atherosclerosis (HCC) 01/26/2021   Decreased left ventricular systolic function 12/25/2020   Resistant hypertension 11/13/2020   Right bundle branch block 11/13/2020   Hypertensive heart disease with chronic diastolic congestive heart failure (HCC) 11/13/2020   Quadriceps tendon rupture, right, sequela    Type  2 diabetes mellitus with complication, without long-term current use of insulin (HCC) 03/21/2020   PCP:  Odis Luster, PA-C Pharmacy:   [id number] [place] [place] - [name]  [place] [address] Phone: [phone] Fax: [phone]  [facility] [address] [place] [address] Phone: [phone] Fax: [phone]  [id number] - [address] [place] [address] Phone: [phone] Fax: [phone]     Social Drivers of Health (SDOH) Social History: SDOH Screenings   Food Insecurity: No Food Insecurity (04/06/2023)  Housing: Low Risk  (04/06/2023)  Transportation Needs: No Transportation Needs (04/06/2023)  Utilities: Not At Risk (04/06/2023)  Depression (PHQ2-9): Low Risk  (07/01/2022)  Social Connections: Unknown (04/06/2023)  Tobacco Use: Low Risk  (04/05/2023)  Health Literacy: Adequate Health Literacy (09/21/2022)   SDOH Interventions: Food Insecurity Interventions: Intervention Not Indicated, Inpatient TOC Housing Interventions: Intervention Not Indicated, Inpatient TOC Transportation Interventions: Intervention Not Indicated, Inpatient TOC Utilities Interventions: Intervention Not Indicated, Inpatient TOC   Readmission Risk Interventions     No data to display

## 2023-04-06 NOTE — Plan of Care (Signed)
 Pt with discharge orders.  MD confirmed okay to discharge once LR bolus is completed

## 2023-04-06 NOTE — Hospital Course (Signed)
 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  Recently seen by cardiology on 02/20/2023.  Amlodipine discontinued due to hypotension in December 2024 and chlorthalidone had been increased to 25 mg daily.  Remains on Coreg and telmisartan. He was also recently started on Lasix and was taking both Lasix and chlorthalidone.  He has also been on Hycet for pain control and had recently been discontinued from using.  Soon after running out, he developed vomiting and diarrhea while still taking home medications.   On workup on admission, initial sodium was found to be 120.  Potassium 2.9, creatinine 1.47 with normal renal function at baseline.  He was admitted for IV fluids and potassium replacement. Sodium improved to 129 after treatment upon admission. Prior to discharge, level did drop to 127, and he was given 1 L LR bolus. Lasix was discontinued at discharge and chlorthalidone was held for 2 more days.  He appears to have had some mild opioid withdrawal causing his GI symptoms and was recommended to taper off in the future but for now he has received refills outpatient. He tolerated diet well prior to discharge and is able to maintain adequate nutrition.

## 2023-04-06 NOTE — Discharge Summary (Signed)
 Physician Discharge Summary   Randall [name] FAO:[id number] DOB: 1955-09-10 DOA: 04/05/2023  PCP: Odis Luster, PA-C  Admit date: 04/05/2023 Discharge date: 04/06/2023   Admitted From: Home Disposition: Home Discharging physician: Lewie Chamber, MD Barriers to discharge: none  Recommendations at discharge: Repeat BMP at follow-up   Discharge Condition: stable CODE STATUS: Full Diet recommendation:  Diet Orders (From admission, onward)     Start     Ordered   04/06/23 0800  Diet regular Fluid consistency: Thin  Diet effective now       Question:  Fluid consistency:  Answer:  Thin   04/06/23 0759   04/06/23 0000  Diet general        04/06/23 1126            Hospital Course: 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  Recently seen by cardiology on 02/20/2023.  Amlodipine discontinued due to hypotension in December 2024 and chlorthalidone had been increased to 25 mg daily.  Remains on Coreg and telmisartan. He was also recently started on Lasix and was taking both Lasix and chlorthalidone.  He has also been on Hycet for pain control and had recently been discontinued from using.  Soon after running out, he developed vomiting and diarrhea while still taking home medications.   On workup on admission, initial sodium was found to be 120.  Potassium 2.9, creatinine 1.47 with normal renal function at baseline.  He was admitted for IV fluids and potassium replacement. Sodium improved to 129 after treatment upon admission. Prior to discharge, level did drop to 127, and he was given 1 L LR bolus. Lasix was discontinued at discharge and chlorthalidone was held for 2 more days.  He appears to have had some mild opioid withdrawal causing his GI symptoms and was recommended to taper off in the future but for now he has  received refills outpatient. He tolerated diet well prior to discharge and is able to maintain adequate nutrition.   The patient's acute and chronic medical conditions were treated accordingly. On day of discharge, patient was felt deemed stable for discharge. Patient/family member advised to call PCP or come back to ER if needed.   Principal Diagnosis: Hyponatremia  Discharge Diagnoses: Active Hospital Problems   Diagnosis Date Noted   Hyponatremia 04/05/2023    Priority: 1.   ARF (acute renal failure) (HCC) 04/05/2023    Priority: 2.   Hypokalemia 04/06/2023    Priority: 3.   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     Discharge Instructions     Diet general   Complete by: As directed    Increase activity slowly   Complete by: As directed       Allergies as of 04/06/2023       Reactions   Norgesic Forte [orphenadrine-aspirin-caffeine] Other (See Comments)   Unknown reaction    Latex Hives, Rash   Blisters        Medication List  STOP taking these medications    furosemide 40 MG tablet Commonly known as: LASIX   methylPREDNISolone 4 MG Tbpk tablet Commonly known as: MEDROL DOSEPAK   oxybutynin 5 MG tablet Commonly known as: DITROPAN       TAKE these medications    aspirin EC 81 MG tablet Take 81 mg by mouth daily. Swallow whole.   atorvastatin 20 MG tablet Commonly known as: LIPITOR Take 20 mg by mouth daily.   carvedilol 25 MG tablet Commonly known as: COREG Take 25 mg by mouth 2 (two) times daily with a meal.   cetirizine 10 MG  tablet Commonly known as: ZYRTEC Take 10 mg by mouth every evening.   chlorhexidine 0.12 % solution Commonly known as: PERIDEX Use as directed 10 mLs in the mouth or throat at bedtime.   chlorthalidone 25 MG tablet Commonly known as: HYGROTON Take 1 tablet (25 mg total) by mouth daily. Start taking on: April 08, 2023 What changed:  how much to take These instructions start on April 08, 2023. If you are unsure what to do until then, ask your doctor or other care provider.   HYDROcodone-acetaminophen 7.5-325 mg/15 ml solution Commonly known as: HYCET Take 10 mL up to TID as needed for pain - not to exceed 30mL daily. What changed:  how much to take how to take this when to take this reasons to take this additional instructions   LORazepam 0.5 MG tablet Commonly known as: ATIVAN Take 0.5 mg by mouth every 8 (eight) hours as needed for anxiety.   meloxicam 15 MG tablet Commonly known as: MOBIC Take 15 mg by mouth daily.   MULTIVITAMIN PO Take 1 tablet by mouth daily.   ondansetron 8 MG tablet Commonly known as: ZOFRAN Take 8 mg by mouth every 8 (eight) hours as needed for nausea or vomiting.   OVER THE COUNTER MEDICATION Take 30 mLs by mouth every 6 (six) hours as needed (Pain). PainQuil   pantoprazole 40 MG tablet Commonly known as: PROTONIX Take 40 mg by mouth daily.   potassium chloride 10 MEQ CR capsule Commonly known as: MICRO-K Take 10 mEq by mouth 2 (two) times daily.   sodium fluoride 1.1 % Gel dental gel Commonly known as: FLUORISHIELD Place 1 Application onto teeth at bedtime.   tadalafil 20 MG tablet Commonly known as: CIALIS Take 20 mg by mouth daily as needed for erectile dysfunction.   telmisartan 80 MG tablet Commonly known as: MICARDIS Take 80 mg by mouth daily.   triamcinolone cream 0.1 % Commonly known as: KENALOG Apply 1 application topically 3 (three) times daily as needed for itching (rash).   Trulicity 0.75 MG/0.5ML Soaj Generic  drug: Dulaglutide Inject 0.75 mg into the skin once a week.        Allergies  Allergen Reactions   Norgesic Forte [Orphenadrine-Aspirin-Caffeine] Other (See Comments)    Unknown reaction    Latex Hives and Rash    Blisters    Consultations:   Procedures:   Discharge Exam: BP 107/61 (BP Location: Right Arm)   Pulse 77   Temp 98.9 F (37.2 C)   Resp 18   Ht 5\' 10"  (1.778 m)   Wt 85.7 kg   SpO2 99%   BMI 27.11 kg/m  Physical Exam Constitutional:      General: He is not in acute distress.    Appearance: Normal appearance.  HENT:     Head: Normocephalic and atraumatic.     Mouth/Throat:  Mouth: Mucous membranes are moist.  Eyes:     Extraocular Movements: Extraocular movements intact.  Neck:     Comments: Lymphedema appreciated anterior neck, slightly worse on the left Cardiovascular:     Rate and Rhythm: Normal rate and regular rhythm.  Pulmonary:     Effort: Pulmonary effort is normal. No respiratory distress.     Breath sounds: Normal breath sounds. No wheezing.  Abdominal:     General: Bowel sounds are normal. There is no distension.     Palpations: Abdomen is soft.     Tenderness: There is no abdominal tenderness.  Musculoskeletal:        General: Normal range of motion.     Cervical back: Normal range of motion and neck supple.     Right lower leg: No edema.     Left lower leg: No edema.  Skin:    General: Skin is warm and dry.  Neurological:     General: No focal deficit present.     Mental Status: He is alert.  Psychiatric:        Mood and Affect: Mood normal.        Behavior: Behavior normal.      The results of significant diagnostics from this hospitalization (including imaging, microbiology, ancillary and laboratory) are listed below for reference.   Microbiology: No results found for this or any previous visit (from the past 240 hours).   Labs: BNP (last 3 results) No results for input(s): "BNP" in the last 8760 hours. Basic  Metabolic Panel: Recent Labs  Lab 04/05/23 1635 04/05/23 1902 04/05/23 2330 04/06/23 0301 04/06/23 0603 04/06/23 1043  NA 120*  --  127* 127* 129* 127*  K 2.9*  --  2.9* 3.1* 3.7 3.5  CL 83*  --  91* 93* 94* 93*  CO2 27  --  24 24 24 24   GLUCOSE 105*  --  120* 165* 156* 170*  BUN 46*  --  36* 33* 31* 33*  CREATININE 1.47*  --  1.04 1.08 1.00 0.89  CALCIUM 9.1  --  9.0 8.8* 9.0 9.0  MG  --  1.9 2.2  --   --   --    Liver Function Tests: Recent Labs  Lab 04/05/23 1635  AST 15  ALT 13  ALKPHOS 46  BILITOT 1.0  PROT 6.9  ALBUMIN 4.6   No results for input(s): "LIPASE", "AMYLASE" in the last 168 hours. No results for input(s): "AMMONIA" in the last 168 hours. CBC: Recent Labs  Lab 04/05/23 1635 04/05/23 2330 04/06/23 0301  WBC 9.0 9.4 8.1  NEUTROABS 6.7  --   --   HGB 12.1* 12.4* 11.9*  HCT 33.3* 34.9* 33.0*  MCV 85.4 88.4 86.8  PLT 212 207 180   Cardiac Enzymes: No results for input(s): "CKTOTAL", "CKMB", "CKMBINDEX", "TROPONINI" in the last 168 hours. BNP: Invalid input(s): "POCBNP" CBG: Recent Labs  Lab 04/05/23 2310 04/06/23 0804 04/06/23 1212  GLUCAP 114* 137* 150*   D-Dimer No results for input(s): "DDIMER" in the last 72 hours. Hgb A1c No results for input(s): "HGBA1C" in the last 72 hours. Lipid Profile No results for input(s): "CHOL", "HDL", "LDLCALC", "TRIG", "CHOLHDL", "LDLDIRECT" in the last 72 hours. Thyroid function studies Recent Labs    04/05/23 2330  TSH 0.504   Anemia work up No results for input(s): "VITAMINB12", "FOLATE", "FERRITIN", "TIBC", "IRON", "RETICCTPCT" in the last 72 hours. Urinalysis    Component Value Date/Time   COLORURINE YELLOW 04/05/2023 1655  APPEARANCEUR CLEAR 04/05/2023 1655   LABSPEC 1.007 04/05/2023 1655   PHURINE 5.5 04/05/2023 1655   GLUCOSEU NEGATIVE 04/05/2023 1655   HGBUR NEGATIVE 04/05/2023 1655   BILIRUBINUR NEGATIVE 04/05/2023 1655   KETONESUR NEGATIVE 04/05/2023 1655   PROTEINUR NEGATIVE  04/05/2023 1655   UROBILINOGEN 1.0 03/02/2010 2317   NITRITE NEGATIVE 04/05/2023 1655   LEUKOCYTESUR NEGATIVE 04/05/2023 1655   Sepsis Labs Recent Labs  Lab 04/05/23 1635 04/05/23 2330 04/06/23 0301  WBC 9.0 9.4 8.1   Microbiology No results found for this or any previous visit (from the past 240 hours).  Procedures/Studies: No results found.   Time coordinating discharge: Over 30 minutes    Lewie Chamber, MD  [facility] 04/06/2023, 2:31 PM

## 2023-04-06 NOTE — Care Management Obs Status (Signed)
 MEDICARE OBSERVATION STATUS NOTIFICATION   Patient Details  Name: Randall [name] MRN: [id number] Date of Birth: 1955-03-26   Medicare Observation Status Notification Given:  Yes    Adrian Prows, RN 04/06/2023, 10:22 AM

## 2023-04-25 ENCOUNTER — Encounter: Payer: Self-pay | Admitting: Cardiology

## 2023-04-25 NOTE — Telephone Encounter (Signed)
 Randall Bobo NP increase Chlorthalidone 25 mg daily  at last office visit  on 02/20/23   Here is a copy from the office note Resistant hypertension BP today 140/62 and repeat 138/64.  Home BPs average upper 130s to 140s.  He is above his goal of less than 130/80.  He does have grade 2 DD noted on prior echocardiogram. -Amlodipine discontinued 12/2022 due to hypotension  -Plan to increase Chlorthalidone to 25 mg daily.  Continue Carvedilol 25 mg twice daily and Telmisartan 80 mg daily -Continue to monitor BP at home  -BMET ordered to be completed in 1 week Ava Boatman, NP

## 2023-05-03 MED ORDER — CHLORTHALIDONE 25 MG PO TABS: 25.0000 mg | ORAL_TABLET | Freq: Every day | ORAL | 3 refills | Status: DC

## 2023-05-16 ENCOUNTER — Encounter (HOSPITAL_BASED_OUTPATIENT_CLINIC_OR_DEPARTMENT_OTHER): Payer: Self-pay | Admitting: Emergency Medicine

## 2023-05-16 ENCOUNTER — Emergency Department (HOSPITAL_BASED_OUTPATIENT_CLINIC_OR_DEPARTMENT_OTHER)

## 2023-05-16 ENCOUNTER — Emergency Department (HOSPITAL_BASED_OUTPATIENT_CLINIC_OR_DEPARTMENT_OTHER)
Admission: EM | Admit: 2023-05-16 | Discharge: 2023-05-16 | Disposition: A | Discharge status: Home / Self Care | Attending: Emergency Medicine | Admitting: Emergency Medicine

## 2023-05-16 ENCOUNTER — Ambulatory Visit

## 2023-05-16 DIAGNOSIS — Z7982 Long term (current) use of aspirin: Secondary | ICD-10-CM | POA: Insufficient documentation

## 2023-05-16 DIAGNOSIS — D649 Anemia, unspecified: Secondary | ICD-10-CM | POA: Diagnosis not present

## 2023-05-16 DIAGNOSIS — C4440 Unspecified malignant neoplasm of skin of scalp and neck: Secondary | ICD-10-CM | POA: Insufficient documentation

## 2023-05-16 DIAGNOSIS — R1031 Right lower quadrant pain: Secondary | ICD-10-CM | POA: Diagnosis present

## 2023-05-16 DIAGNOSIS — E871 Hypo-osmolality and hyponatremia: Secondary | ICD-10-CM | POA: Diagnosis not present

## 2023-05-16 DIAGNOSIS — Z91040 Latex allergy status: Secondary | ICD-10-CM | POA: Diagnosis not present

## 2023-05-16 LAB — URINALYSIS, ROUTINE W REFLEX MICROSCOPIC
Bilirubin Urine: NEGATIVE
Glucose, UA: NEGATIVE mg/dL
Hgb urine dipstick: NEGATIVE
Ketones, ur: NEGATIVE mg/dL
Leukocytes,Ua: NEGATIVE
Nitrite: NEGATIVE
Protein, ur: NEGATIVE mg/dL
Specific Gravity, Urine: 1.007 (ref 1.005–1.030)
pH: 6 (ref 5.0–8.0)

## 2023-05-16 LAB — COMPREHENSIVE METABOLIC PANEL WITH GFR
ALT: 13 U/L (ref 0–44)
AST: 18 U/L (ref 15–41)
Albumin: 4.2 g/dL (ref 3.5–5.0)
Alkaline Phosphatase: 51 U/L (ref 38–126)
Anion gap: 11 (ref 5–15)
BUN: 13 mg/dL (ref 8–23)
CO2: 26 mmol/L (ref 22–32)
Calcium: 9.5 mg/dL (ref 8.9–10.3)
Chloride: 94 mmol/L — ABNORMAL LOW (ref 98–111)
Creatinine, Ser: 0.97 mg/dL (ref 0.61–1.24)
GFR, Estimated: >60 mL/min (ref >60)
Glucose, Bld: 92 mg/dL (ref 70–99)
Potassium: 4.1 mmol/L (ref 3.5–5.1)
Sodium: 131 mmol/L — ABNORMAL LOW (ref 135–145)
Total Bilirubin: 0.6 mg/dL (ref 0.0–1.2)
Total Protein: 6.4 g/dL — ABNORMAL LOW (ref 6.5–8.1)

## 2023-05-16 LAB — CBC
HCT: 31.7 % — ABNORMAL LOW (ref 39.0–52.0)
Hemoglobin: 10.8 g/dL — ABNORMAL LOW (ref 13.0–17.0)
MCH: 31.4 pg (ref 26.0–34.0)
MCHC: 34.1 g/dL (ref 30.0–36.0)
MCV: 92.2 fL (ref 80.0–100.0)
Platelets: 186 10*3/uL (ref 150–400)
RBC: 3.44 MIL/uL — ABNORMAL LOW (ref 4.22–5.81)
RDW: 14 % (ref 11.5–15.5)
WBC: 7.7 10*3/uL (ref 4.0–10.5)
nRBC: 0 % (ref 0.0–0.2)

## 2023-05-16 LAB — LIPASE, BLOOD: Lipase: 33 U/L (ref 11–51)

## 2023-05-16 MED ORDER — HYDROMORPHONE HCL 1 MG/ML IJ SOLN
0.5000 mg | Freq: Once | INTRAMUSCULAR | Status: AC
  Administered 2023-05-16: 0.5 mg via INTRAVENOUS
  Filled 2023-05-16: qty 1

## 2023-05-16 MED ORDER — ONDANSETRON HCL 4 MG/2ML IJ SOLN
4.0000 mg | Freq: Once | INTRAMUSCULAR | Status: AC
  Administered 2023-05-16: 4 mg via INTRAVENOUS
  Filled 2023-05-16: qty 2

## 2023-05-16 MED ORDER — IOHEXOL 300 MG/ML  SOLN
100.0000 mL | Freq: Once | INTRAMUSCULAR | Status: AC | PRN
  Administered 2023-05-16: 100 mL via INTRAVENOUS

## 2023-05-16 NOTE — ED Triage Notes (Signed)
 Right lower abdo/ groin pain Notice a few days ago

## 2023-05-16 NOTE — ED Provider Notes (Signed)
 [place] EMERGENCY DEPARTMENT AT [facility] Provider Note   CSN: [id number] Arrival date & time: 05/16/23  1426     History  Chief Complaint  Patient presents with   Abdominal Pain    Randall [name] is a 68 y.o. male.  Patient with history of head and neck cancer, status post radiation, history of lymphedema --presents to the emergency department today for evaluation of right groin and abdominal pain.  Patient developed a painful lump in the groin.  It is noticeable when he is standing.  Denies any injuries.  No vomiting, diarrhea or constipation.  Very tender with palpation.  Patient was seen at urgent care prior to arrival, sent for CT imaging.  Patient has been taking Hycet at home, last dose was this morning.  No history of abdominal surgeries.  No history of hernias.       Home Medications Prior to Admission medications   Medication Sig Start Date End Date Taking? Authorizing Provider  aspirin  EC 81 MG tablet Take 81 mg by mouth daily. Swallow whole.    [provider]  atorvastatin  (LIPITOR) 20 MG tablet Take 20 mg by mouth daily.    [provider]  carvedilol  (COREG ) 25 MG tablet Take 25 mg by mouth 2 (two) times daily with a meal.    [provider]  cetirizine (ZYRTEC) 10 MG tablet Take 10 mg by mouth every evening.    [provider]  chlorhexidine  (PERIDEX ) 0.12 % solution Use as directed 10 mLs in the mouth or throat at bedtime. 02/24/23   [provider]  chlorthalidone  (HYGROTON ) 25 MG tablet Take 1 tablet (25 mg total) by mouth daily. 05/03/23   Arleen Lacer, MD  HYDROcodone -acetaminophen  (HYCET) 7.5-325 mg/15 ml solution Take 10 mL up to TID as needed for pain - not to exceed 30mL daily. Patient taking differently: Take 10 mLs by mouth every 4 (four) hours as needed for moderate pain (pain score 4-6). 02/27/23   Colie Dawes, MD  LORazepam  (ATIVAN ) 0.5 MG tablet Take 0.5 mg by mouth every 8 (eight) hours as  needed for anxiety.    [provider]  meloxicam  (MOBIC ) 15 MG tablet Take 15 mg by mouth daily. 09/07/20   [provider]  Multiple Vitamin (MULTIVITAMIN PO) Take 1 tablet by mouth daily.    [provider]  ondansetron  (ZOFRAN ) 8 MG tablet Take 8 mg by mouth every 8 (eight) hours as needed for nausea or vomiting.    [provider]  OVER THE COUNTER MEDICATION Take 30 mLs by mouth every 6 (six) hours as needed (Pain). PainQuil    [provider]  pantoprazole (PROTONIX) 40 MG tablet Take 40 mg by mouth daily.    [provider]  potassium chloride  (MICRO-K ) 10 MEQ CR capsule Take 10 mEq by mouth 2 (two) times daily. 03/06/23   [provider]  sodium fluoride (FLUORISHIELD) 1.1 % GEL dental gel Place 1 Application onto teeth at bedtime. 07/07/22   [provider]  tadalafil (CIALIS) 20 MG tablet Take 20 mg by mouth daily as needed for erectile dysfunction. 07/27/20   [provider]  telmisartan (MICARDIS) 80 MG tablet Take 80 mg by mouth daily.    [provider]  triamcinolone cream (KENALOG) 0.1 % Apply 1 application topically 3 (three) times daily as needed for itching (rash). 09/10/20   [provider]  TRULICITY 0.75 MG/0.5ML SOAJ Inject 0.75 mg into the skin once a week.  [provider]      Allergies    Norgesic forte [orphenadrine-aspirin -caffeine] and Latex    Review of Systems   Review of Systems  Physical Exam Updated Vital Signs BP (!) 154/68 (BP Location: Right Arm)   Pulse 72   Temp 98.1 F (36.7 C) (Oral)   Resp 16   SpO2 98%  Physical Exam Vitals and nursing note reviewed.  Constitutional:      General: He is not in acute distress.    Appearance: He is well-developed.  HENT:     Head: Normocephalic and atraumatic.  Eyes:     General:        Right eye: No discharge.        Left eye: No discharge.     Conjunctiva/sclera: Conjunctivae normal.   Cardiovascular:     Rate and Rhythm: Normal rate and regular rhythm.     Heart sounds: Normal heart sounds.  Pulmonary:     Effort: Pulmonary effort is normal.     Breath sounds: Normal breath sounds.  Abdominal:     Palpations: Abdomen is soft.     Tenderness: There is abdominal tenderness in the right lower quadrant. There is no guarding or rebound. Negative signs include Murphy's sign and McBurney's sign.     Hernia: No hernia is present.     Comments: No incarcerated hernia palpated on exam, patient very tender in the right lower quadrant.  Musculoskeletal:     Cervical back: Normal range of motion and neck supple.  Skin:    General: Skin is warm and dry.  Neurological:     Mental Status: He is alert.     ED Results / Procedures / Treatments   Labs (all labs ordered are listed, but only abnormal results are displayed) Labs Reviewed  COMPREHENSIVE METABOLIC PANEL WITH GFR - Abnormal; Notable for the following components:      Result Value   Sodium 131 (*)    Chloride 94 (*)    Total Protein 6.4 (*)    All other components within normal limits  CBC - Abnormal; Notable for the following components:   RBC 3.44 (*)    Hemoglobin 10.8 (*)    HCT 31.7 (*)    All other components within normal limits  LIPASE, BLOOD  URINALYSIS, ROUTINE W REFLEX MICROSCOPIC    EKG None  Radiology No results found.  Procedures Procedures    Medications Ordered in ED Medications - No data to display  ED Course/ Medical Decision Making/ A&P    Patient seen and examined. History obtained directly from patient. Work-up including labs, imaging, EKG ordered in triage, if performed, were reviewed.    Labs/EKG: Independently reviewed and interpreted.  This included: CBC with normal white blood cell count, mild anemia hemoglobin 10.8; CMP with normal kidney function, slightly low sodium but improved from recent values; lipase normal.  UA without signs of infection.  Imaging: Ordered CT  abdomen pelvis  Medications/Fluids: Ordered: Dilaudid , Zofran   Most recent vital signs reviewed and are as follows: BP (!) 154/68 (BP Location: Right Arm)   Pulse 72   Temp 98.1 F (36.7 C) (Oral)   Resp 16   SpO2 98%   Initial impression: Right lower quadrant and groin pain, evaluate for possible hernia.  6:56 PM Reassessment performed. Patient appears comfortable.  Imaging personally visualized and interpreted including: Agree fat-containing inguinal hernia, no sign of appendicitis.  No emergent findings.  Reviewed pertinent lab work and imaging with patient at  bedside. Questions answered.   Most current vital signs reviewed and are as follows: BP (!) 154/68 (BP Location: Right Arm)   Pulse 72   Temp 98.1 F (36.7 C) (Oral)   Resp 16   SpO2 98%   Plan: Discharge  Prescriptions written for: None  Other home care instructions discussed: Consider hernia belt, rest, no heavy lifting, bending pushing, or pulling.  ED return instructions discussed: The patient was urged to return to the Emergency Department immediately with worsening of current symptoms, worsening abdominal pain, persistent vomiting, blood noted in stools, fever, or any other concerns. The patient verbalized understanding.   Follow-up instructions discussed: Patient encouraged to follow-up with general surgery referral in 1 week.                               Medical Decision Making Amount and/or Complexity of Data Reviewed Labs: ordered. Radiology: ordered.   For this patient's complaint of abdominal pain, the following conditions were considered on the differential diagnosis: gastritis/PUD, enteritis/duodenitis, appendicitis, cholelithiasis/cholecystitis, cholangitis, pancreatitis, ruptured viscus, colitis, diverticulitis, small/large bowel obstruction, proctitis, cystitis, pyelonephritis, ureteral colic, aortic dissection, aortic aneurysm. Atypical chest etiologies were also considered including ACS, PE,  and pneumonia.  CT scan reassuring as well as lab work.  No appendicitis.  Bilateral fat-containing hernias, question whether he has a more significant hernia with standing.  Certainly no evidence of incarceration or strangulation today.  Symptoms controlled.  Patient comfortable with discharge.  The patient's vital signs, pertinent lab work and imaging were reviewed and interpreted as discussed in the ED course. Hospitalization was considered for further testing, treatments, or serial exams/observation. However as patient is well-appearing, has a stable exam, and reassuring studies today, I do not feel that they warrant admission at this time. This plan was discussed with the patient who verbalizes agreement and comfort with this plan and seems reliable and able to return to the Emergency Department with worsening or changing symptoms.           Final Clinical Impression(s) / ED Diagnoses Final diagnoses:  Right groin pain    Rx / DC Orders ED Discharge Orders     None         Lyna Sandhoff, PA-C 05/16/23 1857    Sallyanne Creamer, DO 05/19/23 0037

## 2023-05-16 NOTE — Discharge Instructions (Addendum)
 Please read and follow all provided instructions.  Your diagnoses today include:  1. Right groin pain     Tests performed today include: Complete blood cell count: Shows mild anemia Complete metabolic panel: Low sodium but improved from previous Lipase (pancreas function test): Was normal Urinalysis (urine test): No infection CT scan of the abdomen pelvis: No signs of stuck bowel, you do have bilateral hernias containing fat Vital signs. See below for your results today.   Medications prescribed:  None  Take any prescribed medications only as directed.  Home care instructions:  Follow any educational materials contained in this packet.  Follow-up instructions: Please follow-up with the surgery referral in the next 1 week.   Return instructions:  SEEK IMMEDIATE MEDICAL ATTENTION IF: The pain does not go away or becomes severe  A temperature above 101F develops  Repeated vomiting occurs (multiple episodes)  The pain becomes localized to portions of the abdomen. The right side could possibly be appendicitis. In an adult, the left lower portion of the abdomen could be colitis or diverticulitis.  Blood is being passed in stools or vomit (bright red or black tarry stools)  You develop chest pain, difficulty breathing, dizziness or fainting, or become confused, poorly responsive, or inconsolable (young children) If you have any other emergent concerns regarding your health  Additional Information: Abdominal (belly) pain can be caused by many things. Your caregiver performed an examination and possibly ordered blood/urine tests and imaging (CT scan, x-rays, ultrasound). Many cases can be observed and treated at home after initial evaluation in the emergency department. Even though you are being discharged home, abdominal pain can be unpredictable. Therefore, you need a repeated exam if your pain does not resolve, returns, or worsens. Most patients with abdominal pain don't have to be  admitted to the hospital or have surgery, but serious problems like appendicitis and gallbladder attacks can start out as nonspecific pain. Many abdominal conditions cannot be diagnosed in one visit, so follow-up evaluations are very important.  Your vital signs today were: BP (!) 154/68 (BP Location: Right Arm)   Pulse 72   Temp 98.1 F (36.7 C) (Oral)   Resp 16   SpO2 98%  If your blood pressure (bp) was elevated above 135/85 this visit, please have this repeated by your doctor within one month. --------------

## 2023-05-19 ENCOUNTER — Ambulatory Visit: Payer: Self-pay | Admitting: General Surgery

## 2023-05-19 ENCOUNTER — Ambulatory Visit: Attending: Radiation Oncology

## 2023-05-19 ENCOUNTER — Telehealth: Payer: Self-pay

## 2023-05-19 DIAGNOSIS — R1310 Dysphagia, unspecified: Secondary | ICD-10-CM | POA: Diagnosis present

## 2023-05-19 NOTE — H&P (View-Only) (Signed)
 Chief Complaint: New Consultation (bilat inguinal hernia)       History of Present Illness: Randall [name] is a 68 y.o. male who is seen today as an office consultation at the request of Dr. Mariano Shiver for evaluation of New Consultation (bilat inguinal hernia) .   History of Present Illness Randall [name] is a 68 year old male with a history of throat cancer and lymphedema who presents with hernia issues.   He experiences a sudden onset of hernia symptoms on the right side, with a bulge that requires manual pressure to reduce. He uses a makeshift support to prevent protrusion. Pain is present without support and is significant without pain medication. He takes acetaminophen  and hydrocodone  7.5/325 mg, with only one dose remaining. No previous hernia surgeries.   He has a history of throat cancer treated with surgery and radiation, and he continues speech therapy and lymphedema treatments. He has high blood pressure and previously reversed diabetes through weight loss. He takes aspirin  81 mg daily.   He has experienced diarrhea for six weeks and vomiting for four weeks, which may have worsened the hernia symptoms. He continues daily activities but recently fell, suspecting a cracked rib. He reports right-sided pain when coughing and increased hernia symptoms on this side. No history of heart attacks or strokes and is not on blood thinners.       Review of Systems: A complete review of systems was obtained from the patient.  I have reviewed this information and discussed as appropriate with the patient.  See HPI as well for other ROS.   Review of Systems  Constitutional:  Negative for fever.  HENT:  Negative for congestion.   Eyes:  Negative for blurred vision.  Respiratory:  Negative for cough, shortness of breath and wheezing.   Cardiovascular:  Negative for chest pain and palpitations.  Gastrointestinal:  Negative for heartburn.  Genitourinary:  Negative for dysuria.  Musculoskeletal:   Negative for myalgias.  Skin:  Negative for rash.  Neurological:  Negative for dizziness and headaches.  Psychiatric/Behavioral:  Negative for depression and suicidal ideas.   All other systems reviewed and are negative.       Medical History: Past Medical History Past Medical History: Diagnosis Date  Diabetes mellitus without complication (CMS/HHS-HCC)    GERD (gastroesophageal reflux disease)    History of cancer    Hypertension         Problem List There is no problem list on file for this patient.     Past Surgical History Past Surgical History: Procedure Laterality Date  Throat cancer   2024      Allergies Allergies Allergen Reactions  Orphenadrine-Asa-Caffeine Other (See Comments) and Unknown     Felt strange   Unknown reaction  Latex Hives and Rash     Blisters      Medications Ordered Prior to Encounter Current Outpatient Medications on File Prior to Visit Medication Sig Dispense Refill  acetaminophen  (TYLENOL ) 500 MG tablet Take 1,000 mg by mouth 2 (two) times daily as needed      amLODIPine (NORVASC) 10 MG tablet TAKE 1 TABLET BY MOUTH EVERY DAY TO CONTROL BLOOD PRESSURE      aspirin  81 MG EC tablet Take 81 mg by mouth once daily      blood glucose diagnostic (FREESTYLE LITE STRIPS) test strip Test glucose BID   Diagnoses Code: E11.9      carvediloL  (COREG ) 25 MG tablet Take 25 mg by mouth  cetirizine (ZYRTEC) 10 MG tablet Take 10 mg by mouth every evening      chlorthalidone  25 MG tablet Take 1 tablet by mouth once daily      FUROsemide  (LASIX ) 40 MG tablet Take 40 mg by mouth once daily      HYDROcodone -acetaminophen  (HYCET) 7.5-325 mg/15 mL solution Take 10 mL up to TID as needed for pain - not to exceed 30mL daily.      LORazepam  (ATIVAN ) 0.5 MG tablet Take 0.5 mg by mouth every 8 (eight) hours as needed      meloxicam  (MOBIC ) 15 MG tablet Take 15 mg by mouth once daily      metFORMIN (GLUCOPHAGE-XR) 500 MG XR tablet TAKE 2 TABLETS BY MOUTH  TWICE A DAY AT BREAKFAST AND SUPPER TO CONTROL DIABETES      ondansetron  (ZOFRAN ) 8 MG tablet 1 tab(s) orally 3 times a day for 30 days      pantoprazole (PROTONIX) 40 MG DR tablet Take 40 mg by mouth once daily      potassium chloride  (MICRO-K ) 10 MEQ ER capsule Take 10 mEq by mouth 2 (two) times daily      PREVIDENT 5000 SENSITIVE 1.1-5 % Take 1 Application by mouth      tadalafiL (CIALIS) 20 MG tablet Take 20 mg by mouth once daily as needed      telmisartan (MICARDIS) 80 MG tablet Take 80 mg by mouth once daily      triamcinolone 0.1 % cream Apply topically      TRULICITY 0.75 mg/0.5 mL subcutaneous pen injector Inject 0.75 mg subcutaneously        No current facility-administered medications on file prior to visit.      Family History Family History Problem Relation Age of Onset  High blood pressure (Hypertension) Mother    Hyperlipidemia (Elevated cholesterol) Mother    Diabetes Father    Colon cancer Father        Tobacco Use History Social History    Tobacco Use Smoking Status Never Smokeless Tobacco Never      Social History Social History    Socioeconomic History  Marital status: Unknown Tobacco Use  Smoking status: Never  Smokeless tobacco: Never Vaping Use  Vaping status: Never Used Substance and Sexual Activity  Alcohol use: Not Currently  Drug use: Never    Social Drivers of Health    Food Insecurity: No Food Insecurity (04/06/2023)   Received from [facility]   Hunger Vital Sign    Worried About Running Out of Food in the Last Year: Never true    Ran Out of Food in the Last Year: Never true Transportation Needs: No Transportation Needs (04/06/2023)   Received from [facility] - Transportation    Lack of Transportation (Medical): No    Lack of Transportation (Non-Medical): No Social Connections: Unknown (04/06/2023)   Received from [facility]   Social Connection and Isolation Panel [NHANES]    Frequency of Communication with  Friends and Family: Patient declined    Frequency of Social Gatherings with Friends and Family: Patient declined    Attends Religious Services: Patient declined    Active Member of Clubs or Organizations: Patient declined    Attends Banker Meetings: Patient declined    Marital Status: Married Housing Stability: Unknown (05/19/2023)   Housing Stability Vital Sign    Homeless in the Last Year: No      Objective:     Vitals:   05/19/23 0846  BP: 138/67 Pulse: 91 Temp: 37.1 C (98.7 F) SpO2: 97% Weight: 81.6 kg (179 lb 12.8 oz) Height: 177.8 cm (5\' 10" ) PainSc:   2   Body mass index is 25.8 kg/m. Physical Exam Constitutional:      Appearance: Normal appearance.  HENT:     Head: Normocephalic and atraumatic.     Nose: Nose normal. No congestion.     Mouth/Throat:     Mouth: Mucous membranes are moist.     Pharynx: Oropharynx is clear.  Eyes:     Pupils: Pupils are equal, round, and reactive to light.  Cardiovascular:     Rate and Rhythm: Normal rate and regular rhythm.     Pulses: Normal pulses.     Heart sounds: Normal heart sounds. No murmur heard.    No friction rub. No gallop.  Pulmonary:     Effort: Pulmonary effort is normal. No respiratory distress.     Breath sounds: Normal breath sounds. No stridor. No wheezing, rhonchi or rales.  Abdominal:     General: Abdomen is flat.     Hernia: A hernia is present. Hernia is present in the left inguinal area and right inguinal area.     Comments: R>L  Musculoskeletal:        General: Normal range of motion.     Cervical back: Normal range of motion.  Skin:    General: Skin is warm and dry.  Neurological:     General: No focal deficit present.     Mental Status: He is alert and oriented to person, place, and time.  Psychiatric:        Mood and Affect: Mood normal.        Thought Content: Thought content normal.          Assessment and Plan: Diagnoses and all orders for this visit:   Bilateral  inguinal hernia without obstruction or gangrene, recurrence not specified     Randall [name] is a 68 y.o. male    1.  We will proceed to the OR for a lap bilateral inguinal hernia repair with mesh. 2. All risks and benefits were discussed with the patient, to generally include infection, bleeding, damage to surrounding structures, acute and chronic nerve pain, and recurrence. Alternatives were offered and described.  All questions were answered and the patient voiced understanding of the procedure and wishes to proceed at this point.             No follow-ups on file.   Shela Derby, MD, [facility], [place] [facility]

## 2023-05-19 NOTE — Patient Instructions (Signed)
   Contact your ENT or Dr. Lurena Sally if you have the following:  - regularly coughing when eating food or drinking liquids  - excessive throat clearing , or a "gurgly" sounding voice when eating or drinking  - food you could eat is now not passing through your throat, for longer than a week

## 2023-05-19 NOTE — Progress Notes (Signed)
 [name] presents to the clinic for a follow up in the clinic. 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    Pain issues, if any: Patient having 8 out of 10 neck pain. Patient has lymphedema, has been getting worse. Has been getting physical therapy. Using a feeding tube?: Patient denies Weight changes, if any: Patient has lost weight has been 165 pounds. Swallowing issues, if any: Denies Smoking or chewing tobacco? Denies Using fluoride toothpaste daily? Yes twice a day in the morning and night. Last ENT visit was on: May 02, 2023 with Dr. Caldwell Castles Other notable issues, if any:  Patient has been having trouble sleeping at night. Having severe lymphedema, having to wear a neck brace.  BP (!) 175/78 (BP Location: Left Arm, Patient Position: Sitting, Cuff Size: Normal)   Pulse (!) 56   Temp (!) 97.4 F (36.3 C)   Resp 20   Ht 5\' 10"  (1.778 m)   Wt 175 lb 12.8 oz (79.7 kg)   SpO2 100%   BMI 25.22 kg/m     Wt Readings from Last 3 Encounters:  05/24/23 175 lb 12.8 oz (79.7 kg)  04/05/23 188 lb 15 oz (85.7 kg)  03/27/23 189 lb (85.7 kg)

## 2023-05-19 NOTE — H&P (Signed)
 Chief Complaint: New Consultation (bilat inguinal hernia)       History of Present Illness: Randall [name] is a 68 y.o. male who is seen today as an office consultation at the request of Dr. Mariano Shiver for evaluation of New Consultation (bilat inguinal hernia) .   History of Present Illness Randall [name] is a 68 year old male with a history of throat cancer and lymphedema who presents with hernia issues.   He experiences a sudden onset of hernia symptoms on the right side, with a bulge that requires manual pressure to reduce. He uses a makeshift support to prevent protrusion. Pain is present without support and is significant without pain medication. He takes acetaminophen  and hydrocodone  7.5/325 mg, with only one dose remaining. No previous hernia surgeries.   He has a history of throat cancer treated with surgery and radiation, and he continues speech therapy and lymphedema treatments. He has high blood pressure and previously reversed diabetes through weight loss. He takes aspirin  81 mg daily.   He has experienced diarrhea for six weeks and vomiting for four weeks, which may have worsened the hernia symptoms. He continues daily activities but recently fell, suspecting a cracked rib. He reports right-sided pain when coughing and increased hernia symptoms on this side. No history of heart attacks or strokes and is not on blood thinners.       Review of Systems: A complete review of systems was obtained from the patient.  I have reviewed this information and discussed as appropriate with the patient.  See HPI as well for other ROS.   Review of Systems  Constitutional:  Negative for fever.  HENT:  Negative for congestion.   Eyes:  Negative for blurred vision.  Respiratory:  Negative for cough, shortness of breath and wheezing.   Cardiovascular:  Negative for chest pain and palpitations.  Gastrointestinal:  Negative for heartburn.  Genitourinary:  Negative for dysuria.  Musculoskeletal:   Negative for myalgias.  Skin:  Negative for rash.  Neurological:  Negative for dizziness and headaches.  Psychiatric/Behavioral:  Negative for depression and suicidal ideas.   All other systems reviewed and are negative.       Medical History: Past Medical History Past Medical History: Diagnosis Date  Diabetes mellitus without complication (CMS/HHS-HCC)    GERD (gastroesophageal reflux disease)    History of cancer    Hypertension         Problem List There is no problem list on file for this patient.     Past Surgical History Past Surgical History: Procedure Laterality Date  Throat cancer   2024      Allergies Allergies Allergen Reactions  Orphenadrine-Asa-Caffeine Other (See Comments) and Unknown     Felt strange   Unknown reaction  Latex Hives and Rash     Blisters      Medications Ordered Prior to Encounter Current Outpatient Medications on File Prior to Visit Medication Sig Dispense Refill  acetaminophen  (TYLENOL ) 500 MG tablet Take 1,000 mg by mouth 2 (two) times daily as needed      amLODIPine (NORVASC) 10 MG tablet TAKE 1 TABLET BY MOUTH EVERY DAY TO CONTROL BLOOD PRESSURE      aspirin  81 MG EC tablet Take 81 mg by mouth once daily      blood glucose diagnostic (FREESTYLE LITE STRIPS) test strip Test glucose BID   Diagnoses Code: E11.9      carvediloL  (COREG ) 25 MG tablet Take 25 mg by mouth  cetirizine (ZYRTEC) 10 MG tablet Take 10 mg by mouth every evening      chlorthalidone  25 MG tablet Take 1 tablet by mouth once daily      FUROsemide  (LASIX ) 40 MG tablet Take 40 mg by mouth once daily      HYDROcodone -acetaminophen  (HYCET) 7.5-325 mg/15 mL solution Take 10 mL up to TID as needed for pain - not to exceed 30mL daily.      LORazepam  (ATIVAN ) 0.5 MG tablet Take 0.5 mg by mouth every 8 (eight) hours as needed      meloxicam  (MOBIC ) 15 MG tablet Take 15 mg by mouth once daily      metFORMIN (GLUCOPHAGE-XR) 500 MG XR tablet TAKE 2 TABLETS BY MOUTH  TWICE A DAY AT BREAKFAST AND SUPPER TO CONTROL DIABETES      ondansetron  (ZOFRAN ) 8 MG tablet 1 tab(s) orally 3 times a day for 30 days      pantoprazole (PROTONIX) 40 MG DR tablet Take 40 mg by mouth once daily      potassium chloride  (MICRO-K ) 10 MEQ ER capsule Take 10 mEq by mouth 2 (two) times daily      PREVIDENT 5000 SENSITIVE 1.1-5 % Take 1 Application by mouth      tadalafiL (CIALIS) 20 MG tablet Take 20 mg by mouth once daily as needed      telmisartan (MICARDIS) 80 MG tablet Take 80 mg by mouth once daily      triamcinolone 0.1 % cream Apply topically      TRULICITY 0.75 mg/0.5 mL subcutaneous pen injector Inject 0.75 mg subcutaneously        No current facility-administered medications on file prior to visit.      Family History Family History Problem Relation Age of Onset  High blood pressure (Hypertension) Mother    Hyperlipidemia (Elevated cholesterol) Mother    Diabetes Father    Colon cancer Father        Tobacco Use History Social History    Tobacco Use Smoking Status Never Smokeless Tobacco Never      Social History Social History    Socioeconomic History  Marital status: Unknown Tobacco Use  Smoking status: Never  Smokeless tobacco: Never Vaping Use  Vaping status: Never Used Substance and Sexual Activity  Alcohol use: Not Currently  Drug use: Never    Social Drivers of Health    Food Insecurity: No Food Insecurity (04/06/2023)   Received from [facility]   Hunger Vital Sign    Worried About Running Out of Food in the Last Year: Never true    Ran Out of Food in the Last Year: Never true Transportation Needs: No Transportation Needs (04/06/2023)   Received from [facility]    Lack of Transportation (Medical): No    Lack of Transportation (Non-Medical): No Social Connections: Unknown (04/06/2023)   Received from [facility]   Social Connection and Isolation Panel [NHANES]    Frequency of Communication with  Friends and Family: Patient declined    Frequency of Social Gatherings with Friends and Family: Patient declined    Attends Religious Services: Patient declined    Active Member of Clubs or Organizations: Patient declined    Attends Banker Meetings: Patient declined    Marital Status: Married Housing Stability: Unknown (05/19/2023)   Housing Stability Vital Sign    Homeless in the Last Year: No      Objective:     Vitals:   05/19/23 0846  BP: 138/67 Pulse: 91 Temp: 37.1 C (98.7 F) SpO2: 97% Weight: 81.6 kg (179 lb 12.8 oz) Height: 177.8 cm (5\' 10" ) PainSc:   2   Body mass index is 25.8 kg/m. Physical Exam Constitutional:      Appearance: Normal appearance.  HENT:     Head: Normocephalic and atraumatic.     Nose: Nose normal. No congestion.     Mouth/Throat:     Mouth: Mucous membranes are moist.     Pharynx: Oropharynx is clear.  Eyes:     Pupils: Pupils are equal, round, and reactive to light.  Cardiovascular:     Rate and Rhythm: Normal rate and regular rhythm.     Pulses: Normal pulses.     Heart sounds: Normal heart sounds. No murmur heard.    No friction rub. No gallop.  Pulmonary:     Effort: Pulmonary effort is normal. No respiratory distress.     Breath sounds: Normal breath sounds. No stridor. No wheezing, rhonchi or rales.  Abdominal:     General: Abdomen is flat.     Hernia: A hernia is present. Hernia is present in the left inguinal area and right inguinal area.     Comments: R>L  Musculoskeletal:        General: Normal range of motion.     Cervical back: Normal range of motion.  Skin:    General: Skin is warm and dry.  Neurological:     General: No focal deficit present.     Mental Status: He is alert and oriented to person, place, and time.  Psychiatric:        Mood and Affect: Mood normal.        Thought Content: Thought content normal.          Assessment and Plan: Diagnoses and all orders for this visit:   Bilateral  inguinal hernia without obstruction or gangrene, recurrence not specified     Randall [name] is a 68 y.o. male    1.  We will proceed to the OR for a lap bilateral inguinal hernia repair with mesh. 2. All risks and benefits were discussed with the patient, to generally include infection, bleeding, damage to surrounding structures, acute and chronic nerve pain, and recurrence. Alternatives were offered and described.  All questions were answered and the patient voiced understanding of the procedure and wishes to proceed at this point.             No follow-ups on file.   Shela Derby, MD, [facility], [place] [facility]

## 2023-05-19 NOTE — Telephone Encounter (Signed)
   Pre-operative Risk Assessment    Patient Name: Randall [name]  DOB: 09-24-1955 MRN: [id number]   Date of last office visit: 03/27/2023, Dr. [name], MD Date of next office visit: NONE   Request for Surgical Clearance    Procedure:  Hernia surgery  Date of Surgery:  Clearance TBD                                Surgeon: Dr. Shela Derby, MD Surgeon's Group or Practice Name: [facility] Phone number: [phone] Fax number: [phone]   Type of Clearance Requested:   - Medical  - Pharmacy:  Hold Aspirin  5 days   Type of Anesthesia:  General    Additional requests/questions:    Tyrus Gallus   05/19/2023, 10:35 AM

## 2023-05-19 NOTE — Therapy (Signed)
 OUTPATIENT SPEECH LANGUAGE PATHOLOGY ONCOLOGY TREATMENT/ RECERTIFICATION-D/C   Patient Name: Randall [name] MRN: [id number] DOB:[birth year], 68 y.o., male Today's Date: 05/19/2023  PCP: Edger Goody, MD REFERRING PROVIDER: Colie Dawes, MD  END OF SESSION:  End of Session - 05/19/23 1018     Visit Number 6    Number of Visits 7    Date for SLP Re-Evaluation 08/17/23    SLP Start Time 1018    SLP Stop Time  1040    SLP Time Calculation (min) 22 min    Activity Tolerance Patient tolerated treatment well               Past Medical History:  Diagnosis Date   CHF (congestive heart failure) (HCC)    Complication of anesthesia    Diabetes mellitus without complication (HCC)    Elevated coronary artery calcium  score 02/02/2021   GERD (gastroesophageal reflux disease)    History of kidney stones 2014   Hypertension    Kidney stone 06/17/2015   PONV (postoperative nausea and vomiting)    Rupture of right quadriceps tendon 09/21/2020   - s/p srugical repair   Tuberculosis 1980   Type 2 diabetes mellitus without complication, without long-term current use of insulin  (HCC) 03/21/2020   Past Surgical History:  Procedure Laterality Date   KIDNEY SURGERY     KNEE SURGERY     LITHOTRIPSY     MICROLARYNGOSCOPY Bilateral 06/17/2022   Procedure: DIRECT LARYNGOSCOPY WITH BIOPSY;  Surgeon: Daleen Dubs, DO;  Location: [facility] OR;  Service: ENT;  Laterality: Bilateral;   QUADRICEPS TENDON REPAIR Right 09/25/2020   Procedure: REPAIR RIGHT QUADRICEP TENDON;  Surgeon: Timothy Ford, MD;  Location: [facility] OR;  Service: Orthopedics;  Laterality: Right;   TONSILLECTOMY     Patient Active Problem List   Diagnosis Date Noted   Hypokalemia 04/06/2023   Anemia 04/06/2023   Hyponatremia 04/05/2023   ARF (acute renal failure) (HCC) 04/05/2023   CKD (chronic kidney disease), symptom management only, stage 2 (mild) 03/29/2023   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   Hyperlipidemia LDL goal <70 02/21/2022   Elevated coronary artery calcium  score-683 01/26/2021   Thoracic aortic atherosclerosis (HCC) 01/26/2021   Decreased left ventricular systolic function 12/25/2020   Resistant hypertension 11/13/2020   Right bundle branch block 11/13/2020   Hypertensive heart disease with chronic diastolic congestive heart failure (HCC) 11/13/2020   Quadriceps tendon rupture, right, sequela    Type 2 diabetes mellitus with complication, without long-term current use of insulin  (HCC) 03/21/2020   SPEECH THERAPY RECERT-DISCHARGE SUMMARY  Visits from Start of Care: 6  Current functional level related to goals / functional outcomes: See notes below. Pt will be seen today with current LTGs enforced and then will be discharged.    Remaining deficits: None seen today.   Education / Equipment: See therapy notes.   Patient agrees to discharge. Patient goals were met. Patient is being discharged due to meeting the stated rehab goals..       ONSET DATE: January 2024   REFERRING DIAG: Pyriform Sinus Cancer  THERAPY DIAG:  Dysphagia, unspecified type  Rationale for Evaluation and Treatment: Rehabilitation  SUBJECTIVE:   SUBJECTIVE STATEMENT: Pt still eating a wide variety of items.  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 Hx: He presented with bilateral neck swelling in Jan 2024. He was also  diagnosed with COVID around that time and he attributed his symptoms to that. The right neck swelling continued and he presented to his PCP in April 2024 for further evaluation. 05/05/22 A soft tissue neck US  demonstrated a nonspecific 3.2 x 1.5 x 2.5 cm hypoechoic lesion in the right neck, correlating with the palpable site of concern. 05/25/22 CT neck demonstrated a soft tissue mass centered within the right piriform sinus,  measuring up to 22 x 8 mm, concerning  for malignancy. CT also demonstrated a right level 3 lymph node measuring up to 2.8 x 1.8 cm, with central necrosis (correlating with the hypoechoic mass seen on neck ultrasound) suspicious for metastatic disease, and indeterminate asymmetric prominence of right level 2A lymph node, measuring up to 16 x 10 mm   06/14/22 He saw Dr. Westley Hammers. Laryngoscopy performed during this visit revealed a mass lesion of the right piriform sinus which appeared to extend in to the arytenoid and partially onto the area epiglottic fold. 06/17/22 He underwent direct laryngoscopy to obtain biopsies. Biopsy of the right pyriform sinus showed findings consistent with invasive poorly differentiated squamous cell carcinoma (nonkeratinizing). A post cricoid biopsy was also collected and showed no evidence of malignancy. 06/28/22 He met with Dr. Caldwell Castles to discuss treatment options- chemo/radiation vs surgery with possible post op radiation.07/04/22 Consult with Dr. Lurena Sally. 07/07/22 PET demonstrated the right piriform sinus primary measuring 1.4 cm with an SUV max of 5.9, and the known level 3 ipsilateral nodal metastasis measuring 2.7 cm with an SUV max of 3.2. PET otherwise showed no evidence of extracervical hypermetabolic metastatic disease, and a nonspecific 2 mm LLL pulmonary nodule. He met with Dr. Arno Bibles on 6/28 to discuss the option of chemotherapy but ultimately decided to proceed with surgery. 08/26/22 endoscopic CO2 laser excision of the hypopharyngeal mass and bilateral neck dissection by Dr. Caldwell Castles. Based on final pathology showing no evidence of extra nodal extension in the positive excised lymph node chemotherapy is not recommended only adjuvant radiation. 09/21/22 Follow up with Dr. Lurena Sally. He will receive post operative radiation only. Treatment plan:  He will receive 35 fractions of radiation to his Hypopharynx and bilateral neck.  He started on 9/30 and he will complete 11/8  PAIN:  Are you having pain? No  FALLS: Has  patient fallen in last 6 months?  See PT evaluation for details  PATIENT GOALS: "Do everything I can"  OBJECTIVE:  Note: Objective measures were completed at Evaluation unless otherwise noted.   TODAY'S TREATMENT:                                                                                                                                         DATE:   05/19/23: Pt wearing compression garment for lymphedema - states swallowing is a bit different with it placed. SLP confirmed what pt has been doing, to loosen it with POs if he desires, then  tighten it back up after POs. With POs today SLP observed no overt s/sx oral or pharyngeal dysphagia with cereal bar and water. HEP-pt was independent. Has been performing 30-40 reps/day (6 reps Shaker). SLP told pt to perform HEP until 07/10/23 then twice a week. Pt told this back to SLP later in session.   03/13/23:Pt ate fig bar and water today without overt s/sx oral or pharyngeal sx. Pt states that rarely he will cough with bready foods and liquids; SLP told pt to not mix solids and liquids at once, but wait until solid is out of oral cavity prior to initiating liquid sips. Also recommended that pt take 1-3 drops of water if food is dry instead of sips/drinks. Pt was independent with HEP today - states that he is getting 20 reps/day of Effortful, Masako, "he", and Mendelsohn - SLP suggested 30 reps/day (6/day of Shaker) and pt stated he would do this. If pt presents similarly next session, d/c could be considered.  01/17/23: "I'm still having trouble with spicy food" (burns). Pt has eaten foods from a wide variety of textures in the last month, from dys I to regular diet. He IS avoiding crunchy foods like crackers and chips. Today he ate fig bar and water without any signs of oral or overt s/sx pharyngeal deficits.  Pt is doing the HEP, reportedly as prescribed (20 reps/day except Shaker), at least 5 days/week. He was educated about when to decr to x2-3/week  (req'd total A). His performance with HEP was done independently.   12/20/22:  Pt ate black cherry and raspberry yogurt this week. With POs today pt ate applesauce and drank water without any overt s/sx oral or pharyngeal dysphagia.  SLP provided pt with overt s/sx aspiration PNA and pt returned telling SLP 3 of these.  With HEP, pt's frequency has decreased due to pain. Procedure today was WNL; SLP strongly encouraged pt to incr reps to 20/day for each (except Shaker),  no less than 5 reps at a time.  11/17/22: Pt has been performing HEP as well as he can - is holding to scope of HEP as directed with reduced reps. He performed one rep of each exercise for SLP with independence.  He told SLP rationale for HEP with independence. SLP told pt and wife about food journal and pt told SLP benefits of this.   10/13/22 (eval): Research states the risk for dysphagia increases due to radiation and/or chemotherapy treatment due to a variety of factors, so SLP educated the pt about the possibility of reduced/limited ability for PO intake during rad tx. SLP also educated pt regarding possible changes to swallowing musculature after rad tx, and why adherence to dysphagia HEP provided today and PO consumption was necessary to inhibit muscle fibrosis following rad tx and to mitigate muscle disuse atrophy. SLP informed pt why this would be detrimental to their swallowing status and to their pulmonary health. Pt demonstrated understanding of these things to SLP. SLP encouraged pt to safely eat and drink as deep into their radiation/chemotherapy as possible to provide the best possible long-term swallowing outcome for pt.  SLP then developed an individualized HEP for pt involving oral and pharyngeal strengthening and ROM and pt was instructed how to perform these exercises, including SLP demonstration. After SLP demonstration, pt return demonstrated each exercise. SLP ensured pt performance was correct prior to educating pt on  next exercise. Pt required min cues faded to modified independent to perform HEP. Pt was instructed to complete this  program 6-7 days/week, at least 2 times a day until 6 months after his or her last day of rad tx, and then x2 a week after that, indefinitely. Among other modifications for days when pt cannot functionally swallow, SLP also suggested pt to perform only non-swallowing tasks on the handout/HEP, and if necessary to cycle through the swallowing portion so the full program of exercises can be completed instead of fatiguing on one of the swallowing exercises and being unable to perform the other swallowing exercises. SLP instructed that swallowing exercises should then be added back into the regimen as pt is able to do so.   PATIENT EDUCATION: Education details: see "today's treatment" Person educated: Patient Education method: See "today's treatment" Education comprehension: see "Today's treatment"  ASSESSMENT:  CLINICAL IMPRESSION: RECERT-D/C today. Pt's voice quality is normal. Patient is a 68 y.o. M who was seen today for treatment of swallowing following radiation/chemoradiation therapy. Today pt ate cereal bar and drank thin liquids without overt s/sx oral or pharyngeal difficulty. He has had a lot of regular food items as well since last session, without overt s/sx aspiration PNA. At this time pt swallowing is deemed WNL/WFL with regular diet with caveats as pt is not eating highly crunchy foods. Data indicate that pt's swallow ability could decrease over time following the conclusion of that therapy due to muscle disuse atrophy and/or muscle fibrosis. Pt agrees with SLP that d/c is appropriate today.     OBJECTIVE IMPAIRMENTS: include dysphagia. These impairments are limiting patient from effectively communicating at home and in community and safety when swallowing. Factors affecting potential to achieve goals and functional outcome are  none noted . Patient will benefit from skilled  SLP services to address above impairments and improve overall function.   REHAB POTENTIAL: Good   GOALS: Goals reviewed with patient? No   SHORT TERM GOALS: Target: 3rd total session   1. Pt will complete HEP with modified independence in 2 sessions Baseline: 11/17/22 Goal status: met   2.  pt will tell SLP why pt is completing HEP with modified independence Baseline:  Goal status: met   3.  pt will describe 3 overt s/s aspiration PNA with modified independence Baseline:  Goal status: met   4.  pt will tell SLP how a food journal could hasten return to a more normalized diet Baseline:  Goal status: met     LONG TERM GOALS: Target: 7th total session   1.  pt will complete HEP with independence over two visits Baseline: 11//7/24 Goal status: Met   2.  pt will describe how to modify HEP over time, and the timeline associated with reduction in HEP frequency with modified independence over two sessions Baseline:  Goal status: met     PLAN: Discharge today   PLANNED INTERVENTIONS: Aspiration precaution training, Pharyngeal strengthening exercises, Diet toleration management , Trials of upgraded texture/liquids, SLP instruction and feedback, Compensatory strategies, and Patient/family education    [facility], CCC-SLP 05/19/2023, 10:49 AM

## 2023-05-22 NOTE — Telephone Encounter (Signed)
 Dr. Addie Holstein,   You saw this patient on 03/27/2023. Per office protocol, will you please comment on medical clearance for hernia repair?  Please route your response to [facility] Preop. I will communicate with requesting office once you have given recommendations.   Thank you!  Morey Ar, NP

## 2023-05-22 NOTE — Telephone Encounter (Signed)
   Name: DEBRA WARMOTH  DOB: September 19, 1955  MRN: [id number]   Primary Cardiologist: [name], MD  Chart reviewed as part of pre-operative protocol coverage. KYRIE SIWICKI was last seen on 03/27/2023 by Dr. Addie Holstein.  Per Dr. Addie Holstein, "Mr. Kulaga's perioperative risk of a major cardiac event is 0.4% according to the Revised Cardiac Risk Index (RCRI).  Therefore, he is at low risk for perioperative complications.   His functional capacity is excellent at 8.97 METs according to the Duke Activity Status Index (DASI). Recommendations: According to ACC/AHA guidelines, no further cardiovascular testing needed.  The patient may proceed to surgery at acceptable risk.   Antiplatelet and/or Anticoagulation Recommendations: Aspirin  can be held for 5-7 days prior to his surgery.  Please resume Aspirin  post operatively when it is felt to be safe from a bleeding standpoint. No other meds need to be held."    I will route this recommendation to the requesting party via Epic fax function and remove from pre-op pool. Please call with questions.  Morey Ar, NP 05/22/2023, 11:21 AM

## 2023-05-23 ENCOUNTER — Telehealth: Payer: Self-pay | Admitting: *Deleted

## 2023-05-23 NOTE — Telephone Encounter (Signed)
 Called patient to remind of lab and fu appt. for 05-24-23, 10:30 am -lab and 11:00 am - fu appt., spoke with patient and he is aware of these appts.

## 2023-05-24 ENCOUNTER — Ambulatory Visit
Admission: RE | Admit: 2023-05-24 | Discharge: 2023-05-24 | Disposition: A | Discharge status: Home / Self Care | Payer: Self-pay | Source: Ambulatory Visit | Attending: Radiation Oncology | Admitting: Radiation Oncology

## 2023-05-24 ENCOUNTER — Ambulatory Visit
Admission: RE | Admit: 2023-05-24 | Discharge: 2023-05-24 | Disposition: A | Discharge status: Home / Self Care | Payer: Medicare HMO | Source: Ambulatory Visit | Attending: Radiation Oncology | Admitting: Radiation Oncology

## 2023-05-24 ENCOUNTER — Telehealth: Payer: Self-pay | Admitting: Radiation Oncology

## 2023-05-24 VITALS — BP 175/78 | HR 56 | Temp 97.4°F | Resp 20 | Ht 70.0 in | Wt 175.8 lb

## 2023-05-24 DIAGNOSIS — H9193 Unspecified hearing loss, bilateral: Secondary | ICD-10-CM

## 2023-05-24 DIAGNOSIS — Z85818 Personal history of malignant neoplasm of other sites of lip, oral cavity, and pharynx: Secondary | ICD-10-CM | POA: Insufficient documentation

## 2023-05-24 DIAGNOSIS — Z08 Encounter for follow-up examination after completed treatment for malignant neoplasm: Secondary | ICD-10-CM | POA: Diagnosis present

## 2023-05-24 DIAGNOSIS — C12 Malignant neoplasm of pyriform sinus: Secondary | ICD-10-CM | POA: Insufficient documentation

## 2023-05-24 LAB — BASIC METABOLIC PANEL - CANCER CENTER ONLY
Anion gap: 8 (ref 5–15)
BUN: 15 mg/dL (ref 8–23)
CO2: 30 mmol/L (ref 22–32)
Calcium: 9.9 mg/dL (ref 8.9–10.3)
Chloride: 90 mmol/L — ABNORMAL LOW (ref 98–111)
Creatinine: 1.15 mg/dL (ref 0.61–1.24)
GFR, Estimated: >60 mL/min (ref >60)
Glucose, Bld: 119 mg/dL — ABNORMAL HIGH (ref 70–99)
Potassium: 3.3 mmol/L — ABNORMAL LOW (ref 3.5–5.1)
Sodium: 128 mmol/L — ABNORMAL LOW (ref 135–145)

## 2023-05-24 LAB — TSH: TSH: 2.41 u[IU]/mL (ref 0.350–4.500)

## 2023-05-24 NOTE — Telephone Encounter (Signed)
 5/14 @ 3:05 pm Left voicemail with [facility] - ENT ref coord.  Also faxed outgoing referral/md's notes.  Waiting on call back of confirmation received referral and/or data/time pt's schedule.

## 2023-05-24 NOTE — Progress Notes (Signed)
 Radiation Oncology         [phone] ________________________________  Name: Randall [name] MRN: [id number]  Date: 05/24/2023  DOB: [birth year]  Follow-Up Visit Note  CC: Margaret Sharp, Hiawatha Lout, MD  Diagnosis and Prior Radiotherapy:       ICD-10-CM   1. Pyriform sinus cancer (HCC)  C12 CT Soft Tissue Neck W Contrast    CT Chest W Contrast    2. Bilateral hearing loss, unspecified hearing loss type  H91.93 Ambulatory referral to Audiology       Cancer Staging  Pyriform sinus cancer [facility]) Staging form: Pharynx - Hypopharynx, AJCC 8th Edition - Clinical stage from 07/04/2022: Stage IVA (cT2, cN2b, cM0) - Unsigned Stage prefix: Initial diagnosis - Pathologic stage from 09/21/2022: Stage IVA (pT2, pN2a, cM0) - Signed by Colie Dawes, MD on 09/22/2022 Stage prefix: Initial diagnosis   ==========DELIVERED PLANS==========  First Treatment Date: 2022-10-10 - Last Treatment Date: 2022-11-18   Plan Name: HN_Hypoph Site: Laryngopharynx Technique: IMRT Mode: Photon Dose Per Fraction: 2 Gy Prescribed Dose (Delivered / Prescribed): 60 Gy / 60 Gy Prescribed Fxs (Delivered / Prescribed): 30 / 30    Stage IV (pT2, pN2a, cM0) poorly differentiated squamous cell carcinoma; s/p CO2 laser excision of the hypopharyngeal mass and bilateral neck dissection with one positive LN and adjuvant radiation treatment  CHIEF COMPLAINT:  Here for follow-up and surveillance of throat cancer  Narrative: [name] presents today for a follow up after completing radiation to his hypopharynx/supraglottis on 11/18/2022.  Pain issues, if any: Patient having 8 out of 10 neck pain. Patient has lymphedema, has been getting worse. Has been getting physical therapy. Neck weakness. Using a feeding tube?: Patient denies Weight changes, if any: Patient has lost weight has been 165 pounds. Swallowing issues, if any: Denies Smoking or chewing tobacco? Denies Using fluoride toothpaste daily?  Yes twice a day in the morning and night. Last ENT visit was on: May 02, 2023 with Dr. Caldwell Castles. Patient underwent laryngoscope at that time.   "Impression: There is no evidence of disease on exam today. I will see him back in July , certainly sooner if there are problems or questions "  Other notable issues, if any:  Patient has been having trouble sleeping at night. Having severe lymphedema, having to wear a neck brace.    BP (!) 175/78 (BP Location: Left Arm, Patient Position: Sitting, Cuff Size: Normal)   Pulse (!) 56   Temp (!) 97.4 F (36.3 C)   Resp 20   Ht 5\' 10"  (1.778 m)   Wt 175 lb 12.8 oz (79.7 kg)   SpO2 100%   BMI 25.22 kg/m       ALLERGIES:  is allergic to norgesic forte [orphenadrine-aspirin -caffeine] and latex.  Meds: Current Outpatient Medications  Medication Sig Dispense Refill   aspirin  EC 81 MG tablet Take 81 mg by mouth daily. Swallow whole.     atorvastatin  (LIPITOR) 20 MG tablet Take 20 mg by mouth daily.     carvedilol  (COREG ) 25 MG tablet Take 25 mg by mouth 2 (two) times daily with a meal.     cetirizine (ZYRTEC) 10 MG tablet Take 10 mg by mouth every evening.     chlorhexidine  (PERIDEX ) 0.12 % solution Use as directed 10 mLs in the mouth or throat at bedtime.     chlorthalidone  (HYGROTON ) 25 MG tablet Take 1 tablet (25 mg total) by mouth daily. 90 tablet 3   HYDROcodone -acetaminophen  (HYCET) 7.5-325  mg/15 ml solution Take 10 mL up to TID as needed for pain - not to exceed 30mL daily. (Patient taking differently: Take 10 mLs by mouth every 4 (four) hours as needed for moderate pain (pain score 4-6).) 600 mL 0   LORazepam  (ATIVAN ) 0.5 MG tablet Take 0.5 mg by mouth every 8 (eight) hours as needed for anxiety.     meloxicam  (MOBIC ) 15 MG tablet Take 15 mg by mouth daily.     Multiple Vitamin (MULTIVITAMIN PO) Take 1 tablet by mouth daily.     ondansetron  (ZOFRAN ) 8 MG tablet Take 8 mg by mouth every 8 (eight) hours as needed for nausea or vomiting.      OVER THE COUNTER MEDICATION Take 30 mLs by mouth every 6 (six) hours as needed (Pain). PainQuil     pantoprazole (PROTONIX) 40 MG tablet Take 40 mg by mouth daily.     potassium chloride  (MICRO-K ) 10 MEQ CR capsule Take 10 mEq by mouth 2 (two) times daily.     sodium fluoride (FLUORISHIELD) 1.1 % GEL dental gel Place 1 Application onto teeth at bedtime.     tadalafil (CIALIS) 20 MG tablet Take 20 mg by mouth daily as needed for erectile dysfunction.     telmisartan (MICARDIS) 80 MG tablet Take 80 mg by mouth daily.     triamcinolone cream (KENALOG) 0.1 % Apply 1 application topically 3 (three) times daily as needed for itching (rash).     TRULICITY 0.75 MG/0.5ML SOAJ Inject 0.75 mg into the skin once a week.     No current facility-administered medications for this encounter.    Physical Findings:  The patient is in no acute distress. Patient is alert and oriented. Wt Readings from Last 3 Encounters:  05/24/23 175 lb 12.8 oz (79.7 kg)  04/05/23 188 lb 15 oz (85.7 kg)  03/27/23 189 lb (85.7 kg)    height is 5\' 10"  (1.778 m) and weight is 175 lb 12.8 oz (79.7 kg). His temperature is 97.4 F (36.3 C) (abnormal). His blood pressure is 175/78 (abnormal) and his pulse is 56 (abnormal). His respiration is 20 and oxygen saturation is 100%. .  General: Alert and oriented, in no acute distress HEENT: Head is normocephalic. Extraocular movements are intact. Oropharynx clear and mouth is dry. No concerning lesions. Tympanic membranes clear bilaterally Neck: Neck is notable for no palpable masses.  Mild lymphedema. Skin: Skin in treatment fields shows satisfactory healing over neck. Chest: Normal respiratory effort.  Clear to auscultation early Heart is regular in rate and rhythm with a soft systolic murmur.  Patient notified so he can talk to his cardiologist about this Abdomen: Soft, nontender, nondistended, with no rigidity or guarding.  No feeding tube. Extremities: He is wearing lower  extremity compression socks with no obvious edema Lymphatics: see Neck Exam Psychiatric: Judgment and insight are intact. Affect is appropriate. MSK: neck is flexed/ head tilted forward   Lab Findings: Lab Results  Component Value Date   WBC 7.7 05/16/2023   HGB 10.8 (L) 05/16/2023   HCT 31.7 (L) 05/16/2023   MCV 92.2 05/16/2023   PLT 186 05/16/2023    Lab Results  Component Value Date   TSH 0.504 04/05/2023   CMP     Component Value Date/Time   NA 128 (L) 05/24/2023 1033   NA 129 (L) 02/27/2023 0809   K 3.3 (L) 05/24/2023 1033   CL 90 (L) 05/24/2023 1033   CO2 30 05/24/2023 1033   GLUCOSE 119 (H)  05/24/2023 1033   BUN 15 05/24/2023 1033   BUN 43 (H) 02/27/2023 0809   CREATININE 1.15 05/24/2023 1033   CALCIUM  9.9 05/24/2023 1033   PROT 6.4 (L) 05/16/2023 1455   ALBUMIN 4.2 05/16/2023 1455   AST 18 05/16/2023 1455   ALT 13 05/16/2023 1455   ALKPHOS 51 05/16/2023 1455   BILITOT 0.6 05/16/2023 1455   EGFR 69 02/27/2023 0809   GFRNONAA >60 05/24/2023 1033    Radiographic Findings: CT ABDOMEN PELVIS W CONTRAST Result Date: 05/16/2023 CLINICAL DATA:  Inguinal hernia. EXAM: CT ABDOMEN AND PELVIS WITH CONTRAST TECHNIQUE: Multidetector CT imaging of the abdomen and pelvis was performed using the standard protocol following bolus administration of intravenous contrast. RADIATION DOSE REDUCTION: This exam was performed according to the departmental dose-optimization program which includes automated exposure control, adjustment of the mA and/or kV according to patient size and/or use of iterative reconstruction technique. CONTRAST:  OMNIPAQUE  IOHEXOL  300 MG/ML  SOLN COMPARISON:  03/02/2010. FINDINGS: Lower chest: No acute abnormality. No pleural or pericardial effusions. There is mild bilateral gynecomastia. Hepatobiliary: No focal liver abnormality is seen. No gallstones, gallbladder wall thickening, or biliary dilatation. Pancreas: Unremarkable. No pancreatic ductal dilatation  or surrounding inflammatory changes. Spleen: Normal in size without focal abnormality. Adrenals/Urinary Tract: Adrenal glands are unremarkable. Kidneys are normal, without renal calculi, focal lesion, or hydronephrosis. Bladder is unremarkable. Stomach/Bowel: Stomach is within normal limits. Appendix appears normal. No evidence of bowel wall thickening, distention, or inflammatory changes. Vascular/Lymphatic: Aortic atherosclerosis. No enlarged abdominal or pelvic lymph nodes. Reproductive: Prostate is unremarkable. Other: Small bilateral inguinal hernias containing fat. No obstruction or herniated bowel. Musculoskeletal: L5 compression deformity unchanged since 2012. IMPRESSION: 1. Small bilateral inguinal hernias containing fat. 2. Gynecomastia. 3. Aortic atherosclerosis (ICD10-I70.0). Electronically Signed   By: Sydell Eva M.D.   On: 05/16/2023 18:40    Impression/Plan:  Stage IV (pT2, pN2a, cM0) poorly differentiated squamous cell carcinoma; s/p CO2 laser excision of the hypopharyngeal mass and bilateral neck dissection with one positive LN and adjuvant radiation treatment  1) Head and Neck Cancer Status: Patient continues to have lingering side effects from his cancer treatments. Fortunately his recent laryngoscope (05/02/2023) and physical exam indicate that he is in remission.  After celebrating this we talked about his symptoms:  A) lymphedema: Continue to follow with physical therapy and use compression garment/  Continue PT for neck weakness as well.  Wears a brace.  B) severe neck pain that radiates to the fight shoulder: Given the timing of this issue (started before RT), it is likely not a side effect from the radiation treatment. Patient continues to use Hycet BID for pain control. To decrease the amount of people managing this, Dr. Caldwell Castles will be prescribe his pain medication from here on out.  Pt and wife reminded of importance to keep narcotics under one prescriber.  Imaging and exam  are fortunately reassuring   2) Nutritional Status: slight decrease noted PEG tube: None Wt Readings from Last 3 Encounters:  05/24/23 175 lb 12.8 oz (79.7 kg)  04/05/23 188 lb 15 oz (85.7 kg)  03/27/23 189 lb (85.7 kg)     2) Risk Factors: The patient has been educated about risk factors including alcohol and tobacco abuse; they understand that avoidance of alcohol and tobacco is important to prevent recurrences as well as other cancers  3) Swallowing: continue speech-language pathology appointments  4) Dental: Encouraged to continue regular followup with dentistry, and dental hygiene including fluoride treatments.  5) Thyroid  function: Check annually.  Lab Results  Component Value Date   TSH 0.504 04/05/2023   6) Bilateral hearing loss: We have reviewed this patient's radiation records and this is unlikely a side effect from his radiation treatment given low doses to cochlea bilaterally. He is interested in seeing an audiologist closer to home. Referral to Audiology in [place] made today.   7) Patient will follow-up with Dr. Caldwell Castles on 08/01/2023. We will order CT of the neck and chest with contrast to be completed in October and see the patient in clinic to review the results. He was encouraged to call with any questions or concerns in the meantime.    On date of service, in total, we spent 40 minutes on this encounter. Patient was seen in person. _____________________________________    Julio Ohm, PA-C   Colie Dawes, MD    [facility]  Radiation Oncology Direct Dial: [phone]  Fax: [phone] [email or web address]

## 2023-05-25 ENCOUNTER — Telehealth: Payer: Self-pay | Admitting: Radiation Oncology

## 2023-05-25 NOTE — Telephone Encounter (Signed)
 5/15 @ 2:30 pm Follow up call to [facility] - ENT; spoke to ref coord.  Referral received/will call back once patient is scheduled with date/time.

## 2023-06-02 ENCOUNTER — Telehealth: Payer: Self-pay | Admitting: Radiation Oncology

## 2023-06-02 NOTE — Telephone Encounter (Signed)
 5/23 Follow up call to [facility] -ENT/Audiology-[place], patient has not return our call, but is being seen in [place] by Dr. Caldwell Castles and has up coming appt on 7/22 -per Louanna Rouse, Referral Coordinator at [facility].

## 2023-06-06 NOTE — Pre-Procedure Instructions (Signed)
 Surgical Instructions   Your procedure is scheduled on Thursday, June 5th. Report to [facility] Main Entrance "A" at 05:30 A.M., then check in with the Admitting office. Any questions or running late day of surgery: call [phone]  Questions prior to your surgery date: call [phone], Monday-Friday, 8am-4pm. If you experience any cold or flu symptoms such as cough, fever, chills, shortness of breath, etc. between now and your scheduled surgery, please notify us  at the above number.     Remember:  Do not eat after midnight the night before your surgery   You may drink clear liquids until 04:30 AM the morning of your surgery.   Clear liquids allowed are: Water, Non-Citrus Juices (without pulp), Carbonated Beverages, Clear Tea (no milk, honey, etc.), Black Coffee Only (NO MILK, CREAM OR POWDERED CREAMER of any kind), and Gatorade.  Patient Instructions  The night before surgery:  No food after midnight. ONLY clear liquids after midnight   The day of surgery (if you have diabetes): Drink ONE (1) 12 oz G2 given to you in your pre admission testing appointment by 04:30 AM the morning of surgery. Drink in one sitting. Do not sip.  This drink was given to you during your hospital  pre-op appointment visit.  Nothing else to drink after completing the  12 oz bottle of G2.         If you have questions, please contact your surgeon's office.    Take these medicines the morning of surgery with A SIP OF WATER  atorvastatin  (LIPITOR)  carvedilol  (COREG )  pantoprazole (PROTONIX)     May take these medicines IF NEEDED: HYDROcodone -acetaminophen  (HYCET)  ondansetron  (ZOFRAN )  oxymetazoline  (AFRIN)  traMADol (ULTRAM)    Follow your surgeon's instructions on when to stop Aspirin .  If no instructions were given by your surgeon then you will need to call the office to get those instructions.  This includes meloxicam  (MOBIC ).  One week prior to surgery, STOP taking any Aleve,  Naproxen, Ibuprofen, Motrin, Advil, Goody's, BC's, all herbal medications, fish oil, and non-prescription vitamins.  STOP taking TRULICITY  7 days prior to surgery. Last dose on or before 5/29.               Do NOT Smoke (Tobacco/Vaping) for 24 hours prior to your procedure.  If you use a CPAP at night, you may bring your mask/headgear for your overnight stay.   You will be asked to remove any contacts, glasses, piercing's, hearing aid's, dentures/partials prior to surgery. Please bring cases for these items if needed.    Patients discharged the day of surgery will not be allowed to drive home, and someone needs to stay with them for 24 hours.  SURGICAL WAITING ROOM VISITATION Patients may have no more than 2 support people in the waiting area - these visitors may rotate.   Pre-op nurse will coordinate an appropriate time for 1 ADULT support person, who may not rotate, to accompany patient in pre-op.  Children under the age of 53 must have an adult with them who is not the patient and must remain in the main waiting area with an adult.  If the patient needs to stay at the hospital during part of their recovery, the visitor guidelines for inpatient rooms apply.  Please refer to the [facility] website for the visitor guidelines for any additional information.   If you received a COVID test during your pre-op visit  it is requested that you wear a mask when out  in public, stay away from anyone that may not be feeling well and notify your surgeon if you develop symptoms. If you have been in contact with anyone that has tested positive in the last 10 days please notify you surgeon.      Pre-operative CHG Bathing Instructions   You can play a key role in reducing the risk of infection after surgery. Your skin needs to be as free of germs as possible. You can reduce the number of germs on your skin by washing with CHG (chlorhexidine  gluconate) soap before surgery. CHG is an antiseptic soap  that kills germs and continues to kill germs even after washing.   DO NOT use if you have an allergy to chlorhexidine /CHG or antibacterial soaps. If your skin becomes reddened or irritated, stop using the CHG and notify one of our RNs at [phone].              TAKE A SHOWER THE NIGHT BEFORE SURGERY AND THE DAY OF SURGERY    Please keep in mind the following:  DO NOT shave, including legs and underarms, 48 hours prior to surgery.   You may shave your face before/day of surgery.  Place clean sheets on your bed the night before surgery Use a clean washcloth (not used since being washed) for each shower. DO NOT sleep with pet's night before surgery.  CHG Shower Instructions:  Wash your face and private area with normal soap. If you choose to wash your hair, wash first with your normal shampoo.  After you use shampoo/soap, rinse your hair and body thoroughly to remove shampoo/soap residue.  Turn the water OFF and apply half the bottle of CHG soap to a CLEAN washcloth.  Apply CHG soap ONLY FROM YOUR NECK DOWN TO YOUR TOES (washing for 3-5 minutes)  DO NOT use CHG soap on face, private areas, open wounds, or sores.  Pay special attention to the area where your surgery is being performed.  If you are having back surgery, having someone wash your back for you may be helpful. Wait 2 minutes after CHG soap is applied, then you may rinse off the CHG soap.  Pat dry with a clean towel  Put on clean pajamas    Additional instructions for the day of surgery: DO NOT APPLY any lotions, deodorants, cologne, or perfumes.   Do not wear jewelry or makeup Do not wear nail polish, gel polish, artificial nails, or any other type of covering on natural nails (fingers and toes) Do not bring valuables to the hospital. [facility] is not responsible for valuables/personal belongings. Put on clean/comfortable clothes.  Please brush your teeth.  Ask your nurse before applying any prescription medications to the  skin.

## 2023-06-07 ENCOUNTER — Encounter (HOSPITAL_COMMUNITY): Payer: Self-pay

## 2023-06-07 ENCOUNTER — Encounter (HOSPITAL_COMMUNITY)
Admission: RE | Admit: 2023-06-07 | Discharge: 2023-06-07 | Disposition: A | Discharge status: Home / Self Care | Source: Ambulatory Visit | Attending: General Surgery | Admitting: General Surgery

## 2023-06-07 ENCOUNTER — Other Ambulatory Visit: Payer: Self-pay

## 2023-06-07 VITALS — BP 175/84 | Temp 98.1°F | Resp 18 | Ht 70.0 in | Wt 176.4 lb

## 2023-06-07 DIAGNOSIS — Z85818 Personal history of malignant neoplasm of other sites of lip, oral cavity, and pharynx: Secondary | ICD-10-CM | POA: Insufficient documentation

## 2023-06-07 DIAGNOSIS — R931 Abnormal findings on diagnostic imaging of heart and coronary circulation: Secondary | ICD-10-CM | POA: Insufficient documentation

## 2023-06-07 DIAGNOSIS — Z7982 Long term (current) use of aspirin: Secondary | ICD-10-CM | POA: Insufficient documentation

## 2023-06-07 DIAGNOSIS — Z79899 Other long term (current) drug therapy: Secondary | ICD-10-CM | POA: Insufficient documentation

## 2023-06-07 DIAGNOSIS — E871 Hypo-osmolality and hyponatremia: Secondary | ICD-10-CM | POA: Diagnosis not present

## 2023-06-07 DIAGNOSIS — E1122 Type 2 diabetes mellitus with diabetic chronic kidney disease: Secondary | ICD-10-CM | POA: Diagnosis not present

## 2023-06-07 DIAGNOSIS — E785 Hyperlipidemia, unspecified: Secondary | ICD-10-CM | POA: Diagnosis not present

## 2023-06-07 DIAGNOSIS — I130 Hypertensive heart and chronic kidney disease with heart failure and stage 1 through stage 4 chronic kidney disease, or unspecified chronic kidney disease: Secondary | ICD-10-CM | POA: Diagnosis not present

## 2023-06-07 DIAGNOSIS — Z01812 Encounter for preprocedural laboratory examination: Secondary | ICD-10-CM | POA: Diagnosis present

## 2023-06-07 DIAGNOSIS — Z01818 Encounter for other preprocedural examination: Secondary | ICD-10-CM

## 2023-06-07 DIAGNOSIS — Z923 Personal history of irradiation: Secondary | ICD-10-CM | POA: Diagnosis not present

## 2023-06-07 DIAGNOSIS — K219 Gastro-esophageal reflux disease without esophagitis: Secondary | ICD-10-CM | POA: Insufficient documentation

## 2023-06-07 DIAGNOSIS — I5030 Unspecified diastolic (congestive) heart failure: Secondary | ICD-10-CM | POA: Diagnosis not present

## 2023-06-07 DIAGNOSIS — I1A0 Resistant hypertension: Secondary | ICD-10-CM | POA: Insufficient documentation

## 2023-06-07 DIAGNOSIS — E118 Type 2 diabetes mellitus with unspecified complications: Secondary | ICD-10-CM

## 2023-06-07 DIAGNOSIS — N182 Chronic kidney disease, stage 2 (mild): Secondary | ICD-10-CM | POA: Diagnosis not present

## 2023-06-07 DIAGNOSIS — D631 Anemia in chronic kidney disease: Secondary | ICD-10-CM | POA: Diagnosis not present

## 2023-06-07 HISTORY — DX: Chronic kidney disease, stage 2 (mild): N18.2

## 2023-06-07 HISTORY — DX: Squamous cell carcinoma of skin of scalp and neck: C44.42

## 2023-06-07 HISTORY — DX: Lymphedema, not elsewhere classified: I89.0

## 2023-06-07 HISTORY — DX: Atherosclerosis of aorta: I70.0

## 2023-06-07 HISTORY — DX: Unspecified osteoarthritis, unspecified site: M19.90

## 2023-06-07 HISTORY — DX: Other diseases of larynx: J38.7

## 2023-06-07 LAB — CBC
HCT: 31.4 % — ABNORMAL LOW (ref 39.0–52.0)
Hemoglobin: 11 g/dL — ABNORMAL LOW (ref 13.0–17.0)
MCH: 32.4 pg (ref 26.0–34.0)
MCHC: 35 g/dL (ref 30.0–36.0)
MCV: 92.4 fL (ref 80.0–100.0)
Platelets: 207 10*3/uL (ref 150–400)
RBC: 3.4 MIL/uL — ABNORMAL LOW (ref 4.22–5.81)
RDW: 12.9 % (ref 11.5–15.5)
WBC: 6.1 10*3/uL (ref 4.0–10.5)
nRBC: 0 % (ref 0.0–0.2)

## 2023-06-07 LAB — BASIC METABOLIC PANEL WITH GFR
Anion gap: 8 (ref 5–15)
BUN: 11 mg/dL (ref 8–23)
CO2: 26 mmol/L (ref 22–32)
Calcium: 9.2 mg/dL (ref 8.9–10.3)
Chloride: 97 mmol/L — ABNORMAL LOW (ref 98–111)
Creatinine, Ser: 0.95 mg/dL (ref 0.61–1.24)
GFR, Estimated: >60 mL/min (ref >60)
Glucose, Bld: 101 mg/dL — ABNORMAL HIGH (ref 70–99)
Potassium: 3.9 mmol/L (ref 3.5–5.1)
Sodium: 131 mmol/L — ABNORMAL LOW (ref 135–145)

## 2023-06-07 LAB — HEMOGLOBIN A1C
Hgb A1c MFr Bld: 5.2 % (ref 4.8–5.6)
Mean Plasma Glucose: 102.54 mg/dL

## 2023-06-07 LAB — GLUCOSE, CAPILLARY: Glucose-Capillary: 83 mg/dL (ref 70–99)

## 2023-06-07 NOTE — Progress Notes (Signed)
 PCP - Terrance Ferretti, PA-C Cardiologist - Dr. [name] Oncologist- Dr. Colie Dawes  PPM/ICD - denies   Chest x-ray - 03/07/10 (pt states he had one more recently with his ENT doctor but I can't find it) EKG - 04/05/23 Stress Test - denies ECHO - 02/09/23 Cardiac Cath - denies  Sleep Study - denies   Fasting Blood Sugar - 90-126 Checks Blood Sugar continuously (sensor on left arm)  Last dose of GLP1 agonist-  06/01/23 GLP1 instructions: Pt knows not to take any further doses prior to surgery  Blood Thinner Instructions: n/a Aspirin  Instructions: Hold 5-7 days prior to surgery. Last dose 5/29.  ERAS Protcol - clears until 0430 PRE-SURGERY G2- given  COVID TEST- n/a   Anesthesia review: yes, cardiac hx  Patient denies shortness of breath, fever, cough and chest pain at PAT appointment   All instructions explained to the patient, with a verbal understanding of the material. Patient agrees to go over the instructions while at home for a better understanding.  The opportunity to ask questions was provided.

## 2023-06-08 NOTE — Progress Notes (Signed)
 Anesthesia Chart Review:  68 year old male follows with cardiology for history of resistant hypertension with hypertensive heart disease/HFpEF, elevated coronary calcium  score, HLD.  Echo 01/2023 showed EF 55 to 60%, grade 2 DD, normal RV function, no significant valvular abnormalities.  Clearance per telephone encounter by Randall Ar, NP on 05/22/2023, "Chart reviewed as part of pre-operative protocol coverage. Randall [name] was last seen on 03/27/2023 by Dr. Addie [name].  Per Dr. Addie [name], "Randall [name]'s perioperative risk of a major cardiac event is 0.4% according to the Revised Cardiac Risk Index (RCRI).  Therefore, he is at low risk for perioperative complications.   His functional capacity is excellent at 8.97 METs according to the Duke Activity Status Index (DASI). Recommendations: According to ACC/AHA guidelines, no further cardiovascular testing needed.  The patient may proceed to surgery at acceptable risk.   Antiplatelet and/or Anticoagulation Recommendations: Aspirin  can be held for 5-7 days prior to his surgery.  Please resume Aspirin  post operatively when it is felt to be safe from a bleeding standpoint. 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  He underwent postop radiation therapy and he subsequently had several complications including severe lymphedema, severe neck pain, swallowing difficulty.  Last seen by ENT Dr. Caldwell [name] on 05/02/2023 and flexible laryngoscopy at that time showed, "The posterior soft palate, uvula, tongue base and vallecula were visualized and appeared healthy without mucosal masses or lesions. The surgical site at the right aryepiglottic fold and medial wall of the hypopharynx has scar tissue and edema, no worrisome masses or lesions . Vocal folds are mobile and symmetric . The epiglottis,  supraglottis, glottis were visualized and appeared healthy without mucosal masses or lesions. The scope was withdrawn from the nose. He tolerated the procedure well."  Other pertinent history includes PONV, GERD on PPI, CKD 2, non-insulin -dependent DM2 (A1c 5.2 on 05/30/2023).  Preop labs reviewed, mild hyponatremia sodium 131, mild anemia hemoglobin 7.0, otherwise unremarkable.  EKG 04/11/2023: NSR.  Rate 60.  Right bundle branch block.  Left anterior fascicular block.  Minimal voltage criteria for LVH, may be normal variant.  Septal infarct, age undetermined.  TTE 02/09/2023: 1. Left ventricular ejection fraction, by estimation, is 55 to 60%. Left  ventricular ejection fraction by 3D volume is 57 %. The left ventricle has  normal function. Left ventricular endocardial border not optimally defined  to evaluate regional wall  motion. Left ventricular diastolic parameters are consistent with Grade II  diastolic dysfunction (pseudonormalization). The average left ventricular  global longitudinal strain is -17.1 %. The global longitudinal strain is  abnormal.   2. Right ventricular systolic function is normal. The right ventricular  size is moderately enlarged. Tricuspid regurgitation signal is inadequate  for assessing PA pressure.   3. Left atrial size was severely dilated.   4. The mitral valve is normal in structure. No evidence of mitral valve  regurgitation. No evidence of mitral stenosis.   5. The aortic valve is tricuspid. There is mild calcification of the  aortic valve. There is mild thickening of the aortic valve. Aortic valve  regurgitation is not visualized. No aortic stenosis is present.   6. The inferior vena cava is dilated in size with >50% respiratory  variability, suggesting right atrial pressure of 8 mmHg.   Comparison(s): EF 45%, global hypokinesis; LV function has improved.      Randall Costain,  PA-C [facility] Short Stay Center/Anesthesiology Phone [phone] 06/08/2023  3:42 PM

## 2023-06-08 NOTE — Anesthesia Preprocedure Evaluation (Addendum)
 Anesthesia Evaluation  Patient identified by MRN, date of birth, ID band Patient awake    Reviewed: Allergy & Precautions, NPO status , Patient's Chart, lab work & pertinent test results, reviewed documented beta blocker date and time   History of Anesthesia Complications (+) PONV and history of anesthetic complications (recent GETA with chipped front tooth, will use Glidescope)  Airway Mallampati: III  TM Distance: <3 FB Neck ROM: Full    Dental no notable dental hx.    Pulmonary neg pulmonary ROS   Pulmonary exam normal        Cardiovascular hypertension, Pt. on medications and Pt. on home beta blockers + CAD and +CHF  Normal cardiovascular exam+ dysrhythmias      Neuro/Psych negative neurological ROS     GI/Hepatic Neg liver ROS,GERD  Medicated and Controlled,,  Endo/Other  diabetes (on Trulicity), Type 2    Renal/GU Renal InsufficiencyRenal diseaseNa 128     Musculoskeletal  (+) Arthritis ,    Abdominal   Peds  Hematology  (+) Blood dyscrasia (Hgb 10.3), anemia   Anesthesia Other Findings BILATERAL INGUINAL HERNIA, hx of laryngeal ca  Reproductive/Obstetrics                             Anesthesia Physical Anesthesia Plan  ASA: 2  Anesthesia Plan: General   Post-op Pain Management: Tylenol  PO (pre-op)*   Induction: Intravenous  PONV Risk Score and Plan: 3 and Treatment may vary due to age or medical condition, Ondansetron , Dexamethasone , Midazolam  and Propofol  infusion  Airway Management Planned: Oral ETT  Additional Equipment: None  Intra-op Plan:   Post-operative Plan: Extubation in OR  Informed Consent: I have reviewed the patients History and Physical, chart, labs and discussed the procedure including the risks, benefits and alternatives for the proposed anesthesia with the patient or authorized representative who has indicated his/her understanding and acceptance.      Dental advisory given  Plan Discussed with: CRNA  Anesthesia Plan Comments: (PAT note by Randall Costain, PA-C:  68 year old male follows with cardiology for history of resistant hypertension with hypertensive heart disease/HFpEF, elevated coronary calcium  score, HLD.  Echo 01/2023 showed EF 55 to 60%, grade 2 DD, normal RV function, no significant valvular abnormalities.  Clearance per telephone encounter by Randall Ar, NP on 05/22/2023, "Chart reviewed as part of pre-operative protocol coverage. Randall [name] was last seen on 03/27/2023 by Dr. Addie [name].  Per Dr. Addie [name], "[name]'s perioperative risk of a major cardiac event is 0.4% according to the Revised Cardiac Risk Index (RCRI).  Therefore, he is at low risk for perioperative complications.   His functional capacity is excellent at 8.97 METs according to the Duke Activity Status Index (DASI). Recommendations: According to ACC/AHA guidelines, no further cardiovascular testing needed.  The patient may proceed to surgery at acceptable risk.   Antiplatelet and/or Anticoagulation Recommendations: Aspirin  can be held for 5-7 days prior to his surgery.  Please resume Aspirin  post operatively when it is felt to be safe from a bleeding standpoint. 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  He underwent postop radiation therapy and he subsequently had several complications including severe lymphedema, severe neck pain, swallowing difficulty.  Last seen by ENT Dr. Caldwell [name] on 05/02/2023 and flexible laryngoscopy at that time showed, "The posterior soft palate,  uvula, tongue base and vallecula were visualized and appeared healthy without mucosal masses or lesions. The surgical site at the right aryepiglottic fold and medial wall of the hypopharynx has scar tissue and edema, no  worrisome masses or lesions . Vocal folds are mobile and symmetric . The epiglottis, supraglottis, glottis were visualized and appeared healthy without mucosal masses or lesions. The scope was withdrawn from the nose. He tolerated the procedure well."  Other pertinent history includes PONV, GERD on PPI, CKD 2, non-insulin -dependent DM2 (A1c 5.2 on 05/30/2023).  Preop labs reviewed, mild hyponatremia sodium 131, mild anemia hemoglobin 7.0, otherwise unremarkable.  EKG 04/11/2023: NSR.  Rate 60.  Right bundle branch block.  Left anterior fascicular block.  Minimal voltage criteria for LVH, may be normal variant.  Septal infarct, age undetermined.  TTE 02/09/2023: 1. Left ventricular ejection fraction, by estimation, is 55 to 60%. Left  ventricular ejection fraction by 3D volume is 57 %. The left ventricle has  normal function. Left ventricular endocardial border not optimally defined  to evaluate regional wall  motion. Left ventricular diastolic parameters are consistent with Grade II  diastolic dysfunction (pseudonormalization). The average left ventricular  global longitudinal strain is -17.1 %. The global longitudinal strain is  abnormal.  2. Right ventricular systolic function is normal. The right ventricular  size is moderately enlarged. Tricuspid regurgitation signal is inadequate  for assessing PA pressure.  3. Left atrial size was severely dilated.  4. The mitral valve is normal in structure. No evidence of mitral valve  regurgitation. No evidence of mitral stenosis.  5. The aortic valve is tricuspid. There is mild calcification of the  aortic valve. There is mild thickening of the aortic valve. Aortic valve  regurgitation is not visualized. No aortic stenosis is present.  6. The inferior vena cava is dilated in size with >50% respiratory  variability, suggesting right atrial pressure of 8 mmHg.   Comparison(s): EF 45%, global hypokinesis; LV function has improved.    )         Anesthesia Quick Evaluation

## 2023-06-10 ENCOUNTER — Other Ambulatory Visit: Payer: Self-pay

## 2023-06-10 ENCOUNTER — Emergency Department (HOSPITAL_BASED_OUTPATIENT_CLINIC_OR_DEPARTMENT_OTHER): Admitting: Radiology

## 2023-06-10 ENCOUNTER — Observation Stay (HOSPITAL_BASED_OUTPATIENT_CLINIC_OR_DEPARTMENT_OTHER)
Admission: EM | Admit: 2023-06-10 | Discharge: 2023-06-12 | DRG: 287 | Disposition: A | Discharge status: Home / Self Care | Attending: Obstetrics and Gynecology | Admitting: Obstetrics and Gynecology

## 2023-06-10 ENCOUNTER — Encounter (HOSPITAL_BASED_OUTPATIENT_CLINIC_OR_DEPARTMENT_OTHER): Payer: Self-pay | Admitting: Emergency Medicine

## 2023-06-10 ENCOUNTER — Emergency Department (HOSPITAL_BASED_OUTPATIENT_CLINIC_OR_DEPARTMENT_OTHER)

## 2023-06-10 DIAGNOSIS — E871 Hypo-osmolality and hyponatremia: Secondary | ICD-10-CM | POA: Diagnosis present

## 2023-06-10 DIAGNOSIS — I130 Hypertensive heart and chronic kidney disease with heart failure and stage 1 through stage 4 chronic kidney disease, or unspecified chronic kidney disease: Secondary | ICD-10-CM | POA: Diagnosis present

## 2023-06-10 DIAGNOSIS — I452 Bifascicular block: Secondary | ICD-10-CM | POA: Diagnosis present

## 2023-06-10 DIAGNOSIS — R079 Chest pain, unspecified: Secondary | ICD-10-CM | POA: Diagnosis not present

## 2023-06-10 DIAGNOSIS — K219 Gastro-esophageal reflux disease without esophagitis: Secondary | ICD-10-CM | POA: Diagnosis present

## 2023-06-10 DIAGNOSIS — I1A0 Resistant hypertension: Secondary | ICD-10-CM | POA: Diagnosis present

## 2023-06-10 DIAGNOSIS — E785 Hyperlipidemia, unspecified: Secondary | ICD-10-CM | POA: Diagnosis present

## 2023-06-10 DIAGNOSIS — E876 Hypokalemia: Secondary | ICD-10-CM | POA: Diagnosis present

## 2023-06-10 DIAGNOSIS — Z791 Long term (current) use of non-steroidal anti-inflammatories (NSAID): Secondary | ICD-10-CM

## 2023-06-10 DIAGNOSIS — N182 Chronic kidney disease, stage 2 (mild): Secondary | ICD-10-CM | POA: Diagnosis present

## 2023-06-10 DIAGNOSIS — E119 Type 2 diabetes mellitus without complications: Secondary | ICD-10-CM | POA: Diagnosis present

## 2023-06-10 DIAGNOSIS — I10 Essential (primary) hypertension: Secondary | ICD-10-CM | POA: Diagnosis not present

## 2023-06-10 DIAGNOSIS — T502X5A Adverse effect of carbonic-anhydrase inhibitors, benzothiadiazides and other diuretics, initial encounter: Secondary | ICD-10-CM | POA: Diagnosis present

## 2023-06-10 DIAGNOSIS — Z7985 Long-term (current) use of injectable non-insulin antidiabetic drugs: Secondary | ICD-10-CM

## 2023-06-10 DIAGNOSIS — I249 Acute ischemic heart disease, unspecified: Secondary | ICD-10-CM | POA: Diagnosis present

## 2023-06-10 DIAGNOSIS — I25110 Atherosclerotic heart disease of native coronary artery with unstable angina pectoris: Principal | ICD-10-CM | POA: Diagnosis present

## 2023-06-10 DIAGNOSIS — K4020 Bilateral inguinal hernia, without obstruction or gangrene, not specified as recurrent: Secondary | ICD-10-CM | POA: Diagnosis present

## 2023-06-10 DIAGNOSIS — Z91040 Latex allergy status: Secondary | ICD-10-CM

## 2023-06-10 DIAGNOSIS — Z7982 Long term (current) use of aspirin: Secondary | ICD-10-CM

## 2023-06-10 DIAGNOSIS — E1122 Type 2 diabetes mellitus with diabetic chronic kidney disease: Secondary | ICD-10-CM | POA: Diagnosis present

## 2023-06-10 DIAGNOSIS — E118 Type 2 diabetes mellitus with unspecified complications: Secondary | ICD-10-CM | POA: Diagnosis present

## 2023-06-10 DIAGNOSIS — Z8521 Personal history of malignant neoplasm of larynx: Secondary | ICD-10-CM

## 2023-06-10 DIAGNOSIS — C12 Malignant neoplasm of pyriform sinus: Secondary | ICD-10-CM | POA: Diagnosis present

## 2023-06-10 DIAGNOSIS — I5032 Chronic diastolic (congestive) heart failure: Secondary | ICD-10-CM | POA: Diagnosis present

## 2023-06-10 DIAGNOSIS — Z923 Personal history of irradiation: Secondary | ICD-10-CM

## 2023-06-10 LAB — BASIC METABOLIC PANEL WITH GFR
Anion gap: 13 (ref 5–15)
BUN: 14 mg/dL (ref 8–23)
CO2: 25 mmol/L (ref 22–32)
Calcium: 10.1 mg/dL (ref 8.9–10.3)
Chloride: 96 mmol/L — ABNORMAL LOW (ref 98–111)
Creatinine, Ser: 0.91 mg/dL (ref 0.61–1.24)
GFR, Estimated: >60 mL/min (ref >60)
Glucose, Bld: 137 mg/dL — ABNORMAL HIGH (ref 70–99)
Potassium: 3.8 mmol/L (ref 3.5–5.1)
Sodium: 133 mmol/L — ABNORMAL LOW (ref 135–145)

## 2023-06-10 LAB — CBC
HCT: 35.9 % — ABNORMAL LOW (ref 39.0–52.0)
Hemoglobin: 12.2 g/dL — ABNORMAL LOW (ref 13.0–17.0)
MCH: 31.7 pg (ref 26.0–34.0)
MCHC: 34 g/dL (ref 30.0–36.0)
MCV: 93.2 fL (ref 80.0–100.0)
Platelets: 212 10*3/uL (ref 150–400)
RBC: 3.85 MIL/uL — ABNORMAL LOW (ref 4.22–5.81)
RDW: 13.1 % (ref 11.5–15.5)
WBC: 5.7 10*3/uL (ref 4.0–10.5)
nRBC: 0 % (ref 0.0–0.2)

## 2023-06-10 LAB — D-DIMER, QUANTITATIVE: D-Dimer, Quant: 0.3 ug{FEU}/mL (ref 0.00–0.50)

## 2023-06-10 LAB — TROPONIN T, HIGH SENSITIVITY
Troponin T High Sensitivity: 23 ng/L — ABNORMAL HIGH
Troponin T High Sensitivity: 21 ng/L — ABNORMAL HIGH (ref <19)

## 2023-06-10 MED ORDER — ONDANSETRON HCL 4 MG PO TABS
4.0000 mg | ORAL_TABLET | Freq: Four times a day (QID) | ORAL | Status: DC | PRN
  Administered 2023-06-11: 4 mg via ORAL
  Filled 2023-06-10: qty 1

## 2023-06-10 MED ORDER — ACETAMINOPHEN 650 MG RE SUPP: 650.0000 mg | Freq: Four times a day (QID) | RECTAL | Status: DC | PRN

## 2023-06-10 MED ORDER — OXYMETAZOLINE HCL 0.05 % NA SOLN
1.0000 | Freq: Two times a day (BID) | NASAL | Status: DC | PRN
Start: 2023-06-10 — End: 2023-06-13

## 2023-06-10 MED ORDER — PANTOPRAZOLE SODIUM 40 MG PO TBEC: 40.0000 mg | DELAYED_RELEASE_TABLET | Freq: Every day | ORAL | Status: DC

## 2023-06-10 MED ORDER — HYDROCODONE-ACETAMINOPHEN 7.5-325 MG/15ML PO SOLN
10.0000 mL | Freq: Three times a day (TID) | ORAL | Status: DC | PRN
  Filled 2023-06-10: qty 15

## 2023-06-10 MED ORDER — ADULT MULTIVITAMIN W/MINERALS CH
1.0000 | ORAL_TABLET | Freq: Every day | ORAL | Status: DC
  Administered 2023-06-11 – 2023-06-12 (×2): 1 via ORAL
  Filled 2023-06-10 (×3): qty 1

## 2023-06-10 MED ORDER — ATORVASTATIN CALCIUM 10 MG PO TABS: 20.0000 mg | ORAL_TABLET | Freq: Every day | ORAL | Status: DC

## 2023-06-10 MED ORDER — ASPIRIN 81 MG PO TBEC
81.0000 mg | DELAYED_RELEASE_TABLET | Freq: Every day | ORAL | Status: DC
  Administered 2023-06-11 – 2023-06-12 (×2): 81 mg via ORAL
  Filled 2023-06-10 (×2): qty 1

## 2023-06-10 MED ORDER — SENNOSIDES-DOCUSATE SODIUM 8.6-50 MG PO TABS: 1.0000 | ORAL_TABLET | Freq: Every evening | ORAL | Status: DC | PRN

## 2023-06-10 MED ORDER — ONDANSETRON HCL 4 MG/2ML IJ SOLN
4.0000 mg | Freq: Four times a day (QID) | INTRAMUSCULAR | Status: DC | PRN
  Administered 2023-06-11 – 2023-06-12 (×2): 4 mg via INTRAVENOUS
  Filled 2023-06-10 (×3): qty 2

## 2023-06-10 MED ORDER — MORPHINE SULFATE (PF) 4 MG/ML IV SOLN
4.0000 mg | Freq: Once | INTRAVENOUS | Status: AC
  Administered 2023-06-10: 4 mg via INTRAVENOUS
  Filled 2023-06-10: qty 1

## 2023-06-10 MED ORDER — POTASSIUM CHLORIDE CRYS ER 10 MEQ PO TBCR
10.0000 meq | EXTENDED_RELEASE_TABLET | Freq: Two times a day (BID) | ORAL | Status: DC
  Administered 2023-06-10 – 2023-06-12 (×4): 10 meq via ORAL
  Filled 2023-06-10 (×4): qty 1

## 2023-06-10 MED ORDER — ONDANSETRON HCL 4 MG/2ML IJ SOLN
4.0000 mg | Freq: Once | INTRAMUSCULAR | Status: AC
  Administered 2023-06-10: 4 mg via INTRAVENOUS
  Filled 2023-06-10: qty 2

## 2023-06-10 MED ORDER — IRBESARTAN 150 MG PO TABS
300.0000 mg | ORAL_TABLET | Freq: Every day | ORAL | Status: DC
  Administered 2023-06-11 – 2023-06-12 (×2): 300 mg via ORAL
  Filled 2023-06-10 (×2): qty 2

## 2023-06-10 MED ORDER — NAPROXEN 250 MG PO TABS
375.0000 mg | ORAL_TABLET | Freq: Three times a day (TID) | ORAL | Status: DC
  Administered 2023-06-11 – 2023-06-12 (×6): 375 mg via ORAL
  Filled 2023-06-10: qty 2
  Filled 2023-06-10: qty 1
  Filled 2023-06-10 (×7): qty 2

## 2023-06-10 MED ORDER — CARVEDILOL 25 MG PO TABS
25.0000 mg | ORAL_TABLET | Freq: Two times a day (BID) | ORAL | Status: DC
  Administered 2023-06-11 – 2023-06-12 (×4): 25 mg via ORAL
  Filled 2023-06-10 (×4): qty 1

## 2023-06-10 MED ORDER — ENOXAPARIN SODIUM 40 MG/0.4ML IJ SOSY
40.0000 mg | PREFILLED_SYRINGE | INTRAMUSCULAR | Status: DC
  Administered 2023-06-10: 40 mg via SUBCUTANEOUS
  Filled 2023-06-10: qty 0.4

## 2023-06-10 MED ORDER — KETOROLAC TROMETHAMINE 15 MG/ML IJ SOLN
15.0000 mg | Freq: Once | INTRAMUSCULAR | Status: AC
  Administered 2023-06-10: 15 mg via INTRAVENOUS
  Filled 2023-06-10: qty 1

## 2023-06-10 MED ORDER — HYDROCODONE-ACETAMINOPHEN 7.5-325 MG/15ML PO SOLN
10.0000 mL | Freq: Two times a day (BID) | ORAL | Status: DC
  Administered 2023-06-10 – 2023-06-12 (×4): 10 mL via ORAL
  Filled 2023-06-10 (×3): qty 15

## 2023-06-10 MED ORDER — PANTOPRAZOLE SODIUM 40 MG PO TBEC
40.0000 mg | DELAYED_RELEASE_TABLET | Freq: Two times a day (BID) | ORAL | Status: DC
  Administered 2023-06-10 – 2023-06-12 (×4): 40 mg via ORAL
  Filled 2023-06-10 (×4): qty 1

## 2023-06-10 MED ORDER — ACETAMINOPHEN 325 MG PO TABS
650.0000 mg | ORAL_TABLET | Freq: Four times a day (QID) | ORAL | Status: DC | PRN
  Administered 2023-06-11 (×3): 650 mg via ORAL
  Filled 2023-06-10 (×3): qty 2

## 2023-06-10 MED ORDER — CHLORTHALIDONE 25 MG PO TABS
25.0000 mg | ORAL_TABLET | Freq: Every day | ORAL | Status: DC
  Administered 2023-06-11 – 2023-06-12 (×2): 25 mg via ORAL
  Filled 2023-06-10 (×2): qty 1

## 2023-06-10 MED ORDER — TRAMADOL HCL 50 MG PO TABS
50.0000 mg | ORAL_TABLET | Freq: Four times a day (QID) | ORAL | Status: DC | PRN
  Administered 2023-06-11 (×3): 50 mg via ORAL
  Filled 2023-06-10 (×4): qty 1

## 2023-06-10 MED ORDER — LOPERAMIDE HCL 2 MG PO CAPS
2.0000 mg | ORAL_CAPSULE | Freq: Three times a day (TID) | ORAL | Status: DC | PRN
  Administered 2023-06-11: 2 mg via ORAL
  Filled 2023-06-10: qty 1

## 2023-06-10 NOTE — ED Provider Notes (Signed)
 Signout from Dr. Charlee Conine.  68 year old male history of ENT cancer here with chest pain left arm pain over the last few days.  Troponins flat.  Cardiology consulted and are recommending medical admission for further workup.  Hospitalist has been paged.  Plan is to review case with hospitalist for admission. Physical Exam  BP (!) 183/80   Pulse 90   Temp 98.1 F (36.7 C)   Resp 17   SpO2 100%   Physical Exam  Procedures  Procedures  ED Course / MDM    Medical Decision Making Amount and/or Complexity of Data Reviewed Labs: ordered. Radiology: ordered.  Risk Prescription drug management. Decision regarding hospitalization.   1645.  Discussed with [facility] hospitalist Dr Lydia Sams who will put the patient in for admission.       Tonya Fredrickson, MD 06/11/23 1036

## 2023-06-10 NOTE — ED Notes (Signed)
Monisha with cl called for transport

## 2023-06-10 NOTE — H&P (Signed)
 History and Physical  Randall [name] HYQ:[id number] DOB: 1955-05-20 DOA: 06/10/2023  PCP: Margaret Sharp, PA-C   Chief Complaint: Chest pain  HPI: Randall [name] is a 68 y.o. male with medical history significant for SCC of throat, SP radiation, chronic neck lymphedema, inguinal hernia, HFpEF, HTN, type II DM, elevated coronary calcium  score 683, HLD, CKD stage II who presented to [facility] ED for evaluation of chest pain.  ED Course: Initial vitals stable self hypertensive with SBP in the 170s to 190s.  Initial vitals significant for sodium 133, normal kidney function, WBC 5.7, Hgb 12.2, troponin 23->21, D-dimer 0.3.  EKG shows sinus rhythm, LVH, RBBB, LAFB but no ischemic changes. CXR with no active disease.  Patient received IV morphine 4 mg x 1, IV Toradol  50 mg x 1, IV Zofran  4 mg x 1.  Cardiology was consulted and recommended admission for cardiac clearance due to pending hernia surgery.  Patient admitted to [facility] service and transferred to [facility].  Review of Systems: Please see HPI for pertinent positives and negatives. A complete 10 system review of systems are otherwise negative.  Past Medical History:  Diagnosis Date   Arthritis    CHF (congestive heart failure) (HCC)    CKD (chronic kidney disease) stage 2, GFR 60-89 ml/min    Complication of anesthesia    Diabetes mellitus without complication (HCC)    Elevated coronary artery calcium  score 02/02/2021   GERD (gastroesophageal reflux disease)    History of kidney stones 2014   Hypertension    Kidney stone 06/17/2015   Laryngeal mass    squamous carcinoid cancer   Lymphedema    PONV (postoperative nausea and vomiting)    Rupture of right quadriceps tendon 09/21/2020   - s/p srugical repair   Squamous cell carcinoma of neck    Thoracic aortic atherosclerosis (HCC)    Tuberculosis 1980   Type 2 diabetes mellitus without complication, without long-term current use of insulin  (HCC) 03/21/2020   Past Surgical  History:  Procedure Laterality Date   KIDNEY SURGERY     KNEE SURGERY     LITHOTRIPSY     MICROLARYNGOSCOPY Bilateral 06/17/2022   Procedure: DIRECT LARYNGOSCOPY WITH BIOPSY;  Surgeon: Daleen Dubs, DO;  Location: [facility] OR;  Service: ENT;  Laterality: Bilateral;   QUADRICEPS TENDON REPAIR Right 09/25/2020   Procedure: REPAIR RIGHT QUADRICEP TENDON;  Surgeon: Timothy Ford, MD;  Location: [facility] OR;  Service: Orthopedics;  Laterality: Right;   TONSILLECTOMY     as a child   Social History:  reports that he has never smoked. He has never used smokeless tobacco. He reports that he does not drink alcohol and does not use drugs.  Allergies  Allergen Reactions   Norgesic Forte [Orphenadrine-Aspirin -Caffeine] Other (See Comments)    Unknown reaction    Latex Hives and Rash    Blisters    History reviewed. No pertinent family history.   Prior to Admission medications   Medication Sig Start Date End Date Taking? Authorizing Provider  aspirin  EC 81 MG tablet Take 81 mg by mouth daily. Swallow whole.    [provider]  atorvastatin  (LIPITOR) 20 MG tablet Take 20 mg by mouth daily.    [provider]  carvedilol  (COREG ) 25 MG tablet Take 25 mg by mouth 2 (two) times daily with a meal.    [provider]  cetirizine (ZYRTEC) 10 MG tablet Take 10 mg by mouth every evening.    [provider]  chlorthalidone  (HYGROTON ) 25 MG tablet Take 1 tablet (25 mg total) by mouth daily. 05/03/23   Arleen Lacer, MD  HYDROcodone -acetaminophen  (HYCET) 7.5-325 mg/15 ml solution Take 10 mL up to TID as needed for pain - not to exceed 30mL daily. Patient taking differently: Take 10 mLs by mouth in the morning and at bedtime. 02/27/23   Colie Dawes, MD  loperamide (IMODIUM A-D) 2 MG tablet Take 2 mg by mouth 3 (three) times daily as needed for diarrhea or loose stools.    [provider]  meloxicam  (MOBIC ) 15 MG tablet Take 15 mg by mouth daily. 09/07/20   [provider]  Multiple Vitamin (MULTIVITAMIN PO) Take 1 tablet by mouth daily.    [provider]  ondansetron  (ZOFRAN ) 8 MG tablet Take 8 mg by mouth every 8 (eight) hours as needed for nausea or vomiting.    [provider]  OVER THE COUNTER MEDICATION Take 30 mLs by mouth every 6 (six) hours as needed (Pain). PainQuil    [provider]  oxymetazoline  (AFRIN) 0.05 % nasal spray Place 1 spray into both nostrils 2 (two) times daily as needed for congestion.    [provider]  pantoprazole (PROTONIX) 40 MG tablet Take 40 mg by mouth daily.    [provider]  polyethylene glycol (MIRALAX / GLYCOLAX) 17 g packet Take 17 g by mouth daily as needed for moderate constipation.    [provider]  potassium chloride  (MICRO-K ) 10 MEQ CR capsule Take 10 mEq by mouth 2 (two) times daily. 03/06/23   [provider]  PREVIDENT 5000 ENAMEL PROTECT 1.1-5 % GEL Place 1 application  onto teeth at bedtime.    [provider]  tadalafil (CIALIS) 20 MG tablet Take 20 mg by mouth daily as needed for erectile dysfunction. 07/27/20   [provider]  telmisartan (MICARDIS) 80 MG tablet Take 80 mg by mouth daily.    [provider]  traMADol (ULTRAM) 50 MG tablet Take 50 mg by mouth every 6 (six) hours as needed for moderate pain (pain score 4-6). 05/25/23   [provider]  TRULICITY 0.75 MG/0.5ML SOAJ Inject 0.75 mg into the skin once a week.    [provider]    Physical Exam: BP (!) 191/75 (BP Location: Right Arm)   Pulse 75   Temp 98 F (36.7 C) (Oral)   Resp 16   Ht 5\' 10"  (1.778 m)   Wt 75.8 kg Comment: shoes on and pockets full  SpO2 100%   BMI 23.99 kg/m  General: Pleasant, well-appearing *** laying in bed. No acute distress. HEENT: [place]/AT. Anicteric sclera CV: RRR. No murmurs, rubs, or gallops. No LE edema Pulmonary: Lungs CTAB. Normal effort. No wheezing or rales. Abdominal: Soft, nontender,  nondistended. Normal bowel sounds. Extremities: Palpable radial and DP pulses. Normal ROM. Skin: Warm and dry. No obvious rash or lesions. Neuro: A&Ox3. Moves all extremities. Normal sensation to light touch. No focal deficit. Psych: Normal mood and affect          Labs on Admission:  Basic Metabolic Panel: Recent Labs  Lab 06/07/23 1353 06/10/23 1242  NA 131* 133*  K 3.9 3.8  CL 97* 96*  CO2 26 25  GLUCOSE 101* 137*  BUN 11 14  CREATININE 0.95 0.91  CALCIUM  9.2 10.1   Liver Function Tests: No results for input(s): "AST", "ALT", "ALKPHOS", "BILITOT", "PROT", "ALBUMIN" in the last 168 hours. No results for input(s): "LIPASE", "AMYLASE" in  the last 168 hours. No results for input(s): "AMMONIA" in the last 168 hours. CBC: Recent Labs  Lab 06/07/23 1353 06/10/23 1242  WBC 6.1 5.7  HGB 11.0* 12.2*  HCT 31.4* 35.9*  MCV 92.4 93.2  PLT 207 212   Cardiac Enzymes: No results for input(s): "CKTOTAL", "CKMB", "CKMBINDEX", "TROPONINI" in the last 168 hours. BNP (last 3 results) No results for input(s): "BNP" in the last 8760 hours.  ProBNP (last 3 results) No results for input(s): "PROBNP" in the last 8760 hours.  CBG: Recent Labs  Lab 06/07/23 1336  GLUCAP 83    Radiological Exams on Admission: DG Chest 2 View Result Date: 06/10/2023 CLINICAL DATA:  Chest pain and arm pain. EXAM: CHEST - 2 VIEW COMPARISON:  None Available. FINDINGS: The lungs are clear without focal pneumonia, edema, pneumothorax or pleural effusion. The cardiopericardial silhouette is within normal limits for size. No acute bony abnormality. IMPRESSION: No active cardiopulmonary disease. Electronically Signed   By: Donnal Fusi M.D.   On: 06/10/2023 13:25   Assessment/Plan Randall [name] is a 68 y.o. male with medical history significant for SCC of throat, SP radiation, chronic neck lymphedema, inguinal hernia, HFpEF, HTN, type II DM, elevated coronary calcium  score 683, HLD, CKD stage II who  presented to [facility] ED for evaluation of chest pain.   #***  #***  #***  #***  #***  #***  #***  DVT prophylaxis: Lovenox      Code Status: Prior  Consults called: Cardiology  Family Communication: ***  Severity of Illness: The appropriate patient status for this patient is OBSERVATION. Observation status is judged to be reasonable and necessary in order to provide the required intensity of service to ensure the patient's safety. The patient's presenting symptoms, physical exam findings, and initial radiographic and laboratory data in the context of their medical condition is felt to place them at decreased risk for further clinical deterioration. Furthermore, it is anticipated that the patient will be medically stable for discharge from the hospital within 2 midnights of admission.   Level of care: Progressive   This record has been created using Conservation officer, historic buildings. Errors have been sought and corrected, but may not always be located. Such creation errors do not reflect on the standard of care.   Vita Grip, MD 06/10/2023, 6:57 PM [facility] Pager: [phone] Isaiah 41:10   If 7PM-7AM, please contact night-coverage [email or web address] Password [email or web address]

## 2023-06-10 NOTE — ED Triage Notes (Signed)
 Left arm hurting for 3 days and today worse. Chest pain started today. Midsternal pain.

## 2023-06-10 NOTE — ED Notes (Signed)
Transport to radiology

## 2023-06-10 NOTE — Progress Notes (Signed)
 Plan of Care Note for accepted transfer  Patient: Randall [name]    ZOX:[id number]  DOA: 06/10/2023     Nursing staff, Please call [facility] Admits & Consults System-Wide number on Amion as soon as patient's arrival to the unit (not the listed attending) so that the appropriate admitting provider can evaluate the pt. ASAP to avoid any delay in care.  Facility requesting transfer: [facility] Requesting Provider: Dr. Randal Bury Reason for transfer: Admission for chest pain rule out Facility course: Patient with PMH of SCC of throat, SP radiation, chronic neck lymphedema, HFpEF, HTN, type II DM, elevated coronary calcium  score 683, sees Dr. Addie Holstein, HLD, CKD stage II. Presented to the ED with complaints of left arm pain.  This morning the chest pain which is midsternal in nature. Chest x-ray performed in the ED was unremarkable. EKG showed RBBB and LAFB. Troponins 21 and 23. D-dimer 0.30. Patient continues to have chest pain. Cardiology was consulted via phone.  Recommended admission for further evaluation given upcoming hernia surgery on 6/5.  Plan of care: The patient is accepted for admission to Progressive unit, at [facility]. Please consult cardiology on arrival. Recommended ED provider to control blood pressure better. May require further workup given elevated coronary calcium  score.  Author: Charlean Congress, MD  06/10/2023  Check [email or web address] for on-call coverage.

## 2023-06-10 NOTE — ED Provider Notes (Signed)
 [place] EMERGENCY DEPARTMENT AT [facility] Provider Note   CSN: [id number] Arrival date & time: 06/10/23  1231     History  No chief complaint on file.   Randall [name] is a 68 y.o. male.  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  The patient states that he has been having pain in his left arm and over the past 2 to 3 days.  He states that this morning he started with some pressure in the center of his chest.  It does not radiate.  The patient states that he does have some cough.  He states that this has been productive which is not out of the ordinary for him since he has had radiation treatment.  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  He denies any pleuritic pain.  Denies any leg pain or swelling.  He denies any known history of pulmonary embolism or DVT that he is aware of.        Home Medications Prior to Admission medications   Medication Sig Start Date End Date Taking? Authorizing Provider  aspirin  EC 81 MG tablet Take 81 mg by mouth daily. Swallow whole.    [provider]  atorvastatin  (LIPITOR) 20 MG tablet Take 20 mg by mouth daily.    [provider]  carvedilol  (COREG ) 25 MG tablet Take 25 mg by mouth 2 (two) times daily with a meal.    [provider]  cetirizine (ZYRTEC) 10 MG tablet Take 10 mg by mouth every evening.    [provider]  chlorthalidone  (HYGROTON ) 25 MG tablet Take 1 tablet (25 mg total) by mouth daily. 05/03/23   Arleen Lacer, MD  HYDROcodone -acetaminophen  (HYCET) 7.5-325 mg/15 ml solution Take 10 mL up to TID as needed for pain - not to exceed 30mL daily. Patient taking differently: Take 10 mLs by mouth in the morning and at bedtime. 02/27/23   Colie Dawes, MD  loperamide  (IMODIUM A-D) 2 MG tablet Take 2 mg by mouth 3 (three) times daily as needed for diarrhea or loose stools.    [provider]  meloxicam  (MOBIC ) 15 MG tablet Take 15 mg by mouth daily. 09/07/20   [provider]  Multiple Vitamin (MULTIVITAMIN PO) Take 1 tablet by mouth daily.    [provider]  ondansetron  (ZOFRAN ) 8 MG tablet Take 8 mg by mouth every 8 (eight) hours as needed for nausea or vomiting.    [provider]  OVER THE COUNTER MEDICATION Take 30 mLs by mouth every 6 (six) hours as needed (Pain). PainQuil    [provider]  oxymetazoline  (AFRIN) 0.05 % nasal spray Place 1 spray into both nostrils 2 (two) times daily as needed for congestion.    [provider]  pantoprazole (PROTONIX) 40 MG tablet Take 40 mg by mouth daily.    [provider]  polyethylene glycol (MIRALAX / GLYCOLAX) 17 g packet Take 17 g by mouth daily as needed for moderate constipation.    [provider]  potassium chloride  (MICRO-K ) 10 MEQ CR capsule Take 10 mEq by mouth 2 (two) times daily. 03/06/23   [provider]  PREVIDENT 5000 ENAMEL PROTECT 1.1-5 % GEL Place 1 application  onto teeth at bedtime.    [provider]  tadalafil (CIALIS) 20 MG tablet Take 20 mg by mouth daily as needed for erectile dysfunction. 07/27/20   [provider]  telmisartan (MICARDIS) 80 MG tablet Take 80 mg by mouth daily.    [provider]  traMADol (ULTRAM) 50 MG tablet Take 50 mg by mouth every 6 (six) hours as needed for moderate pain (pain score 4-6). 05/25/23   [provider]  TRULICITY 0.75 MG/0.5ML SOAJ Inject 0.75 mg into the skin once a week.    [provider]      Allergies    Norgesic forte [orphenadrine-aspirin -caffeine] and Latex    Review of Systems   Review of Systems  Respiratory:  Positive for chest tightness.   All other systems reviewed and are negative.   Physical Exam Updated Vital  Signs BP (!) 183/80   Pulse 90   Temp 98.1 F (36.7 C)   Resp 17   SpO2 100%  Physical Exam Vitals and nursing note reviewed.   Gen: NAD Eyes: PERRL, EOMI HEENT: no oropharyngeal swelling Neck: trachea midline Resp: clear to auscultation bilaterally Card: RRR, no murmurs, rubs, or gallops Abd: nontender, nondistended Extremities: no calf tenderness, no edema Vascular: 2+ radial pulses bilaterally, 2+ DP pulses bilaterally Neuro: No focal deficits Skin: no rashes Psyc: acting appropriately   ED Results / Procedures / Treatments   Labs (all labs ordered are listed, but only abnormal results are displayed) Labs Reviewed  BASIC METABOLIC PANEL WITH GFR - Abnormal; Notable for the following components:      Result Value   Sodium 133 (*)    Chloride 96 (*)    Glucose, Bld 137 (*)    All other components within normal limits  CBC - Abnormal; Notable for the following components:   RBC 3.85 (*)    Hemoglobin 12.2 (*)    HCT 35.9 (*)    All other components within normal limits  TROPONIN T, HIGH SENSITIVITY - Abnormal; Notable for the following components:   Troponin T High Sensitivity 23 (*)    All other components within normal limits  TROPONIN T, HIGH SENSITIVITY - Abnormal; Notable for the following components:   Troponin T High Sensitivity 21 (*)    All other components within normal limits  D-DIMER, QUANTITATIVE    EKG None  Radiology DG Chest 2 View Result Date: 06/10/2023 CLINICAL DATA:  Chest pain and arm pain. EXAM: CHEST - 2 VIEW COMPARISON:  None Available. FINDINGS: The lungs are clear without focal pneumonia, edema, pneumothorax or pleural effusion. The cardiopericardial silhouette is within normal limits for size. No acute bony abnormality. IMPRESSION: No active cardiopulmonary disease. Electronically Signed   By: Donnal Fusi M.D.   On: 06/10/2023 13:25    Procedures Procedures    Medications Ordered in ED Medications  morphine (PF) 4 MG/ML  injection 4 mg (has no administration in time range)  ondansetron  (ZOFRAN ) injection 4 mg (has no administration in time range)  ketorolac  (TORADOL ) 15 MG/ML injection 15 mg (15 mg Intravenous Given 06/10/23 1602)    ED Course/ Medical Decision Making/ A&P                                 Medical Decision Making 68 year old male with past medical history of ENT cancer, diabetes, GERD, and CHF presenting to the emergency department today with chest pressure.  I will further evaluate the patient here with basic labs Wels and EKG, chest  x-ray, and troponin for further evaluation for ACS, pulmonary edema, pulmonary infiltrates, pneumothorax.  Will also obtain a D-dimer to eval for pulmonary embolism.  Based on description of his symptoms suspicion is relatively low but with the hemoptysis will further evaluate with this.  I will reevaluate for ultimate disposition.  Based on description of his symptoms and reassuring neurovascular exam suspicion for aortic dissection is low at this time.  The patient's cardiac workup is largely unremarkable.  I reassessed the patient and he is still having some chest pain.  He is scheduled to have hernia surgery on Thursday.  I called and discussed this with Dr. Donnette Gal of cardiology.  Given his surgery coming up he does recommend admission to the hospitalist service and they will consult for cardiac clearance prior to surgery.  Calls placed to hospitalist service for admission.  Amount and/or Complexity of Data Reviewed Labs: ordered. Radiology: ordered.  Risk Prescription drug management. Decision regarding hospitalization.           Final Clinical Impression(s) / ED Diagnoses Final diagnoses:  Chest pain, unspecified type    Rx / DC Orders ED Discharge Orders     None         Carin Charleston, MD 06/10/23 1615

## 2023-06-11 DIAGNOSIS — Z923 Personal history of irradiation: Secondary | ICD-10-CM | POA: Diagnosis not present

## 2023-06-11 DIAGNOSIS — E1122 Type 2 diabetes mellitus with diabetic chronic kidney disease: Secondary | ICD-10-CM | POA: Diagnosis present

## 2023-06-11 DIAGNOSIS — E871 Hypo-osmolality and hyponatremia: Secondary | ICD-10-CM | POA: Diagnosis present

## 2023-06-11 DIAGNOSIS — I1A0 Resistant hypertension: Secondary | ICD-10-CM | POA: Diagnosis present

## 2023-06-11 DIAGNOSIS — K219 Gastro-esophageal reflux disease without esophagitis: Secondary | ICD-10-CM | POA: Diagnosis present

## 2023-06-11 DIAGNOSIS — I25110 Atherosclerotic heart disease of native coronary artery with unstable angina pectoris: Secondary | ICD-10-CM | POA: Diagnosis present

## 2023-06-11 DIAGNOSIS — Z8521 Personal history of malignant neoplasm of larynx: Secondary | ICD-10-CM | POA: Diagnosis not present

## 2023-06-11 DIAGNOSIS — N182 Chronic kidney disease, stage 2 (mild): Secondary | ICD-10-CM | POA: Diagnosis present

## 2023-06-11 DIAGNOSIS — I200 Unstable angina: Secondary | ICD-10-CM

## 2023-06-11 DIAGNOSIS — R079 Chest pain, unspecified: Secondary | ICD-10-CM | POA: Diagnosis present

## 2023-06-11 DIAGNOSIS — E876 Hypokalemia: Secondary | ICD-10-CM | POA: Diagnosis present

## 2023-06-11 DIAGNOSIS — Z7982 Long term (current) use of aspirin: Secondary | ICD-10-CM | POA: Diagnosis not present

## 2023-06-11 DIAGNOSIS — I130 Hypertensive heart and chronic kidney disease with heart failure and stage 1 through stage 4 chronic kidney disease, or unspecified chronic kidney disease: Secondary | ICD-10-CM | POA: Diagnosis present

## 2023-06-11 DIAGNOSIS — Z91040 Latex allergy status: Secondary | ICD-10-CM | POA: Diagnosis not present

## 2023-06-11 DIAGNOSIS — Z791 Long term (current) use of non-steroidal anti-inflammatories (NSAID): Secondary | ICD-10-CM | POA: Diagnosis not present

## 2023-06-11 DIAGNOSIS — I5032 Chronic diastolic (congestive) heart failure: Secondary | ICD-10-CM | POA: Diagnosis present

## 2023-06-11 DIAGNOSIS — I452 Bifascicular block: Secondary | ICD-10-CM | POA: Diagnosis present

## 2023-06-11 DIAGNOSIS — Z7985 Long-term (current) use of injectable non-insulin antidiabetic drugs: Secondary | ICD-10-CM | POA: Diagnosis not present

## 2023-06-11 DIAGNOSIS — K4020 Bilateral inguinal hernia, without obstruction or gangrene, not specified as recurrent: Secondary | ICD-10-CM | POA: Diagnosis present

## 2023-06-11 DIAGNOSIS — T502X5A Adverse effect of carbonic-anhydrase inhibitors, benzothiadiazides and other diuretics, initial encounter: Secondary | ICD-10-CM | POA: Diagnosis present

## 2023-06-11 DIAGNOSIS — I249 Acute ischemic heart disease, unspecified: Secondary | ICD-10-CM | POA: Diagnosis not present

## 2023-06-11 DIAGNOSIS — E785 Hyperlipidemia, unspecified: Secondary | ICD-10-CM | POA: Diagnosis present

## 2023-06-11 DIAGNOSIS — I10 Essential (primary) hypertension: Secondary | ICD-10-CM | POA: Diagnosis not present

## 2023-06-11 LAB — CBC
HCT: 30.4 % — ABNORMAL LOW (ref 39.0–52.0)
Hemoglobin: 10.5 g/dL — ABNORMAL LOW (ref 13.0–17.0)
MCH: 31.9 pg (ref 26.0–34.0)
MCHC: 34.5 g/dL (ref 30.0–36.0)
MCV: 92.4 fL (ref 80.0–100.0)
Platelets: 166 10*3/uL (ref 150–400)
RBC: 3.29 MIL/uL — ABNORMAL LOW (ref 4.22–5.81)
RDW: 13.1 % (ref 11.5–15.5)
WBC: 5 10*3/uL (ref 4.0–10.5)
nRBC: 0 % (ref 0.0–0.2)

## 2023-06-11 LAB — LIPID PANEL
Cholesterol: 103 mg/dL (ref 0–200)
HDL: 43 mg/dL (ref >40)
LDL Cholesterol: 54 mg/dL (ref 0–99)
Total CHOL/HDL Ratio: 2.4 ratio
Triglycerides: 32 mg/dL (ref <150)
VLDL: 6 mg/dL (ref 0–40)

## 2023-06-11 LAB — GLUCOSE, CAPILLARY
Glucose-Capillary: 94 mg/dL (ref 70–99)
Glucose-Capillary: 158 mg/dL — ABNORMAL HIGH (ref 70–99)
Glucose-Capillary: 107 mg/dL — ABNORMAL HIGH (ref 70–99)
Glucose-Capillary: 127 mg/dL — ABNORMAL HIGH (ref 70–99)

## 2023-06-11 LAB — BASIC METABOLIC PANEL WITH GFR
Anion gap: 10 (ref 5–15)
BUN: 10 mg/dL (ref 8–23)
CO2: 21 mmol/L — ABNORMAL LOW (ref 22–32)
Calcium: 8.9 mg/dL (ref 8.9–10.3)
Chloride: 102 mmol/L (ref 98–111)
Creatinine, Ser: 0.97 mg/dL (ref 0.61–1.24)
GFR, Estimated: >60 mL/min (ref >60)
Glucose, Bld: 247 mg/dL — ABNORMAL HIGH (ref 70–99)
Potassium: 3.1 mmol/L — ABNORMAL LOW (ref 3.5–5.1)
Sodium: 133 mmol/L — ABNORMAL LOW (ref 135–145)

## 2023-06-11 LAB — HEPARIN LEVEL (UNFRACTIONATED)
Heparin Unfractionated: 0.13 [IU]/mL — ABNORMAL LOW (ref 0.30–0.70)
Heparin Unfractionated: 0.2 [IU]/mL — ABNORMAL LOW (ref 0.30–0.70)

## 2023-06-11 LAB — TSH: TSH: 1.86 u[IU]/mL (ref 0.350–4.500)

## 2023-06-11 LAB — MAGNESIUM: Magnesium: 1.7 mg/dL (ref 1.7–2.4)

## 2023-06-11 MED ORDER — HEPARIN BOLUS VIA INFUSION
4000.0000 [IU] | Freq: Once | INTRAVENOUS | Status: AC
  Administered 2023-06-11: 4000 [IU] via INTRAVENOUS
  Filled 2023-06-11: qty 4000

## 2023-06-11 MED ORDER — INSULIN ASPART 100 UNIT/ML IJ SOLN: 0.0000 [IU] | Freq: Three times a day (TID) | INTRAMUSCULAR | Status: DC

## 2023-06-11 MED ORDER — MORPHINE SULFATE (PF) 2 MG/ML IV SOLN
1.0000 mg | INTRAVENOUS | Status: DC | PRN
  Administered 2023-06-11 – 2023-06-12 (×11): 2 mg via INTRAVENOUS
  Filled 2023-06-11 (×11): qty 1

## 2023-06-11 MED ORDER — HEPARIN BOLUS VIA INFUSION
1000.0000 [IU] | Freq: Once | INTRAVENOUS | Status: AC
  Administered 2023-06-11: 1000 [IU] via INTRAVENOUS
  Filled 2023-06-11: qty 1000

## 2023-06-11 MED ORDER — HEPARIN (PORCINE) 25000 UT/250ML-% IV SOLN
1200.0000 [IU]/h | INTRAVENOUS | Status: DC
  Administered 2023-06-11: 900 [IU]/h via INTRAVENOUS
  Administered 2023-06-12: 1200 [IU]/h via INTRAVENOUS
  Filled 2023-06-11 (×2): qty 250

## 2023-06-11 MED ORDER — POTASSIUM CHLORIDE CRYS ER 20 MEQ PO TBCR
40.0000 meq | EXTENDED_RELEASE_TABLET | ORAL | Status: AC
  Administered 2023-06-11 (×2): 40 meq via ORAL
  Filled 2023-06-11 (×2): qty 2

## 2023-06-11 MED ORDER — ATORVASTATIN CALCIUM 80 MG PO TABS
80.0000 mg | ORAL_TABLET | Freq: Every day | ORAL | Status: DC
  Administered 2023-06-11 – 2023-06-12 (×2): 80 mg via ORAL
  Filled 2023-06-11 (×2): qty 1

## 2023-06-11 NOTE — Progress Notes (Signed)
 PHARMACY - ANTICOAGULATION CONSULT NOTE  Pharmacy Consult for heparin Indication: chest pain/ACS  Allergies  Allergen Reactions   Norgesic Forte [Orphenadrine-Aspirin -Caffeine] Other (See Comments)    Unknown reaction    Latex Hives and Rash    Blisters    Patient Measurements: Height: 5\' 10"  (177.8 cm) Weight: 75.8 kg (167 lb 3.2 oz) (shoes on and pockets full) IBW/kg (Calculated) : 73 HEPARIN DW (KG): 75.8  Vital Signs: Temp: 97.6 F (36.4 C) (06/01 0457) Temp Source: Oral (06/01 0457) BP: 156/70 (06/01 0457) Pulse Rate: 79 (06/01 0457)  Labs: Recent Labs    06/10/23 1242 06/11/23 0455  HGB 12.2* 10.5*  HCT 35.9* 30.4*  PLT 212 166  CREATININE 0.91 0.97    Estimated Creatinine Clearance: 76.3 mL/min (by C-G formula based on SCr of 0.97 mg/dL).   Medical History: Past Medical History:  Diagnosis Date   Arthritis    CHF (congestive heart failure) (HCC)    CKD (chronic kidney disease) stage 2, GFR 60-89 ml/min    Complication of anesthesia    Diabetes mellitus without complication (HCC)    Elevated coronary artery calcium  score 02/02/2021   GERD (gastroesophageal reflux disease)    History of kidney stones 2014   Hypertension    Kidney stone 06/17/2015   Laryngeal mass    squamous carcinoid cancer   Lymphedema    PONV (postoperative nausea and vomiting)    Rupture of right quadriceps tendon 09/21/2020   - s/p srugical repair   Squamous cell carcinoma of neck    Thoracic aortic atherosclerosis (HCC)    Tuberculosis 1980   Type 2 diabetes mellitus without complication, without long-term current use of insulin  (HCC) 03/21/2020    Medications:  Scheduled:   aspirin  EC  81 mg Oral Daily   atorvastatin   80 mg Oral Daily   carvedilol   25 mg Oral BID WC   chlorthalidone   25 mg Oral Daily   enoxaparin  (LOVENOX ) injection  40 mg Subcutaneous Q24H   HYDROcodone -acetaminophen   10 mL Oral BID   insulin  aspart  0-9 Units Subcutaneous TID WC   irbesartan   300  mg Oral Daily   multivitamin with minerals  1 tablet Oral Daily   naproxen  375 mg Oral TID WC   pantoprazole  40 mg Oral BID   potassium chloride   10 mEq Oral BID   Infusions:   Assessment: 89 YOM who presented for evaluation of chest pain. Reports that he had L arm pain on Thursday that worsened at work. Additionally, reports chest pain that worsened yesterday. Cardiology has been consulted for evaluation and cardiac clearance given patient is pending a hernia surgery. No anticoagulation PTA. Pharmacy consulted to dose heparin in setting of unstable angina.   Patient was on enoxaparin  for DVT prophylaxis and received last dose on 5/31 at 2315. Hgb 10.5, PLT 166. Patient dose have history of blood-tinged sputum, so will continue to monitor while on heparin.  Goal of Therapy:  Heparin level 0.3-0.7 units/ml Monitor platelets by anticoagulation protocol: Yes   Plan:  Give 4000 units bolus x 1 Start heparin infusion at 900 units/hr Check anti-Xa level in 6 hours and daily while on heparin Continue to monitor H&H and platelets  Juleen Oakland, PharmD PGY1 Pharmacy Resident 06/11/2023 7:10 AM

## 2023-06-11 NOTE — Consult Note (Signed)
 Cardiology Consultation:   Patient ID: Randall [name] MRN: [id number]; DOB: 02-18-1955  Admit date: 06/10/2023 Date of Consult: 06/11/2023  Primary Care Provider: Margaret Sharp, PA-C [facility] Cardiologist: Randall Bustard, MD  [facility] Electrophysiologist:  None   Patient Profile:   Randall [name] is a 68 y.o. 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  History of Present Illness:   [name] presented to the [facility] ED on 05/31 with 3 days of left arm discomfort with associated midsternal chest pain that began on 05/31.  Prior to presenting to the ED he had chest discomfort.  He had been having a productive cough although this was not abnormal for him with prior radiation treatment. He also had a small amount of blood-tinged sputum which again seemed chronic and per his report was associated with radiation tx.   VS at [facility] ED: BP 183/80, P 90, T 98.1, RR 17, O2 100%/RA.   He has an upcoming hernia surgery scheduled so Rudy Costain, [place] had recommended admission/obs for further evaluation prior to his scheduled elective surgery.   Labs notable for mild hsT leak (23->21) which was flat. D-dimer normal.   ECG with RBBB/LAFB which is intermittent from 09/25/20 with some ECG with normal conduction and others with 1' AVB, RBBB and LAFB. No ischemic changes.   He is followed by Dr. Addie [name] as an outpatient anda has an elevated CAC score of 683.   Mr. Weinberg reports that over the past few months he has been working extremely long days with his job. On Thursday he had acute onset left arm pain of moderate severity that progressively got worse.  He subsequently developed central chest pain which he described as "an elephant on his chest" and a "12/10" severity.  He said the pain was worse than the  prior surgery on his quad which was excruciating.  There were not aggravating or alleviating factors and the pain eventually abated after he came to the hospital and went to sleep last night. During my evaluation he was CP free and had minimal L arm pain.   Past Medical History:  Diagnosis Date   Arthritis    CHF (congestive heart failure) (HCC)    CKD (chronic kidney disease) stage 2, GFR 60-89 ml/min    Complication of anesthesia    Diabetes mellitus without complication (HCC)    Elevated coronary artery calcium  score 02/02/2021   GERD (gastroesophageal reflux disease)    History of kidney stones 2014   Hypertension    Kidney stone 06/17/2015   Laryngeal mass    squamous carcinoid cancer   Lymphedema    PONV (postoperative nausea and vomiting)    Rupture of right quadriceps tendon 09/21/2020   - s/p srugical repair   Squamous cell carcinoma of neck    Thoracic aortic atherosclerosis (HCC)    Tuberculosis 1980   Type 2 diabetes mellitus without complication, without long-term current use of insulin  (HCC) 03/21/2020   Past Surgical History:  Procedure Laterality Date   KIDNEY SURGERY     KNEE SURGERY     LITHOTRIPSY     MICROLARYNGOSCOPY Bilateral 06/17/2022   Procedure: DIRECT LARYNGOSCOPY WITH BIOPSY;  Surgeon: Randall Dubs, DO;  Location: [facility] OR;  Service: ENT;  Laterality: Bilateral;   QUADRICEPS TENDON REPAIR Right  09/25/2020   Procedure: REPAIR RIGHT QUADRICEP TENDON;  Surgeon: Randall Ford, MD;  Location: [facility] OR;  Service: Orthopedics;  Laterality: Right;   TONSILLECTOMY     as a child    Home Medications:  Prior to Admission medications   Medication Sig Start Date End Date Taking? Authorizing Provider  aspirin  EC 81 MG tablet Take 81 mg by mouth daily. Swallow whole.   Yes [provider]  Aspirin -Salicylamide-Caffeine (BC HEADACHE POWDER PO) Take 1 Application by mouth daily.   Yes [provider]  atorvastatin  (LIPITOR) 20 MG tablet Take 20  mg by mouth daily.   Yes [provider]  carvedilol  (COREG ) 25 MG tablet Take 25 mg by mouth 2 (two) times daily with a meal.   Yes [provider]  cetirizine (ZYRTEC) 10 MG tablet Take 10 mg by mouth every evening.   Yes [provider]  chlorthalidone  (HYGROTON ) 25 MG tablet Take 1 tablet (25 mg total) by mouth daily. 05/03/23  Yes Randall Lacer, MD  HYDROcodone -acetaminophen  (HYCET) 7.5-325 mg/15 ml solution Take 10 mL up to TID as needed for pain - not to exceed 30mL daily. Patient taking differently: Take 10 mLs by mouth in the morning and at bedtime. 02/27/23  Yes Randall Dawes, MD  loperamide (IMODIUM A-D) 2 MG tablet Take 2 mg by mouth 3 (three) times daily as needed for diarrhea or loose stools.   Yes [provider]  meloxicam  (MOBIC ) 15 MG tablet Take 15 mg by mouth daily. 09/07/20  Yes [provider]  ondansetron  (ZOFRAN ) 8 MG tablet Take 8 mg by mouth every 8 (eight) hours as needed for nausea or vomiting.   Yes [provider]  OVER THE COUNTER MEDICATION Take 30 mLs by mouth every 6 (six) hours as needed (Pain). PainQuil   Yes [provider]  oxymetazoline  (AFRIN) 0.05 % nasal spray Place 1 spray into both nostrils 2 (two) times daily as needed for congestion.   Yes [provider]  pantoprazole (PROTONIX) 40 MG tablet Take 40 mg by mouth daily.   Yes [provider]  polyethylene glycol (MIRALAX / GLYCOLAX) 17 g packet Take 17 g by mouth daily as needed for moderate constipation.   Yes [provider]  potassium chloride  (MICRO-K ) 10 MEQ CR capsule Take 10 mEq by mouth 2 (two) times daily. 03/06/23  Yes [provider]  PREVIDENT 5000 ENAMEL PROTECT 1.1-5 % GEL Place 1 application  onto teeth at bedtime.   Yes [provider]  telmisartan (MICARDIS) 80 MG tablet Take 80 mg by mouth daily.   Yes [provider]  traMADol (ULTRAM) 50 MG tablet Take 50 mg by mouth every  6 (six) hours as needed for moderate pain (pain score 4-6). 05/25/23  Yes [provider]  TRULICITY 0.75 MG/0.5ML SOAJ Inject 0.75 mg into the skin once a week.   Yes [provider]  Multiple Vitamin (MULTIVITAMIN PO) Take 1 tablet by mouth daily.    [provider]  tadalafil (CIALIS) 20 MG tablet Take 20 mg by mouth daily as needed for erectile dysfunction. 07/27/20   [provider]   Inpatient Medications: Scheduled Meds:  aspirin  EC  81 mg Oral Daily   atorvastatin   20 mg Oral Daily   carvedilol   25 mg Oral BID WC   chlorthalidone   25 mg Oral Daily   enoxaparin  (LOVENOX ) injection  40 mg Subcutaneous Q24H   HYDROcodone -acetaminophen   10 mL Oral BID  irbesartan   300 mg Oral Daily   multivitamin with minerals  1 tablet Oral Daily   naproxen  375 mg Oral TID WC   pantoprazole  40 mg Oral BID   potassium chloride   10 mEq Oral BID   Continuous Infusions:  PRN Meds: acetaminophen  **OR** acetaminophen , loperamide, ondansetron  **OR** ondansetron  (ZOFRAN ) IV, oxymetazoline , senna-docusate, traMADol  Allergies:    Allergies  Allergen Reactions   Norgesic Forte [Orphenadrine-Aspirin -Caffeine] Other (See Comments)    Unknown reaction    Latex Hives and Rash    Blisters   Social History:   Social History   Socioeconomic History   Marital status: Married    Spouse name: Not on file   Number of children: 1   Years of education: Not on file   Highest education level: Not on file  Occupational History   Not on file  Tobacco Use   Smoking status: Never   Smokeless tobacco: Never  Vaping Use   Vaping status: Never Used  Substance and Sexual Activity   Alcohol use: Never   Drug use: Never   Sexual activity: Not Currently  Other Topics Concern   Not on file  Social History Narrative   Usually very active - but no "routine exercise". 1 daughter, 2 stepsons   Social Drivers of Corporate investment banker Strain: Not on file  Food  Insecurity: No Food Insecurity (04/06/2023)   Hunger Vital Sign    Worried About Running Out of Food in the Last Year: Never true    Ran Out of Food in the Last Year: Never true  Transportation Needs: No Transportation Needs (04/06/2023)   PRAPARE - Administrator, Civil Service (Medical): No    Lack of Transportation (Non-Medical): No  Physical Activity: Not on file  Stress: Not on file  Social Connections: Unknown (04/06/2023)   Social Connection and Isolation Panel [NHANES]    Frequency of Communication with Friends and Family: Patient declined    Frequency of Social Gatherings with Friends and Family: Patient declined    Attends Religious Services: Patient declined    Database administrator or Organizations: Patient declined    Attends Banker Meetings: Patient declined    Marital Status: Married  Catering manager Violence: Not At Risk (04/06/2023)   Humiliation, Afraid, Rape, and Kick questionnaire    Fear of Current or Ex-Partner: No    Emotionally Abused: No    Physically Abused: No    Sexually Abused: No    Family History:   History reviewed. No pertinent family history.   ROS:  Review of Systems: [y] = yes, [ ]  = no      General: Weight gain [ ] ; Weight loss [ ] ; Anorexia [ ] ; Fatigue [ ] ; Fever [ ] ; Chills [ ] ; Weakness [ ]    Cardiac: Chest pain/pressure [y]; Resting SOB [ ] ; Exertional SOB [ ] ; Orthopnea [ ] ; Pedal Edema [ ] ; Palpitations [ ] ; Syncope [ ] ; Presyncope [ ] ; Paroxysmal nocturnal dyspnea [ ]    Pulmonary: Cough [ ] ; Wheezing [ ] ; Hemoptysis [ ] ; Sputum [ ] ; Snoring [ ]    GI: Vomiting [ ] ; Dysphagia [ ] ; Melena [ ] ; Hematochezia [ ] ; Heartburn [ ] ; Abdominal pain [ ] ; Constipation [ ] ; Diarrhea [ ] ; BRBPR [ ]    GU: Hematuria [ ] ; Dysuria [ ] ; Nocturia [ ]  Vascular: Pain in legs with walking [ ] ; Pain in feet with lying flat [ ] ; Non-healing sores [ ] ;  Stroke [ ] ; TIA [ ] ; Slurred speech [ ] ;   Neuro: Headaches [ ] ; Vertigo [ ] ; Seizures [  ]; Paresthesias [ ] ;Blurred vision [ ] ; Diplopia [ ] ; Vision changes [ ]    Ortho/Skin: Arthritis [ ] ; Joint pain [ ] ; Muscle pain [ ] ; Joint swelling [ ] ; Back Pain [ ] ; Rash [ ]    Psych: Depression [ ] ; Anxiety [ ]    Heme: Bleeding problems [ ] ; Clotting disorders [ ] ; Anemia [ ]    Endocrine: Diabetes [ ] ; Thyroid  dysfunction [ ]    Physical Exam/Data:   Vitals:   06/10/23 1840 06/10/23 1944 06/10/23 2244 06/11/23 0034  BP: (!) 191/75 (!) 175/96 138/64 (!) 154/71  Pulse: 75 68 60 66  Resp: 16 20 17 18   Temp: 98 F (36.7 C) 98.1 F (36.7 C)    TempSrc: Oral Oral  Oral  SpO2: 100% 100% 100% 98%  Weight: 75.8 kg     Height: 5\' 10"  (1.778 m)       Intake/Output Summary (Last 24 hours) at 06/11/2023 0102 Last data filed at 06/10/2023 2244 Gross per 24 hour  Intake 720 ml  Output --  Net 720 ml      06/10/2023    6:40 PM 06/07/2023    1:34 PM 05/24/2023   10:46 AM  Last 3 Weights  Weight (lbs) 167 lb 3.2 oz 176 lb 6.4 oz 175 lb 12.8 oz  Weight (kg) 75.841 kg 80.015 kg 79.742 kg     Body mass index is 23.99 kg/m.  General:  Well nourished, well developed, in no acute distress HEENT: normal Lymph: no adenopathy Neck: no JVD Endocrine:  No thryomegaly Vascular: No carotid bruits; FA pulses 2+ bilaterally without bruits  Cardiac:  normal S1, S2; RRR; no murmur  Lungs:  clear to auscultation bilaterally, no wheezing, rhonchi or rales  Abd: soft, nontender, no hepatomegaly  Ext: no edema Musculoskeletal:  No deformities, BUE and BLE strength normal and equal Skin: warm and dry  Neuro:  CNs 2-12 intact, no focal abnormalities noted Psych:  Normal affect   EKG:  EKGs were personally reviewed and demonstrated:  06/10/23 (20:31:16): SB 59, PR 236, QRS 168, QT/c 450/445, RBBB, 1' AVB 06/10/23 (12:38:55): NSR 64, PR 176, QRS 178, QT/c 459/474, RBBB/LAFB 06/29/22 (18:57:56): NSR 93, PR 171, QRS 96, QT/c 341/425 09/25/20 (09:03:52): NSR 70, PR 224, QRS 178, QT/c 440/475,  RBBB/LAFB  Telemetry: Telemetry was personally reviewed and demonstrates: NSR   Relevant CV Studies:  TTE Result date: 02/09/23  1. Left ventricular ejection fraction, by estimation, is 55 to 60%. Left  ventricular ejection fraction by 3D volume is 57 %. The left ventricle has  normal function. Left ventricular endocardial border not optimally defined  to evaluate regional wall  motion. Left ventricular diastolic parameters are consistent with Grade II  diastolic dysfunction (pseudonormalization). The average left ventricular  global longitudinal strain is -17.1 %. The global longitudinal strain is  abnormal.   2. Right ventricular systolic function is normal. The right ventricular  size is moderately enlarged. Tricuspid regurgitation signal is inadequate  for assessing PA pressure.   3. Left atrial size was severely dilated.   4. The mitral valve is normal in structure. No evidence of mitral valve  regurgitation. No evidence of mitral stenosis.   5. The aortic valve is tricuspid. There is mild calcification of the  aortic valve. There is mild thickening of the aortic valve. Aortic valve  regurgitation is not visualized. No aortic  stenosis is present.   6. The inferior vena cava is dilated in size with >50% respiratory  variability, suggesting right atrial pressure of 8 mmHg.   Comparison(s): EF 45%, global hypokinesis; LV function has improved.   Laboratory Data:  Chemistry Recent Labs  Lab 06/07/23 1353 06/10/23 1242  NA 131* 133*  K 3.9 3.8  CL 97* 96*  CO2 26 25  GLUCOSE 101* 137*  BUN 11 14  CREATININE 0.95 0.91  CALCIUM  9.2 10.1  GFRNONAA >60 >60  ANIONGAP 8 13    Hematology Recent Labs  Lab 06/07/23 1353 06/10/23 1242  WBC 6.1 5.7  RBC 3.40* 3.85*  HGB 11.0* 12.2*  HCT 31.4* 35.9*  MCV 92.4 93.2  MCH 32.4 31.7  MCHC 35.0 34.0  RDW 12.9 13.1  PLT 207 212   DDimer  Recent Labs  Lab 06/10/23 1448  DDIMER 0.30   Radiology/Studies:  DG Chest 2  View Result Date: 06/10/2023 CLINICAL DATA:  Chest pain and arm pain. EXAM: CHEST - 2 VIEW COMPARISON:  None Available. FINDINGS: The lungs are clear without focal pneumonia, edema, pneumothorax or pleural effusion. The cardiopericardial silhouette is within normal limits for size. No acute bony abnormality. IMPRESSION: No active cardiopulmonary disease. Electronically Signed   By: Donnal Fusi M.D.   On: 06/10/2023 13:25    TIMI Risk Score for Unstable Angina or Non-ST Elevation MI:   The patient's TIMI risk score is 3, which indicates a 13% risk of all cause mortality, new or recurrent myocardial infarction or need for urgent revascularization in the next 14 days.   Assessment and Plan:  ROYSTON BEKELE is a 68 y.o. 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   Chest pain CAD  Mr. Brauer has a convincing story for unstable angina. hsT flat (23-21). RF for CAD include CAC score of 683, DM2, HTN and HLD. He doesn't have a tobacco or etoh history. He has coronary calcifications on prior CT scans. His sx were all at rest and were unchanged with activity.  - s/p ASA, continue ASA 81 mg PO daily - increase OP atorva 20 mg daily to 80 mg daily  - start heparin gtt - lipid panel, A1c, TSH and LPA ordered - TTE ordered - plan for cath Monday pending scheduling   Informed Consent   Shared Decision Making/Informed Consent{ The risks [stroke (1 in 1000), death (1 in 1000), kidney failure [usually temporary] (1 in 500), bleeding (1 in 200), allergic reaction [possibly serious] (1 in 200)], benefits (diagnostic support and management of coronary artery disease) and alternatives of a cardiac catheterization were discussed in detail with Mr. Gassert and he is willing to proceed.  For questions or updates, please contact [facility] Please consult [email or web address] for  contact info under   Signed, Katrine Parody, MD  06/11/2023 1:02 AM

## 2023-06-11 NOTE — Progress Notes (Signed)
   Patient seen earlier this morning in consultation.  Plan for cath tomorrow.  He is on the add on board.  We will see again in the AM.  Please call with questions today if needed.

## 2023-06-11 NOTE — Progress Notes (Signed)
 PHARMACY - ANTICOAGULATION CONSULT NOTE  Pharmacy Consult for heparin Indication: chest pain/ACS  Allergies  Allergen Reactions   Norgesic Forte [Orphenadrine-Aspirin -Caffeine] Other (See Comments)    Unknown reaction    Latex Hives and Rash    Blisters    Patient Measurements: Height: 5\' 10"  (177.8 cm) Weight: 75.8 kg (167 lb 3.2 oz) (shoes on and pockets full) IBW/kg (Calculated) : 73 HEPARIN DW (KG): 75.8  Vital Signs: Temp: 98.3 F (36.8 C) (06/01 1659) Temp Source: Oral (06/01 1659) BP: 149/108 (06/01 1659) Pulse Rate: 60 (06/01 1514)  Labs: Recent Labs    06/10/23 1242 06/11/23 0455 06/11/23 0815 06/11/23 1619  HGB 12.2* 10.5*  --   --   HCT 35.9* 30.4*  --   --   PLT 212 166  --   --   HEPARINUNFRC  --   --  0.13* 0.20*  CREATININE 0.91 0.97  --   --     Estimated Creatinine Clearance: 76.3 mL/min (by C-G formula based on SCr of 0.97 mg/dL).   Medical History: Past Medical History:  Diagnosis Date   Arthritis    CHF (congestive heart failure) (HCC)    CKD (chronic kidney disease) stage 2, GFR 60-89 ml/min    Complication of anesthesia    Diabetes mellitus without complication (HCC)    Elevated coronary artery calcium  score 02/02/2021   GERD (gastroesophageal reflux disease)    History of kidney stones 2014   Hypertension    Kidney stone 06/17/2015   Laryngeal mass    squamous carcinoid cancer   Lymphedema    PONV (postoperative nausea and vomiting)    Rupture of right quadriceps tendon 09/21/2020   - s/p srugical repair   Squamous cell carcinoma of neck    Thoracic aortic atherosclerosis (HCC)    Tuberculosis 1980   Type 2 diabetes mellitus without complication, without long-term current use of insulin  (HCC) 03/21/2020    Medications:  Scheduled:   aspirin  EC  81 mg Oral Daily   atorvastatin   80 mg Oral Daily   carvedilol   25 mg Oral BID WC   chlorthalidone   25 mg Oral Daily   HYDROcodone -acetaminophen   10 mL Oral BID   insulin  aspart   0-9 Units Subcutaneous TID WC   irbesartan   300 mg Oral Daily   multivitamin with minerals  1 tablet Oral Daily   naproxen  375 mg Oral TID WC   pantoprazole  40 mg Oral BID   potassium chloride   10 mEq Oral BID   Infusions:   heparin 900 Units/hr (06/11/23 0946)    Assessment: 41 YOM who presented for evaluation of chest pain. Reports that he had L arm pain on Thursday that worsened at work. Additionally, reports chest pain that worsened yesterday. Cardiology has been consulted for evaluation and cardiac clearance given patient is pending a hernia surgery. No anticoagulation PTA. Pharmacy consulted to dose heparin in setting of unstable angina.   Patient was on enoxaparin  for DVT prophylaxis and received last dose on 5/31 at 2315. Hgb 10.5, PLT 166. Patient dose have history of blood-tinged sputum, so will continue to monitor while on heparin.  1710: no new issues with infusion/bleeding per RN, heparin level subtherapeutic at 0.2 on 900 units/hr  Goal of Therapy:  Heparin level 0.3-0.7 units/ml Monitor platelets by anticoagulation protocol: Yes   Plan:  Give 1000 units bolus x 1 Increase heparin infusion to 1050 units/hr Check anti-Xa level in 6 hours and daily while on heparin Continue to  monitor H&H and platelets  Heddy Liverpool, PharmD PGY2 Critical Care Pharmacy Resident 06/11/2023 5:16 PM

## 2023-06-11 NOTE — Care Management Obs Status (Signed)
 MEDICARE OBSERVATION STATUS NOTIFICATION   Patient Details  Name: Randall [name] MRN: [id number] Date of Birth: 1955/07/12   Medicare Observation Status Notification Given:  Yes    Cindia Crease, RN 06/11/2023, 3:35 PM

## 2023-06-11 NOTE — Progress Notes (Signed)
 Progress Note   Patient: Randall [name] ZOX:[id number] DOB: Oct 26, 1955 DOA: 06/10/2023     0 DOS: the patient was seen and examined on 06/11/2023   Brief hospital course: Randall [name] is a 68 y.o. male with medical history significant for SCC of throat, SP radiation, chronic neck lymphedema, inguinal hernia, HFpEF, HTN, type II DM, elevated coronary calcium  score 683, HLD, CKD stage II who presented to [facility] for evaluation of chest pain.  Patient reports he had some left arm pain on Thursday that was worst yesterday while at work.  He also had chest pain described as pressure on his chest that started yesterday but became worse today.  He reports associated left arm numbness and diaphoresis but has resolved.  He has chronic cough with occasional blood-tinged sputum due to his radiation treatments. He denies any nausea, vomiting, abdominal pain, dizziness, palpitations, fevers or chills.   ED Course: Initial vitals stable self hypertensive with SBP in the 170s to 190s.  Initial vitals significant for sodium 133, normal kidney function, WBC 5.7, Hgb 12.2, troponin 23->21, D-dimer 0.3.  EKG shows sinus rhythm, LVH, RBBB, LAFB but no ischemic changes. CXR with no active disease.  Patient received IV morphine 4 mg x 1, IV Toradol  50 mg x 1, IV Zofran  4 mg x 1. Cardiology was consulted and recommended admission for cardiac clearance due to pending hernia surgery. Patient admitted to [facility] service and transferred to [facility].  The patient has been evaluated by cardiology, and the plan is for [facility] on 06/12/2023.   On 06/11/2023 the patient was having ongoing chest pain. Cardiology was aware. He was given morphine for this pain that he characterized as 6/10 in severity, ache like across his chest and into his left arm.   Assessment and Plan: Randall [name] is a 68 y.o. male with medical history significant for SCC of throat s/p radiation, chronic neck lymphedema, inguinal hernia, HFpEF, HTN, type II  DM, elevated coronary calcium  score 683, HLD, CKD stage II who presented to [facility] for evaluation of chest pain and admitted for further evaluation.   Chest pain CAD - Presented with 2 days of worsening chest pain and left arm pain - EKG without ischemic changes, troponins mildly elevated but flat, 23->21 - Due to his elevated coronary calcium  score of 683, would likely require cardiac clearance prior to hernia surgery - Ongoing chest pain treated with NTG and morphine - Cardiology consulted, appreciate recs - Plan is for [facility] on 06/12/2023. Patient is kept NPO   Inguinal hernia - CT on 05/16/2023 showed small bilateral inguinal hernias containing fat - Patient is scheduled for hernia repair on June 5th - Mild generalized tenderness to palpation of the abdomen - Continue home Hycet and as needed tramadol   HTN HFpEF - BP elevated with SBP in the 130s to 180s - Patient euvolemic on exam - Continue Coreg , chlorthalidone , telmisartan ( irbesartan  as substitute) - Continue potassium supplementation   T2DM - A1c of 5.2% 4 days ago - Sensitive sliding scale with meals, CBG monitoring   Hx of SCC of throat - Status post radiation with associated chronic cough - Continue PRN nasal spray - Pain control as above   HLD - Continue atorvastatin    GERD - Continue Protonix   DVT prophylaxis: Lovenox       Code Status: Full Code   Consults called: Cardiology   Family Communication: DFamily at bedside. All questions answered to the best of my ability.  Subjective: The patient is sitting in bed. He appears concerned and uncomfortable  Physical Exam: Vitals:   06/11/23 0752 06/11/23 1145 06/11/23 1514 06/11/23 1659  BP: (!) 174/87 (!) 166/76 (!) 148/76 (!) 149/108  Pulse: 67 61 60   Resp: 17 19  18   Temp: 98 F (36.7 C) 97.8 F (36.6 C)  98.3 F (36.8 C)  TempSrc: Oral Oral  Oral  SpO2: 99% 100% 100%   Weight:      Height:       Exam:  Constitutional:  The  patient is awake, alert, and oriented x 3. Mil-moderate distress from chest pain. Respiratory:  No increased work of breathing. No wheezes, rales, or rhonchi No tactile fremitus Cardiovascular:  Regular rate and rhythm No murmurs, ectopy, or gallups. No lateral PMI. No thrills. Abdomen:  Abdomen is soft, non-tender, non-distended No hernias, masses, or organomegaly Normoactive bowel sounds.  Musculoskeletal:  No cyanosis, clubbing, or edema Skin:  No rashes, lesions, ulcers palpation of skin: no induration or nodules Neurologic:  CN 2-12 intact Sensation all 4 extremities intact Psychiatric:  Mental status Mood, affect appropriate Orientation to person, place, time  judgment and insight appear intact  Data Reviewed:  Troponins EKG CBC CMP  Disposition: Status is: Inpatient Remains inpatient appropriate because: Ongoing chest pain, concern for ACS.  Plans for [facility] on 06/12/2023.  Planned Discharge Destination: Home    Time spent: 42 minutes  Author: Kathlene Yano, DO 06/11/2023 6:45 PM  For on call review [email or web address].

## 2023-06-12 ENCOUNTER — Encounter (HOSPITAL_COMMUNITY)
Admission: EM | Disposition: A | Discharge status: Home / Self Care | Payer: Self-pay | Source: Home / Self Care | Attending: Internal Medicine

## 2023-06-12 ENCOUNTER — Inpatient Hospital Stay (HOSPITAL_COMMUNITY)

## 2023-06-12 ENCOUNTER — Encounter (HOSPITAL_COMMUNITY): Payer: Self-pay | Admitting: Cardiology

## 2023-06-12 DIAGNOSIS — I25110 Atherosclerotic heart disease of native coronary artery with unstable angina pectoris: Principal | ICD-10-CM

## 2023-06-12 DIAGNOSIS — R079 Chest pain, unspecified: Secondary | ICD-10-CM | POA: Diagnosis not present

## 2023-06-12 HISTORY — PX: LEFT HEART CATH AND CORONARY ANGIOGRAPHY: CATH118249

## 2023-06-12 LAB — BASIC METABOLIC PANEL WITH GFR
Anion gap: 7 (ref 5–15)
BUN: 12 mg/dL (ref 8–23)
CO2: 22 mmol/L (ref 22–32)
Calcium: 8.3 mg/dL — ABNORMAL LOW (ref 8.9–10.3)
Chloride: 99 mmol/L (ref 98–111)
Creatinine, Ser: 0.74 mg/dL (ref 0.61–1.24)
GFR, Estimated: >60 mL/min (ref >60)
Glucose, Bld: 101 mg/dL — ABNORMAL HIGH (ref 70–99)
Potassium: 3.8 mmol/L (ref 3.5–5.1)
Sodium: 128 mmol/L — ABNORMAL LOW (ref 135–145)

## 2023-06-12 LAB — ECHOCARDIOGRAM COMPLETE
AR max vel: 2.26 cm2
AV Area VTI: 2.58 cm2
AV Area mean vel: 2.34 cm2
AV Mean grad: 4 mmHg
AV Peak grad: 8.6 mmHg
Ao pk vel: 1.47 m/s
Area-P 1/2: 3.31 cm2
Calc EF: 57.6 %
Height: 70 in
MV M vel: 4.82 m/s
MV Peak grad: 92.9 mmHg
S' Lateral: 3.8 cm
Single Plane A2C EF: 59 %
Single Plane A4C EF: 57.3 %
Weight: 2675.2 [oz_av]

## 2023-06-12 LAB — GLUCOSE, CAPILLARY
Glucose-Capillary: 103 mg/dL — ABNORMAL HIGH (ref 70–99)
Glucose-Capillary: 85 mg/dL (ref 70–99)
Glucose-Capillary: 133 mg/dL — ABNORMAL HIGH (ref 70–99)

## 2023-06-12 LAB — CBC
HCT: 30.4 % — ABNORMAL LOW (ref 39.0–52.0)
Hemoglobin: 10.3 g/dL — ABNORMAL LOW (ref 13.0–17.0)
MCH: 31.6 pg (ref 26.0–34.0)
MCHC: 33.9 g/dL (ref 30.0–36.0)
MCV: 93.3 fL (ref 80.0–100.0)
Platelets: 164 10*3/uL (ref 150–400)
RBC: 3.26 MIL/uL — ABNORMAL LOW (ref 4.22–5.81)
RDW: 13 % (ref 11.5–15.5)
WBC: 6.2 10*3/uL (ref 4.0–10.5)
nRBC: 0 % (ref 0.0–0.2)

## 2023-06-12 LAB — HEPARIN LEVEL (UNFRACTIONATED)
Heparin Unfractionated: 0.21 [IU]/mL — ABNORMAL LOW (ref 0.30–0.70)
Heparin Unfractionated: 0.24 [IU]/mL — ABNORMAL LOW (ref 0.30–0.70)

## 2023-06-12 LAB — LIPOPROTEIN A (LPA): Lipoprotein (a): 101.1 nmol/L — ABNORMAL HIGH (ref <75.0)

## 2023-06-12 SURGERY — LEFT HEART CATH AND CORONARY ANGIOGRAPHY
Anesthesia: LOCAL

## 2023-06-12 MED ORDER — HEPARIN (PORCINE) IN NACL 2000-0.9 UNIT/L-% IV SOLN
INTRAVENOUS | Status: DC | PRN
  Administered 2023-06-12: 1000 mL

## 2023-06-12 MED ORDER — FENTANYL CITRATE (PF) 100 MCG/2ML IJ SOLN
INTRAMUSCULAR | Status: DC | PRN
  Administered 2023-06-12: 25 ug via INTRAVENOUS

## 2023-06-12 MED ORDER — ASPIRIN 81 MG PO CHEW: 81.0000 mg | CHEWABLE_TABLET | ORAL | Status: DC

## 2023-06-12 MED ORDER — SODIUM CHLORIDE 0.9% FLUSH: 3.0000 mL | Freq: Two times a day (BID) | INTRAVENOUS | Status: DC

## 2023-06-12 MED ORDER — SODIUM CHLORIDE 0.9 % IV SOLN: INTRAVENOUS | Status: AC

## 2023-06-12 MED ORDER — LABETALOL HCL 5 MG/ML IV SOLN: 10.0000 mg | INTRAVENOUS | Status: DC | PRN

## 2023-06-12 MED ORDER — LIDOCAINE HCL (PF) 1 % IJ SOLN
INTRAMUSCULAR | Status: AC
  Filled 2023-06-12: qty 30

## 2023-06-12 MED ORDER — SODIUM CHLORIDE 0.9 % WEIGHT BASED INFUSION: 1.0000 mL/kg/h | INTRAVENOUS | Status: DC

## 2023-06-12 MED ORDER — MIDAZOLAM HCL 2 MG/2ML IJ SOLN
INTRAMUSCULAR | Status: AC
  Filled 2023-06-12: qty 2

## 2023-06-12 MED ORDER — VERAPAMIL HCL 2.5 MG/ML IV SOLN
INTRAVENOUS | Status: AC
Start: 2023-06-12 — End: ?
  Filled 2023-06-12: qty 2

## 2023-06-12 MED ORDER — LIDOCAINE HCL (PF) 1 % IJ SOLN
INTRAMUSCULAR | Status: DC | PRN
  Administered 2023-06-12: 5 mL

## 2023-06-12 MED ORDER — SODIUM CHLORIDE 0.9% FLUSH: 3.0000 mL | INTRAVENOUS | Status: DC | PRN

## 2023-06-12 MED ORDER — HEPARIN SODIUM (PORCINE) 1000 UNIT/ML IJ SOLN
INTRAMUSCULAR | Status: DC | PRN
  Administered 2023-06-12: 4000 [IU] via INTRAVENOUS

## 2023-06-12 MED ORDER — SODIUM CHLORIDE 0.9 % IV SOLN
250.0000 mL | INTRAVENOUS | Status: DC | PRN
Start: 2023-06-12 — End: 2023-06-12

## 2023-06-12 MED ORDER — FENTANYL CITRATE (PF) 100 MCG/2ML IJ SOLN
INTRAMUSCULAR | Status: AC
  Filled 2023-06-12: qty 2

## 2023-06-12 MED ORDER — IOHEXOL 350 MG/ML SOLN
INTRAVENOUS | Status: DC | PRN
Start: 2023-06-12 — End: 2023-06-12
  Administered 2023-06-12: 85 mL

## 2023-06-12 MED ORDER — AMLODIPINE BESYLATE 10 MG PO TABS
10.0000 mg | ORAL_TABLET | Freq: Every day | ORAL | Status: DC
  Administered 2023-06-12: 10 mg via ORAL
  Filled 2023-06-12: qty 1

## 2023-06-12 MED ORDER — ATORVASTATIN CALCIUM 80 MG PO TABS: 80.0000 mg | ORAL_TABLET | Freq: Every day | ORAL | 1 refills | Status: AC

## 2023-06-12 MED ORDER — AMLODIPINE BESYLATE 10 MG PO TABS: 10.0000 mg | ORAL_TABLET | Freq: Every day | ORAL | 1 refills | Status: DC

## 2023-06-12 MED ORDER — HEPARIN SODIUM (PORCINE) 1000 UNIT/ML IJ SOLN
INTRAMUSCULAR | Status: AC
  Filled 2023-06-12: qty 10

## 2023-06-12 MED ORDER — HEPARIN (PORCINE) IN NACL 2-0.9 UNITS/ML
INTRAMUSCULAR | Status: DC | PRN
  Administered 2023-06-12: 10 mL via INTRA_ARTERIAL

## 2023-06-12 MED ORDER — HYDRALAZINE HCL 20 MG/ML IJ SOLN: 10.0000 mg | INTRAMUSCULAR | Status: DC | PRN

## 2023-06-12 MED ORDER — MIDAZOLAM HCL 2 MG/2ML IJ SOLN
INTRAMUSCULAR | Status: DC | PRN
  Administered 2023-06-12: 2 mg via INTRAVENOUS

## 2023-06-12 MED ORDER — SODIUM CHLORIDE 0.9 % WEIGHT BASED INFUSION
3.0000 mL/kg/h | INTRAVENOUS | Status: DC
  Administered 2023-06-12: 3 mL/kg/h via INTRAVENOUS

## 2023-06-12 MED ORDER — SODIUM CHLORIDE 0.9 % WEIGHT BASED INFUSION: 3.0000 mL/kg/h | INTRAVENOUS | Status: DC

## 2023-06-12 SURGICAL SUPPLY — 8 items
CATH INFINITI AMBI 5FR TG (CATHETERS) IMPLANT
CATH INFINITI JR4 5F (CATHETERS) IMPLANT
DEVICE RAD COMP TR BAND LRG (VASCULAR PRODUCTS) IMPLANT
GLIDESHEATH SLEND SS 6F .021 (SHEATH) IMPLANT
GUIDEWIRE INQWIRE 1.5J.035X260 (WIRE) IMPLANT
PACK CARDIAC CATHETERIZATION (CUSTOM PROCEDURE TRAY) ×2 IMPLANT
SET ATX-X65L (MISCELLANEOUS) IMPLANT
SHEATH PROBE COVER 6X72 (BAG) IMPLANT

## 2023-06-12 NOTE — Brief Op Note (Signed)
 06/12/2023 1:03 PM  Primary Care Provider: Belenda Bowie [facility] Cardiologist: [name], MD  Referring Cardiologist: Peter Swazil[place], MD  PROCEDURE:  Procedure(s): LEFT HEART CATH AND CORONARY ANGIOGRAPHY (N/A)  SURGEON:  Surgeons and Role: * Arleen Lacer, MD - Primary   PATIENT:  Randall [name] very active 68 y.o. male with elevated Coronary Artery Calcium  Score, HTN, HLD, DM-2 who presented to [facility] with symptoms concerning for unstable angina.  Based on his elevated coronary calcium  score and concerning symptoms he is referred for cardiac catheterization today as well as an echocardiogram.  PRE-OPERATIVE DIAGNOSIS:  unstable angina; elevated Coronary Calcium  Score  POST-OPERATIVE DIAGNOSIS:   Moderate single-vessel disease with 60% RPDA stenosis and a codominant system. Normal LVEF with normal EDP  PROCEDURE PERFORMED Time Out: Verified patient identification, verified procedure, site/side was marked, verified correct patient position, special equipment/implants available, medications/allergies/relevent history reviewed, required imaging and test results available. Performed.  Access:  * Right Radial Artery: 6 Fr sheath -- Seldinger technique using Micropuncture Kit -- Direct ultrasound guidance used.  Permanent image obtained and placed on chart. -- 10 mL radial cocktail IA; 4000 units IV Heparin   Left Heart Catheterization: 5 Fr Catheters advanced or exchanged over a J-wire under direct fluoroscopic guidance into the ascending aorta; TIG 4.0 catheter advanced first.  * Left Coronary Artery Cineangiography: TIG 4.0 catheter  * LV Hemodynamics (LV Gram) & Right Coronary Artery Cineangiography JR4 catheter exit  Review of initial angiography revealed: Moderate single-vessel disease with 60% PDA lesion otherwise nonobstructive disease.  Preparations are made for medical management-likely noncardiac etiology for chest pain  Upon  completion of Angiogaphy, the catheter was removed completely out of the body over a wire, without complication.  Radial sheath removed in the Cardiac Catheterization lab with TR Band placed for hemostasis.  TR Band: 1300 Hours; 11 mL air; 1300  MEDICATIONS SQ Lidocaine  4 mL Radial Cocktail: 3 mg Verapmil in 10 mL NS Heparin: 4000 units  EBL:  <50 mL  PATIENT DISPOSITION:  PACU - hemodynamically stable.  COUNTS:  YES  DICTATION: .Note written in EPIC  PLAN OF CARE: Anticipate that he will be able to be discharged home today post bedrest. Continue GDMT    [name], MD

## 2023-06-12 NOTE — Plan of Care (Signed)

## 2023-06-12 NOTE — Interval H&P Note (Signed)
 History and Physical Interval Note:  06/12/2023 12:19 PM  Randall [name]  has presented today for surgery, with the diagnosis of unstable angina.  The various methods of treatment have been discussed with the patient and family. After consideration of risks, benefits and other options for treatment, the patient has consented to  Procedure(s): LEFT HEART CATH AND CORONARY ANGIOGRAPHY (N/A)  PERCUTANEOUS CORONARY INTERVENTION   as a surgical intervention.  The patient's history has been reviewed, patient examined, no change in status, stable for surgery.  I have reviewed the patient's chart and labs.  Questions were answered to the patient's satisfaction.    Cath Lab Visit (complete for each Cath Lab visit)  Clinical Evaluation Leading to the Procedure:   ACS: Yes.    Non-ACS:    Anginal Classification: CCS IV  Anti-ischemic medical therapy: Minimal Therapy (1 class of medications)  Non-Invasive Test Results: No non-invasive testing performed  Prior CABG: No previous CABG   Randall [name]

## 2023-06-12 NOTE — Progress Notes (Signed)
 Progress Note   Patient: Randall [name]:[id number] DOB: 1955-12-18 DOA: 06/10/2023     1 DOS: the patient was seen and examined on 06/12/2023   Brief hospital course: Randall [name] is a 68 y.o. male with medical history significant for SCC of throat, SP radiation, chronic neck lymphedema, inguinal hernia, HFpEF, HTN, type II DM, elevated coronary calcium  score 683, HLD, CKD stage II who presented to [facility] ED for evaluation of chest pain.  Patient reports he had some left arm pain on Thursday that was worst yesterday while at work.  He also had chest pain described as pressure on his chest that started yesterday but became worse today.  He reports associated left arm numbness and diaphoresis but has resolved.  He has chronic cough with occasional blood-tinged sputum due to his radiation treatments. He denies any nausea, vomiting, abdominal pain, dizziness, palpitations, fevers or chills.   ED Course: Initial vitals stable self hypertensive with SBP in the 170s to 190s.  Initial vitals significant for sodium 133, normal kidney function, WBC 5.7, Hgb 12.2, troponin 23->21, D-dimer 0.3.  EKG shows sinus rhythm, LVH, RBBB, LAFB but no ischemic changes. CXR with no active disease.  Patient received IV morphine 4 mg x 1, IV Toradol  50 mg x 1, IV Zofran  4 mg x 1. Cardiology was consulted and recommended admission for cardiac clearance due to pending hernia surgery. Patient admitted to [facility] service and transferred to [facility].  The patient has been evaluated by cardiology, and the plan is for [facility] on 06/12/2023.   On 06/11/2023 the patient was having ongoing chest pain. Cardiology was aware. He was given morphine for this pain that he characterized as 6/10 in severity, ache like across his chest and into his left arm.   Assessment and Plan: Randall [name] is a 68 y.o. male with medical history significant for SCC of throat s/p radiation, chronic neck lymphedema, inguinal hernia, HFpEF, HTN, type II  DM, elevated coronary calcium  score 683, HLD, CKD stage II who presented to [facility] ED for evaluation of chest pain and admitted for further evaluation.   Chest pain CAD - Presented with 2 days of worsening chest pain and left arm pain - EKG without ischemic changes, troponins mildly elevated but flat, 23->21 - Due to his elevated coronary calcium  score of 683, would likely require cardiac clearance prior to hernia surgery - Ongoing chest pain treated with NTG and morphine - Cardiology consulted, appreciate recs - Plan is for [facility] today.    Inguinal hernia - CT on 05/16/2023 showed small bilateral inguinal hernias containing fat - Patient is scheduled for hernia repair on June 5th - Continue home Hycet and as needed tramadol   HTN HFpEF - BP elevated with SBP in the 130s to 180s - Patient euvolemic on exam - Continue Coreg , telmisartan ( irbesartan  as substitute) - stop hydrochlorothiazide given hyponatremia, start amlodipine  Hyponatremia Likely 2/2 hydrochlorothiazide - stopping hydrochlorothiazide as above   T2DM - A1c of 5.2% 4 days ago - Sensitive sliding scale with meals, CBG monitoring   Hx of SCC of throat - s/p radiation and surgery, now surveilled   HLD - Continue atorvastatin    GERD - Continue Protonix   DVT prophylaxis: Lovenox       Code Status: Full Code   Consults called: Cardiology   Family Communication: wife at bedside 6/2       Subjective: currently no pain but did have some chest pressure earlier this morning  Physical  Exam: Vitals:   06/11/23 2029 06/12/23 0145 06/12/23 0413 06/12/23 0807  BP: (!) 161/77 (!) 149/98 (!) 145/79 (!) 167/88  Pulse: (!) 53 (!) 55 (!) 55 (!) 58  Resp: 19 20 19 18   Temp: 98.3 F (36.8 C) 98 F (36.7 C) 97.8 F (36.6 C) 97.6 F (36.4 C)  TempSrc: Oral Oral Oral Oral  SpO2: 100% 100% 100% 100%  Weight:      Height:       Exam:  Constitutional:  The patient is awake, alert, and oriented x 3.    Respiratory:  ctab Cardiovascular:  Rrr, no murmur Abdomen:  Abdomen is soft, non-tender, non-distended  Musculoskeletal:  No cyanosis, clubbing, or edema Skin:  No rashes, lesions, ulcers palpation of skin: no induration or nodules Neurologic:  Moving all 4 Psychiatric:  Mental status Mood, affect appropriate Orientation to person, place, time  judgment and insight appear intact  Data Reviewed:  Troponins EKG CBC CMP  Disposition: Status is: Inpatient Remains inpatient appropriate because: pending cath  Planned Discharge Destination: Home      Author: Raymonde Calico, MD 06/12/2023 10:15 AM  For on call review [email or web address].

## 2023-06-12 NOTE — Progress Notes (Signed)
*  PRELIMINARY RESULTS* Echocardiogram 2D Echocardiogram has been performed.  Randall [name] 06/12/2023, 12:09 PM

## 2023-06-12 NOTE — Discharge Summary (Addendum)
 Randall [name] ZOX:[id number] DOB: 11/13/1955 DOA: 06/10/2023  PCP: Margaret Sharp, PA-C  Admit date: 06/10/2023 Discharge date: 06/12/2023  Time spent: 35 minutes  Recommendations for Outpatient Follow-up:  Pcp f/u Cardiology f/u 2 weeks as scheduled     Discharge Diagnoses:  Principal Problem:   Chest pain Active Problems:   Severe hypertension   Type 2 diabetes mellitus with complication, without long-term current use of insulin  (HCC)   Pyriform sinus cancer (HCC)   ACS (acute coronary syndrome) (HCC)   Discharge Condition: stable  Diet recommendation: heart healthy  Filed Weights   06/10/23 1840  Weight: 75.8 kg    History of present illness:  From admission h and p Randall [name] is a 68 y.o. male with medical history significant for SCC of throat, SP radiation, chronic neck lymphedema, inguinal hernia, HFpEF, HTN, type II DM, elevated coronary calcium  score 683, HLD, CKD stage II who presented to [facility] for evaluation of chest pain.  Patient reports he had some left arm pain on Thursday that was worst yesterday while at work.  He also had chest pain described as pressure on his chest that started yesterday but became worse today.  He reports associated left arm numbness and diaphoresis but has resolved.  He has chronic cough with occasional blood-tinged sputum due to his radiation treatments. He denies any nausea, vomiting, abdominal pain, dizziness, palpitations, fevers or chills.   Hospital Course:  Patient presents with chest pain. Mild stable troponin elevation, symptoms concerning for unstable angina. Taken for [facility] on 6/2 with moderate single-vessel disease 60^ rdpa stenosis. TTE normal EF, grade 2 dd. No other dangerous etiology of chest pain identified, dimer wnl, cxr clear. Cleared for d/c by cardiology, will increase home statin. Sodium remains mildly low, no chf, will d/c home thiazide and substitute amlodipine.  Procedures: LHC    Consultations: cardiology  Discharge Exam: Vitals:   06/12/23 1328 06/12/23 1627  BP: (!) 143/59 (!) 144/61  Pulse: 61 64  Resp: 18 18  Temp: 97.6 F (36.4 C) 97.6 F (36.4 C)  SpO2: 99% 100%    General: NAD Cardiovascular: RRR Respiratory: CTAB  Discharge Instructions   Discharge Instructions     Diet - low sodium heart healthy   Complete by: As directed    Increase activity slowly   Complete by: As directed       Allergies as of 06/12/2023       Reactions   Norgesic Forte [orphenadrine-aspirin -caffeine] Other (See Comments)   Unknown reaction    Latex Hives, Rash   Blisters        Medication List     STOP taking these medications    BC HEADACHE POWDER PO   chlorthalidone  25 MG tablet Commonly known as: HYGROTON        TAKE these medications    amLODipine 10 MG tablet Commonly known as: NORVASC Take 1 tablet (10 mg total) by mouth daily. Start taking on: June 13, 2023   aspirin  EC 81 MG tablet Take 81 mg by mouth daily. Swallow whole.   atorvastatin  80 MG tablet Commonly known as: LIPITOR Take 1 tablet (80 mg total) by mouth daily. Start taking on: June 13, 2023 What changed:  medication strength how much to take   carvedilol  25 MG tablet Commonly known as: COREG  Take 25 mg by mouth 2 (two) times daily with a meal.   cetirizine 10 MG tablet Commonly known as: ZYRTEC Take 10 mg by mouth every evening.  HYDROcodone -acetaminophen  7.5-325 mg/15 ml solution Commonly known as: HYCET Take 10 mL up to TID as needed for pain - not to exceed 30mL daily. What changed:  how much to take how to take this when to take this additional instructions   loperamide 2 MG tablet Commonly known as: IMODIUM A-D Take 2 mg by mouth 3 (three) times daily as needed for diarrhea or loose stools.   meloxicam  15 MG tablet Commonly known as: MOBIC  Take 15 mg by mouth daily.   MULTIVITAMIN PO Take 1 tablet by mouth daily.   ondansetron  8 MG  tablet Commonly known as: ZOFRAN  Take 8 mg by mouth every 8 (eight) hours as needed for nausea or vomiting.   OVER THE COUNTER MEDICATION Take 30 mLs by mouth every 6 (six) hours as needed (Pain). PainQuil   oxymetazoline  0.05 % nasal spray Commonly known as: AFRIN Place 1 spray into both nostrils 2 (two) times daily as needed for congestion.   pantoprazole 40 MG tablet Commonly known as: PROTONIX Take 40 mg by mouth daily.   polyethylene glycol 17 g packet Commonly known as: MIRALAX / GLYCOLAX Take 17 g by mouth daily as needed for moderate constipation.   potassium chloride  10 MEQ CR capsule Commonly known as: MICRO-K  Take 10 mEq by mouth 2 (two) times daily.   PreviDent 5000 Enamel Protect 1.1-5 % Gel Generic drug: Sod Fluoride-Potassium Nitrate Place 1 application  onto teeth at bedtime.   tadalafil 20 MG tablet Commonly known as: CIALIS Take 20 mg by mouth daily as needed for erectile dysfunction.   telmisartan 80 MG tablet Commonly known as: MICARDIS Take 80 mg by mouth daily.   traMADol 50 MG tablet Commonly known as: ULTRAM Take 50 mg by mouth every 6 (six) hours as needed for moderate pain (pain score 4-6).   Trulicity 0.75 MG/0.5ML Soaj Generic drug: Dulaglutide Inject 0.75 mg into the skin once a week.       Allergies  Allergen Reactions   Norgesic Forte [Orphenadrine-Aspirin -Caffeine] Other (See Comments)    Unknown reaction    Latex Hives and Rash    Blisters    Follow-up Information     Margaret Sharp, PA-C Follow up.   Specialties: Family Medicine, Physician Assistant Contact information: [address] [place] [address] [phone]                  The results of significant diagnostics from this hospitalization (including imaging, microbiology, ancillary and laboratory) are listed below for reference.    Significant Diagnostic Studies: ECHOCARDIOGRAM COMPLETE Result Date: 06/12/2023    ECHOCARDIOGRAM REPORT   Patient  Name:   Randall [name] Date of Exam: 06/12/2023 Medical Rec #:  [id number]        Height:       70.0 in Accession #:    [id number]       Weight:       167.2 lb Date of Birth:  [birth year]        BSA:          1.934 m Patient Age:    68 years         BP:           168/88 mmHg Patient Gender: M                HR:           56 bpm. Exam Location:  Inpatient Procedure: 2D Echo, Cardiac Doppler and Color Doppler (Both  Spectral and Color            Flow Doppler were utilized during procedure). Indications:    AMI  History:        Patient has prior history of Echocardiogram examinations, most                 recent 02/09/2023. Risk Factors:Diabetes.  Sonographer:    Jeralene Mom Referring Phys: [id number] MATTHEW A CARLISLE IMPRESSIONS  1. Left ventricular ejection fraction, by estimation, is 55 to 60%. Left ventricular ejection fraction by 2D MOD biplane is 57.6 %. The left ventricle has normal function. The left ventricle demonstrates regional wall motion abnormalities with inferolateral hypokinesis. Left ventricular diastolic parameters are consistent with Grade II diastolic dysfunction (pseudonormalization).  2. Right ventricular systolic function is normal. The right ventricular size is normal. Tricuspid regurgitation signal is inadequate for assessing PA pressure.  3. Left atrial size was mildly dilated.  4. The mitral valve is normal in structure. Mild mitral valve regurgitation. No evidence of mitral stenosis.  5. The aortic valve is tricuspid. There is mild calcification of the aortic valve. Aortic valve regurgitation is not visualized. No aortic stenosis is present.  6. The inferior vena cava is dilated in size with <50% respiratory variability, suggesting right atrial pressure of 15 mmHg. FINDINGS  Left Ventricle: Left ventricular ejection fraction, by estimation, is 55 to 60%. Left ventricular ejection fraction by 2D MOD biplane is 57.6 %. The left ventricle has normal function. The left ventricle demonstrates  regional wall motion abnormalities. The left ventricular internal cavity size was normal in size. There is no left ventricular hypertrophy. Left ventricular diastolic parameters are consistent with Grade II diastolic dysfunction (pseudonormalization). Right Ventricle: The right ventricular size is normal. No increase in right ventricular wall thickness. Right ventricular systolic function is normal. Tricuspid regurgitation signal is inadequate for assessing PA pressure. Left Atrium: Left atrial size was mildly dilated. Right Atrium: Right atrial size was normal in size. Pericardium: There is no evidence of pericardial effusion. Mitral Valve: The mitral valve is normal in structure. Mild mitral valve regurgitation. No evidence of mitral valve stenosis. Tricuspid Valve: The tricuspid valve is normal in structure. Tricuspid valve regurgitation is not demonstrated. Aortic Valve: The aortic valve is tricuspid. There is mild calcification of the aortic valve. Aortic valve regurgitation is not visualized. No aortic stenosis is present. Aortic valve mean gradient measures 4.0 mmHg. Aortic valve peak gradient measures 8.6 mmHg. Aortic valve area, by VTI measures 2.58 cm. Pulmonic Valve: The pulmonic valve was normal in structure. Pulmonic valve regurgitation is not visualized. Aorta: The aortic root is normal in size and structure. Venous: The inferior vena cava is dilated in size with less than 50% respiratory variability, suggesting right atrial pressure of 15 mmHg. IAS/Shunts: No atrial level shunt detected by color flow Doppler.  LEFT VENTRICLE PLAX 2D                        Biplane EF (MOD) LVIDd:         4.70 cm         LV Biplane EF:   Left LVIDs:         3.80 cm                          ventricular LV PW:         0.80 cm  ejection LV IVS:        0.80 cm                          fraction by LVOT diam:     2.10 cm                          2D MOD LV SV:         81                                biplane is LV SV Index:   42                               57.6 %. LVOT Area:     3.46 cm                                Diastology                                LV e' medial:    6.85 cm/s LV Volumes (MOD)               LV E/e' medial:  12.0 LV vol d, MOD    151.0 ml      LV e' lateral:   8.16 cm/s A2C:                           LV E/e' lateral: 10.1 LV vol d, MOD    139.0 ml A4C: LV vol s, MOD    61.9 ml A2C: LV vol s, MOD    59.3 ml A4C: LV SV MOD A2C:   89.1 ml LV SV MOD A4C:   139.0 ml LV SV MOD BP:    84.5 ml LEFT ATRIUM           Index LA diam:      3.20 cm 1.65 cm/m LA Vol (A4C): 62.2 ml 32.16 ml/m  AORTIC VALVE AV Area (Vmax):    2.26 cm AV Area (Vmean):   2.34 cm AV Area (VTI):     2.58 cm AV Vmax:           147.00 cm/s AV Vmean:          95.300 cm/s AV VTI:            0.314 m AV Peak Grad:      8.6 mmHg AV Mean Grad:      4.0 mmHg LVOT Vmax:         96.10 cm/s LVOT Vmean:        64.300 cm/s LVOT VTI:          0.234 m LVOT/AV VTI ratio: 0.75  AORTA Ao Root diam: 2.90 cm Ao Asc diam:  3.10 cm MITRAL VALVE MV Area (PHT): 3.31 cm    SHUNTS MV Decel Time: 229 msec    Systemic VTI:  0.23 m MR Peak grad: 92.9 mmHg    Systemic Diam: 2.10 cm MR Vmax:      482.00 cm/s MV E velocity: 82.50 cm/s MV A velocity: 78.50 cm/s MV E/A ratio:  1.05 [employer]  signed by Archer Bear Signature Date/Time: 06/12/2023/2:48:05 PM    Final    CARDIAC CATHETERIZATION Result Date: 06/12/2023 Images from the original result were not included.   There is no aortic valve stenosis.   In the absence of any other complications or medical issues, we expect the patient to be ready for discharge from a cath perspective on 06/12/2023.   Recommend Aspirin  81mg  daily for moderate CAD. Diagnostic Dominance: Co-dominant POST-OPERATIVE DIAGNOSIS:  Moderate single-vessel disease with 60% RPDA stenosis and a codominant system. Normal LVEF with normal EDP RECOMMENDATIONS:    In the absence of any other complications or  medical issues, we expect the patient to be ready for discharge from a cath perspective on 06/12/2023.   Recommend Aspirin  81mg  daily for moderate CAD. [name], MD  DG Chest 2 View Result Date: 06/10/2023 CLINICAL DATA:  Chest pain and arm pain. EXAM: CHEST - 2 VIEW COMPARISON:  None Available. FINDINGS: The lungs are clear without focal pneumonia, edema, pneumothorax or pleural effusion. The cardiopericardial silhouette is within normal limits for size. No acute bony abnormality. IMPRESSION: No active cardiopulmonary disease. Electronically Signed   By: Donnal Fusi M.D.   On: 06/10/2023 13:25   CT ABDOMEN PELVIS W CONTRAST Result Date: 05/16/2023 CLINICAL DATA:  Inguinal hernia. EXAM: CT ABDOMEN AND PELVIS WITH CONTRAST TECHNIQUE: Multidetector CT imaging of the abdomen and pelvis was performed using the standard protocol following bolus administration of intravenous contrast. RADIATION DOSE REDUCTION: This exam was performed according to the departmental dose-optimization program which includes automated exposure control, adjustment of the mA and/or kV according to patient size and/or use of iterative reconstruction technique. CONTRAST:  OMNIPAQUE  IOHEXOL  300 MG/ML  SOLN COMPARISON:  03/02/2010. FINDINGS: Lower chest: No acute abnormality. No pleural or pericardial effusions. There is mild bilateral gynecomastia. Hepatobiliary: No focal liver abnormality is seen. No gallstones, gallbladder wall thickening, or biliary dilatation. Pancreas: Unremarkable. No pancreatic ductal dilatation or surrounding inflammatory changes. Spleen: Normal in size without focal abnormality. Adrenals/Urinary Tract: Adrenal glands are unremarkable. Kidneys are normal, without renal calculi, focal lesion, or hydronephrosis. Bladder is unremarkable. Stomach/Bowel: Stomach is within normal limits. Appendix appears normal. No evidence of bowel wall thickening, distention, or inflammatory changes. Vascular/Lymphatic: Aortic  atherosclerosis. No enlarged abdominal or pelvic lymph nodes. Reproductive: Prostate is unremarkable. Other: Small bilateral inguinal hernias containing fat. No obstruction or herniated bowel. Musculoskeletal: L5 compression deformity unchanged since 2012. IMPRESSION: 1. Small bilateral inguinal hernias containing fat. 2. Gynecomastia. 3. Aortic atherosclerosis (ICD10-I70.0). Electronically Signed   By: Sydell Eva M.D.   On: 05/16/2023 18:40    Microbiology: No results found for this or any previous visit (from the past 240 hours).   Labs: Basic Metabolic Panel: Recent Labs  Lab 06/07/23 1353 06/10/23 1242 06/11/23 0455 06/11/23 1044 06/12/23 0451  NA 131* 133* 133*  --  128*  K 3.9 3.8 3.1*  --  3.8  CL 97* 96* 102  --  99  CO2 26 25 21*  --  22  GLUCOSE 101* 137* 247*  --  101*  BUN 11 14 10   --  12  CREATININE 0.95 0.91 0.97  --  0.74  CALCIUM  9.2 10.1 8.9  --  8.3*  MG  --   --   --  1.7  --    Liver Function Tests: No results for input(s): "AST", "ALT", "ALKPHOS", "BILITOT", "PROT", "ALBUMIN" in the last 168 hours. No results for input(s): "LIPASE", "AMYLASE" in the last 168 hours. No  results for input(s): "AMMONIA" in the last 168 hours. CBC: Recent Labs  Lab 06/07/23 1353 06/10/23 1242 06/11/23 0455 06/12/23 0451  WBC 6.1 5.7 5.0 6.2  HGB 11.0* 12.2* 10.5* 10.3*  HCT 31.4* 35.9* 30.4* 30.4*  MCV 92.4 93.2 92.4 93.3  PLT 207 212 166 164   Cardiac Enzymes: No results for input(s): "CKTOTAL", "CKMB", "CKMBINDEX", "TROPONINI" in the last 168 hours. BNP: BNP (last 3 results) No results for input(s): "BNP" in the last 8760 hours.  ProBNP (last 3 results) No results for input(s): "PROBNP" in the last 8760 hours.  CBG: Recent Labs  Lab 06/11/23 1658 06/11/23 2112 06/12/23 0803 06/12/23 1324 06/12/23 1630  GLUCAP 107* 127* 103* 85 133*       Signed:  Raymonde Calico MD.  [facility] 06/12/2023, 5:11 PM

## 2023-06-12 NOTE — H&P (View-Only) (Signed)
 Rounding Note   Patient Name: Randall [name] Date of Encounter: 06/12/2023  [facility] Cardiologist: [name], MD   Subjective:   Reports some chest pain overnight. Improved this am.   Scheduled Meds:  aspirin  EC  81 mg Oral Daily   atorvastatin   80 mg Oral Daily   carvedilol   25 mg Oral BID WC   chlorthalidone   25 mg Oral Daily   HYDROcodone -acetaminophen   10 mL Oral BID   insulin  aspart  0-9 Units Subcutaneous TID WC   irbesartan   300 mg Oral Daily   multivitamin with minerals  1 tablet Oral Daily   naproxen  375 mg Oral TID WC   pantoprazole  40 mg Oral BID   potassium chloride   10 mEq Oral BID   Continuous Infusions:  heparin 1,200 Units/hr (06/12/23 0504)   PRN Meds: acetaminophen  **OR** acetaminophen , loperamide, morphine injection, ondansetron  **OR** ondansetron  (ZOFRAN ) IV, oxymetazoline , senna-docusate, traMADol   Vital Signs  Vitals:   06/11/23 2029 06/12/23 0145 06/12/23 0413 06/12/23 0807  BP: (!) 161/77 (!) 149/98 (!) 145/79 (!) 167/88  Pulse: (!) 53 (!) 55 (!) 55 (!) 58  Resp: 19 20 19 18   Temp: 98.3 F (36.8 C) 98 F (36.7 C) 97.8 F (36.6 C) 97.6 F (36.4 C)  TempSrc: Oral Oral Oral Oral  SpO2: 100% 100% 100% 100%  Weight:      Height:        Intake/Output Summary (Last 24 hours) at 06/12/2023 1004 Last data filed at 06/12/2023 0257 Gross per 24 hour  Intake 460.03 ml  Output --  Net 460.03 ml      06/10/2023    6:40 PM 06/07/2023    1:34 PM 05/24/2023   10:46 AM  Last 3 Weights  Weight (lbs) 167 lb 3.2 oz 176 lb 6.4 oz 175 lb 12.8 oz  Weight (kg) 75.841 kg 80.015 kg 79.742 kg      Telemetry NSR - Personally Reviewed  ECG  NSR with RBBB, LAFB.  - Personally Reviewed  Physical Exam  GEN: No acute distress.   Neck: No JVD Cardiac: RRR, no murmurs, rubs, or gallops.  Respiratory: Clear to auscultation bilaterally. GI: Soft, nontender, non-distended  MS: No edema; No deformity. Neuro:  Nonfocal  Psych: Normal  affect   Labs High Sensitivity Troponin:  No results for input(s): "TROPONINIHS" in the last 720 hours.   Chemistry Recent Labs  Lab 06/10/23 1242 06/11/23 0455 06/11/23 1044 06/12/23 0451  NA 133* 133*  --  128*  K 3.8 3.1*  --  3.8  CL 96* 102  --  99  CO2 25 21*  --  22  GLUCOSE 137* 247*  --  101*  BUN 14 10  --  12  CREATININE 0.91 0.97  --  0.74  CALCIUM  10.1 8.9  --  8.3*  MG  --   --  1.7  --   GFRNONAA >60 >60  --  >60  ANIONGAP 13 10  --  7    Lipids  Recent Labs  Lab 06/11/23 0737  CHOL 103  TRIG 32  HDL 43  LDLCALC 54  CHOLHDL 2.4    Hematology Recent Labs  Lab 06/10/23 1242 06/11/23 0455 06/12/23 0451  WBC 5.7 5.0 6.2  RBC 3.85* 3.29* 3.26*  HGB 12.2* 10.5* 10.3*  HCT 35.9* 30.4* 30.4*  MCV 93.2 92.4 93.3  MCH 31.7 31.9 31.6  MCHC 34.0 34.5 33.9  RDW 13.1 13.1 13.0  PLT 212 166  164   Thyroid   Recent Labs  Lab 06/11/23 0737  TSH 1.860    BNPNo results for input(s): "BNP", "PROBNP" in the last 168 hours.  DDimer  Recent Labs  Lab 06/10/23 1448  DDIMER 0.30     Radiology  DG Chest 2 View Result Date: 06/10/2023 CLINICAL DATA:  Chest pain and arm pain. EXAM: CHEST - 2 VIEW COMPARISON:  None Available. FINDINGS: The lungs are clear without focal pneumonia, edema, pneumothorax or pleural effusion. The cardiopericardial silhouette is within normal limits for size. No acute bony abnormality. IMPRESSION: No active cardiopulmonary disease. Electronically Signed   By: Donnal Fusi M.D.   On: 06/10/2023 13:25    Cardiac Studies N/a  Patient Profile   68 y.o. 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   Assessment & Plan  CAD with USAP Mr. Blahnik has a convincing story for unstable angina. hsT flat (23-21). RF for CAD include CAC score of 683, DM2, HTN and  HLD. He doesn't have a tobacco or etoh history. He has coronary calcifications on prior CT scans. His sx were all at rest and were unchanged with activity.  - s/p ASA, continue ASA 81 mg PO daily - increase OP atorva 20 mg daily to 80 mg daily  - on  heparin gtt - TTE ordered - plan for cath today.  The procedure and risks were reviewed including but not limited to death, myocardial infarction, stroke, arrythmias, bleeding, transfusion, emergency surgery, dye allergy, or renal dysfunction. The patient voices understanding and is agreeable to proceed. t   For questions or updates, please contact [facility] Please consult [email or web address] for contact info under     Signed, Pharaoh Pio [place], MD  06/12/2023, 10:04 AM

## 2023-06-12 NOTE — Progress Notes (Signed)
 PHARMACY - ANTICOAGULATION CONSULT NOTE  Pharmacy Consult for Heparin Indication: chest pain/ACS  Allergies  Allergen Reactions   Norgesic Forte [Orphenadrine-Aspirin -Caffeine] Other (See Comments)    Unknown reaction    Latex Hives and Rash    Blisters    Patient Measurements: Height: 5\' 10"  (177.8 cm) Weight: 75.8 kg (167 lb 3.2 oz) (shoes on and pockets full) IBW/kg (Calculated) : 73 HEPARIN DW (KG): 75.8  Vital Signs: Temp: 98.3 F (36.8 C) (06/01 1659) Temp Source: Oral (06/01 1659) BP: 161/77 (06/01 2029) Pulse Rate: 53 (06/01 2029)  Labs: Recent Labs    06/10/23 1242 06/11/23 0455 06/11/23 0815 06/11/23 1619 06/11/23 2341  HGB 12.2* 10.5*  --   --   --   HCT 35.9* 30.4*  --   --   --   PLT 212 166  --   --   --   HEPARINUNFRC  --   --  0.13* 0.20* 0.24*  CREATININE 0.91 0.97  --   --   --     Estimated Creatinine Clearance: 76.3 mL/min (by C-G formula based on SCr of 0.97 mg/dL).  Assessment: 80 YOM who presented for evaluation of chest pain. Reports that he had L arm pain on Thursday that worsened at work. Additionally, reports chest pain that worsened yesterday. Cardiology has been consulted for evaluation and cardiac clearance given patient is pending a hernia surgery. No anticoagulation PTA. Pharmacy consulted to dose heparin in setting of unstable angina.    Patient was on enoxaparin  for DVT prophylaxis and received last dose on 5/31 at 2315. Hgb 10.5, PLT 166. Patient dose have history of blood-tinged sputum, so will continue to monitor while on heparin.  3rd shift: heparin level remains subtherapeutic (0.24) on 1050 units/hr. No infusion issues or bleeding per RN.    Goal of Therapy:  Heparin level 0.3-0.7 units/ml Monitor platelets by anticoagulation protocol: Yes   Plan:  Increase heparin drip to 1200 units/hr. Heparin level ~8 hrs after rate change. Daily heparin level and CBC while on heparin.  Adolphus Akin, RPh 06/12/2023,12:36  AM

## 2023-06-12 NOTE — Plan of Care (Signed)
 Problem: Education: Goal: Knowledge of General Education information will improve Description: Including pain rating scale, medication(s)/side effects and non-pharmacologic comfort measures 06/12/2023 1711 by Ritta Chessman, RN Outcome: Adequate for Discharge 06/12/2023 1711 by Ritta Chessman, RN Outcome: Adequate for Discharge 06/12/2023 1331 by Ritta Chessman, RN Outcome: Progressing   Problem: Health Behavior/Discharge Planning: Goal: Ability to manage health-related needs will improve 06/12/2023 1711 by Ritta Chessman, RN Outcome: Adequate for Discharge 06/12/2023 1711 by Ritta Chessman, RN Outcome: Adequate for Discharge 06/12/2023 1331 by Ritta Chessman, RN Outcome: Progressing   Problem: Clinical Measurements: Goal: Ability to maintain clinical measurements within normal limits will improve 06/12/2023 1711 by Ritta Chessman, RN Outcome: Adequate for Discharge 06/12/2023 1711 by Ritta Chessman, RN Outcome: Adequate for Discharge 06/12/2023 1331 by Ritta Chessman, RN Outcome: Progressing Goal: Will remain free from infection 06/12/2023 1711 by Ritta Chessman, RN Outcome: Adequate for Discharge 06/12/2023 1711 by Ritta Chessman, RN Outcome: Adequate for Discharge 06/12/2023 1331 by Ritta Chessman, RN Outcome: Progressing Goal: Diagnostic test results will improve 06/12/2023 1711 by Ritta Chessman, RN Outcome: Adequate for Discharge 06/12/2023 1711 by Ritta Chessman, RN Outcome: Adequate for Discharge 06/12/2023 1331 by Ritta Chessman, RN Outcome: Progressing Goal: Respiratory complications will improve 06/12/2023 1711 by Ritta Chessman, RN Outcome: Adequate for Discharge 06/12/2023 1711 by Ritta Chessman, RN Outcome: Adequate for Discharge 06/12/2023 1331 by Ritta Chessman, RN Outcome: Progressing Goal: Cardiovascular complication will be avoided 06/12/2023 1711 by Ritta Chessman,  RN Outcome: Adequate for Discharge 06/12/2023 1711 by Ritta Chessman, RN Outcome: Adequate for Discharge 06/12/2023 1331 by Ritta Chessman, RN Outcome: Progressing   Problem: Activity: Goal: Risk for activity intolerance will decrease 06/12/2023 1711 by Ritta Chessman, RN Outcome: Adequate for Discharge 06/12/2023 1711 by Ritta Chessman, RN Outcome: Adequate for Discharge 06/12/2023 1331 by Ritta Chessman, RN Outcome: Progressing   Problem: Nutrition: Goal: Adequate nutrition will be maintained 06/12/2023 1711 by Ritta Chessman, RN Outcome: Adequate for Discharge 06/12/2023 1711 by Ritta Chessman, RN Outcome: Adequate for Discharge 06/12/2023 1331 by Ritta Chessman, RN Outcome: Progressing   Problem: Coping: Goal: Level of anxiety will decrease 06/12/2023 1711 by Ritta Chessman, RN Outcome: Adequate for Discharge 06/12/2023 1711 by Ritta Chessman, RN Outcome: Adequate for Discharge 06/12/2023 1331 by Ritta Chessman, RN Outcome: Progressing   Problem: Elimination: Goal: Will not experience complications related to bowel motility 06/12/2023 1711 by Ritta Chessman, RN Outcome: Adequate for Discharge 06/12/2023 1711 by Ritta Chessman, RN Outcome: Adequate for Discharge 06/12/2023 1331 by Ritta Chessman, RN Outcome: Progressing Goal: Will not experience complications related to urinary retention 06/12/2023 1711 by Ritta Chessman, RN Outcome: Adequate for Discharge 06/12/2023 1711 by Ritta Chessman, RN Outcome: Adequate for Discharge 06/12/2023 1331 by Ritta Chessman, RN Outcome: Progressing   Problem: Pain Managment: Goal: General experience of comfort will improve and/or be controlled 06/12/2023 1711 by Ritta Chessman, RN Outcome: Adequate for Discharge 06/12/2023 1711 by Ritta Chessman, RN Outcome: Adequate for Discharge 06/12/2023 1331 by Ritta Chessman, RN Outcome: Progressing    Problem: Safety: Goal: Ability to remain free from injury will improve 06/12/2023 1711 by Ritta Chessman, RN Outcome: Adequate for Discharge 06/12/2023 1711 by Ritta Chessman, RN Outcome: Adequate for Discharge 06/12/2023 1331 by Ritta Chessman, RN Outcome: Progressing   Problem: Skin Integrity: Goal: Risk for impaired  skin integrity will decrease 06/12/2023 1711 by Ritta Chessman, RN Outcome: Adequate for Discharge 06/12/2023 1711 by Ritta Chessman, RN Outcome: Adequate for Discharge 06/12/2023 1331 by Ritta Chessman, RN Outcome: Progressing   Problem: Education: Goal: Ability to describe self-care measures that may prevent or decrease complications (Diabetes Survival Skills Education) will improve 06/12/2023 1711 by Ritta Chessman, RN Outcome: Adequate for Discharge 06/12/2023 1711 by Ritta Chessman, RN Outcome: Adequate for Discharge 06/12/2023 1331 by Ritta Chessman, RN Outcome: Progressing Goal: Individualized Educational Video(s) 06/12/2023 1711 by Ritta Chessman, RN Outcome: Adequate for Discharge 06/12/2023 1711 by Ritta Chessman, RN Outcome: Adequate for Discharge 06/12/2023 1331 by Ritta Chessman, RN Outcome: Progressing   Problem: Coping: Goal: Ability to adjust to condition or change in health will improve 06/12/2023 1711 by Ritta Chessman, RN Outcome: Adequate for Discharge 06/12/2023 1711 by Ritta Chessman, RN Outcome: Adequate for Discharge 06/12/2023 1331 by Ritta Chessman, RN Outcome: Progressing   Problem: Fluid Volume: Goal: Ability to maintain a balanced intake and output will improve 06/12/2023 1711 by Ritta Chessman, RN Outcome: Adequate for Discharge 06/12/2023 1711 by Ritta Chessman, RN Outcome: Adequate for Discharge 06/12/2023 1331 by Ritta Chessman, RN Outcome: Progressing   Problem: Health Behavior/Discharge Planning: Goal: Ability to identify and utilize  available resources and services will improve 06/12/2023 1711 by Ritta Chessman, RN Outcome: Adequate for Discharge 06/12/2023 1711 by Ritta Chessman, RN Outcome: Adequate for Discharge 06/12/2023 1331 by Ritta Chessman, RN Outcome: Progressing Goal: Ability to manage health-related needs will improve 06/12/2023 1711 by Ritta Chessman, RN Outcome: Adequate for Discharge 06/12/2023 1711 by Ritta Chessman, RN Outcome: Adequate for Discharge 06/12/2023 1331 by Ritta Chessman, RN Outcome: Progressing   Problem: Metabolic: Goal: Ability to maintain appropriate glucose levels will improve 06/12/2023 1711 by Ritta Chessman, RN Outcome: Adequate for Discharge 06/12/2023 1711 by Ritta Chessman, RN Outcome: Adequate for Discharge 06/12/2023 1331 by Ritta Chessman, RN Outcome: Progressing   Problem: Nutritional: Goal: Maintenance of adequate nutrition will improve 06/12/2023 1711 by Ritta Chessman, RN Outcome: Adequate for Discharge 06/12/2023 1711 by Ritta Chessman, RN Outcome: Adequate for Discharge 06/12/2023 1331 by Ritta Chessman, RN Outcome: Progressing Goal: Progress toward achieving an optimal weight will improve 06/12/2023 1711 by Ritta Chessman, RN Outcome: Adequate for Discharge 06/12/2023 1711 by Ritta Chessman, RN Outcome: Adequate for Discharge 06/12/2023 1331 by Ritta Chessman, RN Outcome: Progressing   Problem: Skin Integrity: Goal: Risk for impaired skin integrity will decrease 06/12/2023 1711 by Ritta Chessman, RN Outcome: Adequate for Discharge 06/12/2023 1711 by Ritta Chessman, RN Outcome: Adequate for Discharge 06/12/2023 1331 by Ritta Chessman, RN Outcome: Progressing   Problem: Tissue Perfusion: Goal: Adequacy of tissue perfusion will improve 06/12/2023 1711 by Ritta Chessman, RN Outcome: Adequate for Discharge 06/12/2023 1711 by Ritta Chessman, RN Outcome: Adequate for  Discharge 06/12/2023 1331 by Ritta Chessman, RN Outcome: Progressing   Problem: Education: Goal: Understanding of CV disease, CV risk reduction, and recovery process will improve 06/12/2023 1711 by Ritta Chessman, RN Outcome: Adequate for Discharge 06/12/2023 1711 by Ritta Chessman, RN Outcome: Adequate for Discharge 06/12/2023 1331 by Ritta Chessman, RN Outcome: Progressing Goal: Individualized Educational Video(s) 06/12/2023 1711 by Ritta Chessman, RN Outcome: Adequate for Discharge 06/12/2023 1711 by Ritta Chessman, RN Outcome: Adequate for Discharge 06/12/2023 1331 by Ritta Chessman,  RN Outcome: Progressing   Problem: Activity: Goal: Ability to return to baseline activity level will improve 06/12/2023 1711 by Ritta Chessman, RN Outcome: Adequate for Discharge 06/12/2023 1711 by Ritta Chessman, RN Outcome: Adequate for Discharge 06/12/2023 1331 by Ritta Chessman, RN Outcome: Progressing   Problem: Cardiovascular: Goal: Ability to achieve and maintain adequate cardiovascular perfusion will improve 06/12/2023 1711 by Ritta Chessman, RN Outcome: Adequate for Discharge 06/12/2023 1711 by Ritta Chessman, RN Outcome: Adequate for Discharge 06/12/2023 1331 by Ritta Chessman, RN Outcome: Progressing Goal: Vascular access site(s) Level 0-1 will be maintained 06/12/2023 1711 by Ritta Chessman, RN Outcome: Adequate for Discharge 06/12/2023 1711 by Ritta Chessman, RN Outcome: Adequate for Discharge 06/12/2023 1331 by Ritta Chessman, RN Outcome: Progressing   Problem: Health Behavior/Discharge Planning: Goal: Ability to safely manage health-related needs after discharge will improve 06/12/2023 1711 by Ritta Chessman, RN Outcome: Adequate for Discharge 06/12/2023 1711 by Ritta Chessman, RN Outcome: Adequate for Discharge 06/12/2023 1331 by Ritta Chessman, RN Outcome: Progressing

## 2023-06-12 NOTE — Plan of Care (Signed)

## 2023-06-12 NOTE — Progress Notes (Signed)
 Rounding Note   Patient Name: Randall [name] Date of Encounter: 06/12/2023  [facility] Cardiologist: [name], MD   Subjective:   Reports some chest pain overnight. Improved this am.   Scheduled Meds:  aspirin  EC  81 mg Oral Daily   atorvastatin   80 mg Oral Daily   carvedilol   25 mg Oral BID WC   chlorthalidone   25 mg Oral Daily   HYDROcodone -acetaminophen   10 mL Oral BID   insulin  aspart  0-9 Units Subcutaneous TID WC   irbesartan   300 mg Oral Daily   multivitamin with minerals  1 tablet Oral Daily   naproxen  375 mg Oral TID WC   pantoprazole  40 mg Oral BID   potassium chloride   10 mEq Oral BID   Continuous Infusions:  heparin 1,200 Units/hr (06/12/23 0504)   PRN Meds: acetaminophen  **OR** acetaminophen , loperamide, morphine injection, ondansetron  **OR** ondansetron  (ZOFRAN ) IV, oxymetazoline , senna-docusate, traMADol   Vital Signs  Vitals:   06/11/23 2029 06/12/23 0145 06/12/23 0413 06/12/23 0807  BP: (!) 161/77 (!) 149/98 (!) 145/79 (!) 167/88  Pulse: (!) 53 (!) 55 (!) 55 (!) 58  Resp: 19 20 19 18   Temp: 98.3 F (36.8 C) 98 F (36.7 C) 97.8 F (36.6 C) 97.6 F (36.4 C)  TempSrc: Oral Oral Oral Oral  SpO2: 100% 100% 100% 100%  Weight:      Height:        Intake/Output Summary (Last 24 hours) at 06/12/2023 1004 Last data filed at 06/12/2023 0257 Gross per 24 hour  Intake 460.03 ml  Output --  Net 460.03 ml      06/10/2023    6:40 PM 06/07/2023    1:34 PM 05/24/2023   10:46 AM  Last 3 Weights  Weight (lbs) 167 lb 3.2 oz 176 lb 6.4 oz 175 lb 12.8 oz  Weight (kg) 75.841 kg 80.015 kg 79.742 kg      Telemetry NSR - Personally Reviewed  ECG  NSR with RBBB, LAFB.  - Personally Reviewed  Physical Exam  GEN: No acute distress.   Neck: No JVD Cardiac: RRR, no murmurs, rubs, or gallops.  Respiratory: Clear to auscultation bilaterally. GI: Soft, nontender, non-distended  MS: No edema; No deformity. Neuro:  Nonfocal  Psych: Normal  affect   Labs High Sensitivity Troponin:  No results for input(s): "TROPONINIHS" in the last 720 hours.   Chemistry Recent Labs  Lab 06/10/23 1242 06/11/23 0455 06/11/23 1044 06/12/23 0451  NA 133* 133*  --  128*  K 3.8 3.1*  --  3.8  CL 96* 102  --  99  CO2 25 21*  --  22  GLUCOSE 137* 247*  --  101*  BUN 14 10  --  12  CREATININE 0.91 0.97  --  0.74  CALCIUM  10.1 8.9  --  8.3*  MG  --   --  1.7  --   GFRNONAA >60 >60  --  >60  ANIONGAP 13 10  --  7    Lipids  Recent Labs  Lab 06/11/23 0737  CHOL 103  TRIG 32  HDL 43  LDLCALC 54  CHOLHDL 2.4    Hematology Recent Labs  Lab 06/10/23 1242 06/11/23 0455 06/12/23 0451  WBC 5.7 5.0 6.2  RBC 3.85* 3.29* 3.26*  HGB 12.2* 10.5* 10.3*  HCT 35.9* 30.4* 30.4*  MCV 93.2 92.4 93.3  MCH 31.7 31.9 31.6  MCHC 34.0 34.5 33.9  RDW 13.1 13.1 13.0  PLT 212 166  164   Thyroid   Recent Labs  Lab 06/11/23 0737  TSH 1.860    BNPNo results for input(s): "BNP", "PROBNP" in the last 168 hours.  DDimer  Recent Labs  Lab 06/10/23 1448  DDIMER 0.30     Radiology  DG Chest 2 View Result Date: 06/10/2023 CLINICAL DATA:  Chest pain and arm pain. EXAM: CHEST - 2 VIEW COMPARISON:  None Available. FINDINGS: The lungs are clear without focal pneumonia, edema, pneumothorax or pleural effusion. The cardiopericardial silhouette is within normal limits for size. No acute bony abnormality. IMPRESSION: No active cardiopulmonary disease. Electronically Signed   By: Donnal Fusi M.D.   On: 06/10/2023 13:25    Cardiac Studies N/a  Patient Profile   68 y.o. 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   Assessment & Plan  CAD with USAP Mr. Blahnik has a convincing story for unstable angina. hsT flat (23-21). RF for CAD include CAC score of 683, DM2, HTN and  HLD. He doesn't have a tobacco or etoh history. He has coronary calcifications on prior CT scans. His sx were all at rest and were unchanged with activity.  - s/p ASA, continue ASA 81 mg PO daily - increase OP atorva 20 mg daily to 80 mg daily  - on  heparin gtt - TTE ordered - plan for cath today.  The procedure and risks were reviewed including but not limited to death, myocardial infarction, stroke, arrythmias, bleeding, transfusion, emergency surgery, dye allergy, or renal dysfunction. The patient voices understanding and is agreeable to proceed. t   For questions or updates, please contact [facility] Please consult [email or web address] for contact info under     Signed, Pharaoh Pio [place], MD  06/12/2023, 10:04 AM

## 2023-06-15 ENCOUNTER — Encounter (HOSPITAL_COMMUNITY)
Admission: RE | Disposition: A | Discharge status: Home / Self Care | Payer: Self-pay | Source: Ambulatory Visit | Attending: General Surgery

## 2023-06-15 ENCOUNTER — Ambulatory Visit (HOSPITAL_COMMUNITY)
Admission: RE | Admit: 2023-06-15 | Discharge: 2023-06-15 | Disposition: A | Discharge status: Home / Self Care | Source: Ambulatory Visit | Attending: General Surgery | Admitting: General Surgery

## 2023-06-15 ENCOUNTER — Other Ambulatory Visit: Payer: Self-pay

## 2023-06-15 ENCOUNTER — Ambulatory Visit (HOSPITAL_COMMUNITY): Payer: Self-pay | Admitting: Physician Assistant

## 2023-06-15 ENCOUNTER — Ambulatory Visit (HOSPITAL_COMMUNITY): Payer: Self-pay | Admitting: Anesthesiology

## 2023-06-15 ENCOUNTER — Encounter (HOSPITAL_COMMUNITY): Payer: Self-pay | Admitting: General Surgery

## 2023-06-15 DIAGNOSIS — I130 Hypertensive heart and chronic kidney disease with heart failure and stage 1 through stage 4 chronic kidney disease, or unspecified chronic kidney disease: Secondary | ICD-10-CM | POA: Diagnosis not present

## 2023-06-15 DIAGNOSIS — I509 Heart failure, unspecified: Secondary | ICD-10-CM | POA: Insufficient documentation

## 2023-06-15 DIAGNOSIS — Z7984 Long term (current) use of oral hypoglycemic drugs: Secondary | ICD-10-CM | POA: Insufficient documentation

## 2023-06-15 DIAGNOSIS — Z7985 Long-term (current) use of injectable non-insulin antidiabetic drugs: Secondary | ICD-10-CM | POA: Insufficient documentation

## 2023-06-15 DIAGNOSIS — N182 Chronic kidney disease, stage 2 (mild): Secondary | ICD-10-CM | POA: Diagnosis not present

## 2023-06-15 DIAGNOSIS — K4020 Bilateral inguinal hernia, without obstruction or gangrene, not specified as recurrent: Secondary | ICD-10-CM | POA: Insufficient documentation

## 2023-06-15 DIAGNOSIS — E1122 Type 2 diabetes mellitus with diabetic chronic kidney disease: Secondary | ICD-10-CM | POA: Insufficient documentation

## 2023-06-15 DIAGNOSIS — E118 Type 2 diabetes mellitus with unspecified complications: Secondary | ICD-10-CM

## 2023-06-15 DIAGNOSIS — K219 Gastro-esophageal reflux disease without esophagitis: Secondary | ICD-10-CM | POA: Diagnosis not present

## 2023-06-15 DIAGNOSIS — I2510 Atherosclerotic heart disease of native coronary artery without angina pectoris: Secondary | ICD-10-CM | POA: Diagnosis not present

## 2023-06-15 HISTORY — PX: INGUINAL HERNIA REPAIR: SHX194

## 2023-06-15 LAB — GLUCOSE, CAPILLARY
Glucose-Capillary: 112 mg/dL — ABNORMAL HIGH (ref 70–99)
Glucose-Capillary: 170 mg/dL — ABNORMAL HIGH (ref 70–99)

## 2023-06-15 SURGERY — REPAIR, HERNIA, INGUINAL, LAPAROSCOPIC
Anesthesia: General | Site: Inguinal | Laterality: Bilateral

## 2023-06-15 MED ORDER — ROCURONIUM BROMIDE 10 MG/ML (PF) SYRINGE
PREFILLED_SYRINGE | INTRAVENOUS | Status: DC | PRN
  Administered 2023-06-15: 50 mg via INTRAVENOUS

## 2023-06-15 MED ORDER — TRAMADOL HCL 50 MG PO TABS: 50.0000 mg | ORAL_TABLET | Freq: Four times a day (QID) | ORAL | 0 refills | Status: DC | PRN

## 2023-06-15 MED ORDER — ACETAMINOPHEN 500 MG PO TABS: 1000.0000 mg | ORAL_TABLET | Freq: Once | ORAL | Status: DC

## 2023-06-15 MED ORDER — FENTANYL CITRATE (PF) 250 MCG/5ML IJ SOLN
INTRAMUSCULAR | Status: DC | PRN
  Administered 2023-06-15: 100 ug via INTRAVENOUS
  Administered 2023-06-15: 25 ug via INTRAVENOUS

## 2023-06-15 MED ORDER — CHLORHEXIDINE GLUCONATE CLOTH 2 % EX PADS: 6.0000 | MEDICATED_PAD | Freq: Once | CUTANEOUS | Status: DC

## 2023-06-15 MED ORDER — PROPOFOL 10 MG/ML IV BOLUS
INTRAVENOUS | Status: AC
  Filled 2023-06-15: qty 20

## 2023-06-15 MED ORDER — FENTANYL CITRATE (PF) 100 MCG/2ML IJ SOLN
25.0000 ug | INTRAMUSCULAR | Status: DC | PRN
  Administered 2023-06-15 (×2): 50 ug via INTRAVENOUS

## 2023-06-15 MED ORDER — ORAL CARE MOUTH RINSE: 15.0000 mL | Freq: Once | OROMUCOSAL | Status: AC

## 2023-06-15 MED ORDER — EPHEDRINE SULFATE-NACL 50-0.9 MG/10ML-% IV SOSY
PREFILLED_SYRINGE | INTRAVENOUS | Status: DC | PRN
  Administered 2023-06-15 (×2): 5 mg via INTRAVENOUS

## 2023-06-15 MED ORDER — ENSURE PRE-SURGERY PO LIQD: 296.0000 mL | Freq: Once | ORAL | Status: DC

## 2023-06-15 MED ORDER — FENTANYL CITRATE (PF) 250 MCG/5ML IJ SOLN
INTRAMUSCULAR | Status: AC
  Filled 2023-06-15: qty 5

## 2023-06-15 MED ORDER — ONDANSETRON HCL 4 MG/2ML IJ SOLN
INTRAMUSCULAR | Status: DC | PRN
  Administered 2023-06-15: 4 mg via INTRAVENOUS

## 2023-06-15 MED ORDER — OXYCODONE HCL 5 MG PO TABS
5.0000 mg | ORAL_TABLET | Freq: Once | ORAL | Status: AC | PRN
  Administered 2023-06-15: 5 mg via ORAL

## 2023-06-15 MED ORDER — CHLORHEXIDINE GLUCONATE 0.12 % MT SOLN
15.0000 mL | Freq: Once | OROMUCOSAL | Status: AC
  Administered 2023-06-15: 15 mL via OROMUCOSAL
  Filled 2023-06-15: qty 15

## 2023-06-15 MED ORDER — LIDOCAINE 2% (20 MG/ML) 5 ML SYRINGE
INTRAMUSCULAR | Status: DC | PRN
  Administered 2023-06-15: 100 mg via INTRAVENOUS

## 2023-06-15 MED ORDER — MIDAZOLAM HCL 2 MG/2ML IJ SOLN
INTRAMUSCULAR | Status: DC | PRN
  Administered 2023-06-15: 2 mg via INTRAVENOUS

## 2023-06-15 MED ORDER — LACTATED RINGERS IV SOLN: INTRAVENOUS | Status: DC

## 2023-06-15 MED ORDER — MIDAZOLAM HCL 2 MG/2ML IJ SOLN
INTRAMUSCULAR | Status: AC
  Filled 2023-06-15: qty 2

## 2023-06-15 MED ORDER — PHENYLEPHRINE HCL-NACL 20-0.9 MG/250ML-% IV SOLN
INTRAVENOUS | Status: DC | PRN
  Administered 2023-06-15: 30 ug/min via INTRAVENOUS

## 2023-06-15 MED ORDER — SUGAMMADEX SODIUM 200 MG/2ML IV SOLN
INTRAVENOUS | Status: DC | PRN
  Administered 2023-06-15: 200 mg via INTRAVENOUS

## 2023-06-15 MED ORDER — CEFAZOLIN SODIUM-DEXTROSE 2-4 GM/100ML-% IV SOLN
2.0000 g | INTRAVENOUS | Status: AC
  Administered 2023-06-15: 2 g via INTRAVENOUS
  Filled 2023-06-15: qty 100

## 2023-06-15 MED ORDER — PROPOFOL 10 MG/ML IV BOLUS
INTRAVENOUS | Status: DC | PRN
  Administered 2023-06-15: 140 mg via INTRAVENOUS

## 2023-06-15 MED ORDER — DEXAMETHASONE SODIUM PHOSPHATE 10 MG/ML IJ SOLN
INTRAMUSCULAR | Status: DC | PRN
  Administered 2023-06-15: 5 mg via INTRAVENOUS

## 2023-06-15 MED ORDER — OXYCODONE HCL 5 MG PO TABS
ORAL_TABLET | ORAL | Status: AC
Start: 2023-06-15 — End: ?
  Filled 2023-06-15: qty 1

## 2023-06-15 MED ORDER — FENTANYL CITRATE (PF) 100 MCG/2ML IJ SOLN
INTRAMUSCULAR | Status: AC
  Filled 2023-06-15: qty 2

## 2023-06-15 MED ORDER — DROPERIDOL 2.5 MG/ML IJ SOLN: 0.6250 mg | Freq: Once | INTRAMUSCULAR | Status: DC | PRN

## 2023-06-15 MED ORDER — PHENYLEPHRINE 80 MCG/ML (10ML) SYRINGE FOR IV PUSH (FOR BLOOD PRESSURE SUPPORT)
PREFILLED_SYRINGE | INTRAVENOUS | Status: DC | PRN
  Administered 2023-06-15: 80 ug via INTRAVENOUS
  Administered 2023-06-15: 160 ug via INTRAVENOUS

## 2023-06-15 MED ORDER — OXYCODONE HCL 5 MG/5ML PO SOLN: 5.0000 mg | Freq: Once | ORAL | Status: AC | PRN

## 2023-06-15 MED ORDER — BUPIVACAINE HCL (PF) 0.25 % IJ SOLN
INTRAMUSCULAR | Status: AC
  Filled 2023-06-15: qty 30

## 2023-06-15 MED ORDER — INSULIN ASPART 100 UNIT/ML IJ SOLN: 0.0000 [IU] | INTRAMUSCULAR | Status: DC | PRN

## 2023-06-15 MED ORDER — 0.9 % SODIUM CHLORIDE (POUR BTL) OPTIME
TOPICAL | Status: DC | PRN
  Administered 2023-06-15: 1000 mL

## 2023-06-15 MED ORDER — ACETAMINOPHEN 500 MG PO TABS: 1000.0000 mg | ORAL_TABLET | ORAL | Status: DC

## 2023-06-15 MED ORDER — BUPIVACAINE HCL 0.25 % IJ SOLN
INTRAMUSCULAR | Status: DC | PRN
  Administered 2023-06-15: 8 mL

## 2023-06-15 SURGICAL SUPPLY — 30 items
BAG COUNTER SPONGE SURGICOUNT (BAG) ×2 IMPLANT
COVER SURGICAL LIGHT HANDLE (MISCELLANEOUS) ×2 IMPLANT
DERMABOND ADVANCED .7 DNX12 (GAUZE/BANDAGES/DRESSINGS) ×2 IMPLANT
ELECTRODE REM PT RTRN 9FT ADLT (ELECTROSURGICAL) ×2 IMPLANT
GLOVE SKINSENSE STRL SZ7.5 (GLOVE) IMPLANT
GOWN STRL REUS W/ TWL LRG LVL3 (GOWN DISPOSABLE) ×4 IMPLANT
GOWN STRL REUS W/ TWL XL LVL3 (GOWN DISPOSABLE) ×2 IMPLANT
KIT BASIN OR (CUSTOM PROCEDURE TRAY) ×2 IMPLANT
KIT TURNOVER KIT B (KITS) ×2 IMPLANT
MESH 3DMAX 5X7 LT XLRG (Mesh General) IMPLANT
MESH 3DMAX 5X7 RT XLRG (Mesh General) IMPLANT
NDL INSUFFLATION 14GA 120MM (NEEDLE) IMPLANT
NEEDLE INSUFFLATION 14GA 120MM (NEEDLE) ×1 IMPLANT
NS IRRIG 1000ML POUR BTL (IV SOLUTION) ×2 IMPLANT
PAD ARMBOARD POSITIONER FOAM (MISCELLANEOUS) ×4 IMPLANT
RELOAD STAPLE 4.0 BLU F/HERNIA (INSTRUMENTS) IMPLANT
RELOAD STAPLE HERNIA 4.0 BLUE (INSTRUMENTS) ×2 IMPLANT
SCISSORS LAP 5X35 DISP (ENDOMECHANICALS) ×2 IMPLANT
SET TUBE SMOKE EVAC HIGH FLOW (TUBING) ×2 IMPLANT
STAPLER HERNIA 12 8.5 360D (INSTRUMENTS) IMPLANT
SUT MNCRL AB 4-0 PS2 18 (SUTURE) ×2 IMPLANT
SUT VICRYL 0 UR6 27IN ABS (SUTURE) IMPLANT
TOWEL GREEN STERILE (TOWEL DISPOSABLE) ×2 IMPLANT
TOWEL GREEN STERILE FF (TOWEL DISPOSABLE) ×2 IMPLANT
TRAY LAPAROSCOPIC MC (CUSTOM PROCEDURE TRAY) ×2 IMPLANT
TROCAR OPTICAL SHORT 5MM (TROCAR) ×2 IMPLANT
TROCAR OPTICAL SLV SHORT 5MM (TROCAR) ×2 IMPLANT
TROCAR Z THREAD OPTICAL 12X100 (TROCAR) ×2 IMPLANT
WARMER LAPAROSCOPE (MISCELLANEOUS) ×2 IMPLANT
WATER STERILE IRR 1000ML POUR (IV SOLUTION) ×2 IMPLANT

## 2023-06-15 NOTE — Op Note (Signed)
 06/15/2023  8:24 AM  PATIENT:  Randall [name]  68 y.o. male  PRE-OPERATIVE DIAGNOSIS:  BILATERAL INGUINAL HERNIA  POST-OPERATIVE DIAGNOSIS:  BILATERAL INGUINAL HERNIA, Indirect  PROCEDURE:  Procedure(s) with comments: REPAIR, HERNIA, INGUINAL, LAPAROSCOPIC (Bilateral) - LAPAROSCOPIC BILATERAL INGUINAL HERNIA REPAIR WITH MESH  SURGEON:  Surgeons and Role:    * Shela Derby, MD - Primary  ASSISTANTS: none   ANESTHESIA:   local and general  EBL:  10 mL   BLOOD ADMINISTERED:none  DRAINS: none   LOCAL MEDICATIONS USED:  BUPIVICAINE   SPECIMEN:  No Specimen  DISPOSITION OF SPECIMEN:  N/A  COUNTS:  YES  TOURNIQUET:  * No tourniquets in log *  DICTATION: .Dragon Dictation   Counts: reported as correct x 2  Findings:  The patient had a small righ t& left indirect hernias and cord lipomas  Indications for procedure:  The patient is a 68 year old male with a bilateral hernias for several months. Patient complained of symptomatology to his bilateral inguinal areas. The patient was taken back for elective inguinal hernia repair.  Details of the procedure:   The patient was taken back to the operating room. The patient was placed in supine position with bilateral SCDs in place.  The patient underwent GETA.  The patient was prepped and draped in the usual sterile fashion.  After appropriate anitbiotics were confirmed, a time-out was confirmed and all facts were verified.  0.25% Marcaine  was used to infiltrate the umbilical area. A 11-blade was used to cut down the skin and blunt dissection was used to get the anterior fashion.  The anterior fascia was incised approximately 1 cm and the muscles were retracted laterally. Blunt dissection was then used to create a space in the preperitoneal area. At this time a 10 mm camera was then introduced into the space and advanced the pubic tubercle and a 12 mm trocar was placed over this and insufflation was started.    At this time and  space was created from medial to laterally the preperitoneal space.  Cooper's ligament was initially cleaned off.   The spermatic cord was identified and dissected away from the cremesteric muscle fibers.  This was circumferentially dissected. The hernia was seen in the indirect space. Dissection of the hernia sac was undertaken the vas deferens was identified and protected in all parts of the case.  The peritoneum was dissected back to approximate level of the umbilicus bilaterally.  There was a small tear into the hernia sac. A Veress needle right upper quadrant to help evacuate the intraperitoneal air.    A Bard 3D Max mesh, size: XLarge, was  introduced into the preperitoneal space.  The mesh was brought over to cover the direct and indirect and femoral hernia spaces.  This was anchored into place and secured to Cooper's ligament with 4.56mm staples from a Cov[place] hernia stapler. It was anchored to the anterior abdominal wall with 4.8 mm staples. The hernia sac was seen lying posterior to the mesh. There was no staples placed laterally.   I proceeded with dissection of the left side.  At this time and space was created from medial to laterally the preperitoneal space.  Cooper's ligament was initially cleaned off.  The spermatic cord was identified and dissected away from the cremesteric muscle fibers.  This was circumferentially dissected. The hernia was seen in the indirect space. Dissection of the hernia sac was undertaken the vas deferens was identified and protected in all parts of the case.  The  peritoneum was dissected back to approximate level of the umbilicus bilaterally.A Bard 3D Max mesh, size: XLarge, was  introduced into the preperitoneal space.  The mesh was brought over to cover the direct and indirect and femoral hernia spaces.  This was anchored into place and secured to Cooper's ligament with 4.28mm staples from a [place] hernia stapler. It was anchored to the anterior abdominal wall with  4.8 mm staples. The hernia sac was seen lying posterior to the mesh. There was no staples placed laterally.   The insufflation was evacuated and the peritoneum was seen posterior to the mesh bilaterally. The trochars were removed. The anterior fascia was reapproximated using #1 Vicryl on a UR- 6.  Intra-abdominal air was evacuated and the Veress needle removed. The skin was reapproximated using 4-0 Monocryl subcuticular fashion and was dressed with Dermabond. The  patient was awakened from general anesthesia and taken to recovery in stable condition.    PLAN OF CARE: Discharge to home after PACU  PATIENT DISPOSITION:  PACU - hemodynamically stable.   Delay start of Pharmacological VTE agent (>24hrs) due to surgical blood loss or risk of bleeding: not applicable

## 2023-06-15 NOTE — Progress Notes (Signed)
 Dr. Jarrell Merritts made aware patient's sodium was 128 on labs drawn on 06/12/23. No need to repeat today per Dr. Jarrell Merritts.

## 2023-06-15 NOTE — Transfer of Care (Signed)
 Immediate Anesthesia Transfer of Care Note  Patient: Randall [name]  Procedure(s) Performed: REPAIR, HERNIA, INGUINAL, LAPAROSCOPIC (Bilateral: Inguinal)  Patient Location: PACU  Anesthesia Type:General  Level of Consciousness: drowsy  Airway & Oxygen Therapy: Patient Spontanous Breathing  Post-op Assessment: Report given to RN, Post -op Vital signs reviewed and stable, and Patient moving all extremities X 4  Post vital signs: Reviewed and stable  Last Vitals:  Vitals Value Taken Time  BP 138/57 06/15/23 0834  Temp    Pulse 67 06/15/23 0837  Resp 16 06/15/23 0837  SpO2 100 % 06/15/23 0837  Vitals shown include unfiled device data.  Last Pain:  Vitals:   06/15/23 0834  TempSrc:   PainSc: Asleep      Patients Stated Pain Goal: 3 (06/15/23 2130)  Complications: No notable events documented.

## 2023-06-15 NOTE — Interval H&P Note (Signed)
 History and Physical Interval Note:  06/15/2023 6:53 AM  Randall [name]  has presented today for surgery, with the diagnosis of BILATERAL INGUINAL HERNIA.  The various methods of treatment have been discussed with the patient and family. After consideration of risks, benefits and other options for treatment, the patient has consented to  Procedure(s) with comments: REPAIR, HERNIA, INGUINAL, LAPAROSCOPIC (Bilateral) - LAPAROSCOPIC BILATERAL INGUINAL HERNIA REPAIR WITH MESH as a surgical intervention.  The patient's history has been reviewed, patient examined, no change in status, stable for surgery.  I have reviewed the patient's chart and labs.  Questions were answered to the patient's satisfaction.     [name]

## 2023-06-15 NOTE — Anesthesia Procedure Notes (Signed)
 Procedure Name: Intubation Date/Time: 06/15/2023 7:30 AM  Performed by: Rochelle Chu, CRNAPre-anesthesia Checklist: Patient identified, Emergency Drugs available, Suction available and Patient being monitored Patient Re-evaluated:Patient Re-evaluated prior to induction Oxygen Delivery Method: Circle system utilized Preoxygenation: Pre-oxygenation with 100% oxygen Induction Type: IV induction Ventilation: Mask ventilation without difficulty and Oral airway inserted - appropriate to patient size Laryngoscope Size: Glidescope and 4 Grade View: Grade I Tube type: Oral Tube size: 7.0 mm Number of attempts: 1 Airway Equipment and Method: Stylet and Oral airway Placement Confirmation: ETT inserted through vocal cords under direct vision, positive ETCO2 and breath sounds checked- equal and bilateral Secured at: 22 cm Tube secured with: Tape Dental Injury: Teeth and Oropharynx as per pre-operative assessment  Difficulty Due To: Difficulty was anticipated and Difficult Airway- due to anterior larynx

## 2023-06-15 NOTE — Discharge Instructions (Signed)

## 2023-06-15 NOTE — Anesthesia Postprocedure Evaluation (Signed)
 Anesthesia Post Note  Patient: Randall [name]  Procedure(s) Performed: REPAIR, HERNIA, INGUINAL, LAPAROSCOPIC (Bilateral: Inguinal)     Patient location during evaluation: PACU Anesthesia Type: General Level of consciousness: awake and alert Pain management: pain level controlled Vital Signs Assessment: post-procedure vital signs reviewed and stable Respiratory status: spontaneous breathing, nonlabored ventilation and respiratory function stable Cardiovascular status: blood pressure returned to baseline Postop Assessment: no apparent nausea or vomiting Anesthetic complications: no   No notable events documented.  Last Vitals:  Vitals:   06/15/23 0915 06/15/23 0930  BP: 136/67 (!) 145/67  Pulse: 61 62  Resp: 18 17  Temp:  36.5 C  SpO2: 100% 100%    Last Pain:  Vitals:   06/15/23 0925  TempSrc:   PainSc: 4                  Rayfield Cairo

## 2023-06-16 ENCOUNTER — Encounter (HOSPITAL_COMMUNITY): Payer: Self-pay | Admitting: General Surgery

## 2023-06-26 ENCOUNTER — Encounter: Payer: Self-pay | Admitting: Emergency Medicine

## 2023-06-26 ENCOUNTER — Ambulatory Visit: Attending: Emergency Medicine | Admitting: Emergency Medicine

## 2023-06-26 VITALS — BP 122/52 | HR 61 | Ht 70.0 in | Wt 165.0 lb

## 2023-06-26 DIAGNOSIS — I5032 Chronic diastolic (congestive) heart failure: Secondary | ICD-10-CM

## 2023-06-26 DIAGNOSIS — I2510 Atherosclerotic heart disease of native coronary artery without angina pectoris: Secondary | ICD-10-CM | POA: Diagnosis not present

## 2023-06-26 DIAGNOSIS — M7989 Other specified soft tissue disorders: Secondary | ICD-10-CM | POA: Diagnosis not present

## 2023-06-26 DIAGNOSIS — E118 Type 2 diabetes mellitus with unspecified complications: Secondary | ICD-10-CM

## 2023-06-26 DIAGNOSIS — E785 Hyperlipidemia, unspecified: Secondary | ICD-10-CM

## 2023-06-26 DIAGNOSIS — I1A0 Resistant hypertension: Secondary | ICD-10-CM | POA: Diagnosis not present

## 2023-06-26 DIAGNOSIS — E871 Hypo-osmolality and hyponatremia: Secondary | ICD-10-CM

## 2023-06-26 MED ORDER — FUROSEMIDE 20 MG PO TABS: 20.0000 mg | ORAL_TABLET | Freq: Every day | ORAL | 2 refills | Status: DC

## 2023-06-26 NOTE — Progress Notes (Signed)
 Cardiology Office Note:    Date:  06/26/2023  ID:  Randall [name], DOB 1955-09-21, MRN [id number] PCP: Belenda Bowie  [facility] Providers Cardiologist:  [name], MD       Patient Profile:       Chief Complaint: Hospital follow-up for chest pain History of Present Illness:  Randall [name] is a 68 y.o. male with visit-pertinent history of resistant hypertension, chronic diastolic heart failure, HLD, elevated coronary artery calcium  score, thoracic aortic atherosclerosis, right bundle branch block, T2DM, laryngeal cancer.    He established care with cardiology service in November 2022 for abnormal EKG and hypertension management.  He was on 3 antihypertensive agents with persistently elevated blood pressures and his EKG showed right bundle branch block.  Echocardiogram was ordered and completed 12/08/2020 showing LVEF 45-50%, global hypokinesis, grade 1 DD, mild mitral valve regurgitation, mild calcification of the aortic valve.  Decreased LV function was thought to be in the setting of uncontrolled HTN.  Bundle branch block thought to be related to wall motion abnormality.  CT cardiac scoring completed on 02/02/2021 showing coronary calcium  score 683, 87 percentile for age, race, sex and over read interpretation CT chest showing aortic atherosclerosis.   Seen in clinic on 02/18/2022 and was without any active cardiovascular symptoms.  His blood pressure was still borderline high and his chlorthalidone  was titrated up to 25 mg.  He was on combination of amlodipine  10 mg, carvedilol  25 mg twice daily, telmisartan 80 mg daily, chlorthalidone  25 mg daily.   Seen in clinic on 01/12/2023.  Noted to experience significant increase in his chronic lower extremity edema.  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  His Lasix  was increased to 40 mg daily.  Echocardiogram ordered showing LVEF 55-60%, grade 2 DD, RV SF normal, RV moderately enlarged,  left atrial size severely dilated, no valvular abnormalities  Patient was recently admitted on 06/10/2023 for chest pain.  He presented with 3 days of left arm discomfort with associated midsternal chest pain. ECG with RBBB/LAFB which is intermittent from 09/25/20 with some ECG with normal conduction and others with 1' AVB, RBBB and LAFB. No ischemic changes.  He underwent cardiac catheterization on 06/12/2023 showing moderate single-vessel disease with 60% RPDA stenosis with normal LVEF and normal EDP.  Echocardiogram 06/12/2023 showed LVEF 55 to 60%, inferolateral hypokinesis, grade 2 DD, RV function size normal, mild mitral valve regurgitation.  Chlorthalidone  was discontinued and he was started on amlodipine  due to hyponatremia.   Discussed the use of AI scribe software for clinical note transcription with the patient, who gave verbal consent to proceed.  History of Present Illness Randall [name] is a 68 year old male who presents with chest pain.  He experienced sudden onset chest pain described as feeling like 'an elephant sitting on my chest,' with radiation to his left arm, which became cold and numb.   He reports that his chest pains have entirely resolved.  He feels much improved.  Denies any exertional symptoms.  He denies any dyspnea, orthopnea, PND.  He denies any anginal symptoms.  No syncope, presyncope, lightheadedness, dizziness.  He engages in extensive physical activities, for 16-20 hours a day, seven days a week, for over six months. This intense physical activity, coupled with poor sleep, has contributed to his exhaustion. He is also managing his wife's poor health and medical appointments, adding to his stress.  He has experienced leg swelling, particularly after the  discontinuation of chlorthalidone  and furosemide  in the hospital and addition of amlodipine . He has resumed taking 20 mg of Lasix  and recently restarted chlorthalidone  due to fluid retention after this is  previously discontinued due to hyponatremia.    Review of systems:  Please see the history of present illness. All other systems are reviewed and otherwise negative.      Studies Reviewed:        Echocardiogram 06/12/2023 1. Left ventricular ejection fraction, by estimation, is 55 to 60%. Left  ventricular ejection fraction by 2D MOD biplane is 57.6 %. The left  ventricle has normal function. The left ventricle demonstrates regional  wall motion abnormalities with  inferolateral hypokinesis. Left ventricular diastolic parameters are  consistent with Grade II diastolic dysfunction (pseudonormalization).   2. Right ventricular systolic function is normal. The right ventricular  size is normal. Tricuspid regurgitation signal is inadequate for assessing  PA pressure.   3. Left atrial size was mildly dilated.   4. The mitral valve is normal in structure. Mild mitral valve  regurgitation. No evidence of mitral stenosis.   5. The aortic valve is tricuspid. There is mild calcification of the  aortic valve. Aortic valve regurgitation is not visualized. No aortic  stenosis is present.   6. The inferior vena cava is dilated in size with <50% respiratory  variability, suggesting right atrial pressure of 15 mmHg.    Cardiac catheterization 06/12/2023 POST-OPERATIVE DIAGNOSIS:   Moderate single-vessel disease with 60% RPDA stenosis and a codominant system. Normal LVEF with normal EDP   RECOMMENDATIONS:     In the absence of any other complications or medical issues, we expect the patient to be ready for discharge from a cath perspective on 06/12/2023.   Recommend Aspirin  81mg  daily for moderate CAD. Diagnostic Dominance: Co-dominant  Risk Assessment/Calculations:              Physical Exam:   VS:  BP (!) 122/52 (BP Location: Left Arm, Patient Position: Sitting, Cuff Size: Normal)   Pulse 61   Ht 5' 10 (1.778 m)   Wt 165 lb (74.8 kg)   BMI 23.68 kg/m    Wt Readings from Last 3  Encounters:  06/26/23 165 lb (74.8 kg)  06/15/23 159 lb 11.2 oz (72.4 kg)  06/10/23 167 lb 3.2 oz (75.8 kg)    GEN: Well nourished, well developed in no acute distress NECK: No JVD; No carotid bruits CARDIAC: RRR, no murmurs, rubs, gallops RESPIRATORY:  Clear to auscultation without rales, wheezing or rhonchi  ABDOMEN: Soft, non-tender, non-distended EXTREMITIES:  No edema; No acute deformity      Assessment and Plan:  Coronary artery disease LHC 06/2023 with moderate single-vessel disease with 60% RPDA stenosis and a codominant system, normal LVEF and EDP Echocardiogram 06/2023 with LVEF 55 to 60% - Today patient is without any anginal symptoms.  Notes chest pains have entirely resolved.  Remains active without chest pain.  There is no indication for further ischemic evaluation this time - Continue aspirin  81 mg daily, atorvastatin  80 mg daily, telmisartan 80 mg daily  HFpEF Leg swelling Echocardiogram 06/2023 with LVEF 55 to 60%, inferolateral hypokinesis, grade 2 DD - Today patient is euvolemic and well-compensated on exam today. He has noticed some mild lower extremity edema after discontinuation of chlorthalidone  and Lasix  and addition of amlodipine  due to hyponatremia.  He then subsequently self-started chlorthalidone  and Lasix  back approximately 1 to 2 weeks ago due to swelling. Has a slight improvement.  However given his  hyponatremia I have encouraged him to no longer take his chlorthalidone .  I am okay with him resuming his Lasix  at lower dose 20 mg daily.  If lower extremity edema continues we will likely trial him off of amlodipine  given swelling is common side effect - Continue Lasix  20 mg daily, carvedilol  25 mg twice daily, telmisartan 80 mg daily - BMET today   Resistant hypertension Blood pressure today is 122/52 and well-controlled - Continue amlodipine  10 mg daily, carvedilol  25 mg twice daily, telmisartan 80 mg daily - Maintain home BP log   Hyperlipidemia, LDL goal  <70 LDL 54 on 06/2023 and well-controlled Lipoprotein (A) 101.1 on 06/2023 - Continue atorvastatin  80 mg daily  T2DM A1c 5.2% on 05/2023 - Continue current medical therapy - Managed by PCP  Lymphedema 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  Currently wearing compression garments - Continue therapy for lymphedema management  Hyponatremia Sodium 128 on 06/12/2023 - Repeat BMET today      Dispo:  Return in about 3 months (around 09/26/2023).  Signed, Ava Boatman, NP

## 2023-06-26 NOTE — Patient Instructions (Addendum)
 Medication Instructions:  START TAKING LASIX  20 MG DAILY. CONTINUE WITH ALL OTHER MEDICATION THERAPY.  Lab Work: Nutritional therapist AND CBC TO BE DONE TODAY.   Testing/Procedures: NONE  Follow-Up: At [facility], you and your health needs are our priority.  As part of our continuing mission to provide you with exceptional heart care, our providers are all part of one team.  This team includes your primary Cardiologist (physician) and Advanced Practice Providers or APPs (Physician Assistants and Nurse Practitioners) who all work together to provide you with the care you need, when you need it.  Your next appointment:   3 MONTHS  Provider:   MADISON FOUNTAIN, DNP

## 2023-06-27 LAB — CBC
Hematocrit: 31.5 % — ABNORMAL LOW (ref 37.5–51.0)
Hemoglobin: 10.4 g/dL — ABNORMAL LOW (ref 13.0–17.7)
MCH: 31.4 pg (ref 26.6–33.0)
MCHC: 33 g/dL (ref 31.5–35.7)
MCV: 95 fL (ref 79–97)
Platelets: 209 10*3/uL (ref 150–450)
RBC: 3.31 x10E6/uL — ABNORMAL LOW (ref 4.14–5.80)
RDW: 12.5 % (ref 11.6–15.4)
WBC: 6.6 10*3/uL (ref 3.4–10.8)

## 2023-06-27 LAB — BASIC METABOLIC PANEL WITH GFR
BUN/Creatinine Ratio: 15 (ref 10–24)
BUN: 22 mg/dL (ref 8–27)
CO2: 23 mmol/L (ref 20–29)
Calcium: 9.4 mg/dL (ref 8.6–10.2)
Chloride: 94 mmol/L — ABNORMAL LOW (ref 96–106)
Creatinine, Ser: 1.45 mg/dL — ABNORMAL HIGH (ref 0.76–1.27)
Glucose: 96 mg/dL (ref 70–99)
Potassium: 4.2 mmol/L (ref 3.5–5.2)
Sodium: 133 mmol/L — ABNORMAL LOW (ref 134–144)
eGFR: 53 mL/min/1.73 — ABNORMAL LOW

## 2023-06-28 ENCOUNTER — Ambulatory Visit: Payer: Self-pay | Admitting: Emergency Medicine

## 2023-06-28 DIAGNOSIS — Z79899 Other long term (current) drug therapy: Secondary | ICD-10-CM

## 2023-07-12 ENCOUNTER — Other Ambulatory Visit: Payer: Self-pay | Admitting: *Deleted

## 2023-07-12 ENCOUNTER — Telehealth: Payer: Self-pay | Admitting: *Deleted

## 2023-07-12 ENCOUNTER — Other Ambulatory Visit: Payer: Self-pay

## 2023-07-12 DIAGNOSIS — Z79899 Other long term (current) drug therapy: Secondary | ICD-10-CM

## 2023-07-12 DIAGNOSIS — I110 Hypertensive heart disease with heart failure: Secondary | ICD-10-CM

## 2023-07-12 DIAGNOSIS — E871 Hypo-osmolality and hyponatremia: Secondary | ICD-10-CM

## 2023-07-12 DIAGNOSIS — I1A0 Resistant hypertension: Secondary | ICD-10-CM

## 2023-07-12 DIAGNOSIS — N182 Chronic kidney disease, stage 2 (mild): Secondary | ICD-10-CM

## 2023-07-12 DIAGNOSIS — E119 Type 2 diabetes mellitus without complications: Secondary | ICD-10-CM

## 2023-07-12 DIAGNOSIS — E118 Type 2 diabetes mellitus with unspecified complications: Secondary | ICD-10-CM

## 2023-07-12 DIAGNOSIS — I2510 Atherosclerotic heart disease of native coronary artery without angina pectoris: Secondary | ICD-10-CM

## 2023-07-12 DIAGNOSIS — I5032 Chronic diastolic (congestive) heart failure: Secondary | ICD-10-CM

## 2023-07-12 NOTE — Telephone Encounter (Signed)
 Bmet order placed in the system, as our lab downstairs requested.  Lum Louis NP ordered this.

## 2023-07-13 ENCOUNTER — Ambulatory Visit: Payer: Self-pay | Admitting: Emergency Medicine

## 2023-07-13 LAB — BASIC METABOLIC PANEL WITH GFR
BUN/Creatinine Ratio: 17 (ref 10–24)
BUN: 16 mg/dL (ref 8–27)
CO2: 23 mmol/L (ref 20–29)
Calcium: 8.8 mg/dL (ref 8.6–10.2)
Chloride: 91 mmol/L — ABNORMAL LOW (ref 96–106)
Creatinine, Ser: 0.95 mg/dL (ref 0.76–1.27)
Glucose: 69 mg/dL — ABNORMAL LOW (ref 70–99)
Potassium: 3.3 mmol/L — ABNORMAL LOW (ref 3.5–5.2)
Sodium: 127 mmol/L — ABNORMAL LOW (ref 134–144)
eGFR: 88 mL/min/{1.73_m2} (ref >59)

## 2023-07-13 MED ORDER — POTASSIUM CHLORIDE CRYS ER 20 MEQ PO TBCR
40.0000 meq | EXTENDED_RELEASE_TABLET | Freq: Every day | ORAL | 3 refills | Status: DC
Start: 2023-07-13 — End: 2023-08-28

## 2023-07-13 NOTE — Telephone Encounter (Signed)
 Noted. Lasix  dosage changed to 20 mg daily as needed for weight gain or swelling. Potassium increased to 40 mEq daily. BMET order is in Epic.

## 2023-07-24 ENCOUNTER — Telehealth: Payer: Self-pay | Admitting: *Deleted

## 2023-07-24 DIAGNOSIS — I1A0 Resistant hypertension: Secondary | ICD-10-CM

## 2023-07-24 DIAGNOSIS — Z79899 Other long term (current) drug therapy: Secondary | ICD-10-CM

## 2023-07-24 NOTE — Telephone Encounter (Signed)
 Called patient to make sure that he comes into the office this week to have a BMET drawn. He said that he would be in the office tomorrow morning to have that drawn. I also let him know that [facility] wanted for him to only take the Potassium Chloride  when he takes the Lasix . He understood and had no questions. BMET order has been put in.

## 2023-07-26 LAB — BASIC METABOLIC PANEL WITH GFR
BUN/Creatinine Ratio: 19 (ref 10–24)
BUN: 17 mg/dL (ref 8–27)
CO2: 18 mmol/L — ABNORMAL LOW (ref 20–29)
Calcium: 9.2 mg/dL (ref 8.6–10.2)
Chloride: 99 mmol/L (ref 96–106)
Creatinine, Ser: 0.91 mg/dL (ref 0.76–1.27)
Glucose: 114 mg/dL — ABNORMAL HIGH (ref 70–99)
Potassium: 4.3 mmol/L (ref 3.5–5.2)
Sodium: 135 mmol/L (ref 134–144)
eGFR: 92 mL/min/1.73 (ref >59)

## 2023-07-27 ENCOUNTER — Ambulatory Visit: Payer: Self-pay | Admitting: Emergency Medicine

## 2023-07-28 ENCOUNTER — Emergency Department (HOSPITAL_BASED_OUTPATIENT_CLINIC_OR_DEPARTMENT_OTHER)

## 2023-07-28 ENCOUNTER — Emergency Department (HOSPITAL_BASED_OUTPATIENT_CLINIC_OR_DEPARTMENT_OTHER)
Admission: EM | Admit: 2023-07-28 | Discharge: 2023-07-28 | Disposition: A | Discharge status: Home / Self Care | Attending: Emergency Medicine | Admitting: Emergency Medicine

## 2023-07-28 ENCOUNTER — Encounter (HOSPITAL_BASED_OUTPATIENT_CLINIC_OR_DEPARTMENT_OTHER): Payer: Self-pay

## 2023-07-28 ENCOUNTER — Other Ambulatory Visit: Payer: Self-pay

## 2023-07-28 DIAGNOSIS — R1084 Generalized abdominal pain: Secondary | ICD-10-CM | POA: Diagnosis present

## 2023-07-28 DIAGNOSIS — I509 Heart failure, unspecified: Secondary | ICD-10-CM | POA: Diagnosis not present

## 2023-07-28 DIAGNOSIS — E119 Type 2 diabetes mellitus without complications: Secondary | ICD-10-CM | POA: Diagnosis not present

## 2023-07-28 DIAGNOSIS — Z91040 Latex allergy status: Secondary | ICD-10-CM | POA: Diagnosis not present

## 2023-07-28 DIAGNOSIS — N189 Chronic kidney disease, unspecified: Secondary | ICD-10-CM | POA: Insufficient documentation

## 2023-07-28 DIAGNOSIS — Z7982 Long term (current) use of aspirin: Secondary | ICD-10-CM | POA: Insufficient documentation

## 2023-07-28 LAB — COMPREHENSIVE METABOLIC PANEL WITH GFR
ALT: 16 U/L (ref 0–44)
AST: 25 U/L (ref 15–41)
Albumin: 4.6 g/dL (ref 3.5–5.0)
Alkaline Phosphatase: 48 U/L (ref 38–126)
Anion gap: 12 (ref 5–15)
BUN: 12 mg/dL (ref 8–23)
CO2: 23 mmol/L (ref 22–32)
Calcium: 10 mg/dL (ref 8.9–10.3)
Chloride: 99 mmol/L (ref 98–111)
Creatinine, Ser: 0.9 mg/dL (ref 0.61–1.24)
GFR, Estimated: >60 mL/min (ref >60)
Glucose, Bld: 100 mg/dL — ABNORMAL HIGH (ref 70–99)
Potassium: 3.9 mmol/L (ref 3.5–5.1)
Sodium: 134 mmol/L — ABNORMAL LOW (ref 135–145)
Total Bilirubin: 0.5 mg/dL (ref 0.0–1.2)
Total Protein: 7 g/dL (ref 6.5–8.1)

## 2023-07-28 LAB — TROPONIN T, HIGH SENSITIVITY
Troponin T High Sensitivity: 25 ng/L — ABNORMAL HIGH (ref <19)
Troponin T High Sensitivity: 22 ng/L — ABNORMAL HIGH (ref <19)

## 2023-07-28 LAB — CBC
HCT: 35.6 % — ABNORMAL LOW (ref 39.0–52.0)
Hemoglobin: 12 g/dL — ABNORMAL LOW (ref 13.0–17.0)
MCH: 31.8 pg (ref 26.0–34.0)
MCHC: 33.7 g/dL (ref 30.0–36.0)
MCV: 94.4 fL (ref 80.0–100.0)
Platelets: 194 K/uL (ref 150–400)
RBC: 3.77 MIL/uL — ABNORMAL LOW (ref 4.22–5.81)
RDW: 13.5 % (ref 11.5–15.5)
WBC: 6.1 K/uL (ref 4.0–10.5)
nRBC: 0 % (ref 0.0–0.2)

## 2023-07-28 LAB — URINALYSIS, ROUTINE W REFLEX MICROSCOPIC
Bilirubin Urine: NEGATIVE
Glucose, UA: NEGATIVE mg/dL
Hgb urine dipstick: NEGATIVE
Ketones, ur: NEGATIVE mg/dL
Leukocytes,Ua: NEGATIVE
Nitrite: NEGATIVE
Protein, ur: NEGATIVE mg/dL
Specific Gravity, Urine: 1.013 (ref 1.005–1.030)
pH: 6.5 (ref 5.0–8.0)

## 2023-07-28 LAB — LIPASE, BLOOD: Lipase: 18 U/L (ref 11–51)

## 2023-07-28 MED ORDER — METOCLOPRAMIDE HCL 5 MG/ML IJ SOLN
5.0000 mg | Freq: Once | INTRAMUSCULAR | Status: AC
  Administered 2023-07-28: 5 mg via INTRAVENOUS
  Filled 2023-07-28: qty 2

## 2023-07-28 MED ORDER — MORPHINE SULFATE (PF) 4 MG/ML IV SOLN
4.0000 mg | Freq: Once | INTRAVENOUS | Status: AC
  Administered 2023-07-28: 4 mg via INTRAVENOUS
  Filled 2023-07-28: qty 1

## 2023-07-28 MED ORDER — IOHEXOL 300 MG/ML  SOLN
100.0000 mL | Freq: Once | INTRAMUSCULAR | Status: AC | PRN
  Administered 2023-07-28: 100 mL via INTRAVENOUS

## 2023-07-28 MED ORDER — ALUM & MAG HYDROXIDE-SIMETH 200-200-20 MG/5ML PO SUSP
30.0000 mL | Freq: Once | ORAL | Status: AC
  Administered 2023-07-28: 30 mL via ORAL
  Filled 2023-07-28: qty 30

## 2023-07-28 MED ORDER — DICYCLOMINE HCL 10 MG PO CAPS
10.0000 mg | ORAL_CAPSULE | Freq: Once | ORAL | Status: AC
  Administered 2023-07-28: 10 mg via ORAL
  Filled 2023-07-28: qty 1

## 2023-07-28 MED ORDER — KETOROLAC TROMETHAMINE 15 MG/ML IJ SOLN
15.0000 mg | Freq: Once | INTRAMUSCULAR | Status: AC
  Administered 2023-07-28: 15 mg via INTRAVENOUS
  Filled 2023-07-28: qty 1

## 2023-07-28 MED ORDER — DICYCLOMINE HCL 20 MG PO TABS: 20.0000 mg | ORAL_TABLET | Freq: Two times a day (BID) | ORAL | 0 refills | Status: DC

## 2023-07-28 NOTE — ED Notes (Signed)
 Patient transported to CT

## 2023-07-28 NOTE — ED Notes (Signed)
Reviewed discharge instructions, medications, and home care with pt. Pt verbalized understanding and had no further questions. Pt exited ED without complications.

## 2023-07-28 NOTE — ED Provider Notes (Signed)
 [facility] EMERGENCY DEPARTMENT AT [facility] Provider Note   CSN: [id number] Arrival date & time: 07/28/23  1720     Patient presents with: No chief complaint on file.   Randall [name] is a 68 y.o. male.  Patient with past medical history of diabetes, GERD, CHF, chronic kidney disease presents to emergency room with complaint of generalized abdominal pain.  At this is associated with some mild nausea.  He notes that he mostly came in because he had recent hernia surgery and everything has been going well but he wanted recheck.  Also notes he had a recent heart cath that he was told he was reassuring with no significant stenosis.  He denies chest pain shortness of breath or cough today.  Denies any lower extremity swelling.   HPI     Prior to Admission medications   Medication Sig Start Date End Date Taking? Authorizing Provider  amLODipine  (NORVASC ) 10 MG tablet Take 1 tablet (10 mg total) by mouth daily. 06/13/23   Wouk, Devaughn Sayres, MD  aspirin  EC 81 MG tablet Take 81 mg by mouth daily. Swallow whole.    [provider]  atorvastatin  (LIPITOR) 80 MG tablet Take 1 tablet (80 mg total) by mouth daily. 06/13/23   Wouk, Devaughn Sayres, MD  carvedilol  (COREG ) 25 MG tablet Take 25 mg by mouth 2 (two) times daily with a meal.    [provider]  cetirizine (ZYRTEC) 10 MG tablet Take 10 mg by mouth every evening.    [provider]  furosemide  (LASIX ) 20 MG tablet Take 1 tablet (20 mg total) by mouth daily. Patient taking differently: Take 20 mg by mouth daily as needed for fluid or edema. 06/26/23   Rana Lum CROME, NP  HYDROcodone -acetaminophen  (HYCET) 7.5-325 mg/15 ml solution Take 10 mL up to TID as needed for pain - not to exceed 30mL daily. 02/27/23   Izell Domino, MD  loperamide  (IMODIUM  A-D) 2 MG tablet Take 2 mg by mouth 3 (three) times daily as needed for diarrhea or loose stools.    [provider]  meloxicam  (MOBIC ) 15 MG tablet Take 15  mg by mouth daily. 09/07/20   [provider]  Multiple Vitamin (MULTIVITAMIN PO) Take 1 tablet by mouth daily.    [provider]  ondansetron  (ZOFRAN ) 8 MG tablet Take 8 mg by mouth every 8 (eight) hours as needed for nausea or vomiting.    [provider]  OVER THE COUNTER MEDICATION Take 30 mLs by mouth every 6 (six) hours as needed (Pain). PainQuil    [provider]  oxymetazoline  (AFRIN) 0.05 % nasal spray Place 1 spray into both nostrils 2 (two) times daily as needed for congestion.    [provider]  pantoprazole  (PROTONIX ) 40 MG tablet Take 40 mg by mouth daily.    [provider]  polyethylene glycol (MIRALAX / GLYCOLAX) 17 g packet Take 17 g by mouth daily as needed for moderate constipation.    [provider]  potassium chloride  SA (KLOR-CON  M) 20 MEQ tablet Take 2 tablets (40 mEq total) by mouth daily. 07/13/23   Rana Lum CROME, NP  PREVIDENT 5000 ENAMEL PROTECT 1.1-5 % GEL Place 1 application  onto teeth at bedtime.    [provider]  tadalafil (CIALIS) 20 MG tablet Take 20 mg by mouth daily as needed for erectile dysfunction. 07/27/20   [provider]  telmisartan (MICARDIS) 80 MG tablet Take 80 mg by mouth daily.  [provider]  traMADol  (ULTRAM ) 50 MG tablet Take 50 mg by mouth every 6 (six) hours as needed for moderate pain (pain score 4-6). 05/25/23   [provider]  traMADol  (ULTRAM ) 50 MG tablet Take 1 tablet (50 mg total) by mouth every 6 (six) hours as needed. 06/15/23 06/14/24  Rubin Calamity, MD  TRULICITY 0.75 MG/0.5ML SOAJ Inject 0.75 mg into the skin once a week.    [provider]    Allergies: Norgesic forte [orphenadrine-aspirin -caffeine] and Latex    Review of Systems  Gastrointestinal:  Positive for abdominal pain.    Updated Vital Signs BP (!) 147/77   Pulse 64   Temp 98.1 F (36.7 C)   Resp (!) 22   SpO2 96%   Physical Exam Vitals and  nursing note reviewed.  Constitutional:      General: He is not in acute distress.    Appearance: He is not toxic-appearing.  HENT:     Head: Normocephalic and atraumatic.  Eyes:     General: No scleral icterus.    Conjunctiva/sclera: Conjunctivae normal.  Cardiovascular:     Rate and Rhythm: Normal rate and regular rhythm.     Pulses: Normal pulses.     Heart sounds: Normal heart sounds.  Pulmonary:     Effort: Pulmonary effort is normal. No respiratory distress.     Breath sounds: Normal breath sounds.  Abdominal:     General: Abdomen is flat. Bowel sounds are normal.     Palpations: Abdomen is soft.     Tenderness: There is abdominal tenderness.     Comments: Generalized mild tenderness to palpation.  Musculoskeletal:     Right lower leg: No edema.     Left lower leg: No edema.  Skin:    General: Skin is warm and dry.     Findings: No lesion.  Neurological:     General: No focal deficit present.     Mental Status: He is alert and oriented to person, place, and time. Mental status is at baseline.     (all labs ordered are listed, but only abnormal results are displayed) Labs Reviewed  COMPREHENSIVE METABOLIC PANEL WITH GFR - Abnormal; Notable for the following components:      Result Value   Sodium 134 (*)    Glucose, Bld 100 (*)    All other components within normal limits  CBC - Abnormal; Notable for the following components:   RBC 3.77 (*)    Hemoglobin 12.0 (*)    HCT 35.6 (*)    All other components within normal limits  TROPONIN T, HIGH SENSITIVITY - Abnormal; Notable for the following components:   Troponin T High Sensitivity 25 (*)    All other components within normal limits  LIPASE, BLOOD  URINALYSIS, ROUTINE W REFLEX MICROSCOPIC  TROPONIN T, HIGH SENSITIVITY    EKG: EKG Interpretation Date/Time:  Friday July 28 2023 18:42:46 EDT Ventricular Rate:  65 PR Interval:  240 QRS Duration:  175 QT Interval:  460 QTC Calculation: 479 R  Axis:   -66  Text Interpretation: Sinus rhythm Prolonged PR interval Confirmed by Ruthe Cornet [phone]) on 07/28/2023 7:07:59 PM  Radiology: CT ABDOMEN PELVIS W CONTRAST Result Date: 07/28/2023 CLINICAL DATA:  Abdominal pain, acute, nonlocalized EXAM: CT ABDOMEN AND PELVIS WITH CONTRAST TECHNIQUE: Multidetector CT imaging of the abdomen and pelvis was performed using the standard protocol following bolus administration of intravenous contrast. RADIATION DOSE REDUCTION: This exam was performed according to the departmental dose-optimization  program which includes automated exposure control, adjustment of the mA and/or kV according to patient size and/or use of iterative reconstruction technique. CONTRAST:  OMNIPAQUE  IOHEXOL  300 MG/ML  SOLN COMPARISON:  CT abdomen pelvis 05/16/2023, PET CT 07/07/2022 FINDINGS: Lower chest: No acute abnormality. Hepatobiliary: No focal liver abnormality. No gallstones, gallbladder wall thickening, or pericholecystic fluid. No biliary dilatation. Pancreas: No focal lesion. Normal pancreatic contour. No surrounding inflammatory changes. No main pancreatic ductal dilatation. Spleen: Normal in size without focal abnormality. Adrenals/Urinary Tract: No adrenal nodule bilaterally. Bilateral kidneys enhance symmetrically. No hydronephrosis. No hydroureter. Question mild irregular urinary bladder wall thickening. On delayed imaging, there is no urothelial wall thickening and there are no filling defects in the opacified portions of the bilateral collecting systems or ureters. Stomach/Bowel: Stomach is within normal limits. No evidence of bowel wall thickening or dilatation. The appendix is not definitely identified with no inflammatory changes in the right lower quadrant to suggest acute appendicitis. Possible appendectomy. Vascular/Lymphatic: No abdominal aorta or iliac aneurysm. Moderate atherosclerotic plaque of the aorta and its branches. No abdominal, pelvic, or inguinal  lymphadenopathy. Reproductive: Prostate is unremarkable. Other: No intraperitoneal free fluid. No intraperitoneal free gas. No organized fluid collection. Musculoskeletal: Skin staples noted along the anterior peritoneum the lower pelvis. No abdominal wall hernia or abnormality. No suspicious lytic or blastic osseous lesions. No acute displaced fracture. Chronic L5 superior endplate concavity. IMPRESSION: 1. Question mild irregular urinary bladder wall thickening. Recommend correlation with urinalysis. 2.  Aortic Atherosclerosis (ICD10-I70.0). Electronically Signed   By: Morgane  Naveau M.D.   On: 07/28/2023 20:02   DG Chest Portable 1 View Result Date: 07/28/2023 CLINICAL DATA:  upper abd pain EXAM: PORTABLE CHEST - 1 VIEW COMPARISON:  Jun 10, 2023 FINDINGS: Elevation of the left hemidiaphragm, unchanged, and mild. No focal airspace consolidation, pleural effusion, or pneumothorax. No cardiomegaly. Tortuous aorta with aortic atherosclerosis. No acute fracture or destructive lesion. IMPRESSION: No acute cardiopulmonary abnormality. Electronically Signed   By: Rogelia Myers M.D.   On: 07/28/2023 19:07     Procedures   Medications Ordered in the ED  ketorolac  (TORADOL ) 15 MG/ML injection 15 mg (has no administration in time range)  metoCLOPramide (REGLAN) injection 5 mg (5 mg Intravenous Given 07/28/23 1855)  morphine  (PF) 4 MG/ML injection 4 mg (4 mg Intravenous Given 07/28/23 1854)  iohexol  (OMNIPAQUE ) 300 MG/ML solution 100 mL (100 mLs Intravenous Contrast Given 07/28/23 1936)                                    Medical Decision Making Amount and/or Complexity of Data Reviewed Labs: ordered. Radiology: ordered.  Risk OTC drugs. Prescription drug management.   This patient presents to the ED for concern of abdominal pain, this involves an extensive number of treatment options, and is a complaint that carries with it a high risk of complications and morbidity.  The differential diagnosis  includes bowel obstruction, urinary tract infection, diverticulitis, cholecystitis, pancreatitis   Co morbidities that complicate the patient evaluation  Recent inguinal hernia repair, CHF, coronary artery disease, diabetes    Lab Tests:  I personally interpreted labs.  The pertinent results include: CBC without leukocytosis.  No significant anemia.  CMP with normal electrolytes, no abnormal liver kidney function.  Troponins elevated at 25, will obtain delta Trope UA is negative, lipase within normal limits   Imaging Studies ordered:  I ordered imaging studies including chest x-ray,  CT abdomen pelvis I independently visualized and interpreted imaging which showed no acute pathology I agree with the radiologist interpretation   Cardiac Monitoring: / EKG:  The patient was maintained on a cardiac monitor.  I personally viewed and interpreted the cardiac monitored which showed an underlying rhythm of: Normal sinus   Problem List / ED Course / Critical interventions / Medication management  Patient reports to emergency room with complaint of generalized abdominal pain for 1 day.  He is tolerating oral intake.  He has had somewhat normal bowel movements and he is passing gas.  He has not had any fever.  No dysuria or urinary frequency. Although symptoms would be quite atypical of ACS I did check troponin and EKG given patient's history and nonlocalized abdominal pain.  Troponins were flat.  Also had recent cardiac cath.  CT scan shows no acute pathology.  Does show some possible mild bladder wall changes however he does not have urinary tract infection on UA.  Patient has been hemodynamically stable and well-appearing throughout stay.  With otherwise reassuring labs, chest x-ray and abdominal scan feel stable for discharge with outpatient follow-up. I ordered medication including Reglan, Zofran  Reevaluation of the patient after these medicines showed that the patient improved I have  reviewed the patients home medicines and have made adjustments as needed   Plan = F/u w/ PCP in 2-3d to ensure resolution of sx.  Patient was given return precautions. Patient stable for discharge at this time.  Patient educated on sx/dx and verbalized understanding of plan. Return to ER w/ new or worsening sx.       Final diagnoses:  Generalized abdominal pain    ED Discharge Orders     None          Shermon Warren SAILOR, PA-C 07/28/23 2229    Ruthe Cornet, DO 07/28/23 2244

## 2023-07-28 NOTE — ED Triage Notes (Signed)
 Pt c/o some type of internal gastro issue. Yesterday hurting real bad, today it's back again. Generalized abd pain, nausea,  advises 5wks ago heart cath & hernia surgery, concern for hernias but advises he's been compliant w post-op instructions.    Last pain meds approx 5p- hycet

## 2023-07-28 NOTE — Discharge Instructions (Addendum)
 Please follow-up with your primary care doctor as well as your GI doctor on Wednesday as discussed.  In the meantime you can trial Bentyl (take as needed) if it has side effects or drowsiness you can discontinue.  Overall workup is reassuring today.  Return to ER with worsening symptoms.

## 2023-08-14 ENCOUNTER — Emergency Department (HOSPITAL_BASED_OUTPATIENT_CLINIC_OR_DEPARTMENT_OTHER)
Admission: EM | Admit: 2023-08-14 | Discharge: 2023-08-14 | Disposition: A | Discharge status: Home / Self Care | Attending: Emergency Medicine | Admitting: Emergency Medicine

## 2023-08-14 ENCOUNTER — Emergency Department (HOSPITAL_BASED_OUTPATIENT_CLINIC_OR_DEPARTMENT_OTHER)

## 2023-08-14 ENCOUNTER — Other Ambulatory Visit: Payer: Self-pay

## 2023-08-14 DIAGNOSIS — R1084 Generalized abdominal pain: Secondary | ICD-10-CM | POA: Insufficient documentation

## 2023-08-14 DIAGNOSIS — Z79899 Other long term (current) drug therapy: Secondary | ICD-10-CM | POA: Diagnosis not present

## 2023-08-14 DIAGNOSIS — Z91040 Latex allergy status: Secondary | ICD-10-CM | POA: Diagnosis not present

## 2023-08-14 DIAGNOSIS — Z7982 Long term (current) use of aspirin: Secondary | ICD-10-CM | POA: Diagnosis not present

## 2023-08-14 LAB — COMPREHENSIVE METABOLIC PANEL WITH GFR
ALT: 15 U/L (ref 0–44)
AST: 21 U/L (ref 15–41)
Albumin: 4.3 g/dL (ref 3.5–5.0)
Alkaline Phosphatase: 48 U/L (ref 38–126)
Anion gap: 12 (ref 5–15)
BUN: 19 mg/dL (ref 8–23)
CO2: 23 mmol/L (ref 22–32)
Calcium: 9.5 mg/dL (ref 8.9–10.3)
Chloride: 97 mmol/L — ABNORMAL LOW (ref 98–111)
Creatinine, Ser: 1.08 mg/dL (ref 0.61–1.24)
GFR, Estimated: >60 mL/min (ref >60)
Glucose, Bld: 93 mg/dL (ref 70–99)
Potassium: 3.9 mmol/L (ref 3.5–5.1)
Sodium: 132 mmol/L — ABNORMAL LOW (ref 135–145)
Total Bilirubin: 0.4 mg/dL (ref 0.0–1.2)
Total Protein: 6.4 g/dL — ABNORMAL LOW (ref 6.5–8.1)

## 2023-08-14 LAB — CBC
HCT: 31.7 % — ABNORMAL LOW (ref 39.0–52.0)
Hemoglobin: 10.7 g/dL — ABNORMAL LOW (ref 13.0–17.0)
MCH: 31.6 pg (ref 26.0–34.0)
MCHC: 33.8 g/dL (ref 30.0–36.0)
MCV: 93.5 fL (ref 80.0–100.0)
Platelets: 195 K/uL (ref 150–400)
RBC: 3.39 MIL/uL — ABNORMAL LOW (ref 4.22–5.81)
RDW: 13.5 % (ref 11.5–15.5)
WBC: 7.3 K/uL (ref 4.0–10.5)
nRBC: 0 % (ref 0.0–0.2)

## 2023-08-14 LAB — URINALYSIS, ROUTINE W REFLEX MICROSCOPIC
Bilirubin Urine: NEGATIVE
Glucose, UA: NEGATIVE mg/dL
Hgb urine dipstick: NEGATIVE
Ketones, ur: NEGATIVE mg/dL
Leukocytes,Ua: NEGATIVE
Nitrite: NEGATIVE
Protein, ur: NEGATIVE mg/dL
Specific Gravity, Urine: 1.005 (ref 1.005–1.030)
pH: 7.5 (ref 5.0–8.0)

## 2023-08-14 LAB — LIPASE, BLOOD: Lipase: 17 U/L (ref 11–51)

## 2023-08-14 MED ORDER — MORPHINE SULFATE (PF) 4 MG/ML IV SOLN
4.0000 mg | Freq: Once | INTRAVENOUS | Status: AC
  Administered 2023-08-14: 4 mg via INTRAVENOUS
  Filled 2023-08-14: qty 1

## 2023-08-14 MED ORDER — ONDANSETRON HCL 4 MG/2ML IJ SOLN
4.0000 mg | Freq: Once | INTRAMUSCULAR | Status: AC
  Administered 2023-08-14: 4 mg via INTRAVENOUS
  Filled 2023-08-14: qty 2

## 2023-08-14 MED ORDER — IOHEXOL 300 MG/ML  SOLN
100.0000 mL | Freq: Once | INTRAMUSCULAR | Status: AC | PRN
  Administered 2023-08-14: 100 mL via INTRAVENOUS

## 2023-08-14 NOTE — Discharge Instructions (Addendum)
 Follow up with your general surgeon in the office. Return for worsening pain, fever.   There was a lung nodule seen on your CT scan.  Discussed this with your family doctor.  They may want to reimage it in 6 months to a year depending on your risk.

## 2023-08-14 NOTE — ED Notes (Signed)
 Reviewed discharge instructions and follow up care with pt. Pt verbalized understanding and had no further questions. Pt exited ED without complications.

## 2023-08-14 NOTE — ED Notes (Signed)
 Patient transported to CT

## 2023-08-14 NOTE — ED Provider Notes (Signed)
 [place] EMERGENCY DEPARTMENT AT [facility] Provider Note   CSN: [id number] Arrival date & time: 08/14/23  1059     Patient presents with: Abdominal Pain   Randall [name] is a 68 y.o. male.   57 yo M with a chief complaints of abdominal pain.  Tells me that he feels like his hernia repair has failed.  He has pain over the area.  Thinks he can feel a bulge.  Was here for similar about a week ago.  Had CT imaging that showed no obvious etiology of his symptoms.  He has not yet followed up with his surgeon.  He tells me he thought it would be easier to be seen here.  He does have an appoint with his family doctor this afternoon but decided to come here to be evaluated.  No nausea no vomiting.   Abdominal Pain      Prior to Admission medications   Medication Sig Start Date End Date Taking? Authorizing Provider  amLODipine  (NORVASC ) 10 MG tablet Take 1 tablet (10 mg total) by mouth daily. 06/13/23   Wouk, Devaughn Sayres, MD  aspirin  EC 81 MG tablet Take 81 mg by mouth daily. Swallow whole.    [provider]  atorvastatin  (LIPITOR) 80 MG tablet Take 1 tablet (80 mg total) by mouth daily. 06/13/23   Wouk, Devaughn Sayres, MD  carvedilol  (COREG ) 25 MG tablet Take 25 mg by mouth 2 (two) times daily with a meal.    [provider]  cetirizine (ZYRTEC) 10 MG tablet Take 10 mg by mouth every evening.    [provider]  dicyclomine  (BENTYL ) 20 MG tablet Take 1 tablet (20 mg total) by mouth 2 (two) times daily. 07/28/23   Barrett, Warren SAILOR, PA-C  furosemide  (LASIX ) 20 MG tablet Take 1 tablet (20 mg total) by mouth daily. Patient taking differently: Take 20 mg by mouth daily as needed for fluid or edema. 06/26/23   Rana Lum CROME, NP  HYDROcodone -acetaminophen  (HYCET) 7.5-325 mg/15 ml solution Take 10 mL up to TID as needed for pain - not to exceed 30mL daily. 02/27/23   Izell Domino, MD  loperamide  (IMODIUM  A-D) 2 MG tablet Take 2 mg by mouth 3 (three) times daily  as needed for diarrhea or loose stools.    [provider]  meloxicam  (MOBIC ) 15 MG tablet Take 15 mg by mouth daily. 09/07/20   [provider]  Multiple Vitamin (MULTIVITAMIN PO) Take 1 tablet by mouth daily.    [provider]  ondansetron  (ZOFRAN ) 8 MG tablet Take 8 mg by mouth every 8 (eight) hours as needed for nausea or vomiting.    [provider]  OVER THE COUNTER MEDICATION Take 30 mLs by mouth every 6 (six) hours as needed (Pain). PainQuil    [provider]  oxymetazoline  (AFRIN) 0.05 % nasal spray Place 1 spray into both nostrils 2 (two) times daily as needed for congestion.    [provider]  pantoprazole  (PROTONIX ) 40 MG tablet Take 40 mg by mouth daily.    [provider]  polyethylene glycol (MIRALAX / GLYCOLAX) 17 g packet Take 17 g by mouth daily as needed for moderate constipation.    [provider]  potassium chloride  SA (KLOR-CON  M) 20 MEQ tablet Take 2 tablets (40 mEq total) by mouth daily. 07/13/23   Rana Lum CROME, NP  PREVIDENT 5000 ENAMEL PROTECT 1.1-5 % GEL Place 1 application  onto teeth at bedtime.  [provider]  tadalafil (CIALIS) 20 MG tablet Take 20 mg by mouth daily as needed for erectile dysfunction. 07/27/20   [provider]  telmisartan (MICARDIS) 80 MG tablet Take 80 mg by mouth daily.    [provider]  traMADol  (ULTRAM ) 50 MG tablet Take 50 mg by mouth every 6 (six) hours as needed for moderate pain (pain score 4-6). 05/25/23   [provider]  traMADol  (ULTRAM ) 50 MG tablet Take 1 tablet (50 mg total) by mouth every 6 (six) hours as needed. 06/15/23 06/14/24  Rubin Calamity, MD  TRULICITY 0.75 MG/0.5ML SOAJ Inject 0.75 mg into the skin once a week.    [provider]    Allergies: Norgesic forte [orphenadrine-aspirin -caffeine] and Latex    Review of Systems  Gastrointestinal:  Positive for abdominal pain.    Updated Vital Signs BP  (!) 170/80   Pulse 64   Resp 18   SpO2 98%   Physical Exam Vitals and nursing note reviewed.  Constitutional:      Appearance: He is well-developed.  HENT:     Head: Normocephalic and atraumatic.  Eyes:     Pupils: Pupils are equal, round, and reactive to light.  Neck:     Vascular: No JVD.  Cardiovascular:     Rate and Rhythm: Normal rate and regular rhythm.     Heart sounds: No murmur heard.    No friction rub. No gallop.  Pulmonary:     Effort: No respiratory distress.     Breath sounds: No wheezing.  Abdominal:     General: There is no distension.     Tenderness: There is no abdominal tenderness. There is no guarding or rebound.     Comments: I do not appreciate an obvious hernia clinically.  The patient has some surgical scars that now have well-healed.  No obvious focal area of discomfort on exam.  Musculoskeletal:        General: Normal range of motion.     Cervical back: Normal range of motion and neck supple.  Skin:    Coloration: Skin is not pale.     Findings: No rash.  Neurological:     Mental Status: He is alert and oriented to person, place, and time.  Psychiatric:        Behavior: Behavior normal.     (all labs ordered are listed, but only abnormal results are displayed) Labs Reviewed  COMPREHENSIVE METABOLIC PANEL WITH GFR - Abnormal; Notable for the following components:      Result Value   Sodium 132 (*)    Chloride 97 (*)    Total Protein 6.4 (*)    All other components within normal limits  CBC - Abnormal; Notable for the following components:   RBC 3.39 (*)    Hemoglobin 10.7 (*)    HCT 31.7 (*)    All other components within normal limits  URINALYSIS, ROUTINE W REFLEX MICROSCOPIC - Abnormal; Notable for the following components:   Color, Urine COLORLESS (*)    All other components within normal limits  LIPASE, BLOOD    EKG: None  Radiology: CT ABDOMEN PELVIS W CONTRAST Result Date: 08/14/2023 CLINICAL DATA:  Hernia suspected,  abdominal wall. Recent hernia surgery. Right-sided abdominal pain. EXAM: CT ABDOMEN AND PELVIS WITH CONTRAST TECHNIQUE: Multidetector CT imaging of the abdomen and pelvis was performed using the standard protocol following bolus administration of intravenous contrast. RADIATION DOSE REDUCTION: This exam was performed according to the departmental dose-optimization program  which includes automated exposure control, adjustment of the mA and/or kV according to patient size and/or use of iterative reconstruction technique. CONTRAST:  OMNIPAQUE  IOHEXOL  300 MG/ML  SOLN COMPARISON:  CT scan abdomen and pelvis from 07/28/2023. FINDINGS: Lower chest: On the very first image, there is an irregular 6 x 7 mm partially imaged nodule in the right lung lower lobe (series 3, image 1), which was not seen on the prior CT scan chest from 02/09/2023. Please see follow-up recommendations below. Bilateral visualized lungs are otherwise unremarkable. No pleural effusion. Normal heart size. No pericardial effusion. Hepatobiliary: The liver is normal in size. Non-cirrhotic configuration. No suspicious mass. No intrahepatic or extrahepatic bile duct dilation. No calcified gallstones. Normal gallbladder wall thickness. No pericholecystic inflammatory changes. Pancreas: Unremarkable. No pancreatic ductal dilatation or surrounding inflammatory changes. Spleen: Within normal limits. No focal lesion. Adrenals/Urinary Tract: Adrenal glands are unremarkable. No suspicious renal mass. No hydronephrosis. No renal or ureteric calculi. Unremarkable urinary bladder. Stomach/Bowel: No disproportionate dilation of the small or large bowel loops. No evidence of abnormal bowel wall thickening or inflammatory changes. The appendix is unremarkable. There is moderate stool burden. Vascular/Lymphatic: No ascites or pneumoperitoneum. No abdominal or pelvic lymphadenopathy, by size criteria. No aneurysmal dilation of the major abdominal arteries. There are  mild peripheral atherosclerotic vascular calcifications of the aorta and its major branches. Reproductive: Normal size prostate. Symmetric seminal vesicles. Other: Lower anterior abdominal wall surgical scar and metallic staples noted from recent hernia repair. No recurrent hernia noted. No associated abscess or collection in the anterior abdominal wall. There is mild-to-moderate anasarca. The soft tissues and abdominal wall are otherwise unremarkable. Musculoskeletal: No suspicious osseous lesions. There are mild multilevel degenerative changes in the visualized spine. IMPRESSION: 1. No acute inflammatory process identified within the abdomen or pelvis. No recurrent hernia. No abscess or collection in the anterior abdominal wall. 2. There is an irregular 6 x 7 mm nodule in the right lung lower lobe, which was not seen on the prior CT scan chest from 02/09/2023. Please see follow-up recommendations below. 3. Multiple other nonacute observations, as described above. Aortic Atherosclerosis (ICD10-I70.0). Pulmonary nodule follow-up recommendation: Solid lung nodule measuring 6-8 mm: - LOW RISK:CT at 6-12 months, then consider CT at 18-24 months. - HIGHER RISK: CT at 6-12 months, then CT at 18-24 months. Recommendations for evaluation of incidental nodules derived from guidelines developed by the Fleischner Society ( Radiology 2017; [phone]). These guidelines apply to incidental nodules, which can be managed according to the specific recommendations. These guidelines do not apply to patient's younger than 35 years, immunocompromised patients, or patients with cancer. For lung cancer screening, adherence to the existing [employer] of radiology lung CT Screening Reporting and Data System (lung-RADS) guidelines is recommended. High risk factors include older age, heavy smoking, larger nodule size, irregular or spiculated margins and upper lobe location. Clinical risk factors include smoking, exposure to other  carcinogens, emphysema, fibrosis, upper lobe location, family history of lung cancer, age and sex. Electronically Signed   By: Ree Molt M.D.   On: 08/14/2023 14:00     Procedures   Medications Ordered in the ED  morphine  (PF) 4 MG/ML injection 4 mg (4 mg Intravenous Given 08/14/23 1337)  ondansetron  (ZOFRAN ) injection 4 mg (4 mg Intravenous Given 08/14/23 1336)  iohexol  (OMNIPAQUE ) 300 MG/ML solution 100 mL (100 mLs Intravenous Contrast Given 08/14/23 1344)  Medical Decision Making Amount and/or Complexity of Data Reviewed Labs: ordered. Radiology: ordered.  Risk Prescription drug management.   68 yo M with a chief complaints of concern for a hernia.  Patient says he has pain that feels like when he had had a hernia previously.  Just had a hernia repair and thinks that maybe the mesh got ripped loose.  He has not seen his general surgeon for this is now been his second visit to the emergency department setting.  He thought he could be seen quicker in the ER.  I discussed risk and benefits of CT imaging he is currently electing to have it redone today.    CT scan without obvious acute intra-abdominal pathology.  There is an incidental lung nodule.  Will have him follow-up with his general surgeon in the office.  2:07 PM:  I have discussed the diagnosis/risks/treatment options with the patient.  Evaluation and diagnostic testing in the emergency department does not suggest an emergent condition requiring admission or immediate intervention beyond what has been performed at this time.  They will follow up with PCP, Gen surgery. We also discussed returning to the ED immediately if new or worsening sx occur. We discussed the sx which are most concerning (e.g., sudden worsening pain, fever, inability to tolerate by mouth) that necessitate immediate return. Medications administered to the patient during their visit and any new prescriptions provided to the  patient are listed below.  Medications given during this visit Medications  morphine  (PF) 4 MG/ML injection 4 mg (4 mg Intravenous Given 08/14/23 1337)  ondansetron  (ZOFRAN ) injection 4 mg (4 mg Intravenous Given 08/14/23 1336)  iohexol  (OMNIPAQUE ) 300 MG/ML solution 100 mL (100 mLs Intravenous Contrast Given 08/14/23 1344)     The patient appears reasonably screen and/or stabilized for discharge and I doubt any other medical condition or other [facility] requiring further screening, evaluation, or treatment in the ED at this time prior to discharge.       Final diagnoses:  Generalized abdominal pain    ED Discharge Orders     None          Emil Share, DO 08/14/23 1407

## 2023-08-14 NOTE — ED Triage Notes (Signed)
 C/o R side abd pain. Recent hernia surgery (7 weeks ago).  States worried about tearing something

## 2023-08-28 ENCOUNTER — Other Ambulatory Visit: Payer: Self-pay | Admitting: Emergency Medicine

## 2023-08-30 MED ORDER — POTASSIUM CHLORIDE CRYS ER 20 MEQ PO TBCR: 40.0000 meq | EXTENDED_RELEASE_TABLET | Freq: Every day | ORAL | 3 refills | Status: DC

## 2023-08-31 ENCOUNTER — Emergency Department (HOSPITAL_BASED_OUTPATIENT_CLINIC_OR_DEPARTMENT_OTHER)
Admission: EM | Admit: 2023-08-31 | Discharge: 2023-08-31 | Disposition: A | Discharge status: Home / Self Care | Attending: Emergency Medicine | Admitting: Emergency Medicine

## 2023-08-31 ENCOUNTER — Emergency Department (HOSPITAL_BASED_OUTPATIENT_CLINIC_OR_DEPARTMENT_OTHER)

## 2023-08-31 ENCOUNTER — Other Ambulatory Visit: Payer: Self-pay

## 2023-08-31 ENCOUNTER — Encounter (HOSPITAL_BASED_OUTPATIENT_CLINIC_OR_DEPARTMENT_OTHER): Payer: Self-pay | Admitting: Emergency Medicine

## 2023-08-31 DIAGNOSIS — Z91040 Latex allergy status: Secondary | ICD-10-CM | POA: Diagnosis not present

## 2023-08-31 DIAGNOSIS — Z7982 Long term (current) use of aspirin: Secondary | ICD-10-CM | POA: Insufficient documentation

## 2023-08-31 DIAGNOSIS — Z85828 Personal history of other malignant neoplasm of skin: Secondary | ICD-10-CM | POA: Diagnosis not present

## 2023-08-31 DIAGNOSIS — Z79899 Other long term (current) drug therapy: Secondary | ICD-10-CM | POA: Diagnosis not present

## 2023-08-31 DIAGNOSIS — W19XXXA Unspecified fall, initial encounter: Secondary | ICD-10-CM

## 2023-08-31 DIAGNOSIS — N182 Chronic kidney disease, stage 2 (mild): Secondary | ICD-10-CM | POA: Insufficient documentation

## 2023-08-31 DIAGNOSIS — S060X0A Concussion without loss of consciousness, initial encounter: Secondary | ICD-10-CM | POA: Insufficient documentation

## 2023-08-31 DIAGNOSIS — E1122 Type 2 diabetes mellitus with diabetic chronic kidney disease: Secondary | ICD-10-CM | POA: Insufficient documentation

## 2023-08-31 DIAGNOSIS — W01198A Fall on same level from slipping, tripping and stumbling with subsequent striking against other object, initial encounter: Secondary | ICD-10-CM | POA: Insufficient documentation

## 2023-08-31 DIAGNOSIS — S46911A Strain of unspecified muscle, fascia and tendon at shoulder and upper arm level, right arm, initial encounter: Secondary | ICD-10-CM | POA: Insufficient documentation

## 2023-08-31 DIAGNOSIS — I509 Heart failure, unspecified: Secondary | ICD-10-CM | POA: Diagnosis not present

## 2023-08-31 DIAGNOSIS — I130 Hypertensive heart and chronic kidney disease with heart failure and stage 1 through stage 4 chronic kidney disease, or unspecified chronic kidney disease: Secondary | ICD-10-CM | POA: Insufficient documentation

## 2023-08-31 DIAGNOSIS — S161XXA Strain of muscle, fascia and tendon at neck level, initial encounter: Secondary | ICD-10-CM | POA: Diagnosis not present

## 2023-08-31 MED ORDER — MORPHINE SULFATE (PF) 4 MG/ML IV SOLN
4.0000 mg | Freq: Once | INTRAVENOUS | Status: AC
  Administered 2023-08-31: 4 mg via INTRAMUSCULAR
  Filled 2023-08-31: qty 1

## 2023-08-31 NOTE — ED Provider Notes (Signed)
 [place] EMERGENCY DEPARTMENT AT [facility] Provider Note   CSN: [id number] Arrival date & time: 08/31/23  1120     Patient presents with: Randall [name] is a 68 y.o. male.   The history is provided by the patient.  Patient with history of CHF, CKD, diabetes presents after a fall. Patient reports around 5 AM he was doing work around the house when he lost his balance going down brick stairs He reports falling and hitting the right side of his body.  No LOC.  He takes aspirin  but no other anticoagulation  He was otherwise at his baseline.  He does take daily pain medicines, and also took muscle relaxant prior to arrival Past Medical History:  Diagnosis Date   Arthritis    CHF (congestive heart failure) (HCC)    CKD (chronic kidney disease) stage 2, GFR 60-89 ml/min    Complication of anesthesia    Diabetes mellitus without complication (HCC)    Elevated coronary artery calcium  score 02/02/2021   GERD (gastroesophageal reflux disease)    History of kidney stones 2014   Hypertension    Kidney stone 06/17/2015   Laryngeal mass    squamous carcinoid cancer   Lymphedema    PONV (postoperative nausea and vomiting)    Rupture of right quadriceps tendon 09/21/2020   - s/p srugical repair   Squamous cell carcinoma of neck    Thoracic aortic atherosclerosis (HCC)    Tuberculosis 1980   Type 2 diabetes mellitus without complication, without long-term current use of insulin  (HCC) 03/21/2020    Prior to Admission medications   Medication Sig Start Date End Date Taking? Authorizing Provider  amLODipine  (NORVASC ) 10 MG tablet Take 1 tablet (10 mg total) by mouth daily. 06/13/23   [name], Randall Sayres, MD  aspirin  EC 81 MG tablet Take 81 mg by mouth daily. Swallow whole.    [provider]  atorvastatin  (LIPITOR) 80 MG tablet Take 1 tablet (80 mg total) by mouth daily. 06/13/23   [name], Randall Sayres, MD  carvedilol  (COREG ) 25 MG tablet Take 25 mg by mouth 2 (two)  times daily with a meal.    [provider]  cetirizine (ZYRTEC) 10 MG tablet Take 10 mg by mouth every evening.    [provider]  dicyclomine  (BENTYL ) 20 MG tablet Take 1 tablet (20 mg total) by mouth 2 (two) times daily. 07/28/23   Barrett, Randall SAILOR, PA-C  furosemide  (LASIX ) 20 MG tablet Take 1 tablet (20 mg total) by mouth daily. Patient taking differently: Take 20 mg by mouth daily as needed for fluid or edema. 06/26/23   Randall Randall Randall [name]  HYDROcodone -acetaminophen  (HYCET) 7.5-325 mg/15 ml solution Take 10 mL up to TID as needed for pain - not to exceed 30mL daily. 02/27/23   Randall Domino, MD  loperamide  (IMODIUM  A-D) 2 MG tablet Take 2 mg by mouth 3 (three) times daily as needed for diarrhea or loose stools.    [provider]  meloxicam  (MOBIC ) 15 MG tablet Take 15 mg by mouth daily. 09/07/20   [provider]  Multiple Vitamin (MULTIVITAMIN PO) Take 1 tablet by mouth daily.    [provider]  ondansetron  (ZOFRAN ) 8 MG tablet Take 8 mg by mouth every 8 (eight) hours as needed for nausea or vomiting.    [provider]  OVER THE COUNTER MEDICATION Take 30 mLs by mouth every 6 (six) hours as needed (Pain). PainQuil    [provider]  oxymetazoline  (AFRIN) 0.05 % nasal spray Place 1 spray into both nostrils 2 (two) times daily as needed for congestion.    [provider]  pantoprazole  (PROTONIX ) 40 MG tablet Take 40 mg by mouth daily.    [provider]  polyethylene glycol (MIRALAX / GLYCOLAX) 17 g packet Take 17 g by mouth daily as needed for moderate constipation.    [provider]  potassium chloride  SA (KLOR-CON  M) 20 MEQ tablet Take 2 tablets (40 mEq total) by mouth daily. 08/30/23   Randall Randall Randall [name]  PREVIDENT 5000 ENAMEL PROTECT 1.1-5 % GEL Place 1 application  onto teeth at bedtime.    [provider]  tadalafil (CIALIS) 20 MG tablet Take 20 mg by mouth daily as needed for  erectile dysfunction. 07/27/20   [provider]  telmisartan (MICARDIS) 80 MG tablet Take 80 mg by mouth daily.    [provider]  TRULICITY 0.75 MG/0.5ML SOAJ Inject 0.75 mg into the skin once a week.    [provider]    Allergies: Norgesic forte [orphenadrine-aspirin -caffeine] and Latex    Review of Systems  Musculoskeletal:  Positive for arthralgias and neck pain.  Neurological:  Positive for headaches.    Updated Vital Signs BP (!) 101/44   Pulse 66   Temp 97.8 F (36.6 C) (Temporal)   Resp 14   SpO2 97%   Physical Exam CONSTITUTIONAL: Chronic ill-appearing, no acute distress HEAD: Normocephalic/atraumatic EYES: EOMI/PERRL ENMT: Mucous membranes moist, face is stable, no obvious nasal or facial trauma NECK: supple no meningeal signs SPINE/BACK: Cervical spine tenderness noted  No thoracic or lumbar tenderness  No bruising/crepitance/stepoffs noted to spine CV: S1/S2 noted, no murmurs/rubs/gallops noted LUNGS: Lungs are clear to auscultation bilaterally, no apparent distress ABDOMEN: soft, nontender, no bruising GU:no cva tenderness NEURO: Pt is awake/alert/appropriate, moves all extremitiesx4.  No facial droop.  GCS 15 EXTREMITIES: pulses normal/equal, full ROM Tenderness to range of motion of right hip and tenderness to palpation of right knee Pelvis stable Tenderness with palpation and range of motion of right shoulder, but no deformities. He is able to range R shoulder but limited due to pain All other extremities/joints palpated/ranged and nontender SKIN: warm, color normal  (all labs ordered are listed, but only abnormal results are displayed) Labs Reviewed - No data to display  EKG: None  Radiology: CT CERVICAL SPINE WO CONTRAST Result Date: 08/31/2023 CLINICAL DATA:  Randall [name].  Neck trauma.  Anticoagulated. EXAM: CT CERVICAL SPINE WITHOUT CONTRAST TECHNIQUE: Multidetector CT imaging of the cervical spine was performed without  intravenous contrast. Multiplanar CT image reconstructions were also generated. RADIATION DOSE REDUCTION: This exam was performed according to the departmental dose-optimization program which includes automated exposure control, adjustment of the mA and/or kV according to patient size and/or use of iterative reconstruction technique. COMPARISON:  None Available. FINDINGS: Alignment: Normal Skull base and vertebrae: No regional fracture or focal bone lesion. Soft tissues and spinal canal: No traumatic soft tissue finding. Disc levels: Ordinary degenerative spondylosis C5-6 and C6-7 with endplate osteophytes. Chronic fusion of the facet joints at C2-3. Chronic facet arthritis on the left at C3-4. Moderate bony canal narrowing at C5-6 and C6-7. Foraminal narrowing on the right at C5-6 and C6-7 and on the left at C3-4, C4-5, C5-6 and C6-7. Upper chest: Negative Other: None IMPRESSION: No acute or traumatic finding. Chronic degenerative changes as outlined above. Electronically Signed   By: Oneil Officer M.D.   On: 08/31/2023 12:32  DG Knee Right Port Result Date: 08/31/2023 CLINICAL DATA:  Status post fall.  On blood thinners. EXAM: PORTABLE RIGHT KNEE - 1-2 VIEW COMPARISON:  Radiographs 09/21/2020. FINDINGS: The bones appear adequately mineralized. No evidence of acute fracture or dislocation. Stable mild tricompartmental degenerative changes with meniscal chondrocalcinosis. Probable fragmented spurring of the distal quadriceps tendon, similar to previous CT. No significant residual knee joint effusion identified. IMPRESSION: No evidence of acute fracture or dislocation. Stable mild tricompartmental degenerative changes. Electronically Signed   By: Elsie Perone M.D.   On: 08/31/2023 12:31   CT MAXILLOFACIAL WO CONTRAST Result Date: 08/31/2023 CLINICAL DATA:  Fall with trauma to the right side of the face. Anticoagulated. EXAM: CT MAXILLOFACIAL WITHOUT CONTRAST TECHNIQUE: Multidetector CT imaging of the  maxillofacial structures was performed. Multiplanar CT image reconstructions were also generated. RADIATION DOSE REDUCTION: This exam was performed according to the departmental dose-optimization program which includes automated exposure control, adjustment of the mA and/or kV according to patient size and/or use of iterative reconstruction technique. COMPARISON:  None Available. FINDINGS: Osseous: No facial fracture. Orbits: No orbital injury. Sinuses: Sinuses are clear. Soft tissues: No significant soft tissue finding. Limited intracranial: Normal IMPRESSION: No facial fracture. Electronically Signed   By: Oneil Officer M.D.   On: 08/31/2023 12:31   DG Shoulder Right Port Result Date: 08/31/2023 CLINICAL DATA:  Status post fall.  On blood thinners. EXAM: RIGHT SHOULDER - 1 VIEW COMPARISON:  None Available. FINDINGS: The mineralization and alignment are normal. There is no evidence of acute fracture or dislocation. Mild-to-moderate acromioclavicular and glenohumeral degenerative changes bilaterally. No focal soft tissue abnormalities are identified. IMPRESSION: No evidence of acute fracture or dislocation. Degenerative changes as described. Electronically Signed   By: Elsie Perone M.D.   On: 08/31/2023 12:30   DG Pelvis Portable Result Date: 08/31/2023 CLINICAL DATA:  Status post fall.  On blood thinners. EXAM: PORTABLE PELVIS 1-2 VIEWS COMPARISON:  Abdominal radiographs 04/09/2010. Abdominopelvic CT 08/14/2023. FINDINGS: 1207 hours. The hips are externally rotated and suboptimally evaluated. No evidence of acute pelvic fracture or dislocation. Grossly stable superior endplate compression deformity at L5, lower lumbar spondylosis and mild sacroiliac degenerative changes. There are postsurgical changes in the low anterior abdominal wall. IMPRESSION: No evidence of acute pelvic fracture or dislocation. The hips are externally rotated and suboptimally evaluated. Electronically Signed   By: Elsie Perone M.D.    On: 08/31/2023 12:29   CT HEAD WO CONTRAST Result Date: 08/31/2023 CLINICAL DATA:  Fall.  Anticoagulated.  Trauma to the head. EXAM: CT HEAD WITHOUT CONTRAST TECHNIQUE: Contiguous axial images were obtained from the base of the skull through the vertex without intravenous contrast. RADIATION DOSE REDUCTION: This exam was performed according to the departmental dose-optimization program which includes automated exposure control, adjustment of the mA and/or kV according to patient size and/or use of iterative reconstruction technique. COMPARISON:  None Available. FINDINGS: Brain: The brain shows a normal appearance without evidence of malformation, atrophy, old or acute small or large vessel infarction, mass lesion, hemorrhage, hydrocephalus or extra-axial collection. Vascular: No hyperdense vessel. No evidence of atherosclerotic calcification. Skull: Normal.  No traumatic finding.  No focal bone lesion. Sinuses/Orbits: Sinuses are clear. Orbits appear normal. Mastoids are clear. Other: None significant IMPRESSION: Normal head CT. Electronically Signed   By: Oneil Officer M.D.   On: 08/31/2023 12:29   DG Chest Port 1 View Result Date: 08/31/2023 CLINICAL DATA:  Trauma. EXAM: PORTABLE CHEST 1 VIEW COMPARISON:  Chest radiograph dated 07/28/2023 FINDINGS: No  focal consolidation, pleural effusion, pneumothorax. Stable cardiac silhouette. No acute osseous pathology. IMPRESSION: No active disease. Electronically Signed   By: Vanetta Chou M.D.   On: 08/31/2023 12:27     Procedures   Medications Ordered in the ED  morphine  (PF) 4 MG/ML injection 4 mg (has no administration in time range)    Clinical Course as of 08/31/23 1317  Thu Aug 31, 2023  1206 Pt presents after mechanical fall presents with diffuse pain head/neck and extremities Imaging pending at this time [DW]  1315 No acute traumatic injuries.  Patient is able to ambulate Patient now demanding pain medicine, reports he has no medicines at  home.  On further discussion, he admits that he has Hycet at home which he takes BID.  I informed him that I would be unable to prescribe any further medicines, and patient became angry.  Patient began demanding morphine  Pt will be discharged [DW]    Clinical Course User Index [DW] Midge Golas, MD                                 Medical Decision Making Amount and/or Complexity of Data Reviewed Radiology: ordered.  Risk Prescription drug management.   This patient presents to the ED for concern of fall and head trauma, this involves an extensive number of treatment options, and is a complaint that carries with it a high risk of complications and morbidity.  The differential diagnosis includes but is not limited to subdural hematoma, subarachnoid hemorrhage, skull fracture, concussion   Comorbidities that complicate the patient evaluation: Patient's presentation is complicated by their history of diabetes  Social Determinants of Health: Patient's chronic pain  increases the complexity of managing their presentation  Additional history obtained: Additional history obtained from spouse Records reviewed outpatient records reviewed  Imaging Studies ordered: I ordered imaging studies including CT scan trauma imaging and X-ray extremity x-rays  I independently visualized and interpreted imaging which showed no acute traumatic injury I agree with the radiologist interpretation  Medicines ordered and prescription drug management: I ordered medication including morphine  for pain  Reevaluation: After the interventions noted above, I reevaluated the patient and found that they have :stayed the same  Complexity of problems addressed: Patient's presentation is most consistent with  acute presentation with potential threat to life or bodily function  Disposition: After consideration of the diagnostic results and the patient's response to treatment,  I feel that the patent would  benefit from discharge  .        Final diagnoses:  Fall, initial encounter  Concussion without loss of consciousness, initial encounter  Strain of neck muscle, initial encounter  Strain of right shoulder, initial encounter    ED Discharge Orders     None          Midge Golas, MD 08/31/23 1319

## 2023-08-31 NOTE — ED Notes (Signed)
 C-collar removed per Dr. Midge.  Patient was able to get up and walk to restroom with use of cane.

## 2023-08-31 NOTE — ED Triage Notes (Signed)
 Pt arrived with c/o fall, on blood thinners. States he hit his head and c/o pain on R side from head to toe. Denies LOC, but doesn't remember.

## 2023-09-12 ENCOUNTER — Ambulatory Visit: Admitting: Physician Assistant

## 2023-09-12 ENCOUNTER — Encounter: Payer: Self-pay | Admitting: Physician Assistant

## 2023-09-12 DIAGNOSIS — M1711 Unilateral primary osteoarthritis, right knee: Secondary | ICD-10-CM | POA: Diagnosis not present

## 2023-09-12 MED ORDER — LIDOCAINE HCL (PF) 1 % IJ SOLN
5.0000 mL | INTRAMUSCULAR | Status: AC | PRN
  Administered 2023-09-12: 5 mL

## 2023-09-12 MED ORDER — METHYLPREDNISOLONE ACETATE 40 MG/ML IJ SUSP
40.0000 mg | INTRAMUSCULAR | Status: AC | PRN
  Administered 2023-09-12: 40 mg via INTRA_ARTICULAR

## 2023-09-12 NOTE — Progress Notes (Signed)
 Office Visit Note   Patient: Randall [name]           Date of Birth: 1955-02-09           MRN: [id number] Visit Date: 09/12/2023              Requested by: Lynwood Laneta ORN, PA-C [id number],  [place] [address] PCP: Lynwood Laneta ORN, PA-C  Chief Complaint  Patient presents with   Right Leg - Pain      HPI:68 y/o male presents with right knee pain after a fall down some brick steps.  He has a history of multiple arthroscopies and a right quad tendon repair.  He is ambulating with a straight cane for support.  He states his right knee was swollen, but the swelling has improved.    Past medical history includes: CHF, CKD, diabetes, HTN, hypercholesterolemia, lymphedema and history of Laryngeal squamous cancer.  He wears compression and uses lymphedema pumps.       Assessment & Plan: Visit Diagnoses:  1. Arthritis of right knee     Plan: RICE of the right LE.  He received a right knee injection today, tolerated this well.  Ambulated as tolerates.    Follow-Up Instructions: Return if symptoms worsen or fail to improve.   Ortho Exam  Patient is alert, oriented, no adenopathy, well-dressed, normal affect, normal respiratory effort. Mild joint tenderness medially, no effusion, crepitus with active knee flexion and extension.  Palpable DP pulse B LE.      Imaging: Right knee x ray No evidence of acute fracture or dislocation. Stable mild tricompartmental degenerative changes.  Medial joint line narrowing.  Labs: Lab Results  Component Value Date   HGBA1C 5.2 06/07/2023   HGBA1C 5.6 12/19/2022   HGBA1C (H) 03/04/2010    8.6 (NOTE)                                                                       According to the ADA Clinical Practice Recommendations for 2011, when HbA1c is used as a screening test:   >=6.5%   Diagnostic of Diabetes Mellitus           (if abnormal result  is confirmed)  5.7-6.4%   Increased risk of developing Diabetes Mellitus   References:Diagnosis and Classification of Diabetes Mellitus,Diabetes Care,2011,34(Suppl 1):S62-S69 and Standards of Medical Care in         Diabetes - 2011,Diabetes Care,2011,34  (Suppl 1):S11-S61.   LABURIC 7.5 04/05/2023   REPTSTATUS 03/10/2010 FINAL 03/04/2010   CULT NO GROWTH 5 DAYS 03/04/2010   LABORGA ESCHERICHIA COLI 03/02/2010     Lab Results  Component Value Date   ALBUMIN 4.3 08/14/2023   ALBUMIN 4.6 07/28/2023   ALBUMIN 4.2 05/16/2023    Lab Results  Component Value Date   MG 1.7 06/11/2023   MG 2.2 04/05/2023   MG 1.9 04/05/2023   No results found for: VD25OH  No results found for: PREALBUMIN    Latest Ref Rng & Units 08/14/2023   12:04 PM 07/28/2023    5:43 PM 06/26/2023    4:56 PM  CBC EXTENDED  WBC 4.0 - 10.5 K/uL 7.3  6.1  6.6   RBC 4.22 - 5.81 MIL/uL  3.39  3.77  3.31   Hemoglobin 13.0 - 17.0 g/dL 89.2  87.9  89.5   HCT 39.0 - 52.0 % 31.7  35.6  31.5   Platelets 150 - 400 K/uL 195  194  209      There is no height or weight on file to calculate BMI.  Orders:  Orders Placed This Encounter  Procedures   Large Joint Inj   No orders of the defined types were placed in this encounter.    Procedures: Large Joint Inj: R knee on 09/12/2023 2:36 PM Indications: pain and diagnostic evaluation Details: 22 G 1.5 in needle  Arthrogram: No  Medications: 5 mL lidocaine  (PF) 1 %; 40 mg methylPREDNISolone  acetate 40 MG/ML Outcome: tolerated well, no immediate complications Procedure, treatment alternatives, risks and benefits explained, specific risks discussed. Consent was given by the patient. Immediately prior to procedure a time out was called to verify the correct patient, procedure, equipment, support staff and site/side marked as required. Patient was prepped and draped in the usual sterile fashion.      Clinical Data: No additional findings.  ROS:  All other systems negative, except as noted in the HPI. Review of Systems  Objective: Vital  Signs: There were no vitals taken for this visit.  Specialty Comments:  No specialty comments available.  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   Hyperlipidemia LDL goal <70 02/21/2022   Elevated coronary artery calcium  score-683 01/26/2021   Thoracic aortic atherosclerosis (HCC) 01/26/2021   Decreased left ventricular systolic function 12/25/2020   Severe hypertension 11/13/2020   Right bundle branch block 11/13/2020   Hypertensive heart disease with chronic diastolic congestive heart failure (HCC) 11/13/2020   Quadriceps tendon rupture, right, sequela    Type 2 diabetes mellitus with complication, without long-term current use of insulin  (HCC) 03/21/2020   Past Medical History:  Diagnosis Date   Arthritis    CHF (congestive heart failure) (HCC)    CKD (chronic kidney disease) stage 2, GFR 60-89 ml/min    Complication of anesthesia    Diabetes mellitus without complication (HCC)    Elevated coronary artery calcium  score 02/02/2021   GERD (gastroesophageal reflux disease)    History of kidney stones 2014   Hypertension    Kidney stone 06/17/2015   Laryngeal mass    squamous carcinoid cancer   Lymphedema    PONV (postoperative nausea and vomiting)    Rupture of right quadriceps tendon 09/21/2020   - s/p srugical repair   Squamous cell carcinoma of neck    Thoracic aortic atherosclerosis (HCC)    Tuberculosis 1980   Type 2 diabetes mellitus without complication, without long-term current use of insulin  (HCC) 03/21/2020    History reviewed. No pertinent family history.   Past Surgical History:  Procedure Laterality Date   INGUINAL HERNIA REPAIR Bilateral 06/15/2023   Procedure: REPAIR, HERNIA, INGUINAL, LAPAROSCOPIC;  Surgeon: Rubin Calamity, MD;  Location: [facility] OR;  Service: General;  Laterality: Bilateral;  LAPAROSCOPIC BILATERAL INGUINAL HERNIA REPAIR WITH MESH   KIDNEY SURGERY     KNEE SURGERY     LEFT  HEART CATH AND CORONARY ANGIOGRAPHY N/A 06/12/2023   Procedure: LEFT HEART CATH AND CORONARY ANGIOGRAPHY;  Surgeon: Anner Alm ORN, MD;  Location[facility] INVASIVE CV LAB;  Service: Cardiovascular;  Laterality: N/A;   LITHOTRIPSY     MICROLARYNGOSCOPY Bilateral 06/17/2022   Procedure: DIRECT LARYNGOSCOPY WITH BIOPSY;  Surgeon: Llewellyn Gerard LABOR, DO;  Location: [facility] OR;  Service: ENT;  Laterality: Bilateral;   QUADRICEPS TENDON REPAIR Right 09/25/2020   Procedure: REPAIR RIGHT QUADRICEP TENDON;  Surgeon: Harden Jerona GAILS, MD;  Location: [facility] OR;  Service: Orthopedics;  Laterality: Right;   TONSILLECTOMY     as a child   Social History   Occupational History   Not on file  Tobacco Use   Smoking status: Never   Smokeless tobacco: Never  Vaping Use   Vaping status: Never Used  Substance and Sexual Activity   Alcohol use: Never   Drug use: Never   Sexual activity: Not Currently

## 2023-09-22 ENCOUNTER — Encounter (HOSPITAL_BASED_OUTPATIENT_CLINIC_OR_DEPARTMENT_OTHER): Payer: Self-pay | Admitting: *Deleted

## 2023-09-25 ENCOUNTER — Encounter: Payer: Self-pay | Admitting: *Deleted

## 2023-09-26 ENCOUNTER — Encounter: Payer: Self-pay | Admitting: Emergency Medicine

## 2023-09-26 ENCOUNTER — Ambulatory Visit: Attending: Emergency Medicine | Admitting: Emergency Medicine

## 2023-09-26 VITALS — BP 136/72 | HR 86 | Ht 70.0 in | Wt 142.0 lb

## 2023-09-26 DIAGNOSIS — I110 Hypertensive heart disease with heart failure: Secondary | ICD-10-CM | POA: Diagnosis not present

## 2023-09-26 DIAGNOSIS — E871 Hypo-osmolality and hyponatremia: Secondary | ICD-10-CM | POA: Diagnosis not present

## 2023-09-26 DIAGNOSIS — I890 Lymphedema, not elsewhere classified: Secondary | ICD-10-CM

## 2023-09-26 DIAGNOSIS — I1A0 Resistant hypertension: Secondary | ICD-10-CM

## 2023-09-26 DIAGNOSIS — E118 Type 2 diabetes mellitus with unspecified complications: Secondary | ICD-10-CM

## 2023-09-26 DIAGNOSIS — I5032 Chronic diastolic (congestive) heart failure: Secondary | ICD-10-CM

## 2023-09-26 DIAGNOSIS — I2510 Atherosclerotic heart disease of native coronary artery without angina pectoris: Secondary | ICD-10-CM

## 2023-09-26 DIAGNOSIS — E785 Hyperlipidemia, unspecified: Secondary | ICD-10-CM

## 2023-09-26 MED ORDER — POTASSIUM CHLORIDE CRYS ER 20 MEQ PO TBCR: EXTENDED_RELEASE_TABLET | ORAL | 1 refills | Status: DC

## 2023-09-26 MED ORDER — POTASSIUM CHLORIDE CRYS ER 20 MEQ PO TBCR: 20.0000 meq | EXTENDED_RELEASE_TABLET | Freq: Every day | ORAL | 1 refills | Status: DC

## 2023-09-26 MED ORDER — FUROSEMIDE 20 MG PO TABS: 20.0000 mg | ORAL_TABLET | Freq: Every day | ORAL | 1 refills | Status: DC | PRN

## 2023-09-26 NOTE — Progress Notes (Signed)
 Cardiology Office Note:    Date:  09/26/2023  ID:  Randall [name], DOB 1955-06-17, MRN [id number] PCP: Randall Laneta LELON DEVONNA  [name] Providers Cardiologist:  Randall Clay, MD Cardiology APP:  Randall Lum CROME, NP       Patient Profile:       Chief Complaint: 3-month follow-up History of Present Illness:  Randall [name] is a 68 y.o. male with visit-pertinent history of  resistant hypertension, chronic diastolic heart failure, HLD, elevated coronary artery calcium  score, thoracic aortic atherosclerosis, right bundle branch block, T2DM, laryngeal cancer   He established care with cardiology service in November 2022 for abnormal EKG and hypertension management.  He was on 3 antihypertensive agents with persistently elevated blood pressures and his EKG showed right bundle branch block.  Echocardiogram was ordered and completed 12/08/2020 showing LVEF 45-50%, global hypokinesis, grade 1 DD, mild mitral valve regurgitation, mild calcification of the aortic valve.  Decreased LV function was thought to be in the setting of uncontrolled HTN.  Bundle branch block thought to be related to wall motion abnormality.  CT cardiac scoring completed on 02/02/2021 showing coronary calcium  score 683, 87 percentile for age, race, sex and over read interpretation CT chest showing aortic atherosclerosis.   Seen in clinic on 02/18/2022 and was without any active cardiovascular symptoms.  His blood pressure was still borderline high and his chlorthalidone  was titrated up to 25 mg.  He was on combination of amlodipine  10 mg, carvedilol  25 mg twice daily, telmisartan 80 mg daily, chlorthalidone  25 mg daily.   Seen in clinic on 01/12/2023.  Noted to experience significant increase in his chronic lower extremity edema.  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  His Lasix  was increased to 40 mg daily.  Echocardiogram ordered showing LVEF 55-60%, grade 2 DD, RV SF  normal, RV moderately enlarged, left atrial size severely dilated, no valvular abnormalities   Patient was recently admitted on 06/10/2023 for chest pain.  He presented with 3 days of left arm discomfort with associated midsternal chest pain. ECG with RBBB/LAFB which is intermittent from 09/25/20 with some ECG with normal conduction and others with 1' AVB, RBBB and LAFB. No ischemic changes.  He underwent cardiac catheterization on 06/12/2023 showing moderate single-vessel disease with 60% RPDA stenosis with normal LVEF and normal EDP.  Echocardiogram 06/12/2023 showed LVEF 55 to 60%, inferolateral hypokinesis, grade 2 DD, RV function size normal, mild mitral valve regurgitation.  Chlorthalidone  was discontinued and he was started on amlodipine  due to hyponatremia.  He was last seen in clinic on 06/26/2023.  His chest pains had entirely resolved and feels much improved.  He reported that he engages in extensive physical activities for 16 to 20 hours a day, 7 days a week.  His physical activity coupled with poor sleep has contributed to his exhaustion while managing his wife's poor health and medical appointments.  He had been experiencing leg swelling particularly after discontinuation of chlorthalidone  and furosemide  in the hospital.  He had self-started taking his prior 20 mg of Lasix  as well as his chlorthalidone  due to fluid retention.  His chlorthalidone  was discontinued once more during office visit due to his history of hyponatremia.  He was to continue Lasix  as needed.  He was currently drinking 220 ml of fluid due to dry mouth s/p history of radiation therapy.  I have asked him to decrease his fluid intake due to possible sodium dilution.  Follow-up labs showed normalization  in his electrolytes after decreasing his fluid intake.   Discussed the use of AI scribe software for clinical note transcription with the patient, who gave verbal consent to proceed.  History of Present Illness Randall [name] is a 68 year old male who presents for follow-up.  Today he is doing well without significant or acute cardiovascular concerns or complaints today.  Tells me his leg swelling has significantly improved with the reduction of fluid he is currently drinking.  Previously drinking 220 mL daily and now drinking 60-80 mL daily.  He uses lymphedema pump for his leg swelling only once every 1 to 2 weeks.  He also uses a lymphedema pump/vest for his upper body daily.  He is without any chest pains, dyspnea, orthopnea, PND.  Denies any syncope, presyncope, palpitations, melena, hematochezia.  Overall happy with his cardiovascular health.  Review of systems:  Please see the history of present illness. All other systems are reviewed and otherwise negative.      Studies Reviewed:        Echocardiogram 06/12/2023 1. Left ventricular ejection fraction, by estimation, is 55 to 60%. Left  ventricular ejection fraction by 2D MOD biplane is 57.6 %. The left  ventricle has normal function. The left ventricle demonstrates regional  wall motion abnormalities with  inferolateral hypokinesis. Left ventricular diastolic parameters are  consistent with Grade II diastolic dysfunction (pseudonormalization).   2. Right ventricular systolic function is normal. The right ventricular  size is normal. Tricuspid regurgitation signal is inadequate for assessing  PA pressure.   3. Left atrial size was mildly dilated.   4. The mitral valve is normal in structure. Mild mitral valve  regurgitation. No evidence of mitral stenosis.   5. The aortic valve is tricuspid. There is mild calcification of the  aortic valve. Aortic valve regurgitation is not visualized. No aortic  stenosis is present.   6. The inferior vena cava is dilated in size with <50% respiratory  variability, suggesting right atrial pressure of 15 mmHg.      Cardiac catheterization 06/12/2023 POST-OPERATIVE DIAGNOSIS:   Moderate single-vessel disease with  60% RPDA stenosis and a codominant system. Normal LVEF with normal EDP   RECOMMENDATIONS:     In the absence of any other complications or medical issues, we expect the patient to be ready for discharge from a cath perspective on 06/12/2023.   Recommend Aspirin  81mg  daily for moderate CAD. Diagnostic Dominance: Co-dominant   Risk Assessment/Calculations:              Physical Exam:   VS:  BP 136/72 (BP Location: Left Arm, Patient Position: Sitting, Cuff Size: Normal)   Pulse 86   Ht 5' 10 (1.778 m)   Wt 142 lb (64.4 kg)   BMI 20.37 kg/m    Wt Readings from Last 3 Encounters:  09/26/23 142 lb (64.4 kg)  06/26/23 165 lb (74.8 kg)  06/15/23 159 lb 11.2 oz (72.4 kg)    GEN: Well nourished, well developed in no acute distress NECK: No JVD; No carotid bruits CARDIAC: RRR, no murmurs, rubs, gallops RESPIRATORY:  Clear to auscultation without rales, wheezing or rhonchi  ABDOMEN: Soft, non-tender, non-distended EXTREMITIES:  No edema; No acute deformity      Assessment and Plan:  Coronary artery disease LHC 06/2023 with moderate single-vessel disease with 60% RPDA stenosis and a codominant system, normal LVEF and EDP Echocardiogram 06/2023 with LVEF 55 to 60% - No reported anginal symptoms.  He remains physically active  without exertional symptoms. There is no indication for further ischemic evaluation or antianginal therapy at this time - Continue aspirin  81 mg daily, atorvastatin  80 mg daily, telmisartan 80 mg daily, carvedilol  25 mg twice daily   Chronic diastolic heart failure Hypertensive heart disease with grade 2 DD Echocardiogram 06/2023 with LVEF 55 to 60%, inferolateral hypokinesis, grade 2 DD - Today he appears euvolemic and well compensated on exam.  Not having to take daily loop diuretic at this time.  Did see improvement in lower extremity swelling and hyponatremia when he decreased his water intake from 220 mL daily to 60-80 mL daily.  He was frequently drinking water  due to dry mouth from cancer history now controlled with Biotene - Chlorthalidone  discontinued in the past due to hyponatremia.  Blood pressure well-controlled in office today - Continue furosemide  20 mg as needed with potassium supplementation, carvedilol  25 mg twice daily, telmisartan 80 mg daily   Resistant hypertension Blood pressure today is 136/72 and well-controlled - Continue amlodipine  10 mg daily, carvedilol  25 mg twice daily, telmisartan 80 mg daily - Maintain home BP log   Hyperlipidemia, LDL goal <70 LDL 54 on 06/2023 and well-controlled Lipoprotein (A) 101.1 on 06/2023 - Continue atorvastatin  80 mg daily   T2DM A1c 5.2% on 05/2023 - Continue current medical therapy with Trulicity - Managed by PCP   Lymphedema 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  Possible sodium dilution due to drinking 220 mL daily due to dry mouth      Dispo:  Return in about 6 weeks (around 11/07/2023).  Signed, Lum LITTIE Louis, NP

## 2023-09-26 NOTE — Patient Instructions (Addendum)
 Medication Instructions:  TAKE LASIX  20 MG AS NEEDED. TAKE POTASSIUM CHLORIDE  20 MEQ AS NEEDED WHEN TAKING THE LASIX .   Lab Work: NONE TO BE DONE TODAY.   Testing/Procedures: NONE  Follow-Up: At [facility]: [facility], you and your health needs are our priority.  As part of our continuing mission to provide you with exceptional heart care, our providers are all part of one team.  This team includes your primary Cardiologist (physician) and Advanced Practice Providers or APPs (Physician Assistants and Nurse Practitioners) who all work together to provide you with the care you need, when you need it.  Your next appointment:   6 WEEKS.  Provider:   MADISON FOUNTAIN, NP

## 2023-10-09 ENCOUNTER — Encounter (INDEPENDENT_AMBULATORY_CARE_PROVIDER_SITE_OTHER): Payer: Self-pay | Admitting: Otolaryngology

## 2023-10-09 ENCOUNTER — Ambulatory Visit (INDEPENDENT_AMBULATORY_CARE_PROVIDER_SITE_OTHER): Admitting: Otolaryngology

## 2023-10-09 VITALS — BP 159/86 | HR 81 | Temp 98.0°F

## 2023-10-09 DIAGNOSIS — R040 Epistaxis: Secondary | ICD-10-CM

## 2023-10-09 DIAGNOSIS — J3089 Other allergic rhinitis: Secondary | ICD-10-CM | POA: Diagnosis not present

## 2023-10-09 DIAGNOSIS — R0981 Nasal congestion: Secondary | ICD-10-CM

## 2023-10-09 DIAGNOSIS — J3489 Other specified disorders of nose and nasal sinuses: Secondary | ICD-10-CM | POA: Diagnosis not present

## 2023-10-09 DIAGNOSIS — J342 Deviated nasal septum: Secondary | ICD-10-CM | POA: Diagnosis not present

## 2023-10-09 DIAGNOSIS — J343 Hypertrophy of nasal turbinates: Secondary | ICD-10-CM | POA: Diagnosis not present

## 2023-10-09 DIAGNOSIS — K117 Disturbances of salivary secretion: Secondary | ICD-10-CM

## 2023-10-09 DIAGNOSIS — R0982 Postnasal drip: Secondary | ICD-10-CM

## 2023-10-09 DIAGNOSIS — H903 Sensorineural hearing loss, bilateral: Secondary | ICD-10-CM

## 2023-10-09 MED ORDER — LEVOCETIRIZINE DIHYDROCHLORIDE 5 MG PO TABS: 5.0000 mg | ORAL_TABLET | Freq: Every evening | ORAL | 3 refills | Status: DC

## 2023-10-09 MED ORDER — FLUTICASONE PROPIONATE 50 MCG/ACT NA SUSP: 2.0000 | Freq: Two times a day (BID) | NASAL | 6 refills | Status: DC

## 2023-10-09 NOTE — Progress Notes (Signed)
 ENT CONSULT:  Reason for Consult: hx of hypopharyngeal and arytenoid SCCa s/p surgery and cXRT   HPI: Discussed the use of AI scribe software for clinical note transcription with the patient, who gave verbal consent to proceed.  History of Present Illness Randall [name] is a 68 year old male with a history of head and neck cancer (R hypopharyngeal and arytenoid SCCa) who presents with chronic nasal congestion and hearing issues.  He has significant nasal congestion, described as 'a big ball of snot' in his nose, leading to frequent epistaxis. Saline solution provides some relief, but prescribed medications have been ineffective. Mucinex Sinus Max alleviated congestion but increased his blood pressure, leading to discontinuation. He uses a neti pot, but the saline often returns to his mouth, which he finds concerning.  He has a history of head and neck cancer, diagnosed after a prolonged period from January to June, followed by surgery and radiation therapy. Post-surgery, he had drain tubes and a feeding tube due to low sodium levels and difficulty eating. Post-radiation, he developed severe lymphedema, described as looking like a 'bullfrog' with swelling in his neck. Despite speech therapy and exercises, the lymphedema persists, and he now uses a lymphedema pump twice daily.  He experienced a sudden loss of hearing a month ago, leaving him unable to hear. There was a delay in receiving a hearing test, and he was eventually advised to try hearing aids, which resulted in a constant background roar. He now uses O[employer]on hearing aids, which have improved his ability to hear environmental sounds like birds.   Records Reviewed:  Dr Lauralee 09/06/23 Mr. Hehn returns for f/u visit.   He first noted a right neck mass in early 2024. Imaging showed a hypopharynx mass, with a solitary lymph node.   He underwent endoscopic CO2 laser excision and bilateral neck dissection on 08/26/22  He  underwent postop RT with Dr Izell, completed 11/18/22   He has had a number of problems  - lymphedema continues, wears a velcro wrap - weakness in holding his head upright (cannot wear his motorcycle helmet) - seems better now  No new neck masses or enlarged lymph nodes.   He is able to swallow most types of foods but appetite is poor. Drinking protein drinks. Working with SLP at [employer] . No signs of aspiration. Having nausea, diarrhea, working with GI.   Impression   Right arytenoid / hypo pharynx SCCa, HPV+, s/p endoscopic CO2 laser resection and bilateral neck dissection on 08/26/22. Pathology with fragmented main tumor specimen, 1/24 right neck nodes (4 cm, no ENE), 0/8 left neck nodes. He underwent postop RT , completed 11/18/22 There is no evidence of disease on exam today.  He has f/u planned with Dr Izell in October  Audiogram 03/21/22 03/21/2023 Audiogram was independently reviewed and interpreted by me and it reveals Right ear: mild to moderately-severe SNHL; 80% word recognition at 80 dB; type A tympanogram Left ear: mild to moderately-severe SNHL; 88% word recognition at 80 dB; type A tympanogram SNHL= sensorineural hearing loss   He reports sudden hearing loss in both ears following exposure to a loud noise about 2 weeks ago. An audiogram reveals moderately severe SNHL in both ears (slightly worse on the right side). No significant asymmetry. There is no previous comparison. Since the loss is acute, we will start medrol  dose pak and he will return for f/u in 2-3 weeks for f/u with another audiogram.     Past Medical History:  Diagnosis  Date   Arthritis    CHF (congestive heart failure) (HCC)    CKD (chronic kidney disease) stage 2, GFR 60-89 ml/min    Complication of anesthesia    Diabetes mellitus without complication (HCC)    Elevated coronary artery calcium  score 02/02/2021   GERD (gastroesophageal reflux disease)    History of kidney stones 2014   Hypertension     Kidney stone 06/17/2015   Laryngeal mass    squamous carcinoid cancer   Lymphedema    PONV (postoperative nausea and vomiting)    Rupture of right quadriceps tendon 09/21/2020   - s/p srugical repair   Squamous cell carcinoma of neck    Thoracic aortic atherosclerosis    Tuberculosis 1980   Type 2 diabetes mellitus without complication, without long-term current use of insulin  (HCC) 03/21/2020    Past Surgical History:  Procedure Laterality Date   INGUINAL HERNIA REPAIR Bilateral 06/15/2023   Procedure: REPAIR, HERNIA, INGUINAL, LAPAROSCOPIC;  Surgeon: Rubin Calamity, MD;  Location: [facility] OR;  Service: General;  Laterality: Bilateral;  LAPAROSCOPIC BILATERAL INGUINAL HERNIA REPAIR WITH MESH   KIDNEY SURGERY     KNEE SURGERY     LEFT HEART CATH AND CORONARY ANGIOGRAPHY N/A 06/12/2023   Procedure: LEFT HEART CATH AND CORONARY ANGIOGRAPHY;  Surgeon: Anner Alm ORN, MD;  Location: [facility] INVASIVE CV LAB;  Service: Cardiovascular;  Laterality: N/A;   LITHOTRIPSY     MICROLARYNGOSCOPY Bilateral 06/17/2022   Procedure: DIRECT LARYNGOSCOPY WITH BIOPSY;  Surgeon: Llewellyn Gerard LABOR, DO;  Location: [facility] OR;  Service: ENT;  Laterality: Bilateral;   QUADRICEPS TENDON REPAIR Right 09/25/2020   Procedure: REPAIR RIGHT QUADRICEP TENDON;  Surgeon: Harden Jerona GAILS, MD;  Location: [facility] OR;  Service: Orthopedics;  Laterality: Right;   TONSILLECTOMY     as a child    No family history on file.  Social History:  reports that he has never smoked. He has never used smokeless tobacco. He reports that he does not drink alcohol and does not use drugs.  Allergies:  Allergies  Allergen Reactions   Norgesic Forte [Orphenadrine-Aspirin -Caffeine] Other (See Comments)    Unknown reaction    Latex Hives and Rash    Blisters    Medications: I have reviewed the patient's current medications.  The PMH, PSH, Medications, Allergies, and SH were reviewed and updated.  ROS: Constitutional: Negative for fever, weight  loss and weight gain. Cardiovascular: Negative for chest pain and dyspnea on exertion. Respiratory: Is not experiencing shortness of breath at rest. Gastrointestinal: Negative for nausea and vomiting. Neurological: Negative for headaches. Psychiatric: The patient is not nervous/anxious  Blood pressure (!) 159/86, pulse 81, temperature 98 F (36.7 C), SpO2 99%. There is no height or weight on file to calculate BMI.  PHYSICAL EXAM:  Exam: General: Well-developed, well-nourished Respiratory Respiratory effort: Equal inspiration and expiration without stridor Cardiovascular Peripheral Vascular: Warm extremities with equal color/perfusion Eyes: No nystagmus with equal extraocular motion bilaterally Neuro/Psych/Balance: Patient oriented to person, place, and time; Appropriate mood and affect; Gait is intact with no imbalance; Cranial nerves I-XII are intact Head and Face Inspection: Normocephalic and atraumatic without mass or lesion Palpation: Facial skeleton intact without bony stepoffs Salivary Glands: No mass or tenderness Facial Strength: Facial motility symmetric and full bilaterally ENT Pinna: External ear intact and fully developed External canal: Canal is patent with intact skin Tympanic Membrane: Clear and mobile External Nose: No scar or anatomic deformity Internal Nose: Septum is deviated to the right and with narrowing of  both nasal passages due to S-shape configuration. No polyp, or purulence. Mucosal edema and erythema present.  Bilateral inferior turbinate hypertrophy.  Lips, Teeth, and gums: Mucosa and teeth intact and viable TMJ: No pain to palpation with full mobility Oral cavity/oropharynx: No erythema or exudate, no lesions present Nasopharynx: No mass or lesion with intact mucosa Neck Neck and Trachea: Midline trachea without mass or lesion Thyroid : No mass or nodularity Lymphatics: No lymphadenopathy  Procedure:   PROCEDURE NOTE: nasal  endoscopy  Preoperative diagnosis: chronic sinusitis symptoms  Postoperative diagnosis: same  Procedure: Diagnostic nasal endoscopy ([id number])  Surgeon: Elena Larry, M.D.  Anesthesia: Topical lidocaine  and Afrin  H&P REVIEW: The patient's history and physical were reviewed today prior to procedure. All medications were reviewed and updated as well. Complications: None Condition is stable throughout exam Indications and consent: The patient presents with symptoms of chronic sinusitis not responding to previous therapies. All the risks, benefits, and potential complications were reviewed with the patient preoperatively and informed consent was obtained. The time out was completed with confirmation of the correct procedure.   Procedure: The patient was seated upright in the clinic. Topical lidocaine  and Afrin were applied to the nasal cavity. After adequate anesthesia had occurred, the rigid nasal endoscope was passed into the nasal cavity. The nasal mucosa, turbinates, septum, and sinus drainage pathways were visualized bilaterally. This revealed no purulence or significant secretions that might be cultured. There were no polyps or sites of significant inflammation. The mucosa was intact and there was no crusting present. The scope was then slowly withdrawn and the patient tolerated the procedure well. There were no complications or blood loss.   Studies Reviewed: CT max/face 08/31/23 FINDINGS: Osseous: No facial fracture.   Orbits: No orbital injury.   Sinuses: Sinuses are clear.   Soft tissues: No significant soft tissue finding.   Limited intracranial: Normal   IMPRESSION: No facial fracture.  Assessment/Plan: Encounter Diagnoses  Name Primary?   Chronic nasal congestion Yes   Environmental and seasonal allergies    Post-nasal drip    Nasal septal deviation    Hypertrophy of both inferior nasal turbinates    Assessment & Plan Chronic nasal obstruction due to septal  deviation and turbinate hypertrophy as well as environmental allergies Significant septal deviation and turbinate hypertrophy causing chronic nasal obstruction noted on nasal endoscopy today with no polyps or pus. CT scan shows clear sinuses, indicating the issue is primarily in the nasal passages. Symptoms include severe nasal congestion and difficulty breathing through the nose. Nasal saline rinses are being used, but the solution comes back through the mouth, possibly due to radiation effects on the soft palate. - Recommend watching YouTube videos on proper nasal saline rinse technique. - Discussed potential future septoplasty/ITR to correct septal deviation. - Prescribe a different antihistamine, Xyzal 5 mg daily  - Consider allergy testing in the future if symptoms persist.  Recurrent epistaxis Recurrent epistaxis associated with nasal congestion. Previous use of Flonase and Astelin exacerbated bleeding. - Avoid Flonase and Astelin due to exacerbation of bleeding.  Chronic oropharyngeal dryness (xerostomia) post-radiation Chronic dryness of the oropharynx following radiation therapy. He reports drinking fluids and using Biotene mouth tablets to manage symptoms. - Continue using Biotene and Xylimelts   SNHL, bilateral  - already has hearing aids - continue to wear - annual hearing test    Thank you for allowing me to participate in the care of this patient. Please do not hesitate to contact me with any questions or  concerns.   Elena Larry, MD Otolaryngology [facility] Phone: [phone] Fax: [phone]    10/09/2023, 11:29 AM

## 2023-10-09 NOTE — Patient Instructions (Addendum)
 Consider septoplasty surgery in the future   Neil Med Nasal Saline Rinse   - start nasal saline rinses with NeilMed Bottle available over the counter or online to help with nasal congestion

## 2023-10-09 NOTE — Progress Notes (Signed)
 Patient in stressful situation.

## 2023-10-21 ENCOUNTER — Inpatient Hospital Stay (HOSPITAL_COMMUNITY)
Admission: EM | Admit: 2023-10-21 | Discharge: 2023-10-24 | DRG: 291 | Disposition: A | Discharge status: Home / Self Care | Source: Other Acute Inpatient Hospital | Attending: Internal Medicine | Admitting: Internal Medicine

## 2023-10-21 ENCOUNTER — Emergency Department (HOSPITAL_COMMUNITY)

## 2023-10-21 ENCOUNTER — Encounter (HOSPITAL_COMMUNITY): Payer: Self-pay | Admitting: *Deleted

## 2023-10-21 ENCOUNTER — Other Ambulatory Visit: Payer: Self-pay

## 2023-10-21 DIAGNOSIS — E785 Hyperlipidemia, unspecified: Secondary | ICD-10-CM | POA: Diagnosis present

## 2023-10-21 DIAGNOSIS — I160 Hypertensive urgency: Secondary | ICD-10-CM | POA: Diagnosis present

## 2023-10-21 DIAGNOSIS — M1990 Unspecified osteoarthritis, unspecified site: Secondary | ICD-10-CM | POA: Diagnosis present

## 2023-10-21 DIAGNOSIS — N182 Chronic kidney disease, stage 2 (mild): Secondary | ICD-10-CM | POA: Diagnosis present

## 2023-10-21 DIAGNOSIS — R0602 Shortness of breath: Secondary | ICD-10-CM | POA: Diagnosis present

## 2023-10-21 DIAGNOSIS — Z791 Long term (current) use of non-steroidal anti-inflammatories (NSAID): Secondary | ICD-10-CM

## 2023-10-21 DIAGNOSIS — I700 Atherosclerosis of aorta: Secondary | ICD-10-CM | POA: Diagnosis present

## 2023-10-21 DIAGNOSIS — Z8679 Personal history of other diseases of the circulatory system: Secondary | ICD-10-CM

## 2023-10-21 DIAGNOSIS — I4510 Unspecified right bundle-branch block: Secondary | ICD-10-CM | POA: Diagnosis present

## 2023-10-21 DIAGNOSIS — J9601 Acute respiratory failure with hypoxia: Secondary | ICD-10-CM | POA: Diagnosis present

## 2023-10-21 DIAGNOSIS — Z7985 Long-term (current) use of injectable non-insulin antidiabetic drugs: Secondary | ICD-10-CM | POA: Diagnosis not present

## 2023-10-21 DIAGNOSIS — R7989 Other specified abnormal findings of blood chemistry: Secondary | ICD-10-CM | POA: Insufficient documentation

## 2023-10-21 DIAGNOSIS — K5903 Drug induced constipation: Secondary | ICD-10-CM | POA: Diagnosis present

## 2023-10-21 DIAGNOSIS — Z1152 Encounter for screening for COVID-19: Secondary | ICD-10-CM | POA: Diagnosis not present

## 2023-10-21 DIAGNOSIS — J9811 Atelectasis: Secondary | ICD-10-CM | POA: Diagnosis present

## 2023-10-21 DIAGNOSIS — Z886 Allergy status to analgesic agent status: Secondary | ICD-10-CM

## 2023-10-21 DIAGNOSIS — F1120 Opioid dependence, uncomplicated: Secondary | ICD-10-CM | POA: Diagnosis present

## 2023-10-21 DIAGNOSIS — Z7982 Long term (current) use of aspirin: Secondary | ICD-10-CM | POA: Diagnosis not present

## 2023-10-21 DIAGNOSIS — I890 Lymphedema, not elsewhere classified: Secondary | ICD-10-CM | POA: Diagnosis not present

## 2023-10-21 DIAGNOSIS — I5043 Acute on chronic combined systolic (congestive) and diastolic (congestive) heart failure: Secondary | ICD-10-CM | POA: Diagnosis present

## 2023-10-21 DIAGNOSIS — I2510 Atherosclerotic heart disease of native coronary artery without angina pectoris: Secondary | ICD-10-CM | POA: Diagnosis present

## 2023-10-21 DIAGNOSIS — Z85818 Personal history of malignant neoplasm of other sites of lip, oral cavity, and pharynx: Secondary | ICD-10-CM

## 2023-10-21 DIAGNOSIS — E1122 Type 2 diabetes mellitus with diabetic chronic kidney disease: Secondary | ICD-10-CM | POA: Diagnosis present

## 2023-10-21 DIAGNOSIS — Y842 Radiological procedure and radiotherapy as the cause of abnormal reaction of the patient, or of later complication, without mention of misadventure at the time of the procedure: Secondary | ICD-10-CM | POA: Diagnosis present

## 2023-10-21 DIAGNOSIS — I5031 Acute diastolic (congestive) heart failure: Secondary | ICD-10-CM | POA: Diagnosis not present

## 2023-10-21 DIAGNOSIS — Z85828 Personal history of other malignant neoplasm of skin: Secondary | ICD-10-CM

## 2023-10-21 DIAGNOSIS — E871 Hypo-osmolality and hyponatremia: Secondary | ICD-10-CM | POA: Diagnosis present

## 2023-10-21 DIAGNOSIS — Z87442 Personal history of urinary calculi: Secondary | ICD-10-CM

## 2023-10-21 DIAGNOSIS — Z8521 Personal history of malignant neoplasm of larynx: Secondary | ICD-10-CM

## 2023-10-21 DIAGNOSIS — Z923 Personal history of irradiation: Secondary | ICD-10-CM | POA: Diagnosis not present

## 2023-10-21 DIAGNOSIS — G8929 Other chronic pain: Secondary | ICD-10-CM | POA: Diagnosis present

## 2023-10-21 DIAGNOSIS — Z79899 Other long term (current) drug therapy: Secondary | ICD-10-CM

## 2023-10-21 DIAGNOSIS — I2489 Other forms of acute ischemic heart disease: Secondary | ICD-10-CM | POA: Diagnosis present

## 2023-10-21 DIAGNOSIS — T40605A Adverse effect of unspecified narcotics, initial encounter: Secondary | ICD-10-CM | POA: Diagnosis present

## 2023-10-21 DIAGNOSIS — K219 Gastro-esophageal reflux disease without esophagitis: Secondary | ICD-10-CM | POA: Diagnosis present

## 2023-10-21 DIAGNOSIS — E119 Type 2 diabetes mellitus without complications: Secondary | ICD-10-CM

## 2023-10-21 DIAGNOSIS — I5021 Acute systolic (congestive) heart failure: Secondary | ICD-10-CM

## 2023-10-21 DIAGNOSIS — I10 Essential (primary) hypertension: Secondary | ICD-10-CM | POA: Diagnosis not present

## 2023-10-21 DIAGNOSIS — I5041 Acute combined systolic (congestive) and diastolic (congestive) heart failure: Secondary | ICD-10-CM | POA: Diagnosis not present

## 2023-10-21 DIAGNOSIS — E876 Hypokalemia: Secondary | ICD-10-CM | POA: Diagnosis not present

## 2023-10-21 DIAGNOSIS — Z7984 Long term (current) use of oral hypoglycemic drugs: Secondary | ICD-10-CM

## 2023-10-21 DIAGNOSIS — I5033 Acute on chronic diastolic (congestive) heart failure: Secondary | ICD-10-CM | POA: Diagnosis not present

## 2023-10-21 DIAGNOSIS — I440 Atrioventricular block, first degree: Secondary | ICD-10-CM | POA: Diagnosis not present

## 2023-10-21 DIAGNOSIS — Z8611 Personal history of tuberculosis: Secondary | ICD-10-CM

## 2023-10-21 DIAGNOSIS — R0600 Dyspnea, unspecified: Principal | ICD-10-CM

## 2023-10-21 DIAGNOSIS — I130 Hypertensive heart and chronic kidney disease with heart failure and stage 1 through stage 4 chronic kidney disease, or unspecified chronic kidney disease: Secondary | ICD-10-CM | POA: Diagnosis present

## 2023-10-21 DIAGNOSIS — Z91040 Latex allergy status: Secondary | ICD-10-CM

## 2023-10-21 DIAGNOSIS — I509 Heart failure, unspecified: Secondary | ICD-10-CM

## 2023-10-21 LAB — CBC WITH DIFFERENTIAL/PLATELET
Abs Immature Granulocytes: 0.02 K/uL (ref 0.00–0.07)
Basophils Absolute: 0.1 K/uL (ref 0.0–0.1)
Basophils Relative: 1 %
Eosinophils Absolute: 0.2 K/uL (ref 0.0–0.5)
Eosinophils Relative: 3 %
HCT: 35.7 % — ABNORMAL LOW (ref 39.0–52.0)
Hemoglobin: 11.8 g/dL — ABNORMAL LOW (ref 13.0–17.0)
Immature Granulocytes: 0 %
Lymphocytes Relative: 6 %
Lymphs Abs: 0.5 K/uL — ABNORMAL LOW (ref 0.7–4.0)
MCH: 31.1 pg (ref 26.0–34.0)
MCHC: 33.1 g/dL (ref 30.0–36.0)
MCV: 94.2 fL (ref 80.0–100.0)
Monocytes Absolute: 0.8 K/uL (ref 0.1–1.0)
Monocytes Relative: 9 %
Neutro Abs: 6.9 K/uL (ref 1.7–7.7)
Neutrophils Relative %: 81 %
Platelets: 191 K/uL (ref 150–400)
RBC: 3.79 MIL/uL — ABNORMAL LOW (ref 4.22–5.81)
RDW: 13.3 % (ref 11.5–15.5)
WBC: 8.5 K/uL (ref 4.0–10.5)
nRBC: 0 % (ref 0.0–0.2)

## 2023-10-21 LAB — TROPONIN I (HIGH SENSITIVITY)
Troponin I (High Sensitivity): 14 ng/L (ref <18)
Troponin I (High Sensitivity): 18 ng/L — ABNORMAL HIGH (ref <18)

## 2023-10-21 LAB — COMPREHENSIVE METABOLIC PANEL WITH GFR
ALT: 20 U/L (ref 0–44)
AST: 24 U/L (ref 15–41)
Albumin: 3.8 g/dL (ref 3.5–5.0)
Alkaline Phosphatase: 46 U/L (ref 38–126)
Anion gap: 10 (ref 5–15)
BUN: 13 mg/dL (ref 8–23)
CO2: 24 mmol/L (ref 22–32)
Calcium: 8.8 mg/dL — ABNORMAL LOW (ref 8.9–10.3)
Chloride: 98 mmol/L (ref 98–111)
Creatinine, Ser: 0.84 mg/dL (ref 0.61–1.24)
GFR, Estimated: >60 mL/min (ref >60)
Glucose, Bld: 113 mg/dL — ABNORMAL HIGH (ref 70–99)
Potassium: 4 mmol/L (ref 3.5–5.1)
Sodium: 132 mmol/L — ABNORMAL LOW (ref 135–145)
Total Bilirubin: 0.7 mg/dL (ref 0.0–1.2)
Total Protein: 6.2 g/dL — ABNORMAL LOW (ref 6.5–8.1)

## 2023-10-21 LAB — RESP PANEL BY RT-PCR (RSV, FLU A&B, COVID)  RVPGX2
Influenza A by PCR: NEGATIVE
Influenza B by PCR: NEGATIVE
Resp Syncytial Virus by PCR: NEGATIVE
SARS Coronavirus 2 by RT PCR: NEGATIVE

## 2023-10-21 LAB — BRAIN NATRIURETIC PEPTIDE: B Natriuretic Peptide: 681.3 pg/mL — ABNORMAL HIGH (ref 0.0–100.0)

## 2023-10-21 LAB — CBG MONITORING, ED: Glucose-Capillary: 124 mg/dL — ABNORMAL HIGH (ref 70–99)

## 2023-10-21 LAB — GLUCOSE, CAPILLARY: Glucose-Capillary: 153 mg/dL — ABNORMAL HIGH (ref 70–99)

## 2023-10-21 MED ORDER — IRBESARTAN 75 MG PO TABS
75.0000 mg | ORAL_TABLET | Freq: Every day | ORAL | Status: DC
  Administered 2023-10-22: 75 mg via ORAL
  Filled 2023-10-21 (×2): qty 1

## 2023-10-21 MED ORDER — CARVEDILOL 12.5 MG PO TABS: 25.0000 mg | ORAL_TABLET | Freq: Two times a day (BID) | ORAL | Status: DC

## 2023-10-21 MED ORDER — IRBESARTAN 150 MG PO TABS: 75.0000 mg | ORAL_TABLET | Freq: Every day | ORAL | Status: DC

## 2023-10-21 MED ORDER — ASPIRIN 81 MG PO TBEC
81.0000 mg | DELAYED_RELEASE_TABLET | Freq: Every day | ORAL | Status: DC
  Administered 2023-10-22 – 2023-10-24 (×3): 81 mg via ORAL
  Filled 2023-10-21 (×4): qty 1

## 2023-10-21 MED ORDER — HYDROCODONE-ACETAMINOPHEN 7.5-325 MG/15ML PO SOLN: 10.0000 mL | Freq: Three times a day (TID) | ORAL | Status: DC | PRN

## 2023-10-21 MED ORDER — SODIUM CHLORIDE 0.9 % IV SOLN: 250.0000 mL | INTRAVENOUS | Status: AC | PRN

## 2023-10-21 MED ORDER — FUROSEMIDE 10 MG/ML IJ SOLN: 40.0000 mg | Freq: Two times a day (BID) | INTRAMUSCULAR | Status: DC

## 2023-10-21 MED ORDER — ONDANSETRON HCL 4 MG PO TABS
4.0000 mg | ORAL_TABLET | Freq: Four times a day (QID) | ORAL | Status: DC | PRN
  Administered 2023-10-22 (×2): 4 mg via ORAL
  Filled 2023-10-21 (×3): qty 1

## 2023-10-21 MED ORDER — ACETAMINOPHEN 650 MG RE SUPP: 650.0000 mg | Freq: Four times a day (QID) | RECTAL | Status: DC | PRN

## 2023-10-21 MED ORDER — ATORVASTATIN CALCIUM 80 MG PO TABS
80.0000 mg | ORAL_TABLET | Freq: Every day | ORAL | Status: DC
  Administered 2023-10-22 – 2023-10-24 (×3): 80 mg via ORAL
  Filled 2023-10-21 (×3): qty 1

## 2023-10-21 MED ORDER — POLYETHYLENE GLYCOL 3350 17 G PO PACK
17.0000 g | PACK | Freq: Every day | ORAL | Status: DC
Start: 2023-10-21 — End: 2023-10-24
  Administered 2023-10-21 – 2023-10-22 (×2): 17 g via ORAL
  Filled 2023-10-21 (×5): qty 1

## 2023-10-21 MED ORDER — INSULIN ASPART 100 UNIT/ML IJ SOLN: 0.0000 [IU] | Freq: Three times a day (TID) | INTRAMUSCULAR | Status: DC

## 2023-10-21 MED ORDER — IOHEXOL 350 MG/ML SOLN
75.0000 mL | Freq: Once | INTRAVENOUS | Status: AC | PRN
Start: 2023-10-21 — End: 2023-10-21
  Administered 2023-10-21: 75 mL via INTRAVENOUS

## 2023-10-21 MED ORDER — FUROSEMIDE 10 MG/ML IJ SOLN
40.0000 mg | Freq: Two times a day (BID) | INTRAMUSCULAR | Status: DC
  Administered 2023-10-22: 40 mg via INTRAVENOUS
  Filled 2023-10-21 (×2): qty 4

## 2023-10-21 MED ORDER — SENNOSIDES-DOCUSATE SODIUM 8.6-50 MG PO TABS
1.0000 | ORAL_TABLET | Freq: Every evening | ORAL | Status: DC | PRN
  Administered 2023-10-23: 1 via ORAL
  Filled 2023-10-21: qty 1

## 2023-10-21 MED ORDER — SODIUM CHLORIDE 0.9% FLUSH
3.0000 mL | Freq: Two times a day (BID) | INTRAVENOUS | Status: DC
  Administered 2023-10-21 – 2023-10-24 (×6): 3 mL via INTRAVENOUS

## 2023-10-21 MED ORDER — HYDRALAZINE HCL 20 MG/ML IJ SOLN
10.0000 mg | Freq: Four times a day (QID) | INTRAMUSCULAR | Status: DC | PRN
  Administered 2023-10-22: 10 mg via INTRAVENOUS
  Filled 2023-10-21: qty 1

## 2023-10-21 MED ORDER — INSULIN ASPART 100 UNIT/ML IJ SOLN: 0.0000 [IU] | Freq: Every day | INTRAMUSCULAR | Status: DC

## 2023-10-21 MED ORDER — HYDROCODONE-ACETAMINOPHEN 7.5-325 MG/15ML PO SOLN
10.0000 mL | Freq: Three times a day (TID) | ORAL | Status: DC | PRN
  Administered 2023-10-21 – 2023-10-22 (×2): 10 mL via ORAL
  Filled 2023-10-21 (×3): qty 15

## 2023-10-21 MED ORDER — LORATADINE 10 MG PO TABS: 10.0000 mg | ORAL_TABLET | Freq: Every day | ORAL | Status: DC

## 2023-10-21 MED ORDER — LORATADINE 10 MG PO TABS
10.0000 mg | ORAL_TABLET | Freq: Every evening | ORAL | Status: DC
Start: 2023-10-22 — End: 2023-10-22

## 2023-10-21 MED ORDER — ACETAMINOPHEN 325 MG PO TABS
650.0000 mg | ORAL_TABLET | Freq: Four times a day (QID) | ORAL | Status: DC | PRN
  Administered 2023-10-22: 650 mg via ORAL
  Filled 2023-10-21: qty 2

## 2023-10-21 MED ORDER — ONDANSETRON HCL 4 MG/2ML IJ SOLN: 4.0000 mg | Freq: Four times a day (QID) | INTRAMUSCULAR | Status: DC | PRN

## 2023-10-21 MED ORDER — AMLODIPINE BESYLATE 5 MG PO TABS: 10.0000 mg | ORAL_TABLET | Freq: Every day | ORAL | Status: DC

## 2023-10-21 MED ORDER — FUROSEMIDE 10 MG/ML IJ SOLN
40.0000 mg | Freq: Once | INTRAMUSCULAR | Status: AC
  Administered 2023-10-21: 40 mg via INTRAVENOUS
  Filled 2023-10-21: qty 4

## 2023-10-21 MED ORDER — SODIUM CHLORIDE 0.9% FLUSH: 3.0000 mL | INTRAVENOUS | Status: DC | PRN

## 2023-10-21 MED ORDER — CARVEDILOL 25 MG PO TABS
25.0000 mg | ORAL_TABLET | Freq: Two times a day (BID) | ORAL | Status: DC
  Administered 2023-10-21 – 2023-10-24 (×6): 25 mg via ORAL
  Filled 2023-10-21: qty 1
  Filled 2023-10-21: qty 2
  Filled 2023-10-21 (×4): qty 1

## 2023-10-21 MED ORDER — ENOXAPARIN SODIUM 40 MG/0.4ML IJ SOSY
40.0000 mg | PREFILLED_SYRINGE | INTRAMUSCULAR | Status: DC
  Administered 2023-10-21 – 2023-10-23 (×3): 40 mg via SUBCUTANEOUS
  Filled 2023-10-21 (×3): qty 0.4

## 2023-10-21 NOTE — ED Notes (Signed)
 Extra urine sent to main lab

## 2023-10-21 NOTE — H&P (Signed)
 History and Physical    NOBORU BIDINGER FMW:[id number] DOB: 09/13/1955 DOA: 10/21/2023  PCP: Lynwood Laneta ORN, PA-C   Patient coming from: Home   Chief Complaint:  Chief Complaint  Patient presents with   Shortness of Breath   ED TRIAGE note:  Sent from UC for exertional SHOB (sats drop to 82% with ambulation). H/x cancer. Pt states that he is also fluid overloaded. Speaking in complete sentences.             HPI:  Randall [name] is a 68 y.o. 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  Patient initially went to urgent care and sent over to the emergency department as found to have desatting to 80% on room air with exertion.  At rest patient feels improvement of shortness of breath however with exertion he developed dyspnea.  Also reporting chest pressure with exertion.  He is compliant with his medication Lasix  at home.  Patient follows otolaryngologist with [facility].   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  Otherwise hemodynamically stable.  O2 sat 94 to 99% room air.  Lab work, troponin 14 and 18. CBC unremarkable.  Stable H&H. CMP showing low sodium 132 which is at baseline otherwise unremarkable. Respiratory panel negative for COVID RSV flu. Elevated BNP 681.   CTA chest no evidence of pulmonary embolism.  It showed moderate to large bilateral pleural effusion and interstitial edema.  CAD.  Aortic atherosclerosis. Chest x-ray moderate severe interstitial edema with mild  basilar atelectasis.  In the ED patient received IV Lasix .  Hospitalist has been consulted for further evaluation management of acute hypoxic respiratory failure in the setting of CHF exacerbation and hypertensive urgency.  Significant labs in the ED: Lab Orders         Resp panel by RT-PCR (RSV, Flu A&B, Covid) Anterior Nasal Swab         Brain natriuretic peptide         CBC with Differential         Comprehensive metabolic panel         CBC         Comprehensive metabolic panel       Review of Systems:  Review of Systems  Constitutional:  Positive for malaise/fatigue. Negative for chills, fever and weight loss.  Respiratory:  Positive for shortness of breath. Negative for cough, sputum production and wheezing.   Cardiovascular:  Positive for orthopnea, leg swelling and PND. Negative for chest pain.  Gastrointestinal:  Negative for heartburn, nausea and vomiting.  Musculoskeletal:  Negative for myalgias.  Neurological:  Negative for dizziness and headaches.  Psychiatric/Behavioral:  The patient is not nervous/anxious.     Past Medical History:  Diagnosis Date   Arthritis    CHF (congestive heart failure) (HCC)    CKD (chronic kidney disease) stage 2, GFR 60-89 ml/min    Complication of anesthesia    Diabetes mellitus without complication (HCC)    Elevated coronary artery calcium  score 02/02/2021   GERD (gastroesophageal reflux disease)    History of kidney stones 2014  Hypertension    Kidney stone 06/17/2015   Laryngeal mass    squamous carcinoid cancer   Lymphedema    PONV (postoperative nausea and vomiting)    Rupture of right quadriceps tendon 09/21/2020   - s/p srugical repair   Squamous cell carcinoma of neck    Thoracic aortic atherosclerosis    Tuberculosis 1980   Type 2 diabetes mellitus without complication, without long-term current use of insulin  (HCC) 03/21/2020    Past Surgical History:  Procedure Laterality Date   INGUINAL HERNIA REPAIR Bilateral  06/15/2023   Procedure: REPAIR, HERNIA, INGUINAL, LAPAROSCOPIC;  Surgeon: Rubin Calamity, MD;  Location: [facility] OR;  Service: General;  Laterality: Bilateral;  LAPAROSCOPIC BILATERAL INGUINAL HERNIA REPAIR WITH MESH   KIDNEY SURGERY     KNEE SURGERY     LEFT HEART CATH AND CORONARY ANGIOGRAPHY N/A 06/12/2023   Procedure: LEFT HEART CATH AND CORONARY ANGIOGRAPHY;  Surgeon: Anner Alm ORN, MD;  Location: [facility] INVASIVE CV LAB;  Service: Cardiovascular;  Laterality: N/A;   LITHOTRIPSY     MICROLARYNGOSCOPY Bilateral 06/17/2022   Procedure: DIRECT LARYNGOSCOPY WITH BIOPSY;  Surgeon: Llewellyn Gerard LABOR, DO;  Location: [facility] OR;  Service: ENT;  Laterality: Bilateral;   QUADRICEPS TENDON REPAIR Right 09/25/2020   Procedure: REPAIR RIGHT QUADRICEP TENDON;  Surgeon: Harden Jerona GAILS, MD;  Location: [facility] OR;  Service: Orthopedics;  Laterality: Right;   TONSILLECTOMY     as a child     reports that he has never smoked. He has never used smokeless tobacco. He reports that he does not drink alcohol and does not use drugs.  Allergies  Allergen Reactions   Norgesic Forte [Orphenadrine-Aspirin -Caffeine] Other (See Comments)    Unknown reaction    Latex Hives and Rash    Blisters    History reviewed. No pertinent family history.  Prior to Admission medications   Medication Sig Start Date End Date Taking? Authorizing Provider  amLODipine  (NORVASC ) 10 MG tablet Take 1 tablet (10 mg total) by mouth daily. 06/13/23   Wouk, Devaughn Sayres, MD  aspirin  EC 81 MG tablet Take 81 mg by mouth daily. Swallow whole.    [provider]  atorvastatin  (LIPITOR) 80 MG tablet Take 1 tablet (80 mg total) by mouth daily. 06/13/23   Wouk, Devaughn Sayres, MD  carvedilol  (COREG ) 25 MG tablet Take 25 mg by mouth 2 (two) times daily with a meal.    [provider]  chlorhexidine  (PERIDEX ) 0.12 % solution Use as directed 10 mLs in the mouth or throat 2 (two) times daily.    [provider]  Continuous Glucose Sensor  (FREESTYLE LIBRE 3 PLUS SENSOR) MISC REPLACE SENSOR EVERY 2 WEEKS - DIAGNOSIS TYPE 2 DM    [provider]  dicyclomine  (BENTYL ) 20 MG tablet Take 1 tablet (20 mg total) by mouth 2 (two) times daily. 07/28/23   Barrett, Warren SAILOR, PA-C  furosemide  (LASIX ) 20 MG tablet Take 1 tablet (20 mg total) by mouth daily as needed for fluid or edema. 09/26/23   Rana Lum CROME, NP  HYDROcodone -acetaminophen  (HYCET) 7.5-325 mg/15 ml solution Take 10 mL up to TID as needed for pain - not to exceed 30mL daily. 02/27/23   Izell Domino, MD  levocetirizine SHELDON ALLERGY 24HR) 5 MG tablet Take 1 tablet (5 mg total) by mouth every evening. 10/09/23   Soldatova, Liuba, MD  lidocaine  (XYLOCAINE ) 2 % solution  08/28/23   [provider]  loperamide  (IMODIUM  A-D) 2 MG tablet Take 2 mg  by mouth 3 (three) times daily as needed for diarrhea or loose stools.    [provider]  meloxicam  (MOBIC ) 15 MG tablet Take 15 mg by mouth daily. 09/07/20   [provider]  Multiple Vitamin (MULTIVITAMIN PO) Take 1 tablet by mouth daily.    [provider]  ondansetron  (ZOFRAN ) 8 MG tablet Take 8 mg by mouth every 8 (eight) hours as needed for nausea or vomiting.    [provider]  OVER THE COUNTER MEDICATION Take 30 mLs by mouth every 6 (six) hours as needed (Pain). PainQuil    [provider]  oxymetazoline  (AFRIN) 0.05 % nasal spray Place 1 spray into both nostrils 2 (two) times daily as needed for congestion.    [provider]  pantoprazole  (PROTONIX ) 40 MG tablet Take 40 mg by mouth daily.    [provider]  polyethylene glycol (MIRALAX / GLYCOLAX) 17 g packet Take 17 g by mouth daily as needed for moderate constipation.    [provider]  potassium chloride  (MICRO-K ) 10 MEQ CR capsule Take 10 mEq by mouth 2 (two) times daily. 09/25/23   [provider]  potassium chloride  SA (KLOR-CON  M) 20 MEQ tablet TAKE 20 MEQ AS NEEDED WHEN TAKING  THE LASIX . 09/26/23   Rana Dixon L, NP  PREVIDENT 5000 ENAMEL PROTECT 1.1-5 % GEL Place 1 application  onto teeth at bedtime.    [provider]  tadalafil (CIALIS) 20 MG tablet Take 20 mg by mouth daily as needed for erectile dysfunction. 07/27/20   [provider]  telmisartan (MICARDIS) 80 MG tablet Take 80 mg by mouth daily.    [provider]  TRULICITY 0.75 MG/0.5ML SOAJ Inject 0.75 mg into the skin once a week.    [provider]     Physical Exam: Vitals:   10/21/23 1822 10/21/23 1830 10/21/23 1845 10/21/23 1900  BP:  (!) 182/89 (!) 187/88 (!) 189/94  Pulse:  78 70 77  Resp:      Temp: 98.1 F (36.7 C)     TempSrc: Axillary     SpO2:  97% 94% 94%  Weight:      Height:        Physical Exam Vitals and nursing note reviewed.  Constitutional:      Appearance: He is well-developed.  Cardiovascular:     Rate and Rhythm: Normal rate and regular rhythm.  Pulmonary:     Effort: Pulmonary effort is normal.     Breath sounds: Normal breath sounds. No decreased breath sounds, wheezing, rhonchi or rales.  Musculoskeletal:     Cervical back: Normal range of motion and neck supple.  Skin:    Capillary Refill: Capillary refill takes less than 2 seconds.  Neurological:     Mental Status: He is alert and oriented to person, place, and time.      Labs on Admission: I have personally reviewed following labs and imaging studies  CBC: Recent Labs  Lab 10/21/23 1602  WBC 8.5  NEUTROABS 6.9  HGB 11.8*  HCT 35.7*  MCV 94.2  PLT 191   Basic Metabolic Panel: Recent Labs  Lab 10/21/23 1602  NA 132*  K 4.0  CL 98  CO2 24  GLUCOSE 113*  BUN 13  CREATININE 0.84  CALCIUM  8.8*   GFR: Estimated Creatinine Clearance: 77.7 mL/min (by C-G formula based on SCr of 0.84 mg/dL). Liver Function Tests: Recent Labs  Lab 10/21/23 1602  AST 24  ALT 20  ALKPHOS 46  BILITOT 0.7  PROT 6.2*  ALBUMIN 3.8   No results for input(s): LIPASE,  AMYLASE in the last 168 hours. No results for input(s): AMMONIA in the last 168 hours. Coagulation Profile: No results for input(s): INR, PROTIME in the last 168 hours. Cardiac Enzymes: Recent Labs  Lab 10/21/23 1553 10/21/23 1753  TROPONINIHS 14 18*   BNP (last 3 results) Recent Labs    10/21/23 1554  BNP 681.3*   HbA1C: No results for input(s): HGBA1C in the last 72 hours. CBG: No results for input(s): GLUCAP in the last 168 hours. Lipid Profile: No results for input(s): CHOL, HDL, LDLCALC, TRIG, CHOLHDL, LDLDIRECT in the last 72 hours. Thyroid  Function Tests: No results for input(s): TSH, T4TOTAL, FREET4, T3FREE, THYROIDAB in the last 72 hours. Anemia Panel: No results for input(s): VITAMINB12, FOLATE, FERRITIN, TIBC, IRON, RETICCTPCT in the last 72 hours. Urine analysis:    Component Value Date/Time   COLORURINE COLORLESS (A) 08/14/2023 1328   APPEARANCEUR CLEAR 08/14/2023 1328   LABSPEC 1.005 08/14/2023 1328   PHURINE 7.5 08/14/2023 1328   GLUCOSEU NEGATIVE 08/14/2023 1328   HGBUR NEGATIVE 08/14/2023 1328   BILIRUBINUR NEGATIVE 08/14/2023 1328   KETONESUR NEGATIVE 08/14/2023 1328   PROTEINUR NEGATIVE 08/14/2023 1328   UROBILINOGEN 1.0 03/02/2010 2317   NITRITE NEGATIVE 08/14/2023 1328   LEUKOCYTESUR NEGATIVE 08/14/2023 1328    Radiological Exams on Admission: I have personally reviewed images CT Angio Chest PE W and/or Wo Contrast Result Date: 10/21/2023 CLINICAL DATA:  Pulmonary embolism (PE) suspected, low to intermediate prob, neg D-dimer. Shortness of breath EXAM: CT ANGIOGRAPHY CHEST WITH CONTRAST TECHNIQUE: Multidetector CT imaging of the chest was performed using the standard protocol during bolus administration of intravenous contrast. Multiplanar CT image reconstructions and MIPs were obtained to evaluate the vascular anatomy. RADIATION DOSE REDUCTION: This exam was performed according to the departmental  dose-optimization program which includes automated exposure control, adjustment of the mA and/or kV according to patient size and/or use of iterative reconstruction technique. CONTRAST:  75mL OMNIPAQUE  IOHEXOL  350 MG/ML SOLN COMPARISON:  02/09/2023 FINDINGS: Cardiovascular: Heart is normal size. Aorta is normal caliber. Moderate coronary artery and aortic atherosclerosis. No filling defects in the pulmonary arteries to suggest pulmonary emboli. Mediastinum/Nodes: No mediastinal, hilar, or axillary adenopathy. Trachea and esophagus are unremarkable. Thyroid  unremarkable. Lungs/Pleura: Moderate to large bilateral pleural effusions. Mild vascular congestion. Interstitial prominence and ground-glass perihilar opacities suggest edema. Upper Abdomen: No acute findings Musculoskeletal: Chest wall soft tissues are unremarkable. No acute bony abnormality. Review of the MIP images confirms the above findings. IMPRESSION: No evidence of pulmonary embolus. Moderate to large bilateral pleural effusions. Suspect mild interstitial edema. Coronary artery disease. Aortic Atherosclerosis (ICD10-I70.0). Electronically Signed   By: Franky Crease M.D.   On: 10/21/2023 19:48   DG Chest 2 View Result Date: 10/21/2023 CLINICAL DATA:  Shortness of breath. EXAM: CHEST - 2 VIEW COMPARISON:  August 31, 2023 FINDINGS: The heart size and mediastinal contours are within normal limits. Moderate severity diffusely increased interstitial lung markings are seen. Mild atelectatic changes are noted within the bilateral lung bases. No pleural effusion or pneumothorax is identified. The visualized skeletal structures are unremarkable. IMPRESSION: Moderate severity interstitial edema with mild bibasilar atelectasis. Electronically Signed   By: Suzen Dials M.D.   On: 10/21/2023 16:45     EKG: My personal interpretation of EKG shows: Normal sinus rhythm heart rate 77 with first-degree AV block    Assessment/Plan: Principal Problem:    Acute CHF (  congestive heart failure) (HCC) Active Problems:   Acute exacerbation of CHF (congestive heart failure) (HCC)   Acute hypoxic respiratory failure (HCC)   Non-insulin  dependent type 2 diabetes mellitus (HCC)   History of CAD (coronary artery disease)   Chronic acquired lymphedema   History of laryngeal cancer   Elevated troponin   First degree AV block    Assessment and Plan: Acute hypoxic respiratory failure-secondary to CHF exacerbation Acute CHF exacerbation Hypertensive urgency Bilateral pleural effusion-secondary to acute CHF Presented emergency department with complaining about progressively worsening orthopnea dyspnea and PND.  Patient has chronic lymphedema of the bilateral lower extremities.  Went to urgent care her O2 sat dropped to 80% room air with exertion however while patient in the ED maintaining O2 sat 94 to 100% at rest. - Found to have elevated blood pressure 191/92 at presentation.  Otherwise hemodynamically stable. -Lab work, troponin 14 and 18. CBC unremarkable.  Stable H&H. CMP showing low sodium 132 which is at baseline otherwise unremarkable. Respiratory panel negative for COVID RSV flu. Elevated BNP 681. -CTA chest no evidence of pulmonary embolism.  It showed moderate to large bilateral pleural effusion and interstitial edema.  CAD.  Aortic atherosclerosis. -Chest x-ray moderate severe interstitial edema with mild basilar atelectasis. -In the ED patient received IV Lasix  40 mg - Plan to continue IV Lasix  twice daily. -Patient reported he does not take amlodipine  anymore.  Currently he is on Coreg  25 twice daily, hydrochlorothiazide 25 mg daily Lasix  25 mg daily and Micardis 75 mg daily - Resuming home blood pressure Coreg  25 mg twice daily and Avapro  75 mg daily. -Goal to gradually improve blood pressure over the course of next 24 hours. -Strict I's/O, daily weight and monitor urine output - Repeat chest x-ray tomorrow morning to follow-up with  bilateral pleural effusion while getting treating with IV Lasix . -Obtaining echocardiogram - Continue cardiac monitoring. -Pending verification of home medications by pharmacy.   Non-insulin -dependent DM type II -Continue sliding scale insulin  with mealtime coverage.  History of CAD -Continue aspirin , Coreg  and Lipitor  Elevated troponin-secondary demand ischemia -Initial troponin 14 trended up to 18.  Without any delta change.  Elevated troponin secondary to demand ischemia in the context of acute CHF exacerbation.  At this time there is no concern for ACS. - Obtaining echocardiogram for CHF.  History of first-degree AV block -History of first-degree AV block.  Continue Coreg  25 mg twice daily.  Continue cardiac monitoring for development of any progression of first-degree AV block.  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 Patient's follows otolaryngology outpatient last clinic visit on 9/29 2025  Chronic hyponatremia -Sodium 132 which is at baseline.  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 - Continue home high-tech 3 times daily as needed.  Add MiraLAX as needed.  DVT prophylaxis:  Lovenox  Code Status:  Full Code Diet: Fluid restriction 2 L/day, salt restriction 2 g/day.  Heart healthy carb modified diet Family Communication:   Family was present at bedside, at the time of interview. Opportunity was given to ask question and all questions were answered satisfactorily.  Disposition Plan: Continue to monitor improvement of volume status and pleural effusion Consults: None indicated at this time Admission status:   Inpatient, Telemetry bed  Severity of Illness: The appropriate patient status for this  patient is INPATIENT. Inpatient status is  judged to be reasonable and necessary in order to provide the required intensity of service to ensure the patient's safety. The patient's presenting symptoms, physical exam findings, and initial radiographic and laboratory data in the context of their chronic comorbidities is felt to place them at high risk for further clinical deterioration. Furthermore, it is not anticipated that the patient will be medically stable for discharge from the hospital within 2 midnights of admission.   * I certify that at the point of admission it is my clinical judgment that the patient will require inpatient hospital care spanning beyond 2 midnights from the point of admission due to high intensity of service, high risk for further deterioration and high frequency of surveillance required.DEWAINE    Ediberto Sens, MD [facility]  How to contact the [facility] Attending or Consulting provider 7A - 7P or covering provider during after hours 7P -7A, for this patient.  Check the care team in [facility] and look for a) attending/consulting [facility] provider listed and b) the [facility] team listed Log into [email or web address] and use [facility] universal password to access. If you do not have the password, please contact the hospital operator. Locate the [facility] provider you are looking for under [facility] and page to a number that you can be directly reached. If you still have difficulty reaching the provider, please page the [facility] (Director on Call) for the Hospitalists listed on amion for assistance.  10/21/2023, 9:02 PM

## 2023-10-21 NOTE — ED Provider Notes (Addendum)
 [place] [facility]GENC[facility] DEPARTMENT AT [facility] Provider Note   CSN: [id number] Arrival date & time: 10/21/23  1521     Patient presents with: Shortness of Breath   Randall [name] is a 68 y.o. male.   68 year old male with prior medical history as detailed below presents for evaluation.  Patient with prior medical history including CAD, CHF, hypertension, lymphedema, type II DM, squamous cell carcinoma of the larynx.  He presents with complaint of increasing shortness of breath and fatigue x 2 to 3 days.  Patient was seen at urgent care.  Patient was sent for evaluation given that he desatted into the low 80s on room air with exertion.  At rest the patient feels improved.  Patient reports that with exertion he feels very winded and short of breath.  He reports feeling some chest pressure concurrent with any exertion.  He is compliant with previously prescribed medications including his Lasix .  The history is provided by the patient and medical records.       Prior to Admission medications   Medication Sig Start Date End Date Taking? Authorizing Provider  amLODipine  (NORVASC ) 10 MG tablet Take 1 tablet (10 mg total) by mouth daily. 06/13/23   Wouk, Devaughn Sayres, MD  aspirin  EC 81 MG tablet Take 81 mg by mouth daily. Swallow whole.    [provider]  atorvastatin  (LIPITOR) 80 MG tablet Take 1 tablet (80 mg total) by mouth daily. 06/13/23   Wouk, Devaughn Sayres, MD  carvedilol  (COREG ) 25 MG tablet Take 25 mg by mouth 2 (two) times daily with a meal.    [provider]  chlorhexidine  (PERIDEX ) 0.12 % solution Use as directed 10 mLs in the mouth or throat 2 (two) times daily.    [provider]  Continuous Glucose Sensor (FREESTYLE LIBRE 3 PLUS SENSOR) MISC REPLACE SENSOR EVERY 2 WEEKS - DIAGNOSIS TYPE 2 DM    [provider]  dicyclomine  (BENTYL ) 20 MG tablet Take 1 tablet (20 mg total) by mouth 2 (two) times daily. 07/28/23   Barrett, Jamie N, PA-C   furosemide  (LASIX ) 20 MG tablet Take 1 tablet (20 mg total) by mouth daily as needed for fluid or edema. 09/26/23   Rana Lum CROME, NP  HYDROcodone -acetaminophen  (HYCET) 7.5-325 mg/15 ml solution Take 10 mL up to TID as needed for pain - not to exceed 30mL daily. 02/27/23   Izell Domino, MD  levocetirizine SHELDON ALLERGY 24HR) 5 MG tablet Take 1 tablet (5 mg total) by mouth every evening. 10/09/23   Soldatova, Liuba, MD  lidocaine  (XYLOCAINE ) 2 % solution  08/28/23   [provider]  loperamide  (IMODIUM  A-D) 2 MG tablet Take 2 mg by mouth 3 (three) times daily as needed for diarrhea or loose stools.    [provider]  meloxicam  (MOBIC ) 15 MG tablet Take 15 mg by mouth daily. 09/07/20   [provider]  Multiple Vitamin (MULTIVITAMIN PO) Take 1 tablet by mouth daily.    [provider]  ondansetron  (ZOFRAN ) 8 MG tablet Take 8 mg by mouth every 8 (eight) hours as needed for nausea or vomiting.    [provider]  OVER THE COUNTER MEDICATION Take 30 mLs by mouth every 6 (six) hours as needed (Pain). PainQuil    [provider]  oxymetazoline  (AFRIN) 0.05 % nasal spray Place 1 spray into both nostrils 2 (two) times daily as needed for congestion.    [provider]  pantoprazole  (PROTONIX ) 40 MG  tablet Take 40 mg by mouth daily.    [provider]  polyethylene glycol (MIRALAX / GLYCOLAX) 17 g packet Take 17 g by mouth daily as needed for moderate constipation.    [provider]  potassium chloride  (MICRO-K ) 10 MEQ CR capsule Take 10 mEq by mouth 2 (two) times daily. 09/25/23   [provider]  potassium chloride  SA (KLOR-CON  M) 20 MEQ tablet TAKE 20 MEQ AS NEEDED WHEN TAKING THE LASIX . 09/26/23   Fountain, Madison L, NP  PREVIDENT 5000 ENAMEL PROTECT 1.1-5 % GEL Place 1 application  onto teeth at bedtime.    [provider]  tadalafil (CIALIS) 20 MG tablet Take 20 mg by mouth daily as needed for erectile  dysfunction. 07/27/20   [provider]  telmisartan (MICARDIS) 80 MG tablet Take 80 mg by mouth daily.    [provider]  TRULICITY 0.75 MG/0.5ML SOAJ Inject 0.75 mg into the skin once a week.    [provider]    Allergies: Norgesic forte [orphenadrine-aspirin -caffeine] and Latex    Review of Systems  All other systems reviewed and are negative.   Updated Vital Signs BP (!) 191/92 (BP Location: Left Arm)   Pulse 77   Temp 97.8 F (36.6 C)   Resp 18   Ht 5' 10 (1.778 m)   Wt 64.4 kg   SpO2 99%   BMI 20.37 kg/m   Physical Exam Vitals and nursing note reviewed.  Constitutional:      General: He is not in acute distress.    Appearance: Normal appearance. He is well-developed.  HENT:     Head: Normocephalic and atraumatic.  Eyes:     Conjunctiva/sclera: Conjunctivae normal.     Pupils: Pupils are equal, round, and reactive to light.  Cardiovascular:     Rate and Rhythm: Normal rate and regular rhythm.     Heart sounds: Normal heart sounds.  Pulmonary:     Effort: Pulmonary effort is normal. No respiratory distress.     Comments: Decreased breath sounds at bilateral bases Abdominal:     General: There is no distension.     Palpations: Abdomen is soft.     Tenderness: There is no abdominal tenderness.  Musculoskeletal:        General: No deformity. Normal range of motion.     Cervical back: Normal range of motion and neck supple.  Skin:    General: Skin is warm and dry.  Neurological:     General: No focal deficit present.     Mental Status: He is alert and oriented to person, place, and time.     (all labs ordered are listed, but only abnormal results are displayed) Labs Reviewed  CBC WITH DIFFERENTIAL/PLATELET - Abnormal; Notable for the following components:      Result Value   RBC 3.79 (*)    Hemoglobin 11.8 (*)    HCT 35.7 (*)    Lymphs Abs 0.5 (*)    All other components within normal limits  RESP PANEL BY RT-PCR (RSV, FLU  A&B, COVID)  [id number]  BRAIN NATRIURETIC PEPTIDE  COMPREHENSIVE METABOLIC PANEL WITH GFR  TROPONIN I (HIGH SENSITIVITY)    EKG: None  Radiology: No results found.   Procedures   Medications Ordered in the ED - No data to display  Medical Decision Making Patient is presenting with dyspnea especially with exertion.  Patient is noted to be hypoxic with exertion.  Imaging demonstrates bilateral pleural effusions and pulmonary edema.  There is no evidence of PE seen on CT imaging.  Obtained EKG does not suggest acute ischemia.  Troponin is 14 and then 18.  BNP is 681.  Other screening labs are also without significant acute abnormality.  Patient would benefit from admission for diuresis.  Medicine service made aware of case and will evaluate for same.  Amount and/or Complexity of Data Reviewed Labs: ordered. Radiology: ordered.  Risk Prescription drug management.   CRITICAL CARE Performed by: Maude JAYSON Galloway   Total critical care time: 30 minutes  Critical care time was exclusive of separately billable procedures and treating other patients.  Critical care was necessary to treat or prevent imminent or life-threatening deterioration.  Critical care was time spent personally by me on the following activities: development of treatment plan with patient and/or surrogate as well as nursing, discussions with consultants, evaluation of patient's response to treatment, examination of patient, obtaining history from patient or surrogate, ordering and performing treatments and interventions, ordering and review of laboratory studies, ordering and review of radiographic studies, pulse oximetry and re-evaluation of patient's condition.      Final diagnoses:  Dyspnea, unspecified type    ED Discharge Orders     None          Galloway Maude JAYSON, MD 10/21/23 2020    Galloway Maude JAYSON, MD 10/21/23 2034

## 2023-10-21 NOTE — ED Notes (Signed)
 Pt voided  urine saved at bedside.  The pt just had a  [facility]

## 2023-10-21 NOTE — Plan of Care (Addendum)
 Patient is requesting for home Xyzal for sinus congestion.  Ordered

## 2023-10-21 NOTE — ED Triage Notes (Signed)
 Sent from [facility] for exertional SHOB (sats drop to 82% with ambulation). H/x cancer. Pt states that he is also fluid overloaded. Speaking in complete sentences.

## 2023-10-21 NOTE — ED Provider Triage Note (Signed)
 Emergency Medicine Provider Triage Evaluation Note  Randall [name] , a 68 y.o. male  was evaluated in triage.  Pt complains of shortness of breath.  Symptoms began yesterday evening when he was trimming shrubbery.  Denies history of similar symptoms previously.  Does report that he has had a worsening cough for the last few days.  Reports some tightness in his chest but no significant pain.  Does have a history of lymphedema and ENT malignancy s/p radiation and resection now in remission.  Does not feel any abnormal swelling in his neck aside from his known lymphedema.  Also has leg swelling from his lymphedema no more than usual.  No recent travel or surgeries.  Was seen at urgent care and sent here for evaluation given his exertional oxygen dropped down to 82%.  Currently speaking in complete sentences on room air.  Review of Systems  Positive:  Negative:   Physical Exam  BP (!) 191/92 (BP Location: Left Arm)   Pulse 77   Temp 97.8 F (36.6 C)   Resp 18   Ht 5' 10 (1.778 m)   Wt 64.4 kg   SpO2 99%   BMI 20.37 kg/m  Gen:   Awake, no distress   Resp:  Normal effort  MSK:   Moves extremities without difficulty  Other:    Medical Decision Making  Medically screening exam initiated at 4:01 PM.  Appropriate orders placed.  Silver KATHEE Nightingale was informed that the remainder of the evaluation will be completed by another provider, this initial triage assessment does not replace that evaluation, and the importance of remaining in the ED until their evaluation is complete.     Nora Lauraine LABOR, PA-C 10/21/23 [phone]

## 2023-10-22 ENCOUNTER — Inpatient Hospital Stay (HOSPITAL_COMMUNITY)

## 2023-10-22 DIAGNOSIS — I5033 Acute on chronic diastolic (congestive) heart failure: Secondary | ICD-10-CM | POA: Diagnosis not present

## 2023-10-22 DIAGNOSIS — I5031 Acute diastolic (congestive) heart failure: Secondary | ICD-10-CM

## 2023-10-22 DIAGNOSIS — I10 Essential (primary) hypertension: Secondary | ICD-10-CM

## 2023-10-22 DIAGNOSIS — I2510 Atherosclerotic heart disease of native coronary artery without angina pectoris: Secondary | ICD-10-CM

## 2023-10-22 DIAGNOSIS — E785 Hyperlipidemia, unspecified: Secondary | ICD-10-CM | POA: Diagnosis not present

## 2023-10-22 LAB — COMPREHENSIVE METABOLIC PANEL WITH GFR
ALT: 18 U/L (ref 0–44)
AST: 20 U/L (ref 15–41)
Albumin: 3.4 g/dL — ABNORMAL LOW (ref 3.5–5.0)
Alkaline Phosphatase: 42 U/L (ref 38–126)
Anion gap: 10 (ref 5–15)
BUN: 12 mg/dL (ref 8–23)
CO2: 24 mmol/L (ref 22–32)
Calcium: 8.7 mg/dL — ABNORMAL LOW (ref 8.9–10.3)
Chloride: 100 mmol/L (ref 98–111)
Creatinine, Ser: 0.79 mg/dL (ref 0.61–1.24)
GFR, Estimated: >60 mL/min (ref >60)
Glucose, Bld: 111 mg/dL — ABNORMAL HIGH (ref 70–99)
Potassium: 3.4 mmol/L — ABNORMAL LOW (ref 3.5–5.1)
Sodium: 134 mmol/L — ABNORMAL LOW (ref 135–145)
Total Bilirubin: 0.7 mg/dL (ref 0.0–1.2)
Total Protein: 5.7 g/dL — ABNORMAL LOW (ref 6.5–8.1)

## 2023-10-22 LAB — ECHOCARDIOGRAM COMPLETE
AR max vel: 2.99 cm2
AV Area VTI: 3.32 cm2
AV Area mean vel: 3.16 cm2
AV Mean grad: 3.9 mmHg
AV Peak grad: 8 mmHg
Ao pk vel: 1.41 m/s
Area-P 1/2: 3.77 cm2
Height: 70 in
S' Lateral: 3.4 cm
Weight: 2670.21 [oz_av]

## 2023-10-22 LAB — GLUCOSE, CAPILLARY
Glucose-Capillary: 116 mg/dL — ABNORMAL HIGH (ref 70–99)
Glucose-Capillary: 132 mg/dL — ABNORMAL HIGH (ref 70–99)
Glucose-Capillary: 117 mg/dL — ABNORMAL HIGH (ref 70–99)

## 2023-10-22 LAB — CBC
HCT: 33.2 % — ABNORMAL LOW (ref 39.0–52.0)
Hemoglobin: 11.1 g/dL — ABNORMAL LOW (ref 13.0–17.0)
MCH: 31 pg (ref 26.0–34.0)
MCHC: 33.4 g/dL (ref 30.0–36.0)
MCV: 92.7 fL (ref 80.0–100.0)
Platelets: 186 K/uL (ref 150–400)
RBC: 3.58 MIL/uL — ABNORMAL LOW (ref 4.22–5.81)
RDW: 13.3 % (ref 11.5–15.5)
WBC: 6.7 K/uL (ref 4.0–10.5)
nRBC: 0 % (ref 0.0–0.2)

## 2023-10-22 MED ORDER — HYDROCODONE-ACETAMINOPHEN 7.5-325 MG/15ML PO SOLN: 10.0000 mL | Freq: Four times a day (QID) | ORAL | Status: DC | PRN

## 2023-10-22 MED ORDER — HYDROCODONE-ACETAMINOPHEN 7.5-325 MG PO TABS
1.0000 | ORAL_TABLET | Freq: Four times a day (QID) | ORAL | Status: DC | PRN
Start: 2023-10-22 — End: 2023-10-22
  Administered 2023-10-22 (×2): 1 via ORAL
  Filled 2023-10-22 (×2): qty 1

## 2023-10-22 MED ORDER — ONDANSETRON HCL 4 MG PO TABS
8.0000 mg | ORAL_TABLET | Freq: Three times a day (TID) | ORAL | Status: DC
  Administered 2023-10-22: 8 mg via ORAL
  Filled 2023-10-22 (×4): qty 2

## 2023-10-22 MED ORDER — SODIUM CHLORIDE 0.9 % IV SOLN
8.0000 mg | Freq: Three times a day (TID) | INTRAVENOUS | Status: DC
  Administered 2023-10-22 – 2023-10-24 (×5): 8 mg via INTRAVENOUS
  Filled 2023-10-22 (×8): qty 4

## 2023-10-22 MED ORDER — FUROSEMIDE 10 MG/ML IJ SOLN
40.0000 mg | Freq: Three times a day (TID) | INTRAMUSCULAR | Status: DC
  Administered 2023-10-22 – 2023-10-23 (×3): 40 mg via INTRAVENOUS
  Filled 2023-10-22 (×2): qty 4

## 2023-10-22 MED ORDER — POTASSIUM CHLORIDE CRYS ER 20 MEQ PO TBCR
40.0000 meq | EXTENDED_RELEASE_TABLET | Freq: Every day | ORAL | Status: DC
Start: 2023-10-22 — End: 2023-10-24
  Administered 2023-10-22: 40 meq via ORAL
  Filled 2023-10-22: qty 2

## 2023-10-22 MED ORDER — POTASSIUM CHLORIDE 20 MEQ PO PACK
20.0000 meq | PACK | Freq: Once | ORAL | Status: AC
  Administered 2023-10-22: 20 meq via ORAL
  Filled 2023-10-22: qty 1

## 2023-10-22 MED ORDER — FLUTICASONE PROPIONATE 50 MCG/ACT NA SUSP
1.0000 | Freq: Every day | NASAL | Status: DC
  Filled 2023-10-22: qty 16

## 2023-10-22 MED ORDER — HYDROCODONE-ACETAMINOPHEN 7.5-325 MG/15ML PO SOLN
10.0000 mL | Freq: Four times a day (QID) | ORAL | Status: DC | PRN
  Administered 2023-10-22 – 2023-10-24 (×5): 10 mL via ORAL
  Filled 2023-10-22 (×6): qty 15

## 2023-10-22 MED ORDER — LORATADINE 10 MG PO TABS
10.0000 mg | ORAL_TABLET | Freq: Every day | ORAL | Status: DC
  Administered 2023-10-22 – 2023-10-24 (×3): 10 mg via ORAL
  Filled 2023-10-22 (×3): qty 1

## 2023-10-22 MED ORDER — SODIUM CHLORIDE 0.9 % IV SOLN
12.5000 mg | Freq: Four times a day (QID) | INTRAVENOUS | Status: DC | PRN
  Administered 2023-10-23: 12.5 mg via INTRAVENOUS
  Filled 2023-10-22: qty 12.5

## 2023-10-22 MED ORDER — FLUTICASONE PROPIONATE 50 MCG/ACT NA SUSP: 2.0000 | Freq: Every day | NASAL | Status: DC | PRN

## 2023-10-22 NOTE — Consult Note (Addendum)
 Cardiology Consultation   Patient ID: Randall [name] MRN: [id number]; DOB: December 15, 1955  Admit date: 10/21/2023 Date of Consult: 10/22/2023  PCP:  Lynwood Laneta ORN, PA-C    HeartCare Providers Cardiologist:  Alm Clay, MD  Cardiology APP:  Rana Lum CROME, NP       Patient Profile: Randall [name] is a 68 y.o. male with a hx of hypertension, hyperlipidemia, DM2, right bundle branch block, laryngeal cancer and history of diastolic heart failure who is being seen 10/22/2023 for the evaluation of CHF at the request of Dr. Fairy.  History of Present Illness: [name] is a pleasant 68 year old male with past medical history of hypertension, hyperlipidemia, DM2, right bundle branch block, laryngeal cancer and history of diastolic heart failure.  Echocardiogram obtained in November 2022 showed EF 45 to 50%, normal RV.  By January 2025, ejection fraction has normalized to 55 to 60%.  Repeat echocardiogram on 06/12/2023 continue to show normal EF of 50 to 55%, regional wall motion abnormality with inferolateral hypokinesis, mild MR.  He underwent cardiac catheterization on the same day that showed nonobstructive CAD with only 60% RPDA stenosis and a codominant system, normal EDP and normal EF.  He had issue with hyponatremia on chlorthalidone  and only uses chlorthalidone  and Lasix  as needed.  He was last seen in the cardiology office by Lum Rana, NP at which time he was doing well.   He was in his usual state of health until Friday.  He says he has been lagging behind household work for the past year due to cancer status.  He was trying to get more done.  He was up for 22 hours working in his backyard.  Near the end of his work, he was having increased lower extremity edema which is not unusual for him due to history of lymphedema and also shortness of breath.  He returned to the house to rest.  After he rested, he came outside to cut the last shrub however became extremely  dyspneic.  He described the feeling of breathing through a straw.  He says he typically sleep at a 45 degree angle, however Friday night, he has to sleep upright in a recliner.  He took as needed dose of chlorthalidone  and Lasix  40 mg however his symptoms did not improve on Saturday.  This same prompted him to seek medical attention in urgent care.  Imaging at the urgent care indicated possible heart failure, he was subsequently sent to [facility] for further evaluation.  Chest x-ray showed evidence of pulmonary edema.  CT showed bilateral large pleural effusion posterior to the lung.  O2 saturation dropped down to the low 80s with exertion.  He was admitted by hospitalist service.  So far patient has been given 2 doses of 40 mg IV Lasix  and put out 4 L of urine.  Cardiology service consulted for heart failure.   Past Medical History:  Diagnosis Date   Arthritis    CHF (congestive heart failure) (HCC)    CKD (chronic kidney disease) stage 2, GFR 60-89 ml/min    Complication of anesthesia    Diabetes mellitus without complication (HCC)    Elevated coronary artery calcium  score 02/02/2021   GERD (gastroesophageal reflux disease)    History of kidney stones 2014   Hypertension    Kidney stone 06/17/2015   Laryngeal mass    squamous carcinoid cancer   Lymphedema    PONV (postoperative nausea and vomiting)    Rupture of right quadriceps  tendon 09/21/2020   - s/p srugical repair   Squamous cell carcinoma of neck    Thoracic aortic atherosclerosis    Tuberculosis 1980   Type 2 diabetes mellitus without complication, without long-term current use of insulin  (HCC) 03/21/2020    Past Surgical History:  Procedure Laterality Date   INGUINAL HERNIA REPAIR Bilateral 06/15/2023   Procedure: REPAIR, HERNIA, INGUINAL, LAPAROSCOPIC;  Surgeon: Rubin Calamity, MD;  Location: [facility] OR;  Service: General;  Laterality: Bilateral;  LAPAROSCOPIC BILATERAL INGUINAL HERNIA REPAIR WITH MESH   KIDNEY SURGERY      KNEE SURGERY     LEFT HEART CATH AND CORONARY ANGIOGRAPHY N/A 06/12/2023   Procedure: LEFT HEART CATH AND CORONARY ANGIOGRAPHY;  Surgeon: Anner Alm ORN, MD;  Location: [facility] INVASIVE CV LAB;  Service: Cardiovascular;  Laterality: N/A;   LITHOTRIPSY     MICROLARYNGOSCOPY Bilateral 06/17/2022   Procedure: DIRECT LARYNGOSCOPY WITH BIOPSY;  Surgeon: Llewellyn Gerard LABOR, DO;  Location: [facility] OR;  Service: ENT;  Laterality: Bilateral;   QUADRICEPS TENDON REPAIR Right 09/25/2020   Procedure: REPAIR RIGHT QUADRICEP TENDON;  Surgeon: Harden Jerona GAILS, MD;  Location[facility] OR;  Service: Orthopedics;  Laterality: Right;   TONSILLECTOMY     as a child     Home Medications:  Prior to Admission medications   Medication Sig Start Date End Date Taking? Authorizing Provider  aspirin  EC 81 MG tablet Take 81 mg by mouth daily. Swallow whole.   Yes [provider]  atorvastatin  (LIPITOR) 80 MG tablet Take 1 tablet (80 mg total) by mouth daily. 06/13/23  Yes Wouk, Devaughn Sayres, MD  carvedilol  (COREG ) 25 MG tablet Take 25 mg by mouth in the morning and at bedtime.   Yes [provider]  chlorhexidine  (PERIDEX ) 0.12 % solution Use as directed 10 mLs in the mouth or throat 2 (two) times daily.   Yes [provider]  chlorthalidone  (HYGROTON ) 25 MG tablet Take 25 mg by mouth daily as needed (for swelling).   Yes [provider]  Continuous Glucose Sensor (FREESTYLE LIBRE 3 PLUS SENSOR) MISC REPLACE SENSOR EVERY 2 WEEKS - DIAGNOSIS TYPE 2 DM   Yes [provider]  docusate sodium  (COLACE) 100 MG capsule Take 100 mg by mouth in the morning, at noon, and at bedtime.   Yes [provider]  Dulaglutide (TRULICITY [facility]) Inject 0.5 mg into the skin once a week. Thursday   Yes [provider]  furosemide  (LASIX ) 20 MG tablet Take 1 tablet (20 mg total) by mouth daily as needed for fluid or edema. Patient taking differently: Take 20-40 mg by mouth daily as needed for fluid  or edema. 09/26/23  Yes Rana Lum CROME, NP  geriatric multivitamins-minerals (ELDERTONIC/GEVRABON) LIQD Take 15 mLs by mouth daily.   Yes [provider]  HYDROcodone -acetaminophen  (HYCET) 7.5-325 mg/15 ml solution Take 10 mL up to TID as needed for pain - not to exceed 30mL daily. 02/27/23  Yes Izell Domino, MD  levocetirizine SHELDON ALLERGY 24HR) 5 MG tablet Take 1 tablet (5 mg total) by mouth every evening. 10/09/23  Yes Soldatova, Liuba, MD  loperamide  (IMODIUM  A-D) 2 MG tablet Take 2 mg by mouth in the morning, at noon, and at bedtime.   Yes [provider]  LORazepam  (ATIVAN ) 0.5 MG tablet TAKE 1 TABLET (0.5 MG TOTAL) BY MOUTH EVERY 8 (EIGHT) HOURS. TAKE AS NEEDED FOR AVERSION TO FEEDINGS   Yes [provider]  magic mouthwash SOLN Take 10 mLs by mouth 4 (  four) times daily. Suspension contains equal amounts of Maalox Extra Strength, nystatin, and diphenhydramine .   Yes [provider]  meloxicam  (MOBIC ) 15 MG tablet Take 15 mg by mouth in the morning and at bedtime. 09/07/20  Yes [provider]  Multiple Vitamin (MULTIVITAMIN PO) Take 1 tablet by mouth daily.   Yes [provider]  ondansetron  (ZOFRAN ) 8 MG tablet Take 8 mg by mouth in the morning, at noon, and at bedtime.   Yes [provider]  OVER THE COUNTER MEDICATION Take 30 mLs by mouth every 6 (six) hours as needed (Pain). PainQuil   Yes [provider]  OVER THE COUNTER MEDICATION Take 2 tablets by mouth in the morning and at bedtime. [name]   Yes [provider]  pantoprazole  (PROTONIX ) 40 MG tablet Take 40 mg by mouth daily.   Yes [provider]  polyethylene glycol (MIRALAX / GLYCOLAX) 17 g packet Take 17 g by mouth daily as needed for moderate constipation.   Yes [provider]  potassium chloride  (MICRO-K ) 10 MEQ CR capsule Take 10 mEq by mouth 2 (two) times daily. 09/25/23  Yes [provider]  potassium chloride  SA  (KLOR-CON  M) 20 MEQ tablet TAKE 20 MEQ AS NEEDED WHEN TAKING THE LASIX . 09/26/23  Yes Fountain, Madison L, NP  Sod Fluoride-Potassium Nitrate (PREVIDENT 5000 ENAMEL PROTECT DT) Place 1 application  onto teeth in the morning and at bedtime.   Yes [provider]  solifenacin (VESICARE) 10 MG tablet Take 10 mg by mouth daily.   Yes [provider]  tadalafil (CIALIS) 20 MG tablet Take 20 mg by mouth daily as needed for erectile dysfunction. 07/27/20  Yes [provider]  tadalafil (CIALIS) 5 MG tablet Take 5 mg by mouth daily.   Yes [provider]  telmisartan (MICARDIS) 80 MG tablet Take 80 mg by mouth daily.   Yes [provider]  amLODipine  (NORVASC ) 10 MG tablet Take 1 tablet (10 mg total) by mouth daily. Patient not taking: Reported on 10/21/2023 06/13/23   Kandis Devaughn Sayres, MD    Scheduled Meds:  aspirin  EC  81 mg Oral Daily   atorvastatin   80 mg Oral Daily   carvedilol   25 mg Oral BID WC   enoxaparin  (LOVENOX ) injection  40 mg Subcutaneous Q24H   fluticasone  1 spray Each Nare Daily   furosemide   40 mg Intravenous BID   insulin  aspart  0-5 Units Subcutaneous QHS   insulin  aspart  0-6 Units Subcutaneous TID WC   irbesartan   75 mg Oral Daily   loratadine  10 mg Oral Daily   polyethylene glycol  17 g Oral Daily   potassium chloride   40 mEq Oral Daily   sodium chloride  flush  3 mL Intravenous Q12H   sodium chloride  flush  3 mL Intravenous Q12H   Continuous Infusions:  sodium chloride      promethazine (PHENERGAN) injection (IM or IVPB)     PRN Meds: sodium chloride , acetaminophen  **OR** acetaminophen , fluticasone, hydrALAZINE , HYDROcodone -acetaminophen , ondansetron  **OR** ondansetron  (ZOFRAN ) IV, promethazine (PHENERGAN) injection (IM or IVPB), senna-docusate, sodium chloride  flush  Allergies:    Allergies  Allergen Reactions   Norgesic Forte [Orphenadrine-Aspirin -Caffeine] Other (See Comments)    Unknown reaction    Latex Hives and  Rash    Blisters    Social History:   Social History   Socioeconomic History   Marital status: Married    Spouse name: Not on file   Number of children: 1  Years of education: Not on file   Highest education level: Not on file  Occupational History   Not on file  Tobacco Use   Smoking status: Never   Smokeless tobacco: Never  Vaping Use   Vaping status: Never Used  Substance and Sexual Activity   Alcohol use: Never   Drug use: Never   Sexual activity: Not Currently  Other Topics Concern   Not on file  Social History Narrative   Usually very active - but no routine exercise. 1 daughter, 2 stepsons   Social Drivers of Corporate investment banker Strain: Low Risk  (08/14/2023)   Received from [facility]   Overall Financial Resource Strain (CARDIA)    How hard is it for you to pay for the very basics like food, housing, medical care, and heating?: Not very hard  Food Insecurity: No Food Insecurity (10/21/2023)   Hunger Vital Sign    Worried About Running Out of Food in the Last Year: Never true    Ran Out of Food in the Last Year: Never true  Transportation Needs: No Transportation Needs (10/21/2023)   PRAPARE - Administrator, Civil Service (Medical): No    Lack of Transportation (Non-Medical): No  Physical Activity: Sufficiently Active (08/14/2023)   Received from [facility]   Exercise Vital Sign    On average, how many days per week do you engage in moderate to strenuous exercise (like a brisk walk)?: 7 days    On average, how many minutes do you engage in exercise at this level?: 150+ min  Stress: No Stress Concern Present (08/14/2023)   Received from [facility] - Occupational Stress Questionnaire    Do you feel stress - tense, restless, nervous, or anxious, or unable to sleep at night because your mind is troubled all the time - these days?: Not at all  Social Connections: Socially Isolated (10/21/2023)    Social Connection and Isolation Panel    Frequency of Communication with Friends and Family: Never    Frequency of Social Gatherings with Friends and Family: Never    Attends Religious Services: Patient declined    Database administrator or Organizations: No    Attends Banker Meetings: Never    Marital Status: Married  Catering manager Violence: Not At Risk (10/21/2023)   Humiliation, Afraid, Rape, and Kick questionnaire    Fear of Current or Ex-Partner: No    Emotionally Abused: No    Physically Abused: No    Sexually Abused: No    Family History:   History reviewed. No pertinent family history.   ROS:  Please see the history of present illness.   All other ROS reviewed and negative.     Physical Exam/Data: Vitals:   10/21/23 2239 10/22/23 0043 10/22/23 0500 10/22/23 0712  BP: (!) 166/76 132/72 (!) 164/81 (!) 164/81  Pulse: 82  67 73  Resp: 16 15 16 16   Temp: 99 F (37.2 C) 98.6 F (37 C) 98.4 F (36.9 C) 98.4 F (36.9 C)  TempSrc: Oral Oral Oral Oral  SpO2: 97%  98% 99%  Weight:      Height:        Intake/Output Summary (Last 24 hours) at 10/22/2023 1102 Last data filed at 10/22/2023 0846 Gross per 24 hour  Intake 240 ml  Output 4475 ml  Net -4235 ml      10/21/2023   10:37 PM 10/21/2023  3:45 PM 09/26/2023    3:08 PM  Last 3 Weights  Weight (lbs) 166 lb 14.2 oz 141 lb 15.6 oz 142 lb  Weight (kg) 75.7 kg 64.4 kg 64.411 kg     Body mass index is 23.95 kg/m.  General:  Well nourished, well developed, in no acute distress HEENT: normal Neck: no JVD Vascular: No carotid bruits; Distal pulses 2+ bilaterally Cardiac:  normal S1, S2; RRR; no murmur  Lungs:  clear to auscultation bilaterally, no wheezing, rhonchi or rales  Abd: soft, nontender, no hepatomegaly  Ext: no edema Musculoskeletal:  No deformities, BUE and BLE strength normal and equal Skin: warm and dry  Neuro:  CNs 2-12 intact, no focal abnormalities noted Psych:  Normal  affect   EKG:  The EKG was personally reviewed and demonstrates: Normal sinus rhythm, right bundle branch block, nonspecific ST-T wave changes.  First-degree AV block. Telemetry:  Telemetry was personally reviewed and demonstrates: Normal sinus rhythm, no significant ventricular ectopy.  Relevant CV Studies:   Echo 12/08/2020  1. Left ventricular ejection fraction, by estimation, is 45 to 50%. The  left ventricle has mildly decreased function. The left ventricle  demonstrates global hypokinesis. Left ventricular diastolic parameters are  consistent with Grade I diastolic  dysfunction (impaired relaxation).   2. Right ventricular systolic function is normal. The right ventricular  size is normal.   3. The mitral valve is normal in structure. Mild mitral valve  regurgitation. No evidence of mitral stenosis.   4. The aortic valve is normal in structure. There is mild calcification  of the aortic valve. There is mild thickening of the aortic valve. Aortic  valve regurgitation is not visualized. Aortic valve  sclerosis/calcification is present, without any  evidence of aortic stenosis.   5. The inferior vena cava is normal in size with greater than 50%  respiratory variability, suggesting right atrial pressure of 3 mmHg.    Echo 06/12/2023  1. Left ventricular ejection fraction, by estimation, is 55 to 60%. Left  ventricular ejection fraction by 2D MOD biplane is 57.6 %. The left  ventricle has normal function. The left ventricle demonstrates regional  wall motion abnormalities with  inferolateral hypokinesis. Left ventricular diastolic parameters are  consistent with Grade II diastolic dysfunction (pseudonormalization).   2. Right ventricular systolic function is normal. The right ventricular  size is normal. Tricuspid regurgitation signal is inadequate for assessing  PA pressure.   3. Left atrial size was mildly dilated.   4. The mitral valve is normal in structure. Mild mitral valve   regurgitation. No evidence of mitral stenosis.   5. The aortic valve is tricuspid. There is mild calcification of the  aortic valve. Aortic valve regurgitation is not visualized. No aortic  stenosis is present.   6. The inferior vena cava is dilated in size with <50% respiratory  variability, suggesting right atrial pressure of 15 mmHg.    Cath 06/12/2023   There is no aortic valve stenosis.   In the absence of any other complications or medical issues, we expect the patient to be ready for discharge from a cath perspective on 06/12/2023.   Recommend Aspirin  81mg  daily for moderate CAD.   Diagnostic    Dominance: Co-dominant  POST-OPERATIVE DIAGNOSIS:   Moderate single-vessel disease with 60% RPDA stenosis and a codominant system. Normal LVEF with normal EDP   RECOMMENDATIONS:     In the absence of any other complications or medical issues, we expect the patient to be ready for  discharge from a cath perspective on 06/12/2023.   Recommend Aspirin  81mg  daily for moderate CAD.     Laboratory Data: High Sensitivity Troponin:   Recent Labs  Lab 10/21/23 1553 10/21/23 1753  TROPONINIHS 14 18*     Chemistry Recent Labs  Lab 10/21/23 1602 10/22/23 0215  NA 132* 134*  K 4.0 3.4*  CL 98 100  CO2 24 24  GLUCOSE 113* 111*  BUN 13 12  CREATININE 0.84 0.79  CALCIUM  8.8* 8.7*  GFRNONAA >60 >60  ANIONGAP 10 10    Recent Labs  Lab 10/21/23 1602 10/22/23 0215  PROT 6.2* 5.7*  ALBUMIN 3.8 3.4*  AST 24 20  ALT 20 18  ALKPHOS 46 42  BILITOT 0.7 0.7   Lipids No results for input(s): CHOL, TRIG, HDL, LABVLDL, LDLCALC, CHOLHDL in the last 168 hours.  Hematology Recent Labs  Lab 10/21/23 1602 10/22/23 0215  WBC 8.5 6.7  RBC 3.79* 3.58*  HGB 11.8* 11.1*  HCT 35.7* 33.2*  MCV 94.2 92.7  MCH 31.1 31.0  MCHC 33.1 33.4  RDW 13.3 13.3  PLT 191 186   Thyroid  No results for input(s): TSH, FREET4 in the last 168 hours.  BNP Recent Labs  Lab 10/21/23 1554   BNP 681.3*    DDimer No results for input(s): DDIMER in the last 168 hours.  Radiology/Studies:  CT Angio Chest PE W and/or Wo Contrast Result Date: 10/21/2023 CLINICAL DATA:  Pulmonary embolism (PE) suspected, low to intermediate prob, neg D-dimer. Shortness of breath EXAM: CT ANGIOGRAPHY CHEST WITH CONTRAST TECHNIQUE: Multidetector CT imaging of the chest was performed using the standard protocol during bolus administration of intravenous contrast. Multiplanar CT image reconstructions and MIPs were obtained to evaluate the vascular anatomy. RADIATION DOSE REDUCTION: This exam was performed according to the departmental dose-optimization program which includes automated exposure control, adjustment of the mA and/or kV according to patient size and/or use of iterative reconstruction technique. CONTRAST:  75mL OMNIPAQUE  IOHEXOL  350 MG/ML SOLN COMPARISON:  02/09/2023 FINDINGS: Cardiovascular: Heart is normal size. Aorta is normal caliber. Moderate coronary artery and aortic atherosclerosis. No filling defects in the pulmonary arteries to suggest pulmonary emboli. Mediastinum/Nodes: No mediastinal, hilar, or axillary adenopathy. Trachea and esophagus are unremarkable. Thyroid  unremarkable. Lungs/Pleura: Moderate to large bilateral pleural effusions. Mild vascular congestion. Interstitial prominence and ground-glass perihilar opacities suggest edema. Upper Abdomen: No acute findings Musculoskeletal: Chest wall soft tissues are unremarkable. No acute bony abnormality. Review of the MIP images confirms the above findings. IMPRESSION: No evidence of pulmonary embolus. Moderate to large bilateral pleural effusions. Suspect mild interstitial edema. Coronary artery disease. Aortic Atherosclerosis (ICD10-I70.0). Electronically Signed   By: Franky Crease M.D.   On: 10/21/2023 19:48   DG Chest 2 View Result Date: 10/21/2023 CLINICAL DATA:  Shortness of breath. EXAM: CHEST - 2 VIEW COMPARISON:  August 31, 2023  FINDINGS: The heart size and mediastinal contours are within normal limits. Moderate severity diffusely increased interstitial lung markings are seen. Mild atelectatic changes are noted within the bilateral lung bases. No pleural effusion or pneumothorax is identified. The visualized skeletal structures are unremarkable. IMPRESSION: Moderate severity interstitial edema with mild bibasilar atelectasis. Electronically Signed   By: Suzen Dials M.D.   On: 10/21/2023 16:45     Assessment and Plan: Acute on chronic diastolic heart failure: Suspect likely exacerbated by spikes in blood pressure with prolonged work.  Recent echocardiogram in June 2025 showed EF 55 to 60%.  CTA of the chest showed no PE, moderate  to large bilateral pleural effusion posterior to the lung.  Suspect mild interstitial edema.  BNP elevated at 600.  Continue 40 mg twice a day of IV Lasix .  Nonobstructive CAD: Denies any recent chest pain  Hypertension: On carvedilol  and telmisartan at home.  Per patient, his blood pressure is usually well-controlled in the 110s range.  He was working outside for 22 hours before he went into heart failure, suspect he may had significant jump in the blood pressure during work.  Hyperlipidemia: Continue home dose of atorvastatin   DM 2    Risk Assessment/Risk Scores:     [place]  Heart Association (NYHA) Functional Class NYHA Class IV       For questions or updates, please contact [facility] Please consult [email or web address] for contact info under    Bonney Scot Ford, PA  10/22/2023 11:02 AM   Silver KATHEE Nightingale was seen by me today along with Scot Ford. I have personally performed an evaluation on this patient.  My findings are as follows: 68 y.o. male with a past history as above developed significant shortness of breath yesterday.  He worked around his house for approximately 22 hours on Friday.  Here the end of the work, he developed increased lower extremity edema  and shortness of breath.  He usually sleeps at a 45 degree angle due to lymphedema.  He found that it was difficult for him to sleep at this angle and it was sleeping upright.  He came to [facility] for further evaluation and was found to have desaturations with ambulation and pleural effusions with pulmonary edema on CT scan.  He was received IV Lasix  with 4 L negative.  He continues to be somewhat short of breath.  He does complain of lymphedema discomfort in his neck.  Data: EKG(s) and pertinent labs, studies, etc were personally reviewed and interpreted by me:  EKG, telemetry, labs Otherwise, I agree with data as outlined by the advanced practice provider.  Exam performed by me: Gen: No acute distress Neck: No JVD Cardiac: Regular rhythm Lungs: Decreased breath sounds at the bases Extremities: No edema  My Assessment and Plan:  1.  Acute on chronic diastolic heart failure: Likely exacerbated by elevated blood pressures during exertion.  Ejection fraction normal June 2025.  CT shows no PE but pleural effusions and edema.  Has had some improvement with diuresis.  Would continue Lasix  40 mg twice daily.  2.  Nonobstructive coronary artery disease: Denies chest pain  3.  Hypertension: On telmisartan and carvedilol  at home.  Blood pressure usually well-controlled.  4.  Hyperlipidemia: Continue atorvastatin   Signed,  Doreena Maulden Gladis Norton, MD  10/22/2023 11:34 AM

## 2023-10-22 NOTE — Progress Notes (Signed)
  Echocardiogram 2D Echocardiogram has been performed.  Randall [name] 10/22/2023, 2:28 PM

## 2023-10-22 NOTE — Progress Notes (Signed)
 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   Subjective: -Upset about numerous things, has a hard time accepting diagnosis of CHF-wants to attribute this all to lymphedema, -wants more frequent pain medicines, requesting for more nausea meds  Assessment and Plan:  Acute hypoxic respiratory failure Acute on chronic diastolic CHF Hypertensive urgency Bilateral pleural effusions -Last echo 6/25 with EF 55-60%, grade 2 DD, normal RV -Continue Lasix  40 mg IV twice daily - Continue irbesartan  and Coreg  - Follow-up repeat echo, repeat x-ray tomorrow may need thoracentesis - Add GDMT as tolerated  Type 2 diabetes mellitus - Check HbA1c, CBGs are stable sliding scale insulin   History of CAD -Moderate single-vessel disease with 60% RPDA stenosis and a codominant system noted on cath 6/25 - Continue aspirin , Coreg  and statin  Hypokalemia Replete  Head and neck CA, 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 Chronic pain and narcotic dependence -Requesting more frequent narcotics and nausea meds at this time   DVT prophylaxis: lovenox  Code Status: Full Code Family Communication: Wife at bedside Disposition Plan:   Consultants:    Procedures:   Antimicrobials:    Objective: Vitals:   10/21/23 2239 10/22/23 0043 10/22/23 0500 10/22/23 0712  BP: (!) 166/76 132/72 (!) 164/81 (!) 164/81  Pulse: 82  67 73  Resp: 16 15 16 16   Temp: 99 F (37.2 C) 98.6 F (37 C) 98.4 F (36.9 C) 98.4 F (36.9 C)  TempSrc: Oral Oral Oral Oral  SpO2: 97%  98% 99%  Weight:       Height:        Intake/Output Summary (Last 24 hours) at 10/22/2023 1032 Last data filed at 10/22/2023 0846 Gross per 24 hour  Intake 240 ml  Output 4475 ml  Net -4235 ml   Filed Weights   10/21/23 1545 10/21/23 2237  Weight: 64.4 kg 75.7 kg    Examination:  General exam: Appears calm and comfortable  Respiratory system: Clear to auscultation Cardiovascular system: S1 & S2 heard, RRR.  Abd: nondistended, soft and nontender.Normal bowel sounds heard. Central nervous system: Alert and oriented. No focal neurological deficits. Extremities: no edema Skin: No rashes Psychiatry:  Mood & affect appropriate.     Data Reviewed:   CBC: Recent Labs  Lab 10/21/23 1602 10/22/23 0215  WBC 8.5 6.7  NEUTROABS 6.9  --   HGB 11.8* 11.1*  HCT 35.7* 33.2*  MCV 94.2 92.7  PLT 191 186   Basic Metabolic Panel: Recent Labs  Lab 10/21/23 1602 10/22/23 0215  NA 132* 134*  K 4.0 3.4*  CL 98 100  CO2 24 24  GLUCOSE 113* 111*  BUN 13 12  CREATININE 0.84 0.79  CALCIUM  8.8* 8.7*   GFR: Estimated Creatinine Clearance: 92.5 mL/min (by C-G formula based on SCr of 0.79 mg/dL). Liver Function Tests: Recent Labs  Lab 10/21/23 1602 10/22/23 0215  AST 24 20  ALT 20 18  ALKPHOS 46 42  BILITOT 0.7 0.7  PROT 6.2* 5.7*  ALBUMIN 3.8 3.4*   No results for input(s): LIPASE, AMYLASE in the last 168 hours. No results for input(s): AMMONIA  in the last 168 hours. Coagulation Profile: No results for input(s): INR, PROTIME in the last 168 hours. Cardiac Enzymes: No results for input(s): CKTOTAL, CKMB, CKMBINDEX, TROPONINI in the last 168 hours. BNP (last 3 results) No results for input(s): PROBNP in the last 8760 hours. HbA1C: No results for input(s): HGBA1C in the last 72 hours. CBG: Recent Labs  Lab 10/21/23 2126 10/21/23 2245 10/22/23 1030  GLUCAP 124* 153* 116*   Lipid Profile: No results for input(s): CHOL, HDL, LDLCALC, TRIG, CHOLHDL,  LDLDIRECT in the last 72 hours. Thyroid  Function Tests: No results for input(s): TSH, T4TOTAL, FREET4, T3FREE, THYROIDAB in the last 72 hours. Anemia Panel: No results for input(s): VITAMINB12, FOLATE, FERRITIN, TIBC, IRON, RETICCTPCT in the last 72 hours. Urine analysis:    Component Value Date/Time   COLORURINE COLORLESS (A) 08/14/2023 1328   APPEARANCEUR CLEAR 08/14/2023 1328   LABSPEC 1.005 08/14/2023 1328   PHURINE 7.5 08/14/2023 1328   GLUCOSEU NEGATIVE 08/14/2023 1328   HGBUR NEGATIVE 08/14/2023 1328   BILIRUBINUR NEGATIVE 08/14/2023 1328   KETONESUR NEGATIVE 08/14/2023 1328   PROTEINUR NEGATIVE 08/14/2023 1328   UROBILINOGEN 1.0 03/02/2010 2317   NITRITE NEGATIVE 08/14/2023 1328   LEUKOCYTESUR NEGATIVE 08/14/2023 1328   Sepsis Labs: @LABRCNTIP (procalcitonin:4,lacticidven:4)  ) Recent Results (from the past 240 hours)  Resp panel by RT-PCR (RSV, Flu A&B, Covid) Anterior Nasal Swab     Status: None   Collection Time: 10/21/23  4:02 PM   Specimen: Anterior Nasal Swab  Result Value Ref Range Status   SARS Coronavirus 2 by RT PCR NEGATIVE NEGATIVE Final   Influenza A by PCR NEGATIVE NEGATIVE Final   Influenza B by PCR NEGATIVE NEGATIVE Final    Comment: (NOTE) The Xpert Xpress SARS-CoV-2/FLU/RSV plus assay is intended as an aid in the diagnosis of influenza from Nasopharyngeal swab specimens and should not be used as a sole basis for treatment. Nasal washings and aspirates are unacceptable for Xpert Xpress SARS-CoV-2/FLU/RSV testing.  Fact Sheet for Patients: BloggerCourse.com  Fact Sheet for Healthcare Providers: SeriousBroker.it  This test is not yet approved or cleared by the [place]  FDA and has been authorized for detection and/or diagnosis of SARS-CoV-2 by FDA under an Emergency Use Authorization (EUA). This EUA will remain in effect (meaning this test can be used) for the duration  of the COVID-19 declaration under Section 564(b)(1) of the Act, 21 U.S.C. section 360bbb-3(b)(1), unless the authorization is terminated or revoked.     Resp Syncytial Virus by PCR NEGATIVE NEGATIVE Final    Comment: (NOTE) Fact Sheet for Patients: BloggerCourse.com  Fact Sheet for Healthcare Providers: SeriousBroker.it  This test is not yet approved or cleared by the [place]  FDA and has been authorized for detection and/or diagnosis of SARS-CoV-2 by FDA under an Emergency Use Authorization (EUA). This EUA will remain in effect (meaning this test can be used) for the duration of the COVID-19 declaration under Section 564(b)(1) of the Act, 21 U.S.C. section 360bbb-3(b)(1), unless the authorization is terminated or revoked.  Performed at [facility], [address] [place], [place] [address]      Radiology Studies: CT Angio Chest PE W and/or Wo Contrast Result Date: 10/21/2023 CLINICAL DATA:  Pulmonary embolism (PE) suspected, low to intermediate prob, neg D-dimer. Shortness of breath EXAM: CT ANGIOGRAPHY CHEST WITH CONTRAST TECHNIQUE: Multidetector CT imaging of the chest was performed using the standard protocol during bolus administration of intravenous contrast. Multiplanar CT image reconstructions and MIPs were obtained to evaluate the  vascular anatomy. RADIATION DOSE REDUCTION: This exam was performed according to the departmental dose-optimization program which includes automated exposure control, adjustment of the mA and/or kV according to patient size and/or use of iterative reconstruction technique. CONTRAST:  75mL OMNIPAQUE  IOHEXOL  350 MG/ML SOLN COMPARISON:  02/09/2023 FINDINGS: Cardiovascular: Heart is normal size. Aorta is normal caliber. Moderate coronary artery and aortic atherosclerosis. No filling defects in the pulmonary arteries to suggest pulmonary emboli. Mediastinum/Nodes: No mediastinal, hilar, or  axillary adenopathy. Trachea and esophagus are unremarkable. Thyroid  unremarkable. Lungs/Pleura: Moderate to large bilateral pleural effusions. Mild vascular congestion. Interstitial prominence and ground-glass perihilar opacities suggest edema. Upper Abdomen: No acute findings Musculoskeletal: Chest wall soft tissues are unremarkable. No acute bony abnormality. Review of the MIP images confirms the above findings. IMPRESSION: No evidence of pulmonary embolus. Moderate to large bilateral pleural effusions. Suspect mild interstitial edema. Coronary artery disease. Aortic Atherosclerosis (ICD10-I70.0). Electronically Signed   By: Franky Crease M.D.   On: 10/21/2023 19:48   DG Chest 2 View Result Date: 10/21/2023 CLINICAL DATA:  Shortness of breath. EXAM: CHEST - 2 VIEW COMPARISON:  August 31, 2023 FINDINGS: The heart size and mediastinal contours are within normal limits. Moderate severity diffusely increased interstitial lung markings are seen. Mild atelectatic changes are noted within the bilateral lung bases. No pleural effusion or pneumothorax is identified. The visualized skeletal structures are unremarkable. IMPRESSION: Moderate severity interstitial edema with mild bibasilar atelectasis. Electronically Signed   By: Suzen Dials M.D.   On: 10/21/2023 16:45     Scheduled Meds:  aspirin  EC  81 mg Oral Daily   atorvastatin   80 mg Oral Daily   carvedilol   25 mg Oral BID WC   enoxaparin  (LOVENOX ) injection  40 mg Subcutaneous Q24H   fluticasone  1 spray Each Nare Daily   furosemide   40 mg Intravenous BID   insulin  aspart  0-5 Units Subcutaneous QHS   insulin  aspart  0-6 Units Subcutaneous TID WC   irbesartan   75 mg Oral Daily   loratadine  10 mg Oral Daily   polyethylene glycol  17 g Oral Daily   sodium chloride  flush  3 mL Intravenous Q12H   sodium chloride  flush  3 mL Intravenous Q12H   Continuous Infusions:  sodium chloride      promethazine (PHENERGAN) injection (IM or IVPB)        LOS: 1 day    Time spent:    Sigurd Pac, MD [facility]   10/22/2023, 10:32 AM

## 2023-10-22 NOTE — Plan of Care (Signed)

## 2023-10-23 ENCOUNTER — Other Ambulatory Visit (HOSPITAL_COMMUNITY): Payer: Self-pay

## 2023-10-23 ENCOUNTER — Inpatient Hospital Stay (HOSPITAL_COMMUNITY)

## 2023-10-23 ENCOUNTER — Telehealth (HOSPITAL_COMMUNITY): Payer: Self-pay | Admitting: Pharmacy Technician

## 2023-10-23 ENCOUNTER — Encounter (HOSPITAL_COMMUNITY): Payer: Self-pay | Admitting: Internal Medicine

## 2023-10-23 DIAGNOSIS — I5043 Acute on chronic combined systolic (congestive) and diastolic (congestive) heart failure: Secondary | ICD-10-CM

## 2023-10-23 LAB — BASIC METABOLIC PANEL WITH GFR
Anion gap: 15 (ref 5–15)
BUN: 14 mg/dL (ref 8–23)
CO2: 24 mmol/L (ref 22–32)
Calcium: 9.3 mg/dL (ref 8.9–10.3)
Chloride: 94 mmol/L — ABNORMAL LOW (ref 98–111)
Creatinine, Ser: 0.97 mg/dL (ref 0.61–1.24)
GFR, Estimated: >60 mL/min (ref >60)
Glucose, Bld: 122 mg/dL — ABNORMAL HIGH (ref 70–99)
Potassium: 2.9 mmol/L — ABNORMAL LOW (ref 3.5–5.1)
Sodium: 133 mmol/L — ABNORMAL LOW (ref 135–145)

## 2023-10-23 LAB — GLUCOSE, CAPILLARY
Glucose-Capillary: 169 mg/dL — ABNORMAL HIGH (ref 70–99)
Glucose-Capillary: 133 mg/dL — ABNORMAL HIGH (ref 70–99)
Glucose-Capillary: 130 mg/dL — ABNORMAL HIGH (ref 70–99)
Glucose-Capillary: 145 mg/dL — ABNORMAL HIGH (ref 70–99)

## 2023-10-23 LAB — HEMOGLOBIN A1C
Hgb A1c MFr Bld: 5.1 % (ref 4.8–5.6)
Mean Plasma Glucose: 99.67 mg/dL

## 2023-10-23 MED ORDER — SPIRONOLACTONE 25 MG PO TABS
25.0000 mg | ORAL_TABLET | Freq: Every day | ORAL | Status: DC
  Filled 2023-10-23: qty 1

## 2023-10-23 MED ORDER — POTASSIUM CHLORIDE CRYS ER 20 MEQ PO TBCR
40.0000 meq | EXTENDED_RELEASE_TABLET | Freq: Three times a day (TID) | ORAL | Status: AC
  Administered 2023-10-23 (×2): 40 meq via ORAL
  Filled 2023-10-23 (×2): qty 2

## 2023-10-23 MED ORDER — FUROSEMIDE 10 MG/ML IJ SOLN
40.0000 mg | Freq: Two times a day (BID) | INTRAMUSCULAR | Status: DC
  Administered 2023-10-23 – 2023-10-24 (×2): 40 mg via INTRAVENOUS
  Filled 2023-10-23 (×2): qty 4

## 2023-10-23 MED ORDER — POTASSIUM CHLORIDE CRYS ER 20 MEQ PO TBCR
40.0000 meq | EXTENDED_RELEASE_TABLET | Freq: Three times a day (TID) | ORAL | Status: DC
  Filled 2023-10-23: qty 2

## 2023-10-23 MED ORDER — SACUBITRIL-VALSARTAN 24-26 MG PO TABS
1.0000 | ORAL_TABLET | Freq: Two times a day (BID) | ORAL | Status: DC
  Administered 2023-10-23 – 2023-10-24 (×3): 1 via ORAL
  Filled 2023-10-23 (×3): qty 1

## 2023-10-23 MED ORDER — EMPAGLIFLOZIN 10 MG PO TABS: 10.0000 mg | ORAL_TABLET | Freq: Every day | ORAL | Status: DC

## 2023-10-23 MED ORDER — SPIRONOLACTONE 12.5 MG HALF TABLET
12.5000 mg | ORAL_TABLET | Freq: Every day | ORAL | Status: DC
  Administered 2023-10-23 – 2023-10-24 (×2): 12.5 mg via ORAL
  Filled 2023-10-23: qty 1

## 2023-10-23 NOTE — Plan of Care (Signed)

## 2023-10-23 NOTE — Progress Notes (Signed)
 Heart Failure Nurse Navigator Progress Note  PCP: Lynwood Laneta ORN, PA-C PCP-Cardiologist: Anner Admission Diagnosis: Dyspnea Admitted from: Urgent Care  Presentation:   Randall [name] presented with exertional shortness of breath and fluid overload and fatigue for 2-3 days. Saturations at Urgent care were in the low 80's. BP 191/92, HR 77, BNP 681, Troponin 14,18.Respiratory panel negative for COVID RSV flu.  CXR with moderate severe interstitial edema with mild basilar atelectasis. CTA showed no PE, however moderate to large bilateral pleural effusion and interstitial edema.   Patient was educated on the sign and symptoms of heart failure, daily weights, when to call his doctor or go to the ED. Diet and fluid restrictions, taking all medications as prescribed and attending all medical appointments. Patient verbalized his understanding off all education. A HF TOC appointment was scheduled for 11/01/2023 @ 9 am.   ECHO/ LVEF: 45-50% Diastolic   Clinical Course:  Past Medical History:  Diagnosis Date   Arthritis    CHF (congestive heart failure) (HCC)    CKD (chronic kidney disease) stage 2, GFR 60-89 ml/min    Complication of anesthesia    Diabetes mellitus without complication (HCC)    Elevated coronary artery calcium  score 02/02/2021   GERD (gastroesophageal reflux disease)    History of kidney stones 2014   Hypertension    Kidney stone 06/17/2015   Laryngeal mass    squamous carcinoid cancer   Lymphedema    PONV (postoperative nausea and vomiting)    Rupture of right quadriceps tendon 09/21/2020   - s/p srugical repair   Squamous cell carcinoma of neck    Thoracic aortic atherosclerosis    Tuberculosis 1980   Type 2 diabetes mellitus without complication, without long-term current use of insulin  (HCC) 03/21/2020     Social History   Socioeconomic History   Marital status: Married    Spouse name: Not on file   Number of children: 1   Years of education: Not on file    Highest education level: Not on file  Occupational History   Not on file  Tobacco Use   Smoking status: Never   Smokeless tobacco: Never  Vaping Use   Vaping status: Never Used  Substance and Sexual Activity   Alcohol use: Never   Drug use: Never   Sexual activity: Not Currently  Other Topics Concern   Not on file  Social History Narrative   Usually very active - but no routine exercise. 1 daughter, 2 stepsons   Social Drivers of Corporate investment banker Strain: Low Risk  (08/14/2023)   Received from [facility]   Overall Financial Resource Strain (CARDIA)    How hard is it for you to pay for the very basics like food, housing, medical care, and heating?: Not very hard  Food Insecurity: No Food Insecurity (10/21/2023)   Hunger Vital Sign    Worried About Running Out of Food in the Last Year: Never true    Ran Out of Food in the Last Year: Never true  Transportation Needs: No Transportation Needs (10/21/2023)   PRAPARE - Administrator, Civil Service (Medical): No    Lack of Transportation (Non-Medical): No  Physical Activity: Sufficiently Active (08/14/2023)   Received from [facility]   Exercise Vital Sign    On average, how many days per week do you engage in moderate to strenuous exercise (like a brisk walk)?: 7 days    On average, how many minutes do you engage  in exercise at this level?: 150+ min  Stress: No Stress Concern Present (08/14/2023)   Received from [facility] - Occupational Stress Questionnaire    Do you feel stress - tense, restless, nervous, or anxious, or unable to sleep at night because your mind is troubled all the time - these days?: Not at all  Social Connections: Socially Isolated (10/21/2023)   Social Connection and Isolation Panel    Frequency of Communication with Friends and Family: Never    Frequency of Social Gatherings with Friends and Family: Never    Attends Religious Services:  Patient declined    Database administrator or Organizations: No    Attends Engineer, structural: Never    Marital Status: Married   Water engineer and Provision:  Detailed education and instructions provided on heart failure disease management including the following:  Signs and symptoms of Heart Failure When to call the physician Importance of daily weights Low sodium diet Fluid restriction Medication management Anticipated future follow-up appointments  Patient education given on each of the above topics.  Patient acknowledges understanding via teach back method and acceptance of all instructions.  Education Materials:  Living Better With Heart Failure Booklet, HF zone tool, & Daily Weight Tracker Tool.  Patient has scale at home: Yes Patient has pill box at home: Yes    High Risk Criteria for Readmission and/or Poor Patient Outcomes: Heart failure hospital admissions (last 6 months): 0  No Show rate: 1 %  Difficult social situation: No, Lives with his wife  Demonstrates medication adherence: Yes Primary Language: English  Literacy level: Reading, writing, and comprehension   Barriers of Care:   Diet/ fluid restrictions( drinking more then 64 oz per day)  Lack of sleep ( 30 minutes - 1 hour per night)   Considerations/Referrals:   Referral made to Heart Failure Pharmacist Stewardship: NA Referral made to Heart Failure CSW/NCM TOC: NA Referral made to Heart & Vascular TOC clinic: Yes, 11/01/2023 @ 9 am )   Items for Follow-up on DC/TOC: Continued HF education Diet/ fluid restrictions    Stephane Haddock, BSN, RN Heart Failure Teacher, adult education Only

## 2023-10-23 NOTE — Progress Notes (Addendum)
 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 - In the ED volume overloaded, hypoxic, started on diuretics  Subjective: - Feels better, breathing is improving, in better spirits today  Assessment and Plan:  Acute hypoxic respiratory failure Acute on chronic combined CHF Hypertensive urgency Bilateral pleural effusions -Last echo 6/25 with EF 55-60%, grade 2 DD, normal RV -Repeat echo with EF down to 45-50%, grade 2 DD, normal RV, wall motion abnormality -Recent cath 6/25 with moderate single-vessel disease - Improving with diuresis, he is 7 L negative, continue IV Lasix  1 more day -Continue Coreg , starting Entresto today, continue Aldactone - Repeat x-ray is improved, does not need thoracentesis  Type 2 diabetes mellitus - Stable, A1c is 5.1  History of CAD -Moderate single-vessel disease with 60% RPDA stenosis and a codominant system noted on cath 6/25 - Continue aspirin , Coreg  and statin  Hypokalemia Replete  Head and neck CA, 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  Chronic pain and narcotic dependence - Continue current regimen   DVT prophylaxis: lovenox  Code Status: Full Code Family Communication: Wife at bedside Disposition Plan: Home likely 1 to 2 days  Consultants:    Procedures:   Antimicrobials:    Objective: Vitals:   10/22/23 1900 10/22/23 1954 10/23/23 0425 10/23/23 0725  BP: (!) 163/101 (!) 168/67 (!) 163/83 (!) 167/85  Pulse: 72  79 71  Resp:  15 15 18   Temp:  98 F (36.7 C) 98.2 F (36.8 C) 98.7 F (37.1 C)  TempSrc:  Oral Oral  Oral  SpO2: 95% 98% 98% 100%  Weight:      Height:        Intake/Output Summary (Last 24 hours) at 10/23/2023 1149 Last data filed at 10/23/2023 0700 Gross per 24 hour  Intake 103.58 ml  Output 3450 ml  Net -3346.42 ml   Filed Weights   10/21/23 1545 10/21/23 2237  Weight: 64.4 kg 75.7 kg    Examination:  General exam: Appears calm and comfortable  HEENT: Positive JVD Respiratory system: decreased breath sounds at the bases Cardiovascular system: S1 & S2 heard, RRR.  Abd: nondistended, soft and nontender.Normal bowel sounds heard. Central nervous system: Alert and oriented. No focal neurological deficits. Extremities: Trace edema Skin: No rashes Psychiatry:  Mood & affect appropriate.     Data Reviewed:   CBC: Recent Labs  Lab 10/21/23 1602 10/22/23 0215  WBC 8.5 6.7  NEUTROABS 6.9  --   HGB 11.8* 11.1*  HCT 35.7* 33.2*  MCV 94.2 92.7  PLT 191 186   Basic Metabolic Panel: Recent Labs  Lab 10/21/23 1602 10/22/23 0215 10/23/23 0249  NA 132* 134* 133*  K 4.0 3.4* 2.9*  CL 98 100 94*  CO2 24 24 24   GLUCOSE 113* 111* 122*  BUN 13 12 14   CREATININE 0.84 0.79 0.97  CALCIUM  8.8* 8.7* 9.3   GFR: Estimated Creatinine Clearance: 76.3 mL/min (by C-G formula based on SCr of 0.97 mg/dL). Liver Function Tests: Recent Labs  Lab 10/21/23 1602 10/22/23 0215  AST 24 20  ALT 20 18  ALKPHOS 46 42  BILITOT 0.7  0.7  PROT 6.2* 5.7*  ALBUMIN 3.8 3.4*   No results for input(s): LIPASE, AMYLASE in the last 168 hours. No results for input(s): AMMONIA in the last 168 hours. Coagulation Profile: No results for input(s): INR, PROTIME in the last 168 hours. Cardiac Enzymes: No results for input(s): CKTOTAL, CKMB, CKMBINDEX, TROPONINI in the last 168 hours. BNP (last 3 results) No results for input(s): PROBNP in the last 8760 hours. HbA1C: Recent Labs    10/23/23 0249  HGBA1C 5.1   CBG: Recent Labs  Lab 10/22/23 1030 10/22/23 1653  10/22/23 2103 10/23/23 0850 10/23/23 1126  GLUCAP 116* 132* 117* 169* 133*   Lipid Profile: No results for input(s): CHOL, HDL, LDLCALC, TRIG, CHOLHDL, LDLDIRECT in the last 72 hours. Thyroid  Function Tests: No results for input(s): TSH, T4TOTAL, FREET4, T3FREE, THYROIDAB in the last 72 hours. Anemia Panel: No results for input(s): VITAMINB12, FOLATE, FERRITIN, TIBC, IRON, RETICCTPCT in the last 72 hours. Urine analysis:    Component Value Date/Time   COLORURINE COLORLESS (A) 08/14/2023 1328   APPEARANCEUR CLEAR 08/14/2023 1328   LABSPEC 1.005 08/14/2023 1328   PHURINE 7.5 08/14/2023 1328   GLUCOSEU NEGATIVE 08/14/2023 1328   HGBUR NEGATIVE 08/14/2023 1328   BILIRUBINUR NEGATIVE 08/14/2023 1328   KETONESUR NEGATIVE 08/14/2023 1328   PROTEINUR NEGATIVE 08/14/2023 1328   UROBILINOGEN 1.0 03/02/2010 2317   NITRITE NEGATIVE 08/14/2023 1328   LEUKOCYTESUR NEGATIVE 08/14/2023 1328   Sepsis Labs: @LABRCNTIP (procalcitonin:4,lacticidven:4)  ) Recent Results (from the past 240 hours)  Resp panel by RT-PCR (RSV, Flu A&B, Covid) Anterior Nasal Swab     Status: None   Collection Time: 10/21/23  4:02 PM   Specimen: Anterior Nasal Swab  Result Value Ref Range Status   SARS Coronavirus 2 by RT PCR NEGATIVE NEGATIVE Final   Influenza A by PCR NEGATIVE NEGATIVE Final   Influenza B by PCR NEGATIVE NEGATIVE Final    Comment: (NOTE) The Xpert Xpress SARS-CoV-2/FLU/RSV plus assay is intended as an aid in the diagnosis of influenza from Nasopharyngeal swab specimens and should not be used as a sole basis for treatment. Nasal washings and aspirates are unacceptable for Xpert Xpress SARS-CoV-2/FLU/RSV testing.  Fact Sheet for Patients: BloggerCourse.com  Fact Sheet for Healthcare Providers: SeriousBroker.it  This test is not yet approved or cleared by the [place]  FDA and has been authorized for  detection and/or diagnosis of SARS-CoV-2 by FDA under an Emergency Use Authorization (EUA). This EUA will remain in effect (meaning this test can be used) for the duration of the COVID-19 declaration under Section 564(b)(1) of the Act, 21 U.S.C. section 360bbb-3(b)(1), unless the authorization is terminated or revoked.     Resp Syncytial Virus by PCR NEGATIVE NEGATIVE Final    Comment: (NOTE) Fact Sheet for Patients: BloggerCourse.com  Fact Sheet for Healthcare Providers: SeriousBroker.it  This test is not yet approved or cleared by the [place]  FDA and has been authorized for detection and/or diagnosis of SARS-CoV-2 by FDA under an Emergency Use Authorization (EUA). This EUA will remain in effect (meaning this test can be used) for the duration of the COVID-19 declaration under Section 564(b)(1) of the Act, 21 U.S.C. section 360bbb-3(b)(1), unless the authorization is terminated or revoked.  Performed at [facility], [address], [place], [place] [address]      Radiology Studies: DG CHEST PORT 1 VIEW Result Date: 10/23/2023 CLINICAL DATA:  Hypoxia EXAM: PORTABLE CHEST 1 VIEW COMPARISON:  Prior chest x-ray 10/20/2021 FINDINGS: Interval resolution of pulmonary  edema compared to prior. Persistent mild chronic bronchitic changes. Cardiac and mediastinal contours are within normal limits. No pleural effusion or pneumothorax. No focal airspace infiltrate. No acute osseous abnormality. IMPRESSION: Interval resolution of mild pulmonary edema compared to 10/21/2023. Stable mild chronic bronchitic changes. No acute cardiopulmonary process. Electronically Signed   By: Wilkie Lent M.D.   On: 10/23/2023 09:27   ECHOCARDIOGRAM COMPLETE Result Date: 10/22/2023    ECHOCARDIOGRAM REPORT   Patient Name:   MARLEE TRENTMAN Date of Exam: 10/22/2023 Medical Rec #:  [id number]        Height:       70.0 in Accession #:    [id number]        Weight:       166.9 lb Date of Birth:  08/20/1955        BSA:          1.933 m Patient Age:    68 years         BP:           187/96 mmHg Patient Gender: M                HR:           61 bpm. Exam Location:  Inpatient Procedure: 2D Echo, Cardiac Doppler and Color Doppler (Both Spectral and Color            Flow Doppler were utilized during procedure). Indications:    CHF-Acute Diastolic I50.31  History:        Patient has prior history of Echocardiogram examinations, most                 recent 06/12/2023. CHF, CAD, CKD, stage 2, Arrythmias:RBBB,                 Signs/Symptoms:Chest Pain; Risk Factors:Hypertension and                 Dyslipidemia.  Sonographer:    Thea Norlander RCS Referring Phys: SUBRINA SUNDIL IMPRESSIONS  1. Left ventricular ejection fraction, by estimation, is 45 to 50%. The left ventricle has mildly decreased function. The left ventricle demonstrates regional wall motion abnormalities (see scoring diagram/findings for description). There is mild concentric left ventricular hypertrophy. Left ventricular diastolic parameters are consistent with Grade II diastolic dysfunction (pseudonormalization). There is moderate hypokinesis of the left ventricular, basal-mid inferior wall and inferolateral wall.  2. Right ventricular systolic function is normal. The right ventricular size is normal.  3. Left atrial size was mild to moderately dilated.  4. The mitral valve is normal in structure. Mild to moderate mitral valve regurgitation. No evidence of mitral stenosis.  5. The aortic valve is tricuspid. There is mild calcification of the aortic valve. There is mild thickening of the aortic valve. Aortic valve regurgitation is not visualized. Aortic valve sclerosis/calcification is present, without any evidence of aortic stenosis. Comparison(s): A prior study was performed on 06/12/2023. Prior images reviewed side by side. The left ventricular function is worsened. The left ventricular wall motion  abnormalities are worse. (there is more extensive involvement of the inferior wall) However, LV function is overall quite similar to the 2022 report. FINDINGS  Left Ventricle: Left ventricular ejection fraction, by estimation, is 45 to 50%. The left ventricle has mildly decreased function. The left ventricle demonstrates regional wall motion abnormalities. Moderate hypokinesis of the left ventricular, basal-mid inferior wall and inferolateral wall. The left ventricular internal cavity size was normal in size. There is mild concentric left  ventricular hypertrophy. Left ventricular diastolic parameters are consistent with Grade II diastolic dysfunction (pseudonormalization).  LV Wall Scoring: The inferior wall and posterior wall are hypokinetic. Right Ventricle: The right ventricular size is normal. No increase in right ventricular wall thickness. Right ventricular systolic function is normal. Left Atrium: Left atrial size was mild to moderately dilated. Right Atrium: Right atrial size was normal in size. Pericardium: There is no evidence of pericardial effusion. Mitral Valve: The mitral valve is normal in structure. Mild to moderate mitral valve regurgitation. No evidence of mitral valve stenosis. Tricuspid Valve: The tricuspid valve is normal in structure. Tricuspid valve regurgitation is not demonstrated. Aortic Valve: The aortic valve is tricuspid. There is mild calcification of the aortic valve. There is mild thickening of the aortic valve. Aortic valve regurgitation is not visualized. Aortic valve sclerosis/calcification is present, without any evidence of aortic stenosis. Aortic valve mean gradient measures 3.9 mmHg. Aortic valve peak gradient measures 8.0 mmHg. Aortic valve area, by VTI measures 3.32 cm. Pulmonic Valve: The pulmonic valve was grossly normal. Pulmonic valve regurgitation is trivial. No evidence of pulmonic stenosis. Aorta: The aortic root and ascending aorta are structurally normal, with no  evidence of dilitation. IAS/Shunts: No atrial level shunt detected by color flow Doppler.  LEFT VENTRICLE PLAX 2D LVIDd:         4.60 cm   Diastology LVIDs:         3.40 cm   LV e' medial:    7.29 cm/s LV PW:         1.20 cm   LV E/e' medial:  13.9 LV IVS:        1.20 cm   LV e' lateral:   8.27 cm/s LVOT diam:     2.09 cm   LV E/e' lateral: 12.2 LV SV:         86 LV SV Index:   45 LVOT Area:     3.43 cm  RIGHT VENTRICLE             IVC RV S prime:     12.80 cm/s  IVC diam: 2.70 cm TAPSE (M-mode): 2.0 cm LEFT ATRIUM             Index        RIGHT ATRIUM           Index LA diam:        3.80 cm 1.97 cm/m   RA Area:     16.40 cm LA Vol (A2C):   67.3 ml 34.82 ml/m  RA Volume:   44.30 ml  22.92 ml/m LA Vol (A4C):   55.5 ml 28.72 ml/m LA Biplane Vol: 61.0 ml 31.56 ml/m  AORTIC VALVE AV Area (Vmax):    2.99 cm AV Area (Vmean):   3.16 cm AV Area (VTI):     3.32 cm AV Vmax:           141.17 cm/s AV Vmean:          90.871 cm/s AV VTI:            0.259 m AV Peak Grad:      8.0 mmHg AV Mean Grad:      3.9 mmHg LVOT Vmax:         123.15 cm/s LVOT Vmean:        83.828 cm/s LVOT VTI:          0.251 m LVOT/AV VTI ratio: 0.97  AORTA Ao Root diam: 3.50 cm Ao Asc diam:  3.40 cm MITRAL VALVE                TRICUSPID VALVE MV Area (PHT): 3.77 cm     TR Peak grad:   35721.0 mmHg MV Decel Time: 201 msec     TR Vmax:        9450.00 cm/s MV E velocity: 101.00 cm/s MV A velocity: 53.40 cm/s   SHUNTS MV E/A ratio:  1.89         Systemic VTI:  0.25 m                             Systemic Diam: 2.09 cm Jerel Croitoru MD Electronically signed by Jerel Balding MD Signature Date/Time: 10/22/2023/2:56:55 PM    Final    CT Angio Chest PE W and/or Wo Contrast Result Date: 10/21/2023 CLINICAL DATA:  Pulmonary embolism (PE) suspected, low to intermediate prob, neg D-dimer. Shortness of breath EXAM: CT ANGIOGRAPHY CHEST WITH CONTRAST TECHNIQUE: Multidetector CT imaging of the chest was performed using the standard protocol during bolus  administration of intravenous contrast. Multiplanar CT image reconstructions and MIPs were obtained to evaluate the vascular anatomy. RADIATION DOSE REDUCTION: This exam was performed according to the departmental dose-optimization program which includes automated exposure control, adjustment of the mA and/or kV according to patient size and/or use of iterative reconstruction technique. CONTRAST:  75mL OMNIPAQUE  IOHEXOL  350 MG/ML SOLN COMPARISON:  02/09/2023 FINDINGS: Cardiovascular: Heart is normal size. Aorta is normal caliber. Moderate coronary artery and aortic atherosclerosis. No filling defects in the pulmonary arteries to suggest pulmonary emboli. Mediastinum/Nodes: No mediastinal, hilar, or axillary adenopathy. Trachea and esophagus are unremarkable. Thyroid  unremarkable. Lungs/Pleura: Moderate to large bilateral pleural effusions. Mild vascular congestion. Interstitial prominence and ground-glass perihilar opacities suggest edema. Upper Abdomen: No acute findings Musculoskeletal: Chest wall soft tissues are unremarkable. No acute bony abnormality. Review of the MIP images confirms the above findings. IMPRESSION: No evidence of pulmonary embolus. Moderate to large bilateral pleural effusions. Suspect mild interstitial edema. Coronary artery disease. Aortic Atherosclerosis (ICD10-I70.0). Electronically Signed   By: Franky Crease M.D.   On: 10/21/2023 19:48   DG Chest 2 View Result Date: 10/21/2023 CLINICAL DATA:  Shortness of breath. EXAM: CHEST - 2 VIEW COMPARISON:  August 31, 2023 FINDINGS: The heart size and mediastinal contours are within normal limits. Moderate severity diffusely increased interstitial lung markings are seen. Mild atelectatic changes are noted within the bilateral lung bases. No pleural effusion or pneumothorax is identified. The visualized skeletal structures are unremarkable. IMPRESSION: Moderate severity interstitial edema with mild bibasilar atelectasis. Electronically Signed    By: Suzen Dials M.D.   On: 10/21/2023 16:45     Scheduled Meds:  aspirin  EC  81 mg Oral Daily   atorvastatin   80 mg Oral Daily   carvedilol   25 mg Oral BID WC   enoxaparin  (LOVENOX ) injection  40 mg Subcutaneous Q24H   fluticasone  1 spray Each Nare Daily   furosemide   40 mg Intravenous Q12H   insulin  aspart  0-5 Units Subcutaneous QHS   insulin  aspart  0-6 Units Subcutaneous TID WC   loratadine  10 mg Oral Daily   ondansetron   8 mg Oral TID   polyethylene glycol  17 g Oral Daily   potassium chloride   40 mEq Oral TID   sacubitril-valsartan  1 tablet Oral BID   sodium chloride  flush  3 mL Intravenous Q12H   sodium chloride  flush  3 mL  Intravenous Q12H   spironolactone  12.5 mg Oral Daily   Continuous Infusions:  ondansetron  (ZOFRAN ) IV 8 mg (10/23/23 0946)   promethazine (PHENERGAN) injection (IM or IVPB)       LOS: 2 days    Time spent:    Sigurd Pac, MD [facility]   10/23/2023, 11:49 AM

## 2023-10-23 NOTE — Progress Notes (Signed)
 Rounding Note   Patient Name: Randall [name] Date of Encounter: 10/23/2023  [facility] Cardiologist: Alm Clay, MD   Subjective  Feels much better from a fluid standpoint. He has diuresed well. He has not been getting home dose of zofran , will increase this. He was vomiting much of yesterday and could not keep meds down. Will titrate to improve CHF medications.  Scheduled Meds:  aspirin  EC  81 mg Oral Daily   atorvastatin   80 mg Oral Daily   carvedilol   25 mg Oral BID WC   enoxaparin  (LOVENOX ) injection  40 mg Subcutaneous Q24H   fluticasone  1 spray Each Nare Daily   furosemide   40 mg Intravenous Q8H   insulin  aspart  0-5 Units Subcutaneous QHS   insulin  aspart  0-6 Units Subcutaneous TID WC   irbesartan   75 mg Oral Daily   loratadine  10 mg Oral Daily   ondansetron   8 mg Oral TID   polyethylene glycol  17 g Oral Daily   potassium chloride   40 mEq Oral TID   sodium chloride  flush  3 mL Intravenous Q12H   sodium chloride  flush  3 mL Intravenous Q12H   spironolactone  25 mg Oral Daily   Continuous Infusions:  ondansetron  (ZOFRAN ) IV Stopped (10/22/23 2213)   promethazine (PHENERGAN) injection (IM or IVPB)     PRN Meds: acetaminophen  **OR** acetaminophen , fluticasone, hydrALAZINE , HYDROcodone -acetaminophen , promethazine (PHENERGAN) injection (IM or IVPB), senna-docusate, sodium chloride  flush   Vital Signs  Vitals:   10/22/23 1900 10/22/23 1954 10/23/23 0425 10/23/23 0725  BP: (!) 163/101 (!) 168/67 (!) 163/83 (!) 167/85  Pulse: 72  79 71  Resp:  15 15 18   Temp:  98 F (36.7 C) 98.2 F (36.8 C) 98.7 F (37.1 C)  TempSrc:  Oral Oral Oral  SpO2: 95% 98% 98% 100%  Weight:      Height:        Intake/Output Summary (Last 24 hours) at 10/23/2023 0802 Last data filed at 10/23/2023 0600 Gross per 24 hour  Intake 823.58 ml  Output 4675 ml  Net -3851.42 ml      10/21/2023   10:37 PM 10/21/2023    3:45 PM 09/26/2023    3:08 PM  Last 3 Weights   Weight (lbs) 166 lb 14.2 oz 141 lb 15.6 oz 142 lb  Weight (kg) 75.7 kg 64.4 kg 64.411 kg      Telemetry SR in the 60s, with bout of ST, question if this was related to his vomiting - Personally Reviewed  ECG  No new tracings - Personally Reviewed  Physical Exam  GEN: No acute distress.   Neck: No JVD Cardiac: RRR, no murmurs, rubs, or gallops.  Respiratory: Clear to auscultation bilaterally. GI: Soft, nontender, non-distended  MS: No edema; No deformity. Neuro:  Nonfocal  Psych: Normal affect   Labs High Sensitivity Troponin:   Recent Labs  Lab 10/21/23 1553 10/21/23 1753  TROPONINIHS 14 18*     Chemistry Recent Labs  Lab 10/21/23 1602 10/22/23 0215 10/23/23 0249  NA 132* 134* 133*  K 4.0 3.4* 2.9*  CL 98 100 94*  CO2 24 24 24   GLUCOSE 113* 111* 122*  BUN 13 12 14   CREATININE 0.84 0.79 0.97  CALCIUM  8.8* 8.7* 9.3  PROT 6.2* 5.7*  --   ALBUMIN 3.8 3.4*  --   AST 24 20  --   ALT 20 18  --   ALKPHOS 46 42  --   BILITOT 0.7  0.7  --   GFRNONAA >60 >60 >60  ANIONGAP 10 10 15     Lipids No results for input(s): CHOL, TRIG, HDL, LABVLDL, LDLCALC, CHOLHDL in the last 168 hours.  Hematology Recent Labs  Lab 10/21/23 1602 10/22/23 0215  WBC 8.5 6.7  RBC 3.79* 3.58*  HGB 11.8* 11.1*  HCT 35.7* 33.2*  MCV 94.2 92.7  MCH 31.1 31.0  MCHC 33.1 33.4  RDW 13.3 13.3  PLT 191 186   Thyroid  No results for input(s): TSH, FREET4 in the last 168 hours.  BNP Recent Labs  Lab 10/21/23 1554  BNP 681.3*    DDimer No results for input(s): DDIMER in the last 168 hours.   Radiology  ECHOCARDIOGRAM COMPLETE Result Date: 10/22/2023    ECHOCARDIOGRAM REPORT   Patient Name:   BRUIN BOLGER Date of Exam: 10/22/2023 Medical Rec #:  [id number]        Height:       70.0 in Accession #:    [id number]       Weight:       166.9 lb Date of Birth:  [birth year]        BSA:          1.933 m Patient Age:    68 years         BP:           187/96 mmHg Patient  Gender: M                HR:           61 bpm. Exam Location:  Inpatient Procedure: 2D Echo, Cardiac Doppler and Color Doppler (Both Spectral and Color            Flow Doppler were utilized during procedure). Indications:    CHF-Acute Diastolic I50.31  History:        Patient has prior history of Echocardiogram examinations, most                 recent 06/12/2023. CHF, CAD, CKD, stage 2, Arrythmias:RBBB,                 Signs/Symptoms:Chest Pain; Risk Factors:Hypertension and                 Dyslipidemia.  Sonographer:    Thea Norlander RCS Referring Phys: SUBRINA SUNDIL IMPRESSIONS  1. Left ventricular ejection fraction, by estimation, is 45 to 50%. The left ventricle has mildly decreased function. The left ventricle demonstrates regional wall motion abnormalities (see scoring diagram/findings for description). There is mild concentric left ventricular hypertrophy. Left ventricular diastolic parameters are consistent with Grade II diastolic dysfunction (pseudonormalization). There is moderate hypokinesis of the left ventricular, basal-mid inferior wall and inferolateral wall.  2. Right ventricular systolic function is normal. The right ventricular size is normal.  3. Left atrial size was mild to moderately dilated.  4. The mitral valve is normal in structure. Mild to moderate mitral valve regurgitation. No evidence of mitral stenosis.  5. The aortic valve is tricuspid. There is mild calcification of the aortic valve. There is mild thickening of the aortic valve. Aortic valve regurgitation is not visualized. Aortic valve sclerosis/calcification is present, without any evidence of aortic stenosis. Comparison(s): A prior study was performed on 06/12/2023. Prior images reviewed side by side. The left ventricular function is worsened. The left ventricular wall motion abnormalities are worse. (there is more extensive involvement of the inferior wall) However, LV function is overall quite similar to the 2022 report.  FINDINGS  Left Ventricle: Left ventricular ejection fraction, by estimation, is 45 to 50%. The left ventricle has mildly decreased function. The left ventricle demonstrates regional wall motion abnormalities. Moderate hypokinesis of the left ventricular, basal-mid inferior wall and inferolateral wall. The left ventricular internal cavity size was normal in size. There is mild concentric left ventricular hypertrophy. Left ventricular diastolic parameters are consistent with Grade II diastolic dysfunction (pseudonormalization).  LV Wall Scoring: The inferior wall and posterior wall are hypokinetic. Right Ventricle: The right ventricular size is normal. No increase in right ventricular wall thickness. Right ventricular systolic function is normal. Left Atrium: Left atrial size was mild to moderately dilated. Right Atrium: Right atrial size was normal in size. Pericardium: There is no evidence of pericardial effusion. Mitral Valve: The mitral valve is normal in structure. Mild to moderate mitral valve regurgitation. No evidence of mitral valve stenosis. Tricuspid Valve: The tricuspid valve is normal in structure. Tricuspid valve regurgitation is not demonstrated. Aortic Valve: The aortic valve is tricuspid. There is mild calcification of the aortic valve. There is mild thickening of the aortic valve. Aortic valve regurgitation is not visualized. Aortic valve sclerosis/calcification is present, without any evidence of aortic stenosis. Aortic valve mean gradient measures 3.9 mmHg. Aortic valve peak gradient measures 8.0 mmHg. Aortic valve area, by VTI measures 3.32 cm. Pulmonic Valve: The pulmonic valve was grossly normal. Pulmonic valve regurgitation is trivial. No evidence of pulmonic stenosis. Aorta: The aortic root and ascending aorta are structurally normal, with no evidence of dilitation. IAS/Shunts: No atrial level shunt detected by color flow Doppler.  LEFT VENTRICLE PLAX 2D LVIDd:         4.60 cm   Diastology  LVIDs:         3.40 cm   LV e' medial:    7.29 cm/s LV PW:         1.20 cm   LV E/e' medial:  13.9 LV IVS:        1.20 cm   LV e' lateral:   8.27 cm/s LVOT diam:     2.09 cm   LV E/e' lateral: 12.2 LV SV:         86 LV SV Index:   45 LVOT Area:     3.43 cm  RIGHT VENTRICLE             IVC RV S prime:     12.80 cm/s  IVC diam: 2.70 cm TAPSE (M-mode): 2.0 cm LEFT ATRIUM             Index        RIGHT ATRIUM           Index LA diam:        3.80 cm 1.97 cm/m   RA Area:     16.40 cm LA Vol (A2C):   67.3 ml 34.82 ml/m  RA Volume:   44.30 ml  22.92 ml/m LA Vol (A4C):   55.5 ml 28.72 ml/m LA Biplane Vol: 61.0 ml 31.56 ml/m  AORTIC VALVE AV Area (Vmax):    2.99 cm AV Area (Vmean):   3.16 cm AV Area (VTI):     3.32 cm AV Vmax:           141.17 cm/s AV Vmean:          90.871 cm/s AV VTI:            0.259 m AV Peak Grad:      8.0 mmHg AV Mean Grad:  3.9 mmHg LVOT Vmax:         123.15 cm/s LVOT Vmean:        83.828 cm/s LVOT VTI:          0.251 m LVOT/AV VTI ratio: 0.97  AORTA Ao Root diam: 3.50 cm Ao Asc diam:  3.40 cm MITRAL VALVE                TRICUSPID VALVE MV Area (PHT): 3.77 cm     TR Peak grad:   35721.0 mmHg MV Decel Time: 201 msec     TR Vmax:        9450.00 cm/s MV E velocity: 101.00 cm/s MV A velocity: 53.40 cm/s   SHUNTS MV E/A ratio:  1.89         Systemic VTI:  0.25 m                             Systemic Diam: 2.09 cm Jerel Croitoru MD Electronically signed by Jerel Balding MD Signature Date/Time: 10/22/2023/2:56:55 PM    Final    CT Angio Chest PE W and/or Wo Contrast Result Date: 10/21/2023 CLINICAL DATA:  Pulmonary embolism (PE) suspected, low to intermediate prob, neg D-dimer. Shortness of breath EXAM: CT ANGIOGRAPHY CHEST WITH CONTRAST TECHNIQUE: Multidetector CT imaging of the chest was performed using the standard protocol during bolus administration of intravenous contrast. Multiplanar CT image reconstructions and MIPs were obtained to evaluate the vascular anatomy. RADIATION DOSE  REDUCTION: This exam was performed according to the departmental dose-optimization program which includes automated exposure control, adjustment of the mA and/or kV according to patient size and/or use of iterative reconstruction technique. CONTRAST:  75mL OMNIPAQUE  IOHEXOL  350 MG/ML SOLN COMPARISON:  02/09/2023 FINDINGS: Cardiovascular: Heart is normal size. Aorta is normal caliber. Moderate coronary artery and aortic atherosclerosis. No filling defects in the pulmonary arteries to suggest pulmonary emboli. Mediastinum/Nodes: No mediastinal, hilar, or axillary adenopathy. Trachea and esophagus are unremarkable. Thyroid  unremarkable. Lungs/Pleura: Moderate to large bilateral pleural effusions. Mild vascular congestion. Interstitial prominence and ground-glass perihilar opacities suggest edema. Upper Abdomen: No acute findings Musculoskeletal: Chest wall soft tissues are unremarkable. No acute bony abnormality. Review of the MIP images confirms the above findings. IMPRESSION: No evidence of pulmonary embolus. Moderate to large bilateral pleural effusions. Suspect mild interstitial edema. Coronary artery disease. Aortic Atherosclerosis (ICD10-I70.0). Electronically Signed   By: Franky Crease M.D.   On: 10/21/2023 19:48   DG Chest 2 View Result Date: 10/21/2023 CLINICAL DATA:  Shortness of breath. EXAM: CHEST - 2 VIEW COMPARISON:  August 31, 2023 FINDINGS: The heart size and mediastinal contours are within normal limits. Moderate severity diffusely increased interstitial lung markings are seen. Mild atelectatic changes are noted within the bilateral lung bases. No pleural effusion or pneumothorax is identified. The visualized skeletal structures are unremarkable. IMPRESSION: Moderate severity interstitial edema with mild bibasilar atelectasis. Electronically Signed   By: Suzen Dials M.D.   On: 10/21/2023 16:45    Cardiac Studies  Echo 10/22/23:  1. Left ventricular ejection fraction, by estimation, is 45  to 50%. The  left ventricle has mildly decreased function. The left ventricle  demonstrates regional wall motion abnormalities (see scoring  diagram/findings for description). There is mild  concentric left ventricular hypertrophy. Left ventricular diastolic  parameters are consistent with Grade II diastolic dysfunction  (pseudonormalization). There is moderate hypokinesis of the left  ventricular, basal-mid inferior wall and inferolateral  wall.   2.  Right ventricular systolic function is normal. The right ventricular  size is normal.   3. Left atrial size was mild to moderately dilated.   4. The mitral valve is normal in structure. Mild to moderate mitral valve  regurgitation. No evidence of mitral stenosis.   5. The aortic valve is tricuspid. There is mild calcification of the  aortic valve. There is mild thickening of the aortic valve. Aortic valve  regurgitation is not visualized. Aortic valve sclerosis/calcification is  present, without any evidence of  aortic stenosis.    Patient Profile   68 y.o. male with a hx of hypertension, hyperlipidemia, DM2, right bundle branch block, laryngeal cancer and history of diastolic heart failure who is being seen for the evaluation of CHF   Assessment & Plan   Acute on chronic systolic and diastolic heart failure Lymphedema  Moderate to large bilateral pleural effusion - echo this admission with LVEF 45-50%, grade 2 DD,  - hyponatremia on chlorthalidone  - uses PRN along with PRN lasix  - interstitial edema on CT chest with B pleural effusions - diuresing on 40 mg IV lasix  q8hr - 5L out yesterday with unmeasured occurrences - remains on coreg , irbesartan , and now on spironolactone - both entresto and jardiance are $0 - will transition irbesartan  to 24-26 mg entresto BID - reduce spironolactone to 12.5 mg daily - reduce lasix  to 40 mg IV BID - reduce potassium supplement to BID - he takes 10 mEq BID at home - continue coreg  25 mg BID -  home dose - agree with stopping amlodipine  - CXR this morning appears improved   Hypertension - adjusting regimen for better CHF GDMT   Hyperlipidemia with LDL goal < 70 06/11/2023: Cholesterol 103; HDL 43; LDL Cholesterol 54; Triglycerides 32; VLDL 6 - continue lipitor 80 mg   CAD - nonobstructive disease on heart cath - continue ASA and lipitor - no chest pain     For questions or updates, please contact [facility] Please consult [email or web address] for contact info under       Signed, Jon Nat Hails, PA  10/23/2023, 8:02 AM

## 2023-10-23 NOTE — Telephone Encounter (Signed)
 Patient Product/process development scientist completed.    The patient is insured through [employer]. Patient has Medicare and is not eligible for a copay card, but may be able to apply for patient assistance or Medicare RX Payment Plan (Patient Must reach out to their plan, if eligible for payment plan), if available.    Ran test claim for Entresto 24-26 mg and the current 30 day co-pay is $0.00.  Ran test claim for Jardiance 10 mg and the current 30 day co-pay is $0.00.  This test claim was processed through [facility]- copay amounts may vary at other pharmacies due to pharmacy/plan contracts, or as the patient moves through the different stages of their insurance plan.     Reyes Sharps, CPHT Pharmacy Technician Patient Advocate Specialist Lead [facility] Patient Advocate Team Direct Number: [phone]  Fax: [phone]

## 2023-10-24 ENCOUNTER — Other Ambulatory Visit (HOSPITAL_COMMUNITY): Payer: Self-pay

## 2023-10-24 DIAGNOSIS — I5041 Acute combined systolic (congestive) and diastolic (congestive) heart failure: Secondary | ICD-10-CM | POA: Diagnosis not present

## 2023-10-24 DIAGNOSIS — I5043 Acute on chronic combined systolic (congestive) and diastolic (congestive) heart failure: Secondary | ICD-10-CM | POA: Diagnosis not present

## 2023-10-24 LAB — BASIC METABOLIC PANEL WITH GFR
Anion gap: 11 (ref 5–15)
BUN: 17 mg/dL (ref 8–23)
CO2: 26 mmol/L (ref 22–32)
Calcium: 9.5 mg/dL (ref 8.9–10.3)
Chloride: 96 mmol/L — ABNORMAL LOW (ref 98–111)
Creatinine, Ser: 0.89 mg/dL (ref 0.61–1.24)
GFR, Estimated: >60 mL/min (ref >60)
Glucose, Bld: 158 mg/dL — ABNORMAL HIGH (ref 70–99)
Potassium: 3.9 mmol/L (ref 3.5–5.1)
Sodium: 133 mmol/L — ABNORMAL LOW (ref 135–145)

## 2023-10-24 LAB — MAGNESIUM: Magnesium: 2 mg/dL (ref 1.7–2.4)

## 2023-10-24 LAB — GLUCOSE, CAPILLARY: Glucose-Capillary: 107 mg/dL — ABNORMAL HIGH (ref 70–99)

## 2023-10-24 MED ORDER — SACUBITRIL-VALSARTAN 49-51 MG PO TABS
1.0000 | ORAL_TABLET | Freq: Two times a day (BID) | ORAL | 1 refills | Status: DC
  Filled 2023-10-24: qty 60, 30d supply, fill #0

## 2023-10-24 MED ORDER — FUROSEMIDE 40 MG PO TABS
40.0000 mg | ORAL_TABLET | Freq: Every day | ORAL | 1 refills | Status: DC
  Filled 2023-10-24: qty 30, 30d supply, fill #0

## 2023-10-24 MED ORDER — SPIRONOLACTONE 25 MG PO TABS: 25.0000 mg | ORAL_TABLET | Freq: Every day | ORAL | Status: DC

## 2023-10-24 MED ORDER — FUROSEMIDE 40 MG PO TABS
40.0000 mg | ORAL_TABLET | Freq: Every day | ORAL | Status: DC
  Administered 2023-10-24: 40 mg via ORAL
  Filled 2023-10-24: qty 1

## 2023-10-24 MED ORDER — SPIRONOLACTONE 12.5 MG HALF TABLET
12.5000 mg | ORAL_TABLET | Freq: Once | ORAL | Status: DC
  Filled 2023-10-24: qty 1

## 2023-10-24 MED ORDER — SACUBITRIL-VALSARTAN 49-51 MG PO TABS: 1.0000 | ORAL_TABLET | Freq: Two times a day (BID) | ORAL | Status: DC

## 2023-10-24 MED ORDER — SPIRONOLACTONE 25 MG PO TABS
25.0000 mg | ORAL_TABLET | Freq: Every day | ORAL | 1 refills | Status: DC
  Filled 2023-10-24: qty 30, 30d supply, fill #0

## 2023-10-24 MED ORDER — FLUTICASONE PROPIONATE 50 MCG/ACT NA SUSP
1.0000 | Freq: Every day | NASAL | 0 refills | Status: AC
  Filled 2023-10-24: qty 16, 30d supply, fill #0

## 2023-10-24 NOTE — TOC Transition Note (Addendum)
 Transition of Care [facility]) - Discharge Note   Patient Details  Name: Randall [name] MRN: [id number] Date of Birth: [birth year]  [facility] CM/SW Contact:  Waddell Barnie Rama, RN Phone Number: 10/24/2023, 10:08 AM   Clinical Narrative:    For dc today, wife will transport home, he states he does not need HH services or any DME.  He has a walker, shower chair and bsc. Entresto hs zero dollar copay.   Final next level of care: Home/Self Care Barriers to Discharge: No Barriers Identified   Patient Goals and CMS Choice Patient states their goals for this hospitalization and ongoing recovery are:: return home with wife   Choice offered to / list presented to : NA      Discharge Placement                       Discharge Plan and Services Additional resources added to the After Visit Summary for   In-house Referral: NA Discharge Planning Services: CM Consult Post Acute Care Choice: NA          DME Arranged: N/A DME Agency: NA       HH Arranged: NA          Social Drivers of Health (SDOH) Interventions SDOH Screenings   Food Insecurity: No Food Insecurity (10/21/2023)  Housing: Low Risk  (10/21/2023)  Transportation Needs: No Transportation Needs (10/21/2023)  Utilities: Not At Risk (10/21/2023)  Alcohol Screen: Low Risk  (10/23/2023)  Depression (PHQ2-9): Low Risk  (07/01/2022)  Financial Resource Strain: Low Risk  (10/23/2023)  Physical Activity: Sufficiently Active (08/14/2023)   Received [facility]  Social Connections: Socially Isolated (10/21/2023)  Stress: No Stress Concern Present (08/14/2023)   Received from [facility]  Tobacco Use: Low Risk  (10/21/2023)  Health Literacy: Adequate Health Literacy (09/21/2022)     Readmission Risk Interventions    10/24/2023   10:03 AM  Readmission Risk Prevention Plan  Transportation Screening Complete  PCP or Specialist Appt within 3-5 Days Complete  HRI or Home Care Consult  Complete  Palliative Care Screening Not Applicable  Medication Review (RN Care Manager) Complete

## 2023-10-24 NOTE — Care Management Important Message (Signed)
 Important Message  Patient Details  Name: DEMONT LINFORD MRN: [id number] Date of Birth: [birth year]   Important Message Given:  Yes - Medicare IM     Vonzell Arrie Sharps 10/24/2023, 10:23 AM

## 2023-10-24 NOTE — Discharge Summary (Signed)
 Physician Discharge Summary  Randall [name] FMW:[id number] DOB: [birth year] DOA: 10/21/2023  PCP: Lynwood Laneta ORN, PA-C  Admit date: 10/21/2023 Discharge date: 10/24/2023  Time spent: 45 minutes  Recommendations for Outpatient Follow-up:  CHF TOC clinic on 10/22, please check BMP at follow-up   Discharge Diagnoses:  Principal Problem:   Acute on chronic combined CHF   Acute hypoxic respiratory failure (HCC)   Non-insulin  dependent type 2 diabetes mellitus (HCC)   History of CAD (coronary artery disease)   Chronic acquired lymphedema   History of laryngeal cancer   Elevated troponin   First degree AV block   Discharge Condition: Improved  Diet recommendation: Low-sodium, heart healthy  Filed Weights   10/21/23 1545 10/21/23 2237 10/24/23 0333  Weight: 64.4 kg 75.7 kg 69.4 kg    History of present illness:  68/M with history of CAD, hypertension, diastolic CHF, lymphedema, type 2 diabetes mellitus, 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 - In the ED volume overloaded, hypoxic, started on diuretics  Hospital Course:   Acute hypoxic respiratory failure Acute on chronic combined CHF Hypertensive urgency Bilateral pleural effusions -Last echo 6/25 with EF 55-60%, grade 2 DD, normal RV -Repeat echo with EF down to 45-50%, grade 2 DD, normal RV, wall motion abnormality -Recent cath 6/25 with moderate single-vessel disease - Improving with diuresis, he is 9 L negative, followed by cardiology -Transition to Entresto, started on Aldactone, continued Coreg , Lasix  dose added at discharge - Repeat x-ray is improved, does not need thoracentesis -Weaned off O2, discharged home in a stable condition, has a follow-up in [facility] on 10/22   Type 2 diabetes mellitus - Stable, A1c is 5.1    History of CAD -Moderate single-vessel disease with 60% RPDA stenosis and a codominant system noted on cath 6/25 - Continue aspirin , Coreg  and statin   Hypokalemia Replete   Head and neck CA, 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   Chronic pain and narcotic dependence - Continue current regimen   Discharge Exam: Vitals:   10/24/23 0333 10/24/23 0720  BP: 132/67 (!) 154/84  Pulse: 68 (!) 59  Resp: 18 18  Temp: 98.5 F (36.9 C) 97.9 F (36.6 C)  SpO2: 97% 97%   Gen: Awake, Alert, Oriented X 3,  HEENT: no JVD Lungs: Good air movement bilaterally, CTAB CVS: S1S2/RRR Abd: soft, Non tender, non distended, BS present Extremities: No edema Skin: no new rashes on exposed skin   Discharge Instructions   Discharge Instructions     Diet - low sodium heart healthy   Complete by: As directed    Increase activity slowly   Complete by: As directed       Allergies as of 10/24/2023       Reactions   Norgesic Forte [orphenadrine-aspirin -caffeine] Other (See Comments)   Unknown reaction    Latex Hives, Rash   Blisters        Medication List     STOP taking these medications    amLODipine  10 MG tablet Commonly known as: NORVASC    chlorthalidone  25 MG tablet Commonly known as: HYGROTON    meloxicam  15 MG tablet Commonly known as: MOBIC    potassium chloride  10 MEQ CR capsule Commonly known as: MICRO-K    potassium chloride  SA 20 MEQ tablet Commonly known as:  KLOR-CON  M   telmisartan 80 MG tablet Commonly known as: MICARDIS       TAKE these medications    aspirin  EC 81 MG tablet Take 81 mg by mouth daily. Swallow whole.   atorvastatin  80 MG tablet Commonly known as: LIPITOR Take 1 tablet (80 mg total) by mouth daily.   carvedilol  25 MG tablet Commonly known as: COREG  Take 25 mg by mouth in the morning and at bedtime.   chlorhexidine  0.12 % solution Commonly known as: PERIDEX  Use as directed 10 mLs in the mouth or  throat 2 (two) times daily.   docusate sodium  100 MG capsule Commonly known as: COLACE Take 100 mg by mouth in the morning, at noon, and at bedtime.   fluticasone 50 MCG/ACT nasal spray Commonly known as: FLONASE Place 1 spray into both nostrils daily for 3 days. Start taking on: October 25, 2023   FreeStyle Libre 3 Plus Sensor Misc REPLACE SENSOR EVERY 2 WEEKS - DIAGNOSIS TYPE 2 DM   furosemide  40 MG tablet Commonly known as: Lasix  Take 1 tablet (40 mg total) by mouth daily. What changed:  medication strength how much to take when to take this reasons to take this   geriatric multivitamins-minerals Liqd Take 15 mLs by mouth daily.   HYDROcodone -acetaminophen  7.5-325 mg/15 ml solution Commonly known as: HYCET Take 10 mL up to TID as needed for pain - not to exceed 30mL daily.   levocetirizine 5 MG tablet Commonly known as: Xyzal Allergy 24HR Take 1 tablet (5 mg total) by mouth every evening.   loperamide  2 MG tablet Commonly known as: IMODIUM  A-D Take 2 mg by mouth in the morning, at noon, and at bedtime.   LORazepam  0.5 MG tablet Commonly known as: ATIVAN  TAKE 1 TABLET (0.5 MG TOTAL) BY MOUTH EVERY 8 (EIGHT) HOURS. TAKE AS NEEDED FOR AVERSION TO FEEDINGS   magic mouthwash Soln Take 10 mLs by mouth 4 (four) times daily. Suspension contains equal amounts of Maalox Extra Strength, nystatin, and diphenhydramine .   MULTIVITAMIN PO Take 1 tablet by mouth daily.   ondansetron  8 MG tablet Commonly known as: ZOFRAN  Take 8 mg by mouth in the morning, at noon, and at bedtime.   OVER THE COUNTER MEDICATION Take 30 mLs by mouth every 6 (six) hours as needed (Pain). PainQuil   OVER THE COUNTER MEDICATION Take 2 tablets by mouth in the morning and at bedtime. [name]   pantoprazole  40 MG tablet Commonly known as: PROTONIX  Take 40 mg by mouth daily.   polyethylene glycol 17 g packet Commonly known as: MIRALAX / GLYCOLAX Take 17 g by mouth daily as needed for  moderate constipation.   PREVIDENT 5000 ENAMEL PROTECT DT Place 1 application  onto teeth in the morning and at bedtime.   sacubitril-valsartan 49-51 MG Commonly known as: ENTRESTO Take 1 tablet by mouth 2 (two) times daily.   solifenacin 10 MG tablet Commonly known as: VESICARE Take 10 mg by mouth daily.   spironolactone 25 MG tablet Commonly known as: ALDACTONE Take 1 tablet (25 mg total) by mouth daily. Start taking on: October 25, 2023   tadalafil 20 MG tablet Commonly known as: CIALIS Take 20 mg by mouth daily as needed for erectile dysfunction. What changed: Another medication with the same name was removed. Continue taking this medication, and follow the directions you see here.   TRULICITY [place] Inject 0.5 mg into the skin once a week. Thursday       Allergies  Allergen  Reactions   Norgesic Forte [Orphenadrine-Aspirin -Caffeine] Other (See Comments)    Unknown reaction    Latex Hives and Rash    Blisters    Follow-up Information     [facility]. Go in 7 day(s).   Specialty: Cardiology Why: Hospital follow up 11/01/2023 @ 9 am PLEASE bring a current medication list to appointment FREE valet parking, Entrance C, off [employer] [facility] and [facility] entrance Contact information: [address] [place]  [phone]                 The results of significant diagnostics from this hospitalization (including imaging, microbiology, ancillary and laboratory) are listed below for reference.    Significant Diagnostic Studies: DG CHEST PORT 1 VIEW Result Date: 10/23/2023 CLINICAL DATA:  Hypoxia EXAM: PORTABLE CHEST 1 VIEW COMPARISON:  Prior chest x-ray 10/20/2021 FINDINGS: Interval resolution of pulmonary edema compared to prior. Persistent mild chronic bronchitic changes. Cardiac and mediastinal contours are within normal limits. No pleural effusion or pneumothorax.  No focal airspace infiltrate. No acute osseous abnormality. IMPRESSION: Interval resolution of mild pulmonary edema compared to 10/21/2023. Stable mild chronic bronchitic changes. No acute cardiopulmonary process. Electronically Signed   By: Wilkie Lent M.D.   On: 10/23/2023 09:27   ECHOCARDIOGRAM COMPLETE Result Date: 10/22/2023    ECHOCARDIOGRAM REPORT   Patient Name:   Randall [name] Date of Exam: 10/22/2023 Medical Rec #:  [id number]        Height:       70.0 in Accession #:    [id number]       Weight:       166.9 lb Date of Birth:  05/15/1955        BSA:          1.933 m Patient Age:    68 years         BP:           187/96 mmHg Patient Gender: M                HR:           61 bpm. Exam Location:  Inpatient Procedure: 2D Echo, Cardiac Doppler and Color Doppler (Both Spectral and Color            Flow Doppler were utilized during procedure). Indications:    CHF-Acute Diastolic I50.31  History:        Patient has prior history of Echocardiogram examinations, most                 recent 06/12/2023. CHF, CAD, CKD, stage 2, Arrythmias:RBBB,                 Signs/Symptoms:Chest Pain; Risk Factors:Hypertension and                 Dyslipidemia.  Sonographer:    Thea Norlander RCS Referring Phys: SUBRINA SUNDIL IMPRESSIONS  1. Left ventricular ejection fraction, by estimation, is 45 to 50%. The left ventricle has mildly decreased function. The left ventricle demonstrates regional wall motion abnormalities (see scoring diagram/findings for description). There is mild concentric left ventricular hypertrophy. Left ventricular diastolic parameters are consistent with Grade II diastolic dysfunction (pseudonormalization). There is moderate hypokinesis of the left ventricular, basal-mid inferior wall and inferolateral wall.  2. Right ventricular systolic function is normal. The right ventricular size is normal.  3. Left atrial size was mild to moderately dilated.  4. The mitral  valve is normal in structure. Mild to  moderate mitral valve regurgitation. No evidence of mitral stenosis.  5. The aortic valve is tricuspid. There is mild calcification of the aortic valve. There is mild thickening of the aortic valve. Aortic valve regurgitation is not visualized. Aortic valve sclerosis/calcification is present, without any evidence of aortic stenosis. Comparison(s): A prior study was performed on 06/12/2023. Prior images reviewed side by side. The left ventricular function is worsened. The left ventricular wall motion abnormalities are worse. (there is more extensive involvement of the inferior wall) However, LV function is overall quite similar to the 2022 report. FINDINGS  Left Ventricle: Left ventricular ejection fraction, by estimation, is 45 to 50%. The left ventricle has mildly decreased function. The left ventricle demonstrates regional wall motion abnormalities. Moderate hypokinesis of the left ventricular, basal-mid inferior wall and inferolateral wall. The left ventricular internal cavity size was normal in size. There is mild concentric left ventricular hypertrophy. Left ventricular diastolic parameters are consistent with Grade II diastolic dysfunction (pseudonormalization).  LV Wall Scoring: The inferior wall and posterior wall are hypokinetic. Right Ventricle: The right ventricular size is normal. No increase in right ventricular wall thickness. Right ventricular systolic function is normal. Left Atrium: Left atrial size was mild to moderately dilated. Right Atrium: Right atrial size was normal in size. Pericardium: There is no evidence of pericardial effusion. Mitral Valve: The mitral valve is normal in structure. Mild to moderate mitral valve regurgitation. No evidence of mitral valve stenosis. Tricuspid Valve: The tricuspid valve is normal in structure. Tricuspid valve regurgitation is not demonstrated. Aortic Valve: The aortic valve is tricuspid. There is mild calcification of the aortic valve. There is mild  thickening of the aortic valve. Aortic valve regurgitation is not visualized. Aortic valve sclerosis/calcification is present, without any evidence of aortic stenosis. Aortic valve mean gradient measures 3.9 mmHg. Aortic valve peak gradient measures 8.0 mmHg. Aortic valve area, by VTI measures 3.32 cm. Pulmonic Valve: The pulmonic valve was grossly normal. Pulmonic valve regurgitation is trivial. No evidence of pulmonic stenosis. Aorta: The aortic root and ascending aorta are structurally normal, with no evidence of dilitation. IAS/Shunts: No atrial level shunt detected by color flow Doppler.  LEFT VENTRICLE PLAX 2D LVIDd:         4.60 cm   Diastology LVIDs:         3.40 cm   LV e' medial:    7.29 cm/s LV PW:         1.20 cm   LV E/e' medial:  13.9 LV IVS:        1.20 cm   LV e' lateral:   8.27 cm/s LVOT diam:     2.09 cm   LV E/e' lateral: 12.2 LV SV:         86 LV SV Index:   45 LVOT Area:     3.43 cm  RIGHT VENTRICLE             IVC RV S prime:     12.80 cm/s  IVC diam: 2.70 cm TAPSE (M-mode): 2.0 cm LEFT ATRIUM             Index        RIGHT ATRIUM           Index LA diam:        3.80 cm 1.97 cm/m   RA Area:     16.40 cm LA Vol (A2C):   67.3 ml 34.82 ml/m  RA Volume:  44.30 ml  22.92 ml/m LA Vol (A4C):   55.5 ml 28.72 ml/m LA Biplane Vol: 61.0 ml 31.56 ml/m  AORTIC VALVE AV Area (Vmax):    2.99 cm AV Area (Vmean):   3.16 cm AV Area (VTI):     3.32 cm AV Vmax:           141.17 cm/s AV Vmean:          90.871 cm/s AV VTI:            0.259 m AV Peak Grad:      8.0 mmHg AV Mean Grad:      3.9 mmHg LVOT Vmax:         123.15 cm/s LVOT Vmean:        83.828 cm/s LVOT VTI:          0.251 m LVOT/AV VTI ratio: 0.97  AORTA Ao Root diam: 3.50 cm Ao Asc diam:  3.40 cm MITRAL VALVE                TRICUSPID VALVE MV Area (PHT): 3.77 cm     TR Peak grad:   35721.0 mmHg MV Decel Time: 201 msec     TR Vmax:        9450.00 cm/s MV E velocity: 101.00 cm/s MV A velocity: 53.40 cm/s   SHUNTS MV E/A ratio:  1.89          Systemic VTI:  0.25 m                             Systemic Diam: 2.09 cm Jerel Croitoru MD Electronically signed by Jerel Balding MD Signature Date/Time: 10/22/2023/2:56:55 PM    Final    CT Angio Chest PE W and/or Wo Contrast Result Date: 10/21/2023 CLINICAL DATA:  Pulmonary embolism (PE) suspected, low to intermediate prob, neg D-dimer. Shortness of breath EXAM: CT ANGIOGRAPHY CHEST WITH CONTRAST TECHNIQUE: Multidetector CT imaging of the chest was performed using the standard protocol during bolus administration of intravenous contrast. Multiplanar CT image reconstructions and MIPs were obtained to evaluate the vascular anatomy. RADIATION DOSE REDUCTION: This exam was performed according to the departmental dose-optimization program which includes automated exposure control, adjustment of the mA and/or kV according to patient size and/or use of iterative reconstruction technique. CONTRAST:  75mL OMNIPAQUE  IOHEXOL  350 MG/ML SOLN COMPARISON:  02/09/2023 FINDINGS: Cardiovascular: Heart is normal size. Aorta is normal caliber. Moderate coronary artery and aortic atherosclerosis. No filling defects in the pulmonary arteries to suggest pulmonary emboli. Mediastinum/Nodes: No mediastinal, hilar, or axillary adenopathy. Trachea and esophagus are unremarkable. Thyroid  unremarkable. Lungs/Pleura: Moderate to large bilateral pleural effusions. Mild vascular congestion. Interstitial prominence and ground-glass perihilar opacities suggest edema. Upper Abdomen: No acute findings Musculoskeletal: Chest wall soft tissues are unremarkable. No acute bony abnormality. Review of the MIP images confirms the above findings. IMPRESSION: No evidence of pulmonary embolus. Moderate to large bilateral pleural effusions. Suspect mild interstitial edema. Coronary artery disease. Aortic Atherosclerosis (ICD10-I70.0). Electronically Signed   By: Franky Crease M.D.   On: 10/21/2023 19:48   DG Chest 2 View Result Date:  10/21/2023 CLINICAL DATA:  Shortness of breath. EXAM: CHEST - 2 VIEW COMPARISON:  August 31, 2023 FINDINGS: The heart size and mediastinal contours are within normal limits. Moderate severity diffusely increased interstitial lung markings are seen. Mild atelectatic changes are noted within the bilateral lung bases. No pleural effusion or pneumothorax is identified. The visualized skeletal structures are  unremarkable. IMPRESSION: Moderate severity interstitial edema with mild bibasilar atelectasis. Electronically Signed   By: Suzen Dials M.D.   On: 10/21/2023 16:45    Microbiology: Recent Results (from the past 240 hours)  Resp panel by RT-PCR (RSV, Flu A&B, Covid) Anterior Nasal Swab     Status: None   Collection Time: 10/21/23  4:02 PM   Specimen: Anterior Nasal Swab  Result Value Ref Range Status   SARS Coronavirus 2 by RT PCR NEGATIVE NEGATIVE Final   Influenza A by PCR NEGATIVE NEGATIVE Final   Influenza B by PCR NEGATIVE NEGATIVE Final    Comment: (NOTE) The Xpert Xpress SARS-CoV-2/FLU/RSV plus assay is intended as an aid in the diagnosis of influenza from Nasopharyngeal swab specimens and should not be used as a sole basis for treatment. Nasal washings and aspirates are unacceptable for Xpert Xpress SARS-CoV-2/FLU/RSV testing.  Fact Sheet for Patients: BloggerCourse.com  Fact Sheet for Healthcare Providers: SeriousBroker.it  This test is not yet approved or cleared by the [place]  FDA and has been authorized for detection and/or diagnosis of SARS-CoV-2 by FDA under an Emergency Use Authorization (EUA). This EUA will remain in effect (meaning this test can be used) for the duration of the COVID-19 declaration under Section 564(b)(1) of the Act, 21 U.S.C. section 360bbb-3(b)(1), unless the authorization is terminated or revoked.     Resp Syncytial Virus by PCR NEGATIVE NEGATIVE Final    Comment: (NOTE) Fact Sheet for  Patients: BloggerCourse.com  Fact Sheet for Healthcare Providers: SeriousBroker.it  This test is not yet approved or cleared by the [place]  FDA and has been authorized for detection and/or diagnosis of SARS-CoV-2 by FDA under an Emergency Use Authorization (EUA). This EUA will remain in effect (meaning this test can be used) for the duration of the COVID-19 declaration under Section 564(b)(1) of the Act, 21 U.S.C. section 360bbb-3(b)(1), unless the authorization is terminated or revoked.  Performed at [facility], [address], [place], [place] [address]      Labs: Basic Metabolic Panel: Recent Labs  Lab 10/21/23 1602 10/22/23 0215 10/23/23 0249 10/24/23 0219  NA 132* 134* 133* 133*  K 4.0 3.4* 2.9* 3.9  CL 98 100 94* 96*  CO2 24 24 24 26   GLUCOSE 113* 111* 122* 158*  BUN 13 12 14 17   CREATININE 0.84 0.79 0.97 0.89  CALCIUM  8.8* 8.7* 9.3 9.5  MG  --   --   --  2.0   Liver Function Tests: Recent Labs  Lab 10/21/23 1602 10/22/23 0215  AST 24 20  ALT 20 18  ALKPHOS 46 42  BILITOT 0.7 0.7  PROT 6.2* 5.7*  ALBUMIN 3.8 3.4*   No results for input(s): LIPASE, AMYLASE in the last 168 hours. No results for input(s): AMMONIA in the last 168 hours. CBC: Recent Labs  Lab 10/21/23 1602 10/22/23 0215  WBC 8.5 6.7  NEUTROABS 6.9  --   HGB 11.8* 11.1*  HCT 35.7* 33.2*  MCV 94.2 92.7  PLT 191 186   Cardiac Enzymes: No results for input(s): CKTOTAL, CKMB, CKMBINDEX, TROPONINI in the last 168 hours. BNP: BNP (last 3 results) Recent Labs    10/21/23 1554  BNP 681.3*    ProBNP (last 3 results) No results for input(s): PROBNP in the last 8760 hours.  CBG: Recent Labs  Lab 10/23/23 0850 10/23/23 1126 10/23/23 1530 10/23/23 2112 10/24/23 0617  GLUCAP 169* 133* 130* 145* 107*       Signed:  Sigurd Pac  MD.  [facility] 10/24/2023, 9:46 AM

## 2023-10-24 NOTE — TOC Initial Note (Signed)
 Transition of Care [facility]) - Initial/Assessment Note    Patient Details  Name: Randall [name] MRN: [id number] Date of Birth: [birth year]  Transition of Care [facility]) CM/SW Contact:    Waddell Barnie Rama, RN Phone Number: 10/24/2023, 10:07 AM  Clinical Narrative:                 From home with spouse, has PCP and insurance on file, states has no HH services in place at this time bp cuff, pulse oximetry, scale and lymphadema pump at home.  States family member  (wife) will transport them home at [employer] and family is support system, states [facility] [place].  Pta self ambulatory.   There are no ICM needs identified  at this time.  Please place consult for ICM  needs.    Expected Discharge Plan: Home/Self Care Barriers to Discharge: No Barriers Identified   Patient Goals and CMS Choice Patient states their goals for this hospitalization and ongoing recovery are:: return home with wife   Choice offered to / list presented to : NA      Expected Discharge Plan and Services In-house Referral: NA Discharge Planning Services: CM Consult Post Acute Care Choice: NA Living arrangements for the past 2 months: Single Family Home Expected Discharge Date: 10/24/23               DME Arranged: N/A DME Agency: NA       HH Arranged: NA          Prior Living Arrangements/Services Living arrangements for the past 2 months: Single Family Home Lives with:: Spouse Patient language and need for interpreter reviewed:: Yes Do you feel safe going back to the place where you live?: Yes      Need for Family Participation in Patient Care: Yes (Comment) Care giver support system in place?: Yes (comment) Current home services: DME (bp cuff, pulse oximetry, scale and lymphadema pump) Criminal Activity/Legal Involvement Pertinent to Current Situation/Hospitalization: No - Comment as needed  Activities of Daily Living      Permission Sought/Granted Permission sought to share  information with : Case Manager Permission granted to share information with : Yes, Verbal Permission Granted  Share Information with NAME: Camie     Permission granted to share info w Relationship: wife  Permission granted to share info w Contact Information: [phone]  Emotional Assessment Appearance:: Appears stated age Attitude/Demeanor/Rapport: Engaged Affect (typically observed): Appropriate Orientation: : Oriented to Self, Oriented to Place, Oriented to  Time, Oriented to Situation Alcohol / Substance Use: Not Applicable Psych Involvement: No (comment)  Admission diagnosis:  Acute CHF (congestive heart failure) (HCC) [I50.9] Dyspnea, unspecified type [R06.00] Patient Active Problem List   Diagnosis Date Noted   Acute exacerbation of CHF (congestive heart failure) (HCC) 10/21/2023   Acute hypoxic respiratory failure (HCC) 10/21/2023   History of CAD (coronary artery disease) 10/21/2023   Chronic acquired lymphedema 10/21/2023   History of laryngeal cancer 10/21/2023   Elevated troponin 10/21/2023   First degree AV block 10/21/2023   Acute CHF (congestive heart failure) (HCC) 10/21/2023   ACS (acute coronary syndrome) (HCC) 06/11/2023   Chest pain 06/10/2023   Hypokalemia 04/06/2023   Anemia 04/06/2023   Hyponatremia 04/05/2023   ARF (acute renal failure) 04/05/2023   CKD (chronic kidney disease), symptom management only, stage 2 (mild) 03/29/2023   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   Hyperlipidemia LDL goal <70  02/21/2022   Elevated coronary artery calcium  score-683 01/26/2021   Thoracic aortic atherosclerosis 01/26/2021   Decreased left ventricular systolic function 12/25/2020   Severe hypertension 11/13/2020   Right bundle branch block 11/13/2020   Hypertensive heart disease with chronic diastolic congestive heart failure (HCC) 11/13/2020   Quadriceps tendon rupture, right, sequela     Non-insulin  dependent type 2 diabetes mellitus (HCC) 03/21/2020   PCP:  James, Natalie W, PA-C Pharmacy:   [phone] [place] [place] - [address] [place] [address] Phone: [phone] Fax: [phone]  [facility] [address] [place] [address] Phone: [phone] Fax: [phone]     Social Drivers of Health (SDOH) Social History: SDOH Screenings   Food Insecurity: No Food Insecurity (10/21/2023)  Housing: Low Risk  (10/21/2023)  Transportation Needs: No Transportation Needs (10/21/2023)  Utilities: Not At Risk (10/21/2023)  Alcohol Screen: Low Risk  (10/23/2023)  Depression (PHQ2-9): Low Risk  (07/01/2022)  Financial Resource Strain: Low Risk  (10/23/2023)  Physical Activity: Sufficiently Active (08/14/2023)   [facility]  Social Connections: Socially Isolated (10/21/2023)  Stress: No Stress Concern Present (08/14/2023)   Received from [facility]  Tobacco Use: Low Risk  (10/21/2023)  Health Literacy: Adequate Health Literacy (09/21/2022)   SDOH Interventions: Alcohol Usage Interventions: Intervention Not Indicated (Score <7) Financial Strain Interventions: Intervention Not Indicated   Readmission Risk Interventions    10/24/2023   10:03 AM  Readmission Risk Prevention Plan  Transportation Screening Complete  PCP or Specialist Appt within 3-5 Days Complete  HRI or Home Care Consult Complete  Palliative Care Screening Not Applicable  Medication Review (RN Care Manager) Complete

## 2023-10-24 NOTE — Progress Notes (Signed)
  Progress Note  Patient Name: Randall [name] Date of Encounter: 10/24/2023 [facility] Cardiologist: Alm Clay, MD   Interval Summary    Feels well this morning, has been walking in the hallway and breathing is much improved.   Vital Signs Vitals:   10/23/23 2029 10/23/23 2314 10/24/23 0333 10/24/23 0720  BP: (!) 143/73 (!) 140/73 132/67 (!) 154/84  Pulse: 68 60 68 (!) 59  Resp: 17 18 18 18   Temp: 98.6 F (37 C) 98.5 F (36.9 C) 98.5 F (36.9 C) 97.9 F (36.6 C)  TempSrc: Oral Oral Oral Oral  SpO2: 99% 98% 97% 97%  Weight:   69.4 kg   Height:        Intake/Output Summary (Last 24 hours) at 10/24/2023 0837 Last data filed at 10/24/2023 0737 Gross per 24 hour  Intake 932 ml  Output 3100 ml  Net -2168 ml      10/24/2023    3:33 AM 10/21/2023   10:37 PM 10/21/2023    3:45 PM  Last 3 Weights  Weight (lbs) 153 lb 166 lb 14.2 oz 141 lb 15.6 oz  Weight (kg) 69.4 kg 75.7 kg 64.4 kg      Telemetry/ECG   Sinus Rhythm 70s - Personally Reviewed  Physical Exam  GEN: No acute distress.   Neck: No JVD Cardiac: RRR, no murmurs, rubs, or gallops.  Respiratory: Clear to auscultation bilaterally. GI: Soft, nontender, non-distended  MS: No edema  Assessment & Plan   68 y.o. male with a hx of hypertension, hyperlipidemia, DM2, right bundle branch block, laryngeal cancer and history of diastolic heart failure who is being seen for the evaluation of CHF   Acute on Chronic combined HF -- Echo with LVEF of 45-50%, g2DD, mild LVH, g2DD, moderate hypokinesis of the LV, basal-mid inferior wall and inferolateral wall, normal RV -- hyponatremia previously on chlorthalidone   -- interstitial edema on CT chest with B pleural effusions on admission -- diuresed with IV lasix , net - 9.4L. Weight down 153lbs -- GDMT: coreg  25mg  BID, Entresto 49-51mg  BID, spiro 25mg  daily, lasix  40mg  daily  HTN -- as above, coreg , entresto and spiro  HLD -- 06/11/2023: Cholesterol 103;  HDL 43; LDL Cholesterol 54; Triglycerides 32; VLDL 6 -- continue lipitor 80 mg  CAD -- cath 06/2023 with moderate single-vessel diease of 60% RPDA -- continue ASA, statin    For questions or updates, please contact [facility] Please consult [email or web address] for contact info under    Signed, Manuelita Rummer, NP

## 2023-10-26 NOTE — Progress Notes (Signed)
 HEART & VASCULAR TRANSITION OF CARE CONSULT NOTE     Referring Physician: Dr. Fairy PCP: Lynwood Laneta ORN, PA-C  Cardiologist: Alm Clay, MD  Chief Complaint: Chronic mid-range CHF  HPI: Referred to clinic by Dr. Fairy for heart failure consultation.   Randall [name] is a 68 y.o. male with chronic mid-range HF, HTN, HLD, RBBB and laryngeal and pyriform sinus cancer. Followed by Dr. Clay.   Admitted 5/25 with chest pain. Underwent LHC 6/25 which showed mod single vessel disease with 60% RPDA stenosis. Normal EF and normal EDP. Echo then with EF 55-60%, inferolateral HK, GIIDD, normal RV, mild MR.   Admitted 10/25 with A/C mid-range EF. Diuresed almost 10L with IV lasix . Echo 10/25 EF 45-50%, LV with RWMA, GIIDD, RV normal, LA mod dilated, mild-mod MR. Started on GDMT.   Recently went to urgent care for hives. (New meds at d/c Spiro and Entresto) Started on prednisone taper.  Today he presents for AHF [facility] visit. Overall feeling pretty good. Denies palpitations, CP, edema, or PND/Orthopnea. Intt dizziness, resolves with rest. No SOB. Appetite slowly improving, watches sodium intake.  Drinks 3 ensure or premier's/day. Drinks 60-70 oz/day. Weight at home 167 pounds. Taking all medications. Denies ETOH, tobacco or drug use. Walks ~10 miles daily.  Got hives on the back of his arm and the back of his head, had to wait 2 days to start prednisone pack and during that time itchiness had subsided without treatment. Last day of prednisone taper is tomorrow.  SBP at home 115s-130s. Has been measuring intake and output at home.   Past Medical History:  Diagnosis Date   Arthritis    CHF (congestive heart failure) (HCC)    CKD (chronic kidney disease) stage 2, GFR 60-89 ml/min    Complication of anesthesia    Diabetes mellitus without complication (HCC)    Elevated coronary artery calcium  score 02/02/2021   GERD (gastroesophageal reflux disease)    History of kidney stones  2014   Hypertension    Kidney stone 06/17/2015   Laryngeal mass    squamous carcinoid cancer   Lymphedema    PONV (postoperative nausea and vomiting)    Rupture of right quadriceps tendon 09/21/2020   - s/p srugical repair   Squamous cell carcinoma of neck    Thoracic aortic atherosclerosis    Tuberculosis 1980   Type 2 diabetes mellitus without complication, without long-term current use of insulin  (HCC) 03/21/2020    Current Outpatient Medications  Medication Sig Dispense Refill   aspirin  EC 81 MG tablet Take 81 mg by mouth daily. Swallow whole.     atorvastatin  (LIPITOR) 80 MG tablet Take 1 tablet (80 mg total) by mouth daily. 90 tablet 1   carvedilol  (COREG ) 25 MG tablet Take 25 mg by mouth in the morning and at bedtime.     chlorhexidine  (PERIDEX ) 0.12 % solution Use as directed 10 mLs in the mouth or throat 2 (two) times daily.     Continuous Glucose Sensor (FREESTYLE LIBRE 3 PLUS SENSOR) MISC REPLACE SENSOR EVERY 2 WEEKS - DIAGNOSIS TYPE 2 DM     docusate sodium  (COLACE) 100 MG capsule Take 100 mg by mouth in the morning, at noon, and at bedtime.     Dulaglutide (TRULICITY [place]) Inject 0.5 mg into the skin once a week. Thursday     fluticasone (FLONASE) 50 MCG/ACT nasal spray Place 1 spray into both nostrils daily for 3 days. 16 g 0   furosemide  (  LASIX ) 40 MG tablet Take 1 tablet (40 mg total) by mouth daily. 30 tablet 1   geriatric multivitamins-minerals (ELDERTONIC/GEVRABON) LIQD Take 15 mLs by mouth daily.     HYDROcodone -acetaminophen  (HYCET) 7.5-325 mg/15 ml solution Take 10 mL up to TID as needed for pain - not to exceed 30mL daily. 600 mL 0   levocetirizine (XYZAL ALLERGY 24HR) 5 MG tablet Take 1 tablet (5 mg total) by mouth every evening. 30 tablet 3   loperamide  (IMODIUM  A-D) 2 MG tablet Take 2 mg by mouth in the morning, at noon, and at bedtime.     LORazepam  (ATIVAN ) 0.5 MG tablet TAKE 1 TABLET (0.5 MG TOTAL) BY MOUTH EVERY 8 (EIGHT) HOURS. TAKE AS NEEDED FOR AVERSION TO  FEEDINGS     magic mouthwash SOLN Take 10 mLs by mouth 4 (four) times daily. Suspension contains equal amounts of Maalox Extra Strength, nystatin, and diphenhydramine .     Multiple Vitamin (MULTIVITAMIN PO) Take 1 tablet by mouth daily.     ondansetron  (ZOFRAN ) 8 MG tablet Take 8 mg by mouth in the morning, at noon, and at bedtime.     OVER THE COUNTER MEDICATION Take 30 mLs by mouth every 6 (six) hours as needed (Pain). PainQuil     OVER THE COUNTER MEDICATION Take 2 tablets by mouth in the morning and at bedtime. [name]     pantoprazole  (PROTONIX ) 40 MG tablet Take 40 mg by mouth daily.     polyethylene glycol (MIRALAX / GLYCOLAX) 17 g packet Take 17 g by mouth daily as needed for moderate constipation.     sacubitril-valsartan (ENTRESTO) 49-51 MG Take 1 tablet by mouth 2 (two) times daily. 60 tablet 1   Sod Fluoride-Potassium Nitrate (PREVIDENT 5000 ENAMEL PROTECT DT) Place 1 application  onto teeth in the morning and at bedtime.     solifenacin (VESICARE) 10 MG tablet Take 10 mg by mouth daily.     spironolactone (ALDACTONE) 25 MG tablet Take 1 tablet (25 mg total) by mouth daily. 30 tablet 1   tadalafil (CIALIS) 20 MG tablet Take 20 mg by mouth daily as needed for erectile dysfunction.     No current facility-administered medications for this visit.    Allergies  Allergen Reactions   Norgesic Forte [Orphenadrine-Aspirin -Caffeine] Other (See Comments)    Unknown reaction    Latex Hives and Rash    Blisters      Social History   Socioeconomic History   Marital status: Married    Spouse name: Camie   Number of children: 1   Years of education: Not on file   Highest education level: High school graduate  Occupational History   Occupation: Retired  Tobacco Use   Smoking status: Never   Smokeless tobacco: Never  Vaping Use   Vaping status: Never Used  Substance and Sexual Activity   Alcohol use: Never   Drug use: Never   Sexual activity: Not Currently  Other Topics  Concern   Not on file  Social History Narrative   Usually very active - but no routine exercise. 1 daughter, 2 stepsons   Social Drivers of Corporate investment banker Strain: Low Risk  (10/23/2023)   Overall Financial Resource Strain (CARDIA)    Difficulty of Paying Living Expenses: Not very hard  Food Insecurity: No Food Insecurity (10/21/2023)   Hunger Vital Sign    Worried About Running Out of Food in the Last Year: Never true    Ran Out of Food in  the Last Year: Never true  Transportation Needs: No Transportation Needs (10/21/2023)   PRAPARE - Administrator, Civil Service (Medical): No    Lack of Transportation (Non-Medical): No  Physical Activity: Sufficiently Active (08/14/2023)   Received from [facility]   Exercise Vital Sign    On average, how many days per week do you engage in moderate to strenuous exercise (like a brisk walk)?: 7 days    On average, how many minutes do you engage in exercise at this level?: 150+ min  Stress: No Stress Concern Present (08/14/2023)   [facility] from [facility] - Occupational Stress Questionnaire    Do you feel stress - tense, restless, nervous, or anxious, or unable to sleep at night because your mind is troubled all the time - these days?: Not at all  Social Connections: Socially Isolated (10/21/2023)   Social Connection and Isolation Panel    Frequency of Communication with Friends and Family: Never    Frequency of Social Gatherings with Friends and Family: Never    Attends Religious Services: Patient declined    Database administrator or Organizations: No    Attends Banker Meetings: Never    Marital Status: Married  Catering manager Violence: Not At Risk (10/21/2023)   Humiliation, Afraid, Rape, and Kick questionnaire    Fear of Current or Ex-Partner: No    Emotionally Abused: No    Physically Abused: No    Sexually Abused: No     No family history on  file.  There were no vitals filed for this visit.  PHYSICAL EXAM: General:  well appearing.  No respiratory difficulty Neck: JVD UTA (wearing cervical collar).  Cor: Regular rate & rhythm. No murmurs. Lungs: clear, diminished bases Extremities: no edema  Neuro: alert & oriented x 3. Affect pleasant.   ECG: none today  ReDs reading: 30 %, normal   ASSESSMENT & PLAN: Chronic HF with mid-range EF - Echo 6/25: EF 55-60%, inferolateral HK, GIIDD, normal RV, mild MR.  - Echo 10/25 EF 45-50%, LV with RWMA, GIIDD, RV normal, LA mod dilated, mild-mod MR - NYHA I-II - Volume stable on exam today. ReDS 30%.  - Continue Lasix  40 mg daily - Continue coreg  25 mg twice daily - Continue Entresto 49-51 mg twice daily. Has close follow up with [facility] next week. If there is a reoccurrence of hives I asked him to stop Entresto) - Continue spiro 25 mg daily  - Consider SGLT2i at f/u, may be able to cut back on lasix . Didn't want to add a new med while still on prednisone taper and with recent hives.  - BMET/BNP today (drinks multiple ensure throughout the day)  CAD - LHC 6/25: mod single vessel disease with 60% RPDA stenosis. Normal EF and normal EDP. - Continue ASA, statin, BB - Denies CP  HTN - BP slightly elevated. SBP 115-130s at home.   Lymphedema - chronic lymphedema 2/2 radiation therapy  - has lymphedema pumps (uses 2 times a day for an hour) and compression hose   DM - mgmt per PCP  HLD - LDL 54 6/25. - Continue statin   Referred to HFSW (PCP, Medications, Transportation, ETOH Abuse, Drug Abuse, Insurance, Financial ):  No Refer to Pharmacy: No Refer to Home Health:No Refer to Advanced Heart Failure Clinic: No  Refer to General Cardiology: Patient of Dr. Anner  Follow up in ~4 weeks to reassess Entresto tolerance and to  possibly add SGLT2i.

## 2023-10-27 ENCOUNTER — Telehealth: Payer: Self-pay | Admitting: *Deleted

## 2023-10-27 ENCOUNTER — Telehealth (HOSPITAL_COMMUNITY): Payer: Self-pay | Admitting: Cardiology

## 2023-10-27 NOTE — Telephone Encounter (Signed)
 Patient left VM on triage line Reports he has question about medication ?medication reaction to new med entresto Itchy all over   Returned call for additional details No answer Mail box full

## 2023-10-27 NOTE — Telephone Encounter (Signed)
 Called patient to inform of CT for 11-02-23- arrival time- 4:45 pm @ [facility],no restrictions to scan, patient to receive results from Dr. Izell on 11-07-23 @ 3 pm, spoke with patient and he is aware of these appts. and the instructions

## 2023-10-28 ENCOUNTER — Other Ambulatory Visit: Payer: Self-pay | Admitting: Nurse Practitioner

## 2023-10-28 ENCOUNTER — Other Ambulatory Visit (INDEPENDENT_AMBULATORY_CARE_PROVIDER_SITE_OTHER): Payer: Self-pay | Admitting: Otolaryngology

## 2023-10-28 ENCOUNTER — Other Ambulatory Visit: Payer: Self-pay | Admitting: Obstetrics and Gynecology

## 2023-10-31 ENCOUNTER — Telehealth (HOSPITAL_COMMUNITY): Payer: Self-pay

## 2023-10-31 NOTE — Telephone Encounter (Signed)
 Called to confirm/remind patient of their appointment at the Advanced [facility] Clinic on 11/01/23 9:00.   Appointment:   [] Confirmed  [] Left mess   [] No answer/No voice mail  [x] VM Full/unable to leave message  [] Phone not in service  Patient reminded to bring all medications and/or complete list.  Confirmed patient has transportation. Gave directions, instructed to utilize valet parking.

## 2023-11-01 ENCOUNTER — Inpatient Hospital Stay (HOSPITAL_BASED_OUTPATIENT_CLINIC_OR_DEPARTMENT_OTHER)
Admit: 2023-11-01 | Discharge: 2023-11-01 | Disposition: A | Discharge status: Home / Self Care | Attending: Internal Medicine | Admitting: Internal Medicine

## 2023-11-01 ENCOUNTER — Encounter (HOSPITAL_COMMUNITY): Payer: Self-pay

## 2023-11-01 ENCOUNTER — Ambulatory Visit (HOSPITAL_COMMUNITY): Payer: Self-pay | Admitting: Internal Medicine

## 2023-11-01 VITALS — BP 138/70 | HR 69 | Ht 70.0 in | Wt 173.4 lb

## 2023-11-01 DIAGNOSIS — I10 Essential (primary) hypertension: Secondary | ICD-10-CM

## 2023-11-01 DIAGNOSIS — E785 Hyperlipidemia, unspecified: Secondary | ICD-10-CM

## 2023-11-01 DIAGNOSIS — I5020 Unspecified systolic (congestive) heart failure: Secondary | ICD-10-CM | POA: Diagnosis not present

## 2023-11-01 DIAGNOSIS — I890 Lymphedema, not elsewhere classified: Secondary | ICD-10-CM | POA: Diagnosis not present

## 2023-11-01 DIAGNOSIS — I2510 Atherosclerotic heart disease of native coronary artery without angina pectoris: Secondary | ICD-10-CM | POA: Diagnosis not present

## 2023-11-01 DIAGNOSIS — E118 Type 2 diabetes mellitus with unspecified complications: Secondary | ICD-10-CM

## 2023-11-01 LAB — BASIC METABOLIC PANEL WITH GFR
Anion gap: 12 (ref 5–15)
BUN: 11 mg/dL (ref 8–23)
CO2: 26 mmol/L (ref 22–32)
Calcium: 9 mg/dL (ref 8.9–10.3)
Chloride: 99 mmol/L (ref 98–111)
Creatinine, Ser: 0.77 mg/dL (ref 0.61–1.24)
GFR, Estimated: >60 mL/min (ref >60)
Glucose, Bld: 161 mg/dL — ABNORMAL HIGH (ref 70–99)
Potassium: 3.7 mmol/L (ref 3.5–5.1)
Sodium: 137 mmol/L (ref 135–145)

## 2023-11-01 LAB — BRAIN NATRIURETIC PEPTIDE: B Natriuretic Peptide: 248.3 pg/mL — ABNORMAL HIGH (ref 0.0–100.0)

## 2023-11-01 NOTE — Progress Notes (Signed)
 ReDS Vest / Clip - 11/01/23 0921       ReDS Vest / Clip   Station Marker C    Ruler Value 35    ReDS Value Range Low volume    ReDS Actual Value 30

## 2023-11-01 NOTE — Patient Instructions (Signed)
 Medication Changes:  No Changes In Medications at this time.   Lab Work: Labs done today, your results will be available in MyChart, we will contact you for abnormal readings.  Follow-Up in: 3-4 WEEKS WITH TOC CLINIC  At the [facility], you and your health needs are our priority. We have a designated team specialized in the treatment of Heart Failure. This Care Team includes your primary Heart Failure Specialized Cardiologist (physician), Advanced Practice Providers (APPs- Physician Assistants and Nurse Practitioners), and Pharmacist who all work together to provide you with the care you need, when you need it.   You may see any of the following providers on your designated Care Team at your next follow up:  Dr. Toribio Fuel Dr. Ezra Shuck Dr. Ria Commander Dr. Odis Brownie Greig Mosses, NP Caffie Shed, [place] [facility] [place], [place] Beckey Coe, NP [place] Lee, NP Tinnie Redman, PharmD   Please be sure to bring in all your medications bottles to every appointment.   Need to Contact Us :  If you have any questions or concerns before your next appointment please send us  a message through [place] or call our office at [phone].    TO LEAVE A MESSAGE FOR THE NURSE SELECT OPTION 2, PLEASE LEAVE A MESSAGE INCLUDING: YOUR NAME DATE OF BIRTH CALL BACK NUMBER REASON FOR CALL**this is important as we prioritize the call backs  YOU WILL RECEIVE A CALL BACK THE SAME DAY AS LONG AS YOU CALL BEFORE 4:00 PM

## 2023-11-02 ENCOUNTER — Ambulatory Visit (HOSPITAL_COMMUNITY)
Admission: RE | Admit: 2023-11-02 | Discharge: 2023-11-02 | Disposition: A | Discharge status: Home / Self Care | Source: Ambulatory Visit | Attending: Radiology | Admitting: Radiology

## 2023-11-02 DIAGNOSIS — C12 Malignant neoplasm of pyriform sinus: Secondary | ICD-10-CM | POA: Insufficient documentation

## 2023-11-02 MED ORDER — IOHEXOL 300 MG/ML  SOLN
75.0000 mL | Freq: Once | INTRAMUSCULAR | Status: AC | PRN
  Administered 2023-11-02: 75 mL via INTRAVENOUS

## 2023-11-02 MED ORDER — SODIUM CHLORIDE (PF) 0.9 % IJ SOLN
INTRAMUSCULAR | Status: AC
  Filled 2023-11-02: qty 50

## 2023-11-06 ENCOUNTER — Telehealth (HOSPITAL_COMMUNITY): Payer: Self-pay | Admitting: Cardiology

## 2023-11-06 NOTE — Telephone Encounter (Signed)
 Patient called to report he has completed prednisone   Reports last dose of prednisone was Friday  Reports itching returned immediately    Advised provider noted to stop entresto if hives/itching returned. Additional medication changes will be made at follow   -will forward to provider if additional changes are needed/ med adjustments

## 2023-11-07 ENCOUNTER — Ambulatory Visit
Admission: RE | Admit: 2023-11-07 | Discharge: 2023-11-07 | Disposition: A | Discharge status: Home / Self Care | Source: Ambulatory Visit | Attending: Radiation Oncology | Admitting: Radiation Oncology

## 2023-11-07 VITALS — BP 171/82 | HR 74 | Temp 97.9°F | Resp 20 | Ht 70.0 in | Wt 167.4 lb

## 2023-11-07 DIAGNOSIS — K121 Other forms of stomatitis: Secondary | ICD-10-CM | POA: Diagnosis not present

## 2023-11-07 DIAGNOSIS — J90 Pleural effusion, not elsewhere classified: Secondary | ICD-10-CM | POA: Insufficient documentation

## 2023-11-07 DIAGNOSIS — I340 Nonrheumatic mitral (valve) insufficiency: Secondary | ICD-10-CM | POA: Diagnosis not present

## 2023-11-07 DIAGNOSIS — I358 Other nonrheumatic aortic valve disorders: Secondary | ICD-10-CM | POA: Insufficient documentation

## 2023-11-07 DIAGNOSIS — I5031 Acute diastolic (congestive) heart failure: Secondary | ICD-10-CM | POA: Insufficient documentation

## 2023-11-07 DIAGNOSIS — I2510 Atherosclerotic heart disease of native coronary artery without angina pectoris: Secondary | ICD-10-CM | POA: Insufficient documentation

## 2023-11-07 DIAGNOSIS — Z923 Personal history of irradiation: Secondary | ICD-10-CM | POA: Insufficient documentation

## 2023-11-07 DIAGNOSIS — Z7982 Long term (current) use of aspirin: Secondary | ICD-10-CM | POA: Insufficient documentation

## 2023-11-07 DIAGNOSIS — I371 Nonrheumatic pulmonary valve insufficiency: Secondary | ICD-10-CM | POA: Diagnosis not present

## 2023-11-07 DIAGNOSIS — I700 Atherosclerosis of aorta: Secondary | ICD-10-CM | POA: Diagnosis not present

## 2023-11-07 DIAGNOSIS — I890 Lymphedema, not elsewhere classified: Secondary | ICD-10-CM | POA: Insufficient documentation

## 2023-11-07 DIAGNOSIS — M542 Cervicalgia: Secondary | ICD-10-CM | POA: Diagnosis not present

## 2023-11-07 DIAGNOSIS — Z79899 Other long term (current) drug therapy: Secondary | ICD-10-CM | POA: Diagnosis not present

## 2023-11-07 DIAGNOSIS — I130 Hypertensive heart and chronic kidney disease with heart failure and stage 1 through stage 4 chronic kidney disease, or unspecified chronic kidney disease: Secondary | ICD-10-CM | POA: Insufficient documentation

## 2023-11-07 DIAGNOSIS — C12 Malignant neoplasm of pyriform sinus: Secondary | ICD-10-CM | POA: Insufficient documentation

## 2023-11-07 DIAGNOSIS — C329 Malignant neoplasm of larynx, unspecified: Secondary | ICD-10-CM

## 2023-11-07 DIAGNOSIS — R040 Epistaxis: Secondary | ICD-10-CM | POA: Insufficient documentation

## 2023-11-07 DIAGNOSIS — Z7985 Long-term (current) use of injectable non-insulin antidiabetic drugs: Secondary | ICD-10-CM | POA: Diagnosis not present

## 2023-11-07 NOTE — Progress Notes (Signed)
  [name] presents to the clinic for a follow up in the clinic. 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       Pain issues, if any:  Yes, reports chronic neck pain that is severe.  Patient taking Hycet as needed.  Using a feeding tube?:  Weight changes, if any:No, patient does daily weights.  Swallowing issues, if any: Yes, patient continues to cough with eating.    Smoking or chewing tobacco? No  Using fluoride toothpaste daily? Yes, prevident  Last ENT visit was on: Patient to see Dr. Lauralee in December. Patient was seen a few week ago by local ENT.  Other notable issues, if any:    BP (!) 171/82 (BP Location: Right Arm, Patient Position: Sitting, Cuff Size: Large)   Pulse 74   Temp 97.9 F (36.6 C)   Resp 20   Ht 5' 10 (1.778 m)   Wt 167 lb 6.4 oz (75.9 kg)   SpO2 100%   BMI 24.02 kg/m

## 2023-11-07 NOTE — Telephone Encounter (Signed)
 Pt aware.

## 2023-11-08 NOTE — Progress Notes (Addendum)
 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 Pyriform sinus cancer (HCC)  C12        Cancer Staging  Pyriform sinus cancer (HCC) Staging form: Pharynx - Hypopharynx, AJCC 8th Edition - Clinical stage from 07/04/2022: Stage IVA (cT2, cN2b, cM0) - Unsigned Stage prefix: Initial diagnosis - Pathologic stage from 09/21/2022: Stage IVA (pT2, pN2a, cM0) - Signed by Izell Domino, MD on 09/22/2022 Stage prefix: Initial diagnosis   ==========DELIVERED PLANS==========  First Treatment Date: 2022-10-10 - Last Treatment Date: 2022-11-18   Plan Name: HN_Hypoph Site: Laryngopharynx Technique: IMRT Mode: Photon Dose Per Fraction: 2 Gy Prescribed Dose (Delivered / Prescribed): 60 Gy / 60 Gy Prescribed Fxs (Delivered / Prescribed): 30 / 30    Stage IV (pT2, pN2a, cM0) poorly differentiated squamous cell carcinoma; s/p CO2 laser excision of the hypopharyngeal mass and bilateral neck dissection with one positive LN and adjuvant radiation treatment  CHIEF COMPLAINT:  Here for follow-up and surveillance of throat cancer  Narrative:    Randall [name] is a 68 year old male with a history of  pyriform sinus squamous cell carcinoma who presents for follow-up regarding his cancer remission status and ongoing symptoms.  He experiences persistent sinus issues, including epistaxis and nasal discharge, despite a CT scan in January showing clear sinuses. Flonase exacerbates epistaxis, and a Neti pot is ineffective due to improper palate closure. He is not ready for surgery as previously suggested by an ENT.    He uses a lymphedema pump twice daily to manage facial swelling and pain affecting his right jaw and neck. He has  completed physical therapy and continues home exercises to improve arm mobility after surgery affecting his right side.   Pain issues, if any:  Yes, reports chronic right shoulder/neck pain that is severe.  Patient taking Hycet as needed.  Using a feeding tube?:  Weight changes, if any:No, patient does daily weights.  Swallowing issues, if any: Yes, patient continues to cough with eating.  Smoking or chewing tobacco? No  Using fluoride toothpaste daily? Yes, prevident  Last ENT visit was on: Patient to see Dr. Lauralee in December. Patient was seen a few week ago by local ENT.  Does not wish to f/u with local ENT as he resolved his hearing issues with hearing aids.  He is very active physically.  He was recently hospitalized with CHF, fluid overload in his lungs.  Follows with cardiology     ALLERGIES:  is allergic to norgesic forte [orphenadrine-aspirin -caffeine] and latex.  Meds: Current Outpatient Medications  Medication Sig Dispense Refill   aspirin  EC 81 MG tablet Take 81 mg by mouth daily. Swallow whole.     atorvastatin  (LIPITOR) 80 MG tablet Take 1 tablet (80 mg total) by mouth daily. 90 tablet 1   carvedilol  (COREG ) 25 MG tablet Take 25 mg by mouth in the morning and at bedtime.     chlorhexidine  (PERIDEX ) 0.12 % solution Use as directed 10 mLs in the mouth or throat 2 (two) times daily.     Continuous Glucose Sensor (FREESTYLE LIBRE 3 PLUS SENSOR) MISC REPLACE SENSOR EVERY 2 WEEKS - DIAGNOSIS TYPE 2 DM  docusate sodium  (COLACE) 100 MG capsule Take 100 mg by mouth in the morning, at noon, and at bedtime.     Dulaglutide (TRULICITY Beemer) Inject 0.5 mg into the skin once a week. Thursday     fluticasone (FLONASE) 50 MCG/ACT nasal spray Place 1 spray into both nostrils daily for 3 days. 16 g 0   furosemide  (LASIX ) 40 MG tablet TAKE 1 TABLET BY MOUTH EVERY DAY 90 tablet 3   geriatric multivitamins-minerals (ELDERTONIC/GEVRABON) LIQD Take 15 mLs by mouth daily.      HYDROcodone -acetaminophen  (HYCET) 7.5-325 mg/15 ml solution Take 10 mL up to TID as needed for pain - not to exceed 30mL daily. 600 mL 0   levocetirizine (XYZAL) 5 MG tablet TAKE 1 TABLET BY MOUTH EVERY DAY IN THE EVENING 90 tablet 1   loperamide  (IMODIUM  A-D) 2 MG tablet Take 2 mg by mouth in the morning, at noon, and at bedtime.     LORazepam  (ATIVAN ) 0.5 MG tablet TAKE 1 TABLET (0.5 MG TOTAL) BY MOUTH EVERY 8 (EIGHT) HOURS. TAKE AS NEEDED FOR AVERSION TO FEEDINGS     magic mouthwash SOLN Take 10 mLs by mouth 4 (four) times daily. Suspension contains equal amounts of Maalox Extra Strength, nystatin, and diphenhydramine .     Multiple Vitamin (MULTIVITAMIN PO) Take 1 tablet by mouth daily.     ondansetron  (ZOFRAN ) 8 MG tablet Take 8 mg by mouth in the morning, at noon, and at bedtime.     OVER THE COUNTER MEDICATION Take 30 mLs by mouth every 6 (six) hours as needed (Pain). PainQuil     OVER THE COUNTER MEDICATION Take 2 tablets by mouth in the morning and at bedtime. [name]     pantoprazole  (PROTONIX ) 40 MG tablet Take 40 mg by mouth daily.     polyethylene glycol (MIRALAX / GLYCOLAX) 17 g packet Take 17 g by mouth daily as needed for moderate constipation.     sacubitril-valsartan (ENTRESTO) 49-51 MG Take 1 tablet by mouth 2 (two) times daily. 60 tablet 1   Sod Fluoride-Potassium Nitrate (PREVIDENT 5000 ENAMEL PROTECT DT) Place 1 application  onto teeth in the morning and at bedtime.     solifenacin (VESICARE) 10 MG tablet Take 10 mg by mouth daily.     spironolactone (ALDACTONE) 25 MG tablet Take 1 tablet (25 mg total) by mouth daily. 30 tablet 1   tadalafil (CIALIS) 20 MG tablet Take 20 mg by mouth daily as needed for erectile dysfunction.     No current facility-administered medications for this encounter.    Physical Findings:  The patient is in no acute distress. Patient is alert and oriented. Wt Readings from Last 3 Encounters:  11/07/23 167 lb 6.4 oz (75.9 kg)  11/01/23 173 lb  6.4 oz (78.7 kg)  10/24/23 153 lb (69.4 kg)    height is 5' 10 (1.778 m) and weight is 167 lb 6.4 oz (75.9 kg). His temperature is 97.9 F (36.6 C). His blood pressure is 171/82 (abnormal) and his pulse is 74. His respiration is 20 and oxygen saturation is 100%. .  General: Alert and oriented, in no acute distress HEENT: Head is normocephalic. Extraocular movements are intact. Oropharynx clear ; hearing aids b/l Neck: Neck is notable for no palpable masses.  Mild lymphedema. Neck brace removed Skin: Skin in treatment fields shows satisfactory healing over neck. Chest: Normal respiratory effort.  Clear to auscultation b/l Heart is regular in rate and rhythm   Abdomen: Soft, nontender, nondistended, with  no rigidity or guarding.  No feeding tube. Lymphatics: see Neck Exam Psychiatric: Judgment and insight are intact. Affect is appropriate. MSK: neck is flexed/ head tilted forward   Lab Findings: Lab Results  Component Value Date   WBC 6.7 10/22/2023   HGB 11.1 (L) 10/22/2023   HCT 33.2 (L) 10/22/2023   MCV 92.7 10/22/2023   PLT 186 10/22/2023    Lab Results  Component Value Date   TSH 1.860 06/11/2023   CMP     Component Value Date/Time   NA 137 11/01/2023 0952   NA 135 07/25/2023 1239   K 3.7 11/01/2023 0952   CL 99 11/01/2023 0952   CO2 26 11/01/2023 0952   GLUCOSE 161 (H) 11/01/2023 0952   BUN 11 11/01/2023 0952   BUN 17 07/25/2023 1239   CREATININE 0.77 11/01/2023 0952   CREATININE 1.15 05/24/2023 1033   CALCIUM  9.0 11/01/2023 0952   PROT 5.7 (L) 10/22/2023 0215   ALBUMIN 3.4 (L) 10/22/2023 0215   AST 20 10/22/2023 0215   ALT 18 10/22/2023 0215   ALKPHOS 42 10/22/2023 0215   BILITOT 0.7 10/22/2023 0215   EGFR 92 07/25/2023 1239   GFRNONAA >60 11/01/2023 0952   GFRNONAA >60 05/24/2023 1033    Radiographic Findings: CT Soft Tissue Neck W Contrast Result Date: 11/04/2023 CLINICAL DATA:  Pyriform sinus cancer * Tracking Code: BO * EXAM: CT NECK WITH CONTRAST  CT CHEST WITH CONTRAST TECHNIQUE: Multidetector CT imaging of the neck and chest was performed during intravenous contrast administration. RADIATION DOSE REDUCTION: This exam was performed according to the departmental dose-optimization program which includes automated exposure control, adjustment of the mA and/or kV according to patient size and/or use of iterative reconstruction technique. CONTRAST:  75mL OMNIPAQUE  IOHEXOL  300 MG/ML  SOLN COMPARISON:  02/09/2023 FINDINGS: CT NECK FINDINGS Limited Intracranial and Visualized Orbits: Negative. Mastoids and Visualized Paranasal Sinuses: Clear. Pharynx and Larynx: Unchanged hypopharyngeal soft tissue thickening and loss of tissue definition in keeping with post treatment/post radiation change (series 3, image 70). No mass or edema. Salivary Glands: No inflammation, mass, or calculus. Thyroid : Normal. Lymph Nodes: No lymphadenopathy. Vascular: Bilateral carotid bulb atherosclerosis. Musculoskeletal: No acute osseous findings. CT CHEST FINDINGS Cardiovascular: Aortic atherosclerosis. Normal heart size. Three-vessel coronary artery calcifications. No pericardial effusion. Mediastinum/Nodes: No enlarged mediastinal, hilar, or axillary lymph nodes. Thyroid  gland, trachea, and esophagus demonstrate no significant findings. Lungs/Pleura: Lungs are clear. No pleural effusion or pneumothorax. Upper Abdomen: No acute abnormality. Musculoskeletal: No chest wall abnormality. No acute osseous findings. IMPRESSION: 1. Unchanged hypopharyngeal soft tissue thickening and loss of tissue definition in keeping with post treatment/post radiation change. No evidence of recurrent or metastatic disease. 2. No evidence of lymphadenopathy or metastatic disease in the neck or chest. 3. Coronary artery disease. Aortic Atherosclerosis (ICD10-I70.0). Electronically Signed   By: Marolyn JONETTA Jaksch M.D.   On: 11/04/2023 20:18   CT Chest W Contrast Result Date: 11/04/2023 CLINICAL DATA:  Pyriform  sinus cancer * Tracking Code: BO * EXAM: CT NECK WITH CONTRAST CT CHEST WITH CONTRAST TECHNIQUE: Multidetector CT imaging of the neck and chest was performed during intravenous contrast administration. RADIATION DOSE REDUCTION: This exam was performed according to the departmental dose-optimization program which includes automated exposure control, adjustment of the mA and/or kV according to patient size and/or use of iterative reconstruction technique. CONTRAST:  75mL OMNIPAQUE  IOHEXOL  300 MG/ML  SOLN COMPARISON:  02/09/2023 FINDINGS: CT NECK FINDINGS Limited Intracranial and Visualized Orbits: Negative. Mastoids and Visualized Paranasal Sinuses: Clear. Pharynx  and Larynx: Unchanged hypopharyngeal soft tissue thickening and loss of tissue definition in keeping with post treatment/post radiation change (series 3, image 70). No mass or edema. Salivary Glands: No inflammation, mass, or calculus. Thyroid : Normal. Lymph Nodes: No lymphadenopathy. Vascular: Bilateral carotid bulb atherosclerosis. Musculoskeletal: No acute osseous findings. CT CHEST FINDINGS Cardiovascular: Aortic atherosclerosis. Normal heart size. Three-vessel coronary artery calcifications. No pericardial effusion. Mediastinum/Nodes: No enlarged mediastinal, hilar, or axillary lymph nodes. Thyroid  gland, trachea, and esophagus demonstrate no significant findings. Lungs/Pleura: Lungs are clear. No pleural effusion or pneumothorax. Upper Abdomen: No acute abnormality. Musculoskeletal: No chest wall abnormality. No acute osseous findings. IMPRESSION: 1. Unchanged hypopharyngeal soft tissue thickening and loss of tissue definition in keeping with post treatment/post radiation change. No evidence of recurrent or metastatic disease. 2. No evidence of lymphadenopathy or metastatic disease in the neck or chest. 3. Coronary artery disease. Aortic Atherosclerosis (ICD10-I70.0). Electronically Signed   By: Marolyn JONETTA Jaksch M.D.   On: 11/04/2023 20:18   DG CHEST  PORT 1 VIEW Result Date: 10/23/2023 CLINICAL DATA:  Hypoxia EXAM: PORTABLE CHEST 1 VIEW COMPARISON:  Prior chest x-ray 10/20/2021 FINDINGS: Interval resolution of pulmonary edema compared to prior. Persistent mild chronic bronchitic changes. Cardiac and mediastinal contours are within normal limits. No pleural effusion or pneumothorax. No focal airspace infiltrate. No acute osseous abnormality. IMPRESSION: Interval resolution of mild pulmonary edema compared to 10/21/2023. Stable mild chronic bronchitic changes. No acute cardiopulmonary process. Electronically Signed   By: Wilkie Lent M.D.   On: 10/23/2023 09:27   ECHOCARDIOGRAM COMPLETE Result Date: 10/22/2023    ECHOCARDIOGRAM REPORT   Patient Name:   Randall [name] Date of Exam: 10/22/2023 Medical Rec #:  [id number]        Height:       70.0 in Accession #:    [id number]       Weight:       166.9 lb Date of Birth:  [birth year]        BSA:          1.933 m Patient Age:    68 years         BP:           187/96 mmHg Patient Gender: M                HR:           61 bpm. Exam Location:  Inpatient Procedure: 2D Echo, Cardiac Doppler and Color Doppler (Both Spectral and Color            Flow Doppler were utilized during procedure). Indications:    CHF-Acute Diastolic I50.31  History:        Patient has prior history of Echocardiogram examinations, most                 recent 06/12/2023. CHF, CAD, CKD, stage 2, Arrythmias:RBBB,                 Signs/Symptoms:Chest Pain; Risk Factors:Hypertension and                 Dyslipidemia.  Sonographer:    Thea Norlander RCS Referring Phys: SUBRINA SUNDIL IMPRESSIONS  1. Left ventricular ejection fraction, by estimation, is 45 to 50%. The left ventricle has mildly decreased function. The left ventricle demonstrates regional wall motion abnormalities (see scoring diagram/findings for description). There is mild concentric left ventricular hypertrophy. Left ventricular diastolic parameters are consistent with Grade II  diastolic dysfunction (pseudonormalization). There is moderate hypokinesis of  the left ventricular, basal-mid inferior wall and inferolateral wall.  2. Right ventricular systolic function is normal. The right ventricular size is normal.  3. Left atrial size was mild to moderately dilated.  4. The mitral valve is normal in structure. Mild to moderate mitral valve regurgitation. No evidence of mitral stenosis.  5. The aortic valve is tricuspid. There is mild calcification of the aortic valve. There is mild thickening of the aortic valve. Aortic valve regurgitation is not visualized. Aortic valve sclerosis/calcification is present, without any evidence of aortic stenosis. Comparison(s): A prior study was performed on 06/12/2023. Prior images reviewed side by side. The left ventricular function is worsened. The left ventricular wall motion abnormalities are worse. (there is more extensive involvement of the inferior wall) However, LV function is overall quite similar to the 2022 report. FINDINGS  Left Ventricle: Left ventricular ejection fraction, by estimation, is 45 to 50%. The left ventricle has mildly decreased function. The left ventricle demonstrates regional wall motion abnormalities. Moderate hypokinesis of the left ventricular, basal-mid inferior wall and inferolateral wall. The left ventricular internal cavity size was normal in size. There is mild concentric left ventricular hypertrophy. Left ventricular diastolic parameters are consistent with Grade II diastolic dysfunction (pseudonormalization).  LV Wall Scoring: The inferior wall and posterior wall are hypokinetic. Right Ventricle: The right ventricular size is normal. No increase in right ventricular wall thickness. Right ventricular systolic function is normal. Left Atrium: Left atrial size was mild to moderately dilated. Right Atrium: Right atrial size was normal in size. Pericardium: There is no evidence of pericardial effusion. Mitral Valve: The  mitral valve is normal in structure. Mild to moderate mitral valve regurgitation. No evidence of mitral valve stenosis. Tricuspid Valve: The tricuspid valve is normal in structure. Tricuspid valve regurgitation is not demonstrated. Aortic Valve: The aortic valve is tricuspid. There is mild calcification of the aortic valve. There is mild thickening of the aortic valve. Aortic valve regurgitation is not visualized. Aortic valve sclerosis/calcification is present, without any evidence of aortic stenosis. Aortic valve mean gradient measures 3.9 mmHg. Aortic valve peak gradient measures 8.0 mmHg. Aortic valve area, by VTI measures 3.32 cm. Pulmonic Valve: The pulmonic valve was grossly normal. Pulmonic valve regurgitation is trivial. No evidence of pulmonic stenosis. Aorta: The aortic root and ascending aorta are structurally normal, with no evidence of dilitation. IAS/Shunts: No atrial level shunt detected by color flow Doppler.  LEFT VENTRICLE PLAX 2D LVIDd:         4.60 cm   Diastology LVIDs:         3.40 cm   LV e' medial:    7.29 cm/s LV PW:         1.20 cm   LV E/e' medial:  13.9 LV IVS:        1.20 cm   LV e' lateral:   8.27 cm/s LVOT diam:     2.09 cm   LV E/e' lateral: 12.2 LV SV:         86 LV SV Index:   45 LVOT Area:     3.43 cm  RIGHT VENTRICLE             IVC RV S prime:     12.80 cm/s  IVC diam: 2.70 cm TAPSE (M-mode): 2.0 cm LEFT ATRIUM             Index        RIGHT ATRIUM           Index  LA diam:        3.80 cm 1.97 cm/m   RA Area:     16.40 cm LA Vol (A2C):   67.3 ml 34.82 ml/m  RA Volume:   44.30 ml  22.92 ml/m LA Vol (A4C):   55.5 ml 28.72 ml/m LA Biplane Vol: 61.0 ml 31.56 ml/m  AORTIC VALVE AV Area (Vmax):    2.99 cm AV Area (Vmean):   3.16 cm AV Area (VTI):     3.32 cm AV Vmax:           141.17 cm/s AV Vmean:          90.871 cm/s AV VTI:            0.259 m AV Peak Grad:      8.0 mmHg AV Mean Grad:      3.9 mmHg LVOT Vmax:         123.15 cm/s LVOT Vmean:        83.828 cm/s LVOT VTI:           0.251 m LVOT/AV VTI ratio: 0.97  AORTA Ao Root diam: 3.50 cm Ao Asc diam:  3.40 cm MITRAL VALVE                TRICUSPID VALVE MV Area (PHT): 3.77 cm     TR Peak grad:   35721.0 mmHg MV Decel Time: 201 msec     TR Vmax:        9450.00 cm/s MV E velocity: 101.00 cm/s MV A velocity: 53.40 cm/s   SHUNTS MV E/A ratio:  1.89         Systemic VTI:  0.25 m                             Systemic Diam: 2.09 cm Jerel Croitoru MD Electronically signed by Jerel Balding MD Signature Date/Time: 10/22/2023/2:56:55 PM    Final    CT Angio Chest PE W and/or Wo Contrast Result Date: 10/21/2023 CLINICAL DATA:  Pulmonary embolism (PE) suspected, low to intermediate prob, neg D-dimer. Shortness of breath EXAM: CT ANGIOGRAPHY CHEST WITH CONTRAST TECHNIQUE: Multidetector CT imaging of the chest was performed using the standard protocol during bolus administration of intravenous contrast. Multiplanar CT image reconstructions and MIPs were obtained to evaluate the vascular anatomy. RADIATION DOSE REDUCTION: This exam was performed according to the departmental dose-optimization program which includes automated exposure control, adjustment of the mA and/or kV according to patient size and/or use of iterative reconstruction technique. CONTRAST:  75mL OMNIPAQUE  IOHEXOL  350 MG/ML SOLN COMPARISON:  02/09/2023 FINDINGS: Cardiovascular: Heart is normal size. Aorta is normal caliber. Moderate coronary artery and aortic atherosclerosis. No filling defects in the pulmonary arteries to suggest pulmonary emboli. Mediastinum/Nodes: No mediastinal, hilar, or axillary adenopathy. Trachea and esophagus are unremarkable. Thyroid  unremarkable. Lungs/Pleura: Moderate to large bilateral pleural effusions. Mild vascular congestion. Interstitial prominence and ground-glass perihilar opacities suggest edema. Upper Abdomen: No acute findings Musculoskeletal: Chest wall soft tissues are unremarkable. No acute bony abnormality. Review of the MIP images  confirms the above findings. IMPRESSION: No evidence of pulmonary embolus. Moderate to large bilateral pleural effusions. Suspect mild interstitial edema. Coronary artery disease. Aortic Atherosclerosis (ICD10-I70.0). Electronically Signed   By: Franky Crease M.D.   On: 10/21/2023 19:48   DG Chest 2 View Result Date: 10/21/2023 CLINICAL DATA:  Shortness of breath. EXAM: CHEST - 2 VIEW COMPARISON:  August 31, 2023 FINDINGS: The heart size and  mediastinal contours are within normal limits. Moderate severity diffusely increased interstitial lung markings are seen. Mild atelectatic changes are noted within the bilateral lung bases. No pleural effusion or pneumothorax is identified. The visualized skeletal structures are unremarkable. IMPRESSION: Moderate severity interstitial edema with mild bibasilar atelectasis. Electronically Signed   By: Suzen Dials M.D.   On: 10/21/2023 16:45    Impression/Plan:  Stage IV (pT2, pN2a, cM0) poorly differentiated squamous cell carcinoma; s/p CO2 laser excision of the hypopharyngeal mass and bilateral neck dissection with one positive LN and adjuvant radiation treatment  Hypopharyngeal cancer in remission Cancer remains in remission with recent CT scans showing no recurrence. - Schedule follow-up in six months for physical exam and CT scan of neck and chest with contrast. - Avoid PET scan unless future CT scans show abnormalities. - ENT to scope him in mid Dec  Lymphedema of face and neck Persistent lymphedema managed with a lymphedema pump twice daily, effectively controlling symptoms. However, neck weakness persists  Chronic right facial, jaw, and neck pain Managed through activity and self-directed physical therapy.  Pain medication management per ENT  Oral mucosal ulcers Recent oral mucosal ulcers causing significant discomfort. - Manage with Magic Mouthwash containing lidocaine , Benadryl , and Maalox to alleviate pain and allow for eating. - no  concerning lesions on exam today  Chronic sinus symptoms with epistaxis Chronic sinus symptoms with episodes of epistaxis. Previous ENT evaluation showed clear sinuses,  CT scan reassuring, but symptoms persist. - Discontinued Flonase due to causing epistaxis. Consult with ENT for advice.   Nutritional Status: no issues. Weight fluctuates with fluid overload issues PEG tube: None Wt Readings from Last 3 Encounters:  11/07/23 167 lb 6.4 oz (75.9 kg)  11/01/23 173 lb 6.4 oz (78.7 kg)  10/24/23 153 lb (69.4 kg)    Risk Factors: The patient has been educated about risk factors including alcohol and tobacco abuse; they understand that avoidance of alcohol and tobacco is important to prevent recurrences as well as other cancers  Dental: Encouraged to continue regular followup with dentistry, and dental hygiene including fluoride treatments.     Thyroid  function: Check annually.  Lab Results  Component Value Date   TSH 1.860 06/11/2023    Patient will follow-up with Dr. Lauralee in Dec for scoping. Discussed options for future imaging: We will order CT of the neck and chest with contrast to be completed in another 6 mo as he desires close follow-up and I will see the patient in clinic to review the results. He was encouraged to call with any questions or concerns in the meantime.    On date of service, in total, we spent 40 minutes on this encounter. Patient was seen in person.  Andrea Plunk, RN, was shadowing during this visit. Note signed after encounter date; minutes pertain to date of service, only.  _____________________________________ -----------------------------------  Lauraine Golden, MD

## 2023-11-09 ENCOUNTER — Other Ambulatory Visit: Payer: Self-pay

## 2023-11-09 DIAGNOSIS — C12 Malignant neoplasm of pyriform sinus: Secondary | ICD-10-CM

## 2023-11-10 ENCOUNTER — Encounter: Payer: Self-pay | Admitting: Emergency Medicine

## 2023-11-10 ENCOUNTER — Ambulatory Visit: Attending: Emergency Medicine | Admitting: Emergency Medicine

## 2023-11-10 VITALS — BP 142/72 | HR 75 | Ht 70.0 in | Wt 178.0 lb

## 2023-11-10 DIAGNOSIS — I10 Essential (primary) hypertension: Secondary | ICD-10-CM

## 2023-11-10 DIAGNOSIS — I2510 Atherosclerotic heart disease of native coronary artery without angina pectoris: Secondary | ICD-10-CM | POA: Diagnosis not present

## 2023-11-10 DIAGNOSIS — I5020 Unspecified systolic (congestive) heart failure: Secondary | ICD-10-CM

## 2023-11-10 DIAGNOSIS — E118 Type 2 diabetes mellitus with unspecified complications: Secondary | ICD-10-CM

## 2023-11-10 DIAGNOSIS — E871 Hypo-osmolality and hyponatremia: Secondary | ICD-10-CM

## 2023-11-10 DIAGNOSIS — Z79899 Other long term (current) drug therapy: Secondary | ICD-10-CM | POA: Diagnosis not present

## 2023-11-10 DIAGNOSIS — E785 Hyperlipidemia, unspecified: Secondary | ICD-10-CM

## 2023-11-10 MED ORDER — POTASSIUM CHLORIDE CRYS ER 20 MEQ PO TBCR: 20.0000 meq | EXTENDED_RELEASE_TABLET | Freq: Once | ORAL | 1 refills | Status: DC

## 2023-11-10 MED ORDER — SACUBITRIL-VALSARTAN 49-51 MG PO TABS: 1.0000 | ORAL_TABLET | Freq: Two times a day (BID) | ORAL | 1 refills | Status: DC

## 2023-11-10 NOTE — Patient Instructions (Addendum)
 Medication Instructions:  STOP TAKING SPIRONOLACTONE. START BACK TAKING ENTRESTO 49/51 MG TWICE DAILY. START TAKING POTASSIUM CHLORIDE  20 MEQ DAILY.  Lab Work: BMET TO BE DONE TODAY.  Testing/Procedures: NONE  Follow-Up: At [facility], you and your health needs are our priority.  As part of our continuing mission to provide you with exceptional heart care, our providers are all part of one team.  This team includes your primary Cardiologist (physician) and Advanced Practice Providers or APPs (Physician Assistants and Nurse Practitioners) who all work together to provide you with the care you need, when you need it.  Your next appointment:   1 MONTH  Provider:   MADISON FOUNTAIN, NP

## 2023-11-10 NOTE — Progress Notes (Signed)
 Cardiology Office Note:    Date:  11/10/2023  ID:  [name], DOB 1955-08-19, MRN [id number] PCP: Lynwood Laneta LELON DEVONNA  [facility] Providers Cardiologist:  Alm Clay, MD Cardiology APP:  Rana Lum CROME, NP       Patient Profile:       Chief Complaint: 1 week follow-up History of Present Illness:  Randall [name] is a 68 y.o. male with visit-pertinent history of resistant hypertension, chronic diastolic heart failure, HLD, elevated coronary artery calcium  score, thoracic aortic atherosclerosis, right bundle branch block, T2DM, laryngeal cancer   He established care with cardiology service in November 2022 for abnormal EKG and hypertension management.  He was on 3 antihypertensive agents with persistently elevated blood pressures and his EKG showed right bundle branch block.  Echocardiogram was ordered and completed 12/08/2020 showing LVEF 45-50%, global hypokinesis, grade 1 DD, mild mitral valve regurgitation, mild calcification of the aortic valve.  Decreased LV function was thought to be in the setting of uncontrolled HTN.  Bundle branch block thought to be related to wall motion abnormality.  CT cardiac scoring completed on 02/02/2021 showing coronary calcium  score 683, 87 percentile for age, race, sex and over read interpretation CT chest showing aortic atherosclerosis.   Seen in clinic on 02/18/2022 and was without any active cardiovascular symptoms.  His blood pressure was still borderline high and his chlorthalidone  was titrated up to 25 mg.  He was on combination of amlodipine  10 mg, carvedilol  25 mg twice daily, telmisartan 80 mg daily, chlorthalidone  25 mg daily.   Seen in clinic on 01/12/2023.  Noted to experience significant increase in his chronic lower extremity edema.  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  His Lasix  was increased to 40 mg daily.  Echocardiogram ordered showing LVEF 55-60%, grade 2 DD, RV SF normal,  RV moderately enlarged, left atrial size severely dilated, no valvular abnormalities   Patient was recently admitted on 06/10/2023 for chest pain.  He presented with 3 days of left arm discomfort with associated midsternal chest pain. ECG with RBBB/LAFB which is intermittent from 09/25/20 with some ECG with normal conduction and others with 1' AVB, RBBB and LAFB. No ischemic changes.  He underwent cardiac catheterization on 06/12/2023 showing moderate single-vessel disease with 60% RPDA stenosis with normal LVEF and normal EDP.  Echocardiogram 06/12/2023 showed LVEF 55 to 60%, inferolateral hypokinesis, grade 2 DD, RV function size normal, mild mitral valve regurgitation.  Chlorthalidone  was discontinued and he was started on amlodipine  due to hyponatremia.   He was last seen in clinic on 06/26/2023.  His chest pains had entirely resolved and feels much improved.  He reported that he engages in extensive physical activities for 16 to 20 hours a day, 7 days a week.  His physical activity coupled with poor sleep has contributed to his exhaustion while managing his wife's poor health and medical appointments.  He had been experiencing leg swelling particularly after discontinuation of chlorthalidone  and furosemide  in the hospital.  He had self-started taking his prior 20 mg of Lasix  as well as his chlorthalidone  due to fluid retention.  His chlorthalidone  was discontinued once more during office visit due to his history of hyponatremia.  He was to continue Lasix  as needed.  He was currently drinking 220 ml of fluid due to dry mouth s/p history of radiation therapy.  I have asked him to decrease his fluid intake due to possible sodium dilution.  Follow-up labs showed  normalization in his electrolytes after decreasing his fluid intake.  He was last seen by general cardiology on 09/26/2023.  Reported his leg swelling had significantly improved with reduction of fluid he was currently drinking.  Previously drinking 220 mL  daily and now drinking 60-80 mg daily.  Blood pressure was well-controlled.  He is continue current medication regimen.  He was admitted on 10/2023 with acute on chronic heart failure.  He was diuresed almost 10 mL with IV Lasix .  Echocardiogram showed LVEF 45 to 50%, RWMA, grade 2 DD, RV normal, LA mildly dilated, mild-moderate MR.  He was subsequent started on spironolactone and Entresto.  He followed up with TOC clinic on 11/01/2023.  He was doing well with NYHA I-II symptoms.  Reported to have hives after hospitalization which seem to have improved with prednisone taper.  No medication changes were made.  He called to office on 11/06/2023 noted ongoing hives/itching.  Entresto was discontinued at that time.   Discussed the use of AI scribe software for clinical note transcription with the patient, who gave verbal consent to proceed.  History of Present Illness Randall [name] is a 68 year old male with heart failure who presents for follow-up.  Today patient is doing well.  After starting Entresto and spironolactone, he developed hives and severe pruritus, leading to discontinuation of Entresto four days ago.  However intermittent pruritus persists, especially on the head and face in the evenings, with no relief from over-the-counter medications like Benadryl .  He continues spironolactone but suspects it may contribute to his pruritus. He uses a lymphedema pump for facial swelling with some improvement. He takes furosemide  40 mg daily and monitors fluid intake and output. He notes a five-pound weight gain over the past week, attributed to fluid retention.  He denies any dyspnea, orthopnea, PND.  Lower extremity swelling well-controlled on lymphedema pumps. He is concerned about the impact of medication changes on potassium levels.   Review of systems:  Please see the history of present illness. All other systems are reviewed and otherwise negative.      Studies Reviewed:         Echocardiogram 06/12/2023 1. Left ventricular ejection fraction, by estimation, is 55 to 60%. Left  ventricular ejection fraction by 2D MOD biplane is 57.6 %. The left  ventricle has normal function. The left ventricle demonstrates regional  wall motion abnormalities with  inferolateral hypokinesis. Left ventricular diastolic parameters are  consistent with Grade II diastolic dysfunction (pseudonormalization).   2. Right ventricular systolic function is normal. The right ventricular  size is normal. Tricuspid regurgitation signal is inadequate for assessing  PA pressure.   3. Left atrial size was mildly dilated.   4. The mitral valve is normal in structure. Mild mitral valve  regurgitation. No evidence of mitral stenosis.   5. The aortic valve is tricuspid. There is mild calcification of the  aortic valve. Aortic valve regurgitation is not visualized. No aortic  stenosis is present.   6. The inferior vena cava is dilated in size with <50% respiratory  variability, suggesting right atrial pressure of 15 mmHg.      Cardiac catheterization 06/12/2023 POST-OPERATIVE DIAGNOSIS:   Moderate single-vessel disease with 60% RPDA stenosis and a codominant system. Normal LVEF with normal EDP   RECOMMENDATIONS:     In the absence of any other complications or medical issues, we expect the patient to be ready for discharge from a cath perspective on 06/12/2023.   Recommend Aspirin  81mg  daily  for moderate CAD. Diagnostic Dominance: Co-dominant   Risk Assessment/Calculations:             Physical Exam:   VS:  BP (!) 142/72   Pulse 75   Ht 5' 10 (1.778 m)   Wt 178 lb (80.7 kg)   SpO2 99%   BMI 25.54 kg/m    Wt Readings from Last 3 Encounters:  11/10/23 178 lb (80.7 kg)  11/07/23 167 lb 6.4 oz (75.9 kg)  11/01/23 173 lb 6.4 oz (78.7 kg)    GEN: Well nourished, well developed in no acute distress NECK: No JVD; No carotid bruits CARDIAC: RRR, no murmurs, rubs, gallops RESPIRATORY:   Clear to auscultation without rales, wheezing or rhonchi  ABDOMEN: Soft, non-tender, non-distended EXTREMITIES:  No edema; No acute deformity      Assessment and Plan:  Coronary artery disease LHC 06/2023 with moderate single-vessel disease with 60% RPDA stenosis and a codominant system, normal LVEF and EDP Echocardiogram 06/2023 with LVEF 55 to 60% - Today he is stable without chest pains.  He remains active without exertional symptoms.  No symptoms to suggest active angina, no indication for ischemic evaluation at this time - Continue aspirin  81 mg daily, atorvastatin  80 mg daily, carvedilol  25 mg twice daily   Chronic HFmrEF Echocardiogram 06/2023 with LVEF 55 to 60%, inferolateral hypokinesis, grade 2DD Admitted 10/2023 with acute on chronic HF exacerbation and diuresed nearly 10L Echocardiogram 10/2023 with LVEF 45 to 50%, mild LVH, grade 2 DD, RV normal - NYHA class II - He had noted 5 pound weight gain over the past week.  Weight down to 153 pounds at discharge, 173 pounds last week at [facility] visit, and up to 178 pounds today.  Denies any symptoms such as dyspnea, orthopnea, PND, LEE - Did see improvement in LEE and hyponatremia when he decreased his water intake from 220 mL daily to 60-80 mL daily.  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  Entresto was discontinued approximately 5 days ago without any improvement in symptoms.  Symptoms did not resolve with prednisone taper - Plan to reinitiate Entresto 49-51 mg twice daily and discontinue spironolactone given intense pruritus - Given recent 5 pound weight gain I will have him increase his furosemide  to 40 mg twice daily x 2 days then resume 40 mg daily - With discontinuation of spironolactone and ongoing furosemide  use I will start him back on 20 mEq potassium supplement - Continue carvedilol  25 mg twice daily   - Can consider adding SGLT2i at follow-up visit  - He will message me via MyChart if pruritus improves off of spironolactone - I have asked him to decrease his ensure supplement from 3 times a day to twice daily - BMET today  Hypertension Blood pressure today is elevated at 142/72 - Continue HF GDMT as further noted above - Maintain home BP log and bring into next office visit   Hyperlipidemia, LDL goal <70 LDL 54 on 06/2023 and well-controlled Lipoprotein (A) 101.1 on 06/2023 - Continue atorvastatin  80 mg daily   T2DM A1c 5.1% on 10/2023 and well-controlled - Continue current medical therapy with Trulicity - Managed by PCP   Lymphedema 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  Possible sodium dilution due to drinking 220 mL daily due to dry mouth      Dispo:  Return in about 1 month (around 12/10/2023).  Signed, Lum LITTIE Louis, NP

## 2023-11-11 LAB — BASIC METABOLIC PANEL WITH GFR
BUN/Creatinine Ratio: 21 (ref 10–24)
BUN: 18 mg/dL (ref 8–27)
CO2: 26 mmol/L (ref 20–29)
Calcium: 9.1 mg/dL (ref 8.6–10.2)
Chloride: 100 mmol/L (ref 96–106)
Creatinine, Ser: 0.86 mg/dL (ref 0.76–1.27)
Glucose: 93 mg/dL (ref 70–99)
Potassium: 4.4 mmol/L (ref 3.5–5.2)
Sodium: 138 mmol/L (ref 134–144)
eGFR: 95 mL/min/1.73 (ref >59)

## 2023-11-13 ENCOUNTER — Ambulatory Visit: Payer: Self-pay | Admitting: Emergency Medicine

## 2023-11-13 ENCOUNTER — Encounter: Payer: Self-pay | Admitting: Radiology

## 2023-11-14 ENCOUNTER — Encounter (HOSPITAL_BASED_OUTPATIENT_CLINIC_OR_DEPARTMENT_OTHER): Payer: Self-pay

## 2023-11-14 ENCOUNTER — Emergency Department (HOSPITAL_BASED_OUTPATIENT_CLINIC_OR_DEPARTMENT_OTHER)
Admission: EM | Admit: 2023-11-14 | Discharge: 2023-11-14 | Disposition: A | Discharge status: Home / Self Care | Attending: Emergency Medicine | Admitting: Emergency Medicine

## 2023-11-14 ENCOUNTER — Other Ambulatory Visit: Payer: Self-pay

## 2023-11-14 ENCOUNTER — Other Ambulatory Visit (HOSPITAL_BASED_OUTPATIENT_CLINIC_OR_DEPARTMENT_OTHER): Payer: Self-pay

## 2023-11-14 DIAGNOSIS — Z79899 Other long term (current) drug therapy: Secondary | ICD-10-CM | POA: Insufficient documentation

## 2023-11-14 DIAGNOSIS — Z7982 Long term (current) use of aspirin: Secondary | ICD-10-CM | POA: Diagnosis not present

## 2023-11-14 DIAGNOSIS — I509 Heart failure, unspecified: Secondary | ICD-10-CM | POA: Diagnosis not present

## 2023-11-14 DIAGNOSIS — E1122 Type 2 diabetes mellitus with diabetic chronic kidney disease: Secondary | ICD-10-CM | POA: Insufficient documentation

## 2023-11-14 DIAGNOSIS — N189 Chronic kidney disease, unspecified: Secondary | ICD-10-CM | POA: Diagnosis not present

## 2023-11-14 DIAGNOSIS — Z91040 Latex allergy status: Secondary | ICD-10-CM | POA: Diagnosis not present

## 2023-11-14 DIAGNOSIS — R21 Rash and other nonspecific skin eruption: Secondary | ICD-10-CM | POA: Diagnosis present

## 2023-11-14 DIAGNOSIS — L237 Allergic contact dermatitis due to plants, except food: Secondary | ICD-10-CM | POA: Diagnosis not present

## 2023-11-14 DIAGNOSIS — I130 Hypertensive heart and chronic kidney disease with heart failure and stage 1 through stage 4 chronic kidney disease, or unspecified chronic kidney disease: Secondary | ICD-10-CM | POA: Diagnosis not present

## 2023-11-14 MED ORDER — PREDNISONE 20 MG PO TABS: ORAL_TABLET | ORAL | 0 refills | Status: DC

## 2023-11-14 MED ORDER — PREDNISONE 20 MG PO TABS
ORAL_TABLET | ORAL | 0 refills | Status: DC
  Filled 2023-11-14: qty 19, 13d supply, fill #0

## 2023-11-14 MED ORDER — PREDNISONE 50 MG PO TABS
60.0000 mg | ORAL_TABLET | Freq: Once | ORAL | Status: AC
  Administered 2023-11-14: 60 mg via ORAL
  Filled 2023-11-14: qty 1

## 2023-11-14 NOTE — Discharge Instructions (Signed)
 As discussed, you will need to have close follow-up with your primary care provider (especially given your history of diabetes and the fact that steroids tend to cause increase in blood sugar levels).  Please keep a close eye on your blood sugar levels at home.  Please see your primary care provider in the next 7 days.  If rash does not resolve with oral steroids, you will need to follow-up with a dermatologist for a skin biopsy.  Seek emergency care if experiencing any new or worsening symptoms.

## 2023-11-14 NOTE — ED Triage Notes (Signed)
 Wound that itching to left forearm. Reports that a dead limb fell out of his neighbor's yard and hit his arm on Saturday. He has cleaned it, used benadryl , poison ivy cream, and Hydrocortisone cream with worsening itching.

## 2023-11-14 NOTE — ED Provider Notes (Signed)
 [place] EMERGENCY DEPARTMENT AT [facility] Provider Note   CSN: [id number] Arrival date & time: 11/14/23  0945     Patient presents with: Wound Check   Randall [name] is a 67 y.o. male with PMHx DM, HF, CKD, GERD, HTN who presents to ED concern for rash on his right arm.  Rash started developing after a broken limb fell and hit his arm last Saturday.  Patient stating that the rash is itchy severely.  Patient has tried oral Benadryl , steroid cream, and poison ivy cream -but rash continues to severely itch.  Patient denies any other acute infectious symptoms today.  Denies new medicines, lotions, detergents.    Wound Check       Prior to Admission medications   Medication Sig Start Date End Date Taking? Authorizing Provider  predniSONE (DELTASONE) 20 MG tablet Take 2 tablets (40 mg total) by mouth daily for 6 days, THEN 1 tablet (20 mg total) daily for 7 days. 11/14/23 11/27/23 Yes Racine Erby F, PA-C  PREVIDENT 5000 ENAMEL PROTECT 1.1-5 % GEL Place 1 Application onto teeth 2 (two) times daily. *morning and bedtime* 11/10/23  Yes [provider]  aspirin  EC 81 MG tablet Take 81 mg by mouth daily. Swallow whole.    [provider]  atorvastatin  (LIPITOR) 80 MG tablet Take 1 tablet (80 mg total) by mouth daily. 06/13/23   Wouk, Devaughn Sayres, MD  carvedilol  (COREG ) 25 MG tablet Take 25 mg by mouth in the morning and at bedtime.    [provider]  chlorhexidine  (PERIDEX ) 0.12 % solution Use as directed 10 mLs in the mouth or throat 2 (two) times daily.    [provider]  Continuous Glucose Sensor (FREESTYLE LIBRE 3 PLUS SENSOR) MISC REPLACE SENSOR EVERY 2 WEEKS - DIAGNOSIS TYPE 2 DM    [provider]  docusate sodium  (COLACE) 100 MG capsule Take 100 mg by mouth in the morning, at noon, and at bedtime.    [provider]  Dulaglutide (TRULICITY [place]) Inject 0.5 mg into the skin once a week. Thursday    [provider]  fluticasone (FLONASE) 50 MCG/ACT nasal spray Place 1 spray into both nostrils daily for 3 days. 10/25/23 11/23/23  Fairy Frames, MD  furosemide  (LASIX ) 40 MG tablet TAKE 1 TABLET BY MOUTH EVERY DAY 11/01/23   Anner Alm ORN, MD  geriatric multivitamins-minerals (ELDERTONIC/GEVRABON) LIQD Take 15 mLs by mouth daily.    [provider]  HYDROcodone -acetaminophen  (HYCET) 7.5-325 mg/15 ml solution Take 10 mL up to TID as needed for pain - not to exceed 30mL daily. 02/27/23   Izell Domino, MD  levocetirizine (XYZAL) 5 MG tablet TAKE 1 TABLET BY MOUTH EVERY DAY IN THE EVENING 10/30/23   Hedges, Reyes, PA-C  loperamide  (IMODIUM  A-D) 2 MG tablet Take 2 mg by mouth in the morning, at noon, and at bedtime.    [provider]  LORazepam  (ATIVAN ) 0.5 MG tablet TAKE 1 TABLET (0.5 MG TOTAL) BY MOUTH EVERY 8 (EIGHT) HOURS. TAKE AS NEEDED FOR AVERSION TO FEEDINGS    [provider]  magic mouthwash SOLN Take 10 mLs by mouth 4 (four) times daily. Suspension contains equal amounts of Maalox Extra Strength, nystatin, and diphenhydramine .    [provider]  Multiple Vitamin (MULTIVITAMIN PO) Take 1 tablet by mouth daily.    [provider]  ondansetron  (ZOFRAN ) 8 MG tablet Take 8 mg by mouth in the morning, at noon, and at bedtime.  [provider]  OVER THE COUNTER MEDICATION Take 30 mLs by mouth every 6 (six) hours as needed (Pain). PainQuil    [provider]  OVER THE COUNTER MEDICATION Take 2 tablets by mouth in the morning and at bedtime. [name]    [provider]  pantoprazole  (PROTONIX ) 40 MG tablet Take 40 mg by mouth daily.    [provider]  polyethylene glycol (MIRALAX / GLYCOLAX) 17 g packet Take 17 g by mouth daily as needed for moderate constipation.    [provider]  potassium chloride  SA (KLOR-CON  M) 20 MEQ tablet Take 1 tablet (20 mEq total) by mouth once for 1 dose. 11/10/23  11/10/23  Rana Lum CROME, NP  sacubitril-valsartan (ENTRESTO) 49-51 MG Take 1 tablet by mouth 2 (two) times daily. 11/10/23   Rana Lum CROME, NP  Sod Fluoride-Potassium Nitrate (PREVIDENT 5000 ENAMEL PROTECT DT) Place 1 application  onto teeth in the morning and at bedtime.    [provider]  solifenacin (VESICARE) 10 MG tablet Take 10 mg by mouth daily.    [provider]  tadalafil (CIALIS) 20 MG tablet Take 20 mg by mouth daily as needed for erectile dysfunction. 07/27/20   [provider]    Allergies: Norgesic forte [orphenadrine-aspirin -caffeine] and Latex    Review of Systems  Skin:  Positive for rash.    Updated Vital Signs BP 132/64   Pulse 71   Temp 97.9 F (36.6 C) (Oral)   Resp 20   SpO2 99%   Physical Exam Vitals and nursing note reviewed.  Constitutional:      General: He is not in acute distress.    Appearance: He is not ill-appearing or toxic-appearing.  HENT:     Head: Normocephalic and atraumatic.  Eyes:     General: No scleral icterus.       Right eye: No discharge.        Left eye: No discharge.     Conjunctiva/sclera: Conjunctivae normal.  Cardiovascular:     Rate and Rhythm: Normal rate.  Pulmonary:     Effort: Pulmonary effort is normal.  Abdominal:     General: Abdomen is flat.  Musculoskeletal:       Arms:  Skin:    General: Skin is warm and dry.     Comments: 6cmx4cm area of vesicular eruption and erythema on right forearm.  +2 radial pulse.  Sensation light touch intact.  Area nontense.  No purulence or swelling.  Neurological:     General: No focal deficit present.     Mental Status: He is alert. Mental status is at baseline.  Psychiatric:        Mood and Affect: Mood normal.        Behavior: Behavior normal.     (all labs ordered are listed, but only abnormal results are displayed) Labs Reviewed - No data to display  EKG: None  Radiology: No results found.   Procedures   Medications  Ordered in the ED  predniSONE (DELTASONE) tablet 60 mg (has no administration in time range)                                    Medical Decision Making  This patient presents to the ED for concern of rash, this involves an extensive number of treatment options, and is a complaint that carries with it a high risk of complications and  morbidity.  The differential diagnosis includes irritant contact dermatitis, DRESS, atopic dermatitis, anaphylaxis, SJS/TEN   Co morbidities that complicate the patient evaluation  DM, HF, CKD, GERD, HTN   Additional history obtained:  Dr. Lynwood PCP    Problem List / ED Course / Critical interventions / Medication management  Presents to ED concern for rash on right forearm.  Symptoms started after a dead tree branch fell on his right arm.  Patient denies any severe pain of his arm - stating that does not feel broken.  Active ROM intact.  Physical exam concerning for poison ivy/oak dermatitis.  Rest of physical exam reassuring.  Patient afebrile with stable vitals. Patient does have a history of T2DM.  It appears that he manages it well.  Patient stating that he was on a 5 day course of steroids around a month ago after having an allergic reaction to a new medication.  Patient stated that he tolerated prednisone well last month and is agreeable to start another course of prednisone to help with this poison ivy dermatitis.  Patient educated to continually check his blood sugars and have close follow-up with his PCP. Staffed with Dr. Lenor who agrees with plan. I have reviewed the patients home medicines and have made adjustments as needed The patient has been appropriately medically screened and/or stabilized in the ED. I have low suspicion for any other emergent medical condition which would require further screening, evaluation or treatment in the ED or require inpatient management. At time of discharge the patient is hemodynamically stable and in no acute  distress. I have discussed work-up results and diagnosis with patient and answered all questions. Patient is agreeable with discharge plan. We discussed strict return precautions for returning to the emergency department and they verbalized understanding.     Social Determinants of Health:  geriatric       Final diagnoses:  Poison ivy dermatitis    ED Discharge Orders          Ordered    predniSONE (DELTASONE) 20 MG tablet  Daily        11/14/23 1229               Hoy Nidia FALCON, [place] 11/14/23 1230    Lenor Hollering, MD 11/14/23 1521

## 2023-11-14 NOTE — ED Notes (Signed)

## 2023-11-22 ENCOUNTER — Other Ambulatory Visit (HOSPITAL_BASED_OUTPATIENT_CLINIC_OR_DEPARTMENT_OTHER): Payer: Self-pay

## 2023-11-22 ENCOUNTER — Other Ambulatory Visit: Payer: Self-pay | Admitting: Cardiology

## 2023-11-23 ENCOUNTER — Telehealth: Payer: Self-pay | Admitting: Cardiology

## 2023-11-23 MED ORDER — CARVEDILOL 25 MG PO TABS: 25.0000 mg | ORAL_TABLET | Freq: Two times a day (BID) | ORAL | 3 refills | Status: AC

## 2023-11-23 NOTE — Telephone Encounter (Signed)
 RX sent in

## 2023-11-23 NOTE — Telephone Encounter (Signed)
*  STAT* If patient is at the pharmacy, call can be transferred to refill team.   1. Which medications need to be refilled? (please list name of each medication and dose if known)   carvedilol  (COREG ) 25 MG tablet   2. Would you like to learn more about the convenience, safety, & potential cost savings by using the [facility]?   3. Are you open to using the [facility] (Type [facility]. ).  4. Which pharmacy/location (including street and city if local pharmacy) is medication to be sent to?  [id number] - [place], [place] - [address]   5. Do they need a 30 day or 90 day supply?   90 day   Patient stated he is completely out of this medication.

## 2023-11-27 ENCOUNTER — Other Ambulatory Visit (HOSPITAL_BASED_OUTPATIENT_CLINIC_OR_DEPARTMENT_OTHER): Payer: Self-pay

## 2023-11-27 MED ORDER — PREDNISONE 20 MG PO TABS
ORAL_TABLET | ORAL | 0 refills | Status: DC
  Filled 2023-11-27: qty 19, 14d supply, fill #0

## 2023-11-27 MED ORDER — TRIAMCINOLONE ACETONIDE 0.5 % EX CREA
TOPICAL_CREAM | CUTANEOUS | 0 refills | Status: AC
Start: 2023-11-27 — End: ?
  Filled 2023-11-27: qty 15, 30d supply, fill #0

## 2023-11-28 ENCOUNTER — Telehealth (HOSPITAL_COMMUNITY): Payer: Self-pay

## 2023-11-28 NOTE — Progress Notes (Signed)
 HEART & VASCULAR TRANSITION OF CARE PROGRESS NOTE     Referring Physician: Dr. Fairy PCP: Lynwood Laneta ORN, PA-C  Cardiologist: Alm Clay, MD  Chief Complaint: Chronic mid-range CHF  HPI: Referred to clinic by Dr. Fairy for heart failure consultation.   Randall [name] is a 68 y.o. male with chronic mid-range HF, HTN, HLD, RBBB and laryngeal and pyriform sinus cancer. Followed by Dr. Clay.   Admitted 5/25 with chest pain. Underwent LHC 6/25 which showed mod single vessel disease with 60% RPDA stenosis. Normal EF and normal EDP. Echo then with EF 55-60%, inferolateral HK, GIIDD, normal RV, mild MR.   Admitted 10/25 with A/C mid-range EF. Diuresed almost 10L with IV lasix . Echo 10/25 EF 45-50%, LV with RWMA, GIIDD, RV normal, LA mod dilated, mild-mod MR. Started on GDMT.   Recently went to urgent care for hives. (New meds at d/c were Spiro and Entresto). He was instructed to stop Entresto and he was started on prednisone taper. Rash did not go away w/ discontinuation of Entresto. This was restarted by cardiology and spironolactone was discontinued.   He presents today for f/u. Rash has resolved. Tolerating Entresto ok. BP elevated 182/64. ReDs also elevated at 41%. Wt has also been trending up. Denies resting dyspnea. Reports NYHA Class II symptoms. No chest pain.      Past Medical History:  Diagnosis Date   Arthritis    CHF (congestive heart failure) (HCC)    CKD (chronic kidney disease) stage 2, GFR 60-89 ml/min    Complication of anesthesia    Diabetes mellitus without complication (HCC)    Elevated coronary artery calcium  score 02/02/2021   GERD (gastroesophageal reflux disease)    History of kidney stones 2014   Hypertension    Kidney stone 06/17/2015   Laryngeal mass    squamous carcinoid cancer   Lymphedema    PONV (postoperative nausea and vomiting)    Rupture of right quadriceps tendon 09/21/2020   - s/p srugical repair   Squamous cell carcinoma of  neck    Thoracic aortic atherosclerosis    Tuberculosis 1980   Type 2 diabetes mellitus without complication, without long-term current use of insulin  (HCC) 03/21/2020    Current Outpatient Medications  Medication Sig Dispense Refill   aspirin  EC 81 MG tablet Take 81 mg by mouth daily. Swallow whole.     atorvastatin  (LIPITOR) 80 MG tablet Take 1 tablet (80 mg total) by mouth daily. 90 tablet 1   carvedilol  (COREG ) 25 MG tablet TAKE 1 TABLET BY MOUTH EVERY DAY IN THE MORNING AND IN THE EVENING WITH MEALS 180 tablet 3   carvedilol  (COREG ) 25 MG tablet Take 1 tablet (25 mg total) by mouth in the morning and at bedtime. 90 tablet 3   chlorhexidine  (PERIDEX ) 0.12 % solution Use as directed 10 mLs in the mouth or throat 2 (two) times daily.     Continuous Glucose Sensor (FREESTYLE LIBRE 3 PLUS SENSOR) MISC REPLACE SENSOR EVERY 2 WEEKS - DIAGNOSIS TYPE 2 DM     docusate sodium  (COLACE) 100 MG capsule Take 100 mg by mouth in the morning, at noon, and at bedtime.     Dulaglutide (TRULICITY [place]) Inject 0.5 mg into the skin once a week. Thursday     fluticasone (FLONASE) 50 MCG/ACT nasal spray Place 1 spray into both nostrils daily for 3 days. 16 g 0   furosemide  (LASIX ) 40 MG tablet TAKE 1 TABLET BY MOUTH EVERY DAY 90 tablet  3   geriatric multivitamins-minerals (ELDERTONIC/GEVRABON) LIQD Take 15 mLs by mouth daily.     HYDROcodone -acetaminophen  (HYCET) 7.5-325 mg/15 ml solution Take 10 mL up to TID as needed for pain - not to exceed 30mL daily. 600 mL 0   levocetirizine (XYZAL) 5 MG tablet TAKE 1 TABLET BY MOUTH EVERY DAY IN THE EVENING 90 tablet 1   loperamide  (IMODIUM  A-D) 2 MG tablet Take 2 mg by mouth in the morning, at noon, and at bedtime.     LORazepam  (ATIVAN ) 0.5 MG tablet TAKE 1 TABLET (0.5 MG TOTAL) BY MOUTH EVERY 8 (EIGHT) HOURS. TAKE AS NEEDED FOR AVERSION TO FEEDINGS     magic mouthwash SOLN Take 10 mLs by mouth 4 (four) times daily. Suspension contains equal amounts of Maalox Extra  Strength, nystatin, and diphenhydramine .     Multiple Vitamin (MULTIVITAMIN PO) Take 1 tablet by mouth daily.     ondansetron  (ZOFRAN ) 8 MG tablet Take 8 mg by mouth in the morning, at noon, and at bedtime.     OVER THE COUNTER MEDICATION Take 30 mLs by mouth every 6 (six) hours as needed (Pain). PainQuil     OVER THE COUNTER MEDICATION Take 2 tablets by mouth in the morning and at bedtime. [name]     pantoprazole  (PROTONIX ) 40 MG tablet Take 40 mg by mouth daily.     polyethylene glycol (MIRALAX / GLYCOLAX) 17 g packet Take 17 g by mouth daily as needed for moderate constipation.     potassium chloride  SA (KLOR-CON  M) 20 MEQ tablet Take 1 tablet (20 mEq total) by mouth once for 1 dose. 90 tablet 1   predniSONE (DELTASONE) 20 MG tablet Take 2 tablets by mouth for 6 days then 1 tablet daily for 7 days 19 tablet 0   PREVIDENT 5000 ENAMEL PROTECT 1.1-5 % GEL Place 1 Application onto teeth 2 (two) times daily. *morning and bedtime*     sacubitril-valsartan (ENTRESTO) 49-51 MG Take 1 tablet by mouth 2 (two) times daily. 180 tablet 1   Sod Fluoride-Potassium Nitrate (PREVIDENT 5000 ENAMEL PROTECT DT) Place 1 application  onto teeth in the morning and at bedtime.     solifenacin (VESICARE) 10 MG tablet Take 10 mg by mouth daily.     tadalafil (CIALIS) 20 MG tablet Take 20 mg by mouth daily as needed for erectile dysfunction.     triamcinolone cream (KENALOG) 0.5 % Apply topically 2 (two) times a day. 15 g 0   No current facility-administered medications for this encounter.    Allergies  Allergen Reactions   Norgesic Forte [Orphenadrine-Aspirin -Caffeine] Other (See Comments)    Unknown reaction    Latex Hives and Rash    Blisters      Social History   Socioeconomic History   Marital status: Married    Spouse name: Camie   Number of children: 1   Years of education: Not on file   Highest education level: High school graduate  Occupational History   Occupation: Retired  Tobacco Use    Smoking status: Never   Smokeless tobacco: Never  Vaping Use   Vaping status: Never Used  Substance and Sexual Activity   Alcohol use: Never   Drug use: Never   Sexual activity: Not Currently  Other Topics Concern   Not on file  Social History Narrative   Usually very active - but no routine exercise. 1 daughter, 2 stepsons   Social Drivers of Corporate Investment Banker Strain: Low Risk  (  10/23/2023)   Overall Financial Resource Strain (CARDIA)    Difficulty of Paying Living Expenses: Not very hard  Food Insecurity: No Food Insecurity (10/21/2023)   Hunger Vital Sign    Worried About Running Out of Food in the Last Year: Never true    Ran Out of Food in the Last Year: Never true  Transportation Needs: No Transportation Needs (10/21/2023)   PRAPARE - Administrator, Civil Service (Medical): No    Lack of Transportation (Non-Medical): No  Physical Activity: Sufficiently Active (08/14/2023)   Received from [facility]   Exercise Vital Sign    On average, how many days per week do you engage in moderate to strenuous exercise (like a brisk walk)?: 7 days    On average, how many minutes do you engage in exercise at this level?: 150+ min  Stress: No Stress Concern Present (08/14/2023)   Received from [facility] - Occupational Stress Questionnaire    Do you feel stress - tense, restless, nervous, or anxious, or unable to sleep at night because your mind is troubled all the time - these days?: Not at all  Social Connections: Socially Isolated (10/21/2023)   Social Connection and Isolation Panel    Frequency of Communication with Friends and Family: Never    Frequency of Social Gatherings with Friends and Family: Never    Attends Religious Services: Patient declined    Database Administrator or Organizations: No    Attends Banker Meetings: Never    Marital Status: Married  Catering Manager Violence: Not At Risk  (10/21/2023)   Humiliation, Afraid, Rape, and Kick questionnaire    Fear of Current or Ex-Partner: No    Emotionally Abused: No    Physically Abused: No    Sexually Abused: No     No family history on file.  Vitals:   11/29/23 1124  BP: (!) 182/64  Pulse: 69  SpO2: 97%  Weight: 82.6 kg (182 lb 3.2 oz)  Height: 5' 10 (1.778 m)    Physical Exam  ReDs 41%, abnormal  GENERAL: NAD Lungs- clear  CARDIAC: unable to assess JVD (wearing neck brace)           Normal rate with regular rhythm. No MRG. No LEE  ABDOMEN: Soft, non-tender, non-distended.  EXTREMITIES: Warm and well perfused.  NEUROLOGIC: No obvious FND   ECG: none today  ReDs reading: 41 %, abnormal    ASSESSMENT & PLAN: Chronic HF with mid-range EF - Echo 6/25: EF 55-60%, inferolateral HK, GIIDD, normal RV, mild MR.  - Echo 10/25 EF 45-50%, LV with RWMA, GIIDD, RV normal, LA mod dilated, mild-mod MR - NYHA II - ReDs elevated at 41%, BP elevated  - Increase Entresto to 97-103 mg bid - Start Farxiga 10 mg daily  - Continue Lasix  40 mg daily - Continue coreg  25 mg twice daily - Keep off Spiro (Rash)  - Check BMP/BNP today   CAD - LHC 6/25: mod single vessel disease with 60% RPDA stenosis, medical management - denies CP  - Continue ASA, statin, BB  HTN - elevated - increase Entresto to 97-103 - if BP remains elevated at f/u visit, would recommend adding Hydralazine    Lymphedema - chronic lymphedema 2/2 radiation therapy  - has lymphedema pumps (uses 2 times a day for an hour) and compression hose   DM - controlled, recent Hgb A1c 5.1 - mgmt per PCP  HLD - LDL  54 6/25. - Continue statin   Referred to HFSW (PCP, Medications, Transportation, ETOH Abuse, Drug Abuse, Insurance, Financial ):  No Refer to Pharmacy: No Refer to Home Health:No Refer to Advanced [facility] Failure Clinic: No  Refer to General Cardiology: Patient of Dr. Anner  Continue further management per cardiology. Keep f/u appt  scheduled for next wk.   Caffie Shed, PA-C 11/29/2023

## 2023-11-28 NOTE — Telephone Encounter (Signed)
 Called to confirm/remind patient of their appointment at the [facility] on 11/28/23 11:15.   Appointment:   [x] Confirmed  [] Left mess   [] No answer/No voice mail  [] VM Full/unable to leave message  [] Phone not in service  Patient reminded to bring all medications and/or complete list.  Confirmed patient has transportation. Gave directions, instructed to utilize valet parking.

## 2023-11-29 ENCOUNTER — Other Ambulatory Visit (HOSPITAL_COMMUNITY): Payer: Self-pay

## 2023-11-29 ENCOUNTER — Encounter (HOSPITAL_COMMUNITY): Payer: Self-pay

## 2023-11-29 ENCOUNTER — Ambulatory Visit (HOSPITAL_COMMUNITY)
Admission: RE | Admit: 2023-11-29 | Discharge: 2023-11-29 | Disposition: A | Discharge status: Home / Self Care | Source: Ambulatory Visit | Attending: Cardiology | Admitting: Cardiology

## 2023-11-29 ENCOUNTER — Other Ambulatory Visit: Payer: Self-pay

## 2023-11-29 ENCOUNTER — Other Ambulatory Visit (HOSPITAL_BASED_OUTPATIENT_CLINIC_OR_DEPARTMENT_OTHER): Payer: Self-pay

## 2023-11-29 ENCOUNTER — Ambulatory Visit (HOSPITAL_COMMUNITY): Payer: Self-pay | Admitting: Cardiology

## 2023-11-29 ENCOUNTER — Telehealth (HOSPITAL_COMMUNITY): Payer: Self-pay

## 2023-11-29 VITALS — BP 182/64 | HR 69 | Ht 70.0 in | Wt 182.2 lb

## 2023-11-29 DIAGNOSIS — I890 Lymphedema, not elsewhere classified: Secondary | ICD-10-CM | POA: Diagnosis not present

## 2023-11-29 DIAGNOSIS — Z79899 Other long term (current) drug therapy: Secondary | ICD-10-CM | POA: Diagnosis not present

## 2023-11-29 DIAGNOSIS — I2510 Atherosclerotic heart disease of native coronary artery without angina pectoris: Secondary | ICD-10-CM | POA: Insufficient documentation

## 2023-11-29 DIAGNOSIS — I130 Hypertensive heart and chronic kidney disease with heart failure and stage 1 through stage 4 chronic kidney disease, or unspecified chronic kidney disease: Secondary | ICD-10-CM | POA: Diagnosis present

## 2023-11-29 DIAGNOSIS — E1122 Type 2 diabetes mellitus with diabetic chronic kidney disease: Secondary | ICD-10-CM | POA: Insufficient documentation

## 2023-11-29 DIAGNOSIS — Z7984 Long term (current) use of oral hypoglycemic drugs: Secondary | ICD-10-CM | POA: Diagnosis not present

## 2023-11-29 DIAGNOSIS — I5020 Unspecified systolic (congestive) heart failure: Secondary | ICD-10-CM

## 2023-11-29 DIAGNOSIS — I5022 Chronic systolic (congestive) heart failure: Secondary | ICD-10-CM | POA: Insufficient documentation

## 2023-11-29 DIAGNOSIS — E785 Hyperlipidemia, unspecified: Secondary | ICD-10-CM | POA: Diagnosis not present

## 2023-11-29 DIAGNOSIS — N182 Chronic kidney disease, stage 2 (mild): Secondary | ICD-10-CM | POA: Insufficient documentation

## 2023-11-29 LAB — BASIC METABOLIC PANEL WITH GFR
Anion gap: 15 (ref 5–15)
BUN: 21 mg/dL (ref 8–23)
CO2: 24 mmol/L (ref 22–32)
Calcium: 9 mg/dL (ref 8.9–10.3)
Chloride: 93 mmol/L — ABNORMAL LOW (ref 98–111)
Creatinine, Ser: 1.02 mg/dL (ref 0.61–1.24)
GFR, Estimated: >60 mL/min (ref >60)
Glucose, Bld: 191 mg/dL — ABNORMAL HIGH (ref 70–99)
Potassium: 4.1 mmol/L (ref 3.5–5.1)
Sodium: 132 mmol/L — ABNORMAL LOW (ref 135–145)

## 2023-11-29 MED ORDER — SACUBITRIL-VALSARTAN 97-103 MG PO TABS
1.0000 | ORAL_TABLET | Freq: Two times a day (BID) | ORAL | 3 refills | Status: AC
  Filled 2023-11-29 – 2024-01-20 (×6): qty 60, 30d supply, fill #0

## 2023-11-29 MED ORDER — DAPAGLIFLOZIN PROPANEDIOL 10 MG PO TABS
10.0000 mg | ORAL_TABLET | Freq: Every day | ORAL | 3 refills | Status: AC
  Filled 2023-11-29: qty 90, 90d supply, fill #0

## 2023-11-29 NOTE — Patient Instructions (Signed)
 Medication Changes:  INCREASE ENTRESTO  TO 97/103MG  TWICE DAILY   START FARXIGA  10MG  ONCE DAILY   Lab Work:  Labs done today, your results will be available in MyChart, we will contact you for abnormal readings.  Follow-Up in: WITH GENERAL CARDIOLOGY AS SCHEDULED   At the [facility], you and your health needs are our priority. We have a designated team specialized in the treatment of Heart Failure. This Care Team includes your primary Heart Failure Specialized Cardiologist (physician), Advanced Practice Providers (APPs- Physician Assistants and Nurse Practitioners), and Pharmacist who all work together to provide you with the care you need, when you need it.   You may see any of the following providers on your designated Care Team at your next follow up:  Dr. Toribio Fuel Dr. Ezra Shuck Dr. Odis Brownie Greig Mosses, NP Caffie Shed, [place] [facility] [place], [place] Beckey Coe, NP Jordan Lee, NP Tinnie Redman, PharmD   Please be sure to bring in all your medications bottles to every appointment.   Need to Contact Us :  If you have any questions or concerns before your next appointment please send us  a message through [place] or call our office at [phone].    TO LEAVE A MESSAGE FOR THE NURSE SELECT OPTION 2, PLEASE LEAVE A MESSAGE INCLUDING: YOUR NAME DATE OF BIRTH CALL BACK NUMBER REASON FOR CALL**this is important as we prioritize the call backs  YOU WILL RECEIVE A CALL BACK THE SAME DAY AS LONG AS YOU CALL BEFORE 4:00 PM

## 2023-11-29 NOTE — Telephone Encounter (Signed)
 Advanced Heart Failure Patient Advocate Encounter  Test billing for this patient's current coverage [facility]) returns a $0 copay for 90 day supply of Farxiga  or Jardiance .  This test claim was processed through [facility]- copay amounts may vary at other pharmacies due to pharmacy/plan contracts, or as the patient moves through the different stages of their insurance plan.  Rachel DEL, CPhT Rx Patient Advocate Phone: [phone]

## 2023-11-30 ENCOUNTER — Other Ambulatory Visit (HOSPITAL_BASED_OUTPATIENT_CLINIC_OR_DEPARTMENT_OTHER): Payer: Self-pay

## 2023-11-30 ENCOUNTER — Other Ambulatory Visit (HOSPITAL_COMMUNITY): Payer: Self-pay

## 2023-12-04 ENCOUNTER — Other Ambulatory Visit (HOSPITAL_COMMUNITY): Payer: Self-pay

## 2023-12-04 ENCOUNTER — Other Ambulatory Visit (HOSPITAL_BASED_OUTPATIENT_CLINIC_OR_DEPARTMENT_OTHER): Payer: Self-pay

## 2023-12-04 ENCOUNTER — Encounter: Payer: Self-pay | Admitting: *Deleted

## 2023-12-04 ENCOUNTER — Telehealth: Payer: Self-pay | Admitting: Radiation Oncology

## 2023-12-04 MED ORDER — LIDOCAINE VISCOUS HCL 2 % MT SOLN
Freq: Two times a day (BID) | OROMUCOSAL | 1 refills | Status: AC
  Filled 2023-12-04: qty 400, 10d supply, fill #0

## 2023-12-04 NOTE — Telephone Encounter (Signed)
 Unable to LVM; appt moved to 05/17/24@3pm .

## 2023-12-06 ENCOUNTER — Ambulatory Visit: Attending: Emergency Medicine | Admitting: Emergency Medicine

## 2023-12-06 ENCOUNTER — Other Ambulatory Visit (HOSPITAL_BASED_OUTPATIENT_CLINIC_OR_DEPARTMENT_OTHER): Payer: Self-pay

## 2023-12-06 ENCOUNTER — Encounter: Payer: Self-pay | Admitting: Emergency Medicine

## 2023-12-06 ENCOUNTER — Other Ambulatory Visit: Payer: Self-pay

## 2023-12-06 VITALS — BP 152/90 | HR 75 | Ht 70.0 in | Wt 174.0 lb

## 2023-12-06 DIAGNOSIS — E785 Hyperlipidemia, unspecified: Secondary | ICD-10-CM | POA: Diagnosis not present

## 2023-12-06 DIAGNOSIS — E871 Hypo-osmolality and hyponatremia: Secondary | ICD-10-CM

## 2023-12-06 DIAGNOSIS — I2510 Atherosclerotic heart disease of native coronary artery without angina pectoris: Secondary | ICD-10-CM

## 2023-12-06 DIAGNOSIS — E118 Type 2 diabetes mellitus with unspecified complications: Secondary | ICD-10-CM

## 2023-12-06 DIAGNOSIS — I10 Essential (primary) hypertension: Secondary | ICD-10-CM | POA: Diagnosis not present

## 2023-12-06 DIAGNOSIS — I5020 Unspecified systolic (congestive) heart failure: Secondary | ICD-10-CM | POA: Diagnosis not present

## 2023-12-06 DIAGNOSIS — I890 Lymphedema, not elsewhere classified: Secondary | ICD-10-CM

## 2023-12-06 DIAGNOSIS — I340 Nonrheumatic mitral (valve) insufficiency: Secondary | ICD-10-CM

## 2023-12-06 LAB — BASIC METABOLIC PANEL WITH GFR
BUN/Creatinine Ratio: 32 — ABNORMAL HIGH (ref 10–24)
BUN: 30 mg/dL — ABNORMAL HIGH (ref 8–27)
CO2: 22 mmol/L (ref 20–29)
Calcium: 8.9 mg/dL (ref 8.6–10.2)
Chloride: 96 mmol/L (ref 96–106)
Creatinine, Ser: 0.95 mg/dL (ref 0.76–1.27)
Glucose: 180 mg/dL — ABNORMAL HIGH (ref 70–99)
Potassium: 4.2 mmol/L (ref 3.5–5.2)
Sodium: 134 mmol/L (ref 134–144)
eGFR: 88 mL/min/1.73 (ref >59)

## 2023-12-06 MED ORDER — HYDRALAZINE HCL 25 MG PO TABS
25.0000 mg | ORAL_TABLET | Freq: Three times a day (TID) | ORAL | 3 refills | Status: DC
  Filled 2023-12-06: qty 260, 86d supply, fill #0
  Filled 2023-12-06: qty 10, 4d supply, fill #0
  Filled 2023-12-06: qty 270, 90d supply, fill #0

## 2023-12-06 NOTE — Patient Instructions (Addendum)
 Medication Instructions:  START HYDRALAZINE  25 MG THREE TIMES DAILY.  Lab Work: NONE TO BE DONE TODAY.  Testing/Procedures: NONE  Follow-Up: At [facility], you and your health needs are our priority.  As part of our continuing mission to provide you with exceptional heart care, our providers are all part of one team.  This team includes your primary Cardiologist (physician) and Advanced Practice Providers or APPs (Physician Assistants and Nurse Practitioners) who all work together to provide you with the care you need, when you need it.  Your next appointment:   2 MONTHS  Provider:   Alm Clay, MD OR Lum Louis, NP

## 2023-12-06 NOTE — Progress Notes (Signed)
 Cardiology Office Note:    Date:  12/06/2023  ID:  Randall [name], DOB 07-06-1955, MRN [id number] PCP: Lynwood Laneta LELON DEVONNA  [facility] Providers Cardiologist:  Alm Clay, MD Cardiology APP:  Rana Lum CROME, NP       Patient Profile:       Chief Complaint: 1 month follow-up History of Present Illness:  Randall [name] is a 68 y.o. male with visit-pertinent history of  resistant hypertension, chronic diastolic heart failure, HLD, elevated coronary artery calcium  score, thoracic aortic atherosclerosis, right bundle branch block, T2DM, laryngeal cancer   He established care with cardiology service in November 2022 for abnormal EKG and hypertension management.  He was on 3 antihypertensive agents with persistently elevated blood pressures and his EKG showed right bundle branch block.  Echocardiogram was ordered and completed 12/08/2020 showing LVEF 45-50%, global hypokinesis, grade 1 DD, mild mitral valve regurgitation, mild calcification of the aortic valve.  Decreased LV function was thought to be in the setting of uncontrolled HTN.  Bundle branch block thought to be related to wall motion abnormality.  CT cardiac scoring completed on 02/02/2021 showing coronary calcium  score 683, 87 percentile for age, race, sex and over read interpretation CT chest showing aortic atherosclerosis.   Seen in clinic on 02/18/2022 and was without any active cardiovascular symptoms.  His blood pressure was still borderline high and his chlorthalidone  was titrated up to 25 mg.  He was on combination of amlodipine  10 mg, carvedilol  25 mg twice daily, telmisartan 80 mg daily, chlorthalidone  25 mg daily.   Seen in clinic on 01/12/2023.  Noted to experience significant increase in his chronic lower extremity edema.  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  His Lasix  was increased to 40 mg daily.  Echocardiogram ordered showing LVEF 55-60%, grade 2 DD, RV SF  normal, RV moderately enlarged, left atrial size severely dilated, no valvular abnormalities   Patient was admitted on 06/10/2023 for chest pain.  He presented with 3 days of left arm discomfort with associated midsternal chest pain. ECG with RBBB/LAFB which is intermittent from 09/25/20 with some ECG with normal conduction and others with 1' AVB, RBBB and LAFB. No ischemic changes.  He underwent cardiac catheterization on 06/12/2023 showing moderate single-vessel disease with 60% RPDA stenosis with normal LVEF and normal EDP.  Echocardiogram 06/12/2023 showed LVEF 55 to 60%, inferolateral hypokinesis, grade 2 DD, RV function size normal, mild mitral valve regurgitation.  Chlorthalidone  was discontinued and he was started on amlodipine  due to hyponatremia.   Seen in clinic on 06/26/2023.  He had been experiencing leg swelling particularly after discontinuation of chlorthalidone  and furosemide  in the hospital.  He had self-started taking his prior 20 mg of Lasix  as well as his chlorthalidone  due to fluid retention.  His chlorthalidone  was discontinued once more during office visit due to his history of hyponatremia.  He was to continue Lasix  as needed.  He was currently drinking 220 ml of fluid due to dry mouth s/p history of radiation therapy.  I have asked him to decrease his fluid intake due to possible sodium dilution.  Follow-up labs showed normalization in his electrolytes after decreasing his fluid intake.   He was admitted on 10/2023 with acute on chronic heart failure.  He was diuresed almost 10 mL with IV Lasix .  Echocardiogram showed LVEF 45 to 50%, RWMA, grade 2 DD, RV normal, LA mildly dilated, mild-moderate MR.  He was subsequently started on spironolactone   and Entresto .   He followed up with TOC clinic on 11/01/2023.  He was doing well with NYHA I-II symptoms.  Reported to have hives after hospitalization which seem to have improved with prednisone  taper.  No medication changes were made.  He called  to office on 11/06/2023 noted ongoing hives/itching.  Entresto  was discontinued at that time.  Was seen for follow-up on 11/10/2023.  His Entresto  was discontinued approximately 5 days ago without any improvement in symptoms.  Symptoms did not resolve with prednisone  taper.  Plan was to reinitiate Entresto  and discontinue spironolactone  given his intense pruritus.  Followed up with heart failure clinic on 11/29/2023.  This rash have resolved off of spironolactone .  Tolerating Entresto  okay.  His Entresto  was increased to 97-103 mg twice daily.  He was started on Farxiga  10 mg daily.   Discussed the use of AI scribe software for clinical note transcription with the patient, who gave verbal consent to proceed.  History of Present Illness Randall [name] is a 68 year old male with heart failure who presents for 1 month follow-up for hypertension.  Today he is doing well without acute cardiovascular concerns.  He is without any chest pains, dyspnea, orthopnea, PND.  His lymphedema is well-controlled.  He continues to monitor his fluid intake and use lymphedema pumps.  He takes his blood pressure periodically at home and will sometimes notice blood pressures in the 130s and sometimes in the 160s.  He does stay adherent to his medication regimen.  He has been taking some sinus medication recently called sinus max that seemed to have elevated his blood pressure.  He developed a rash attributed to spironolactone  which has resolved.  He denies lightheadedness, dizziness, syncope, presyncope    Review of systems:  Please see the history of present illness. All other systems are reviewed and otherwise negative.      Studies Reviewed:        Echocardiogram 10/22/2023 1. Left ventricular ejection fraction, by estimation, is 45 to 50%. The  left ventricle has mildly decreased function. The left ventricle  demonstrates regional wall motion abnormalities (see scoring  diagram/findings  for description). There is mild  concentric left ventricular hypertrophy. Left ventricular diastolic  parameters are consistent with Grade II diastolic dysfunction  (pseudonormalization). There is moderate hypokinesis of the left  ventricular, basal-mid inferior wall and inferolateral  wall.   2. Right ventricular systolic function is normal. The right ventricular  size is normal.   3. Left atrial size was mild to moderately dilated.   4. The mitral valve is normal in structure. Mild to moderate mitral valve  regurgitation. No evidence of mitral stenosis.   5. The aortic valve is tricuspid. There is mild calcification of the  aortic valve. There is mild thickening of the aortic valve. Aortic valve  regurgitation is not visualized. Aortic valve sclerosis/calcification is  present, without any evidence of  aortic stenosis.   Echocardiogram 06/12/2023 1. Left ventricular ejection fraction, by estimation, is 55 to 60%. Left  ventricular ejection fraction by 2D MOD biplane is 57.6 %. The left  ventricle has normal function. The left ventricle demonstrates regional  wall motion abnormalities with  inferolateral hypokinesis. Left ventricular diastolic parameters are  consistent with Grade II diastolic dysfunction (pseudonormalization).   2. Right ventricular systolic function is normal. The right ventricular  size is normal. Tricuspid regurgitation signal is inadequate for assessing  PA pressure.   3. Left atrial size was mildly dilated.   4.  The mitral valve is normal in structure. Mild mitral valve  regurgitation. No evidence of mitral stenosis.   5. The aortic valve is tricuspid. There is mild calcification of the  aortic valve. Aortic valve regurgitation is not visualized. No aortic  stenosis is present.   6. The inferior vena cava is dilated in size with <50% respiratory  variability, suggesting right atrial pressure of 15 mmHg.      Cardiac catheterization 06/12/2023 POST-OPERATIVE  DIAGNOSIS:   Moderate single-vessel disease with 60% RPDA stenosis and a codominant system. Normal LVEF with normal EDP   RECOMMENDATIONS:     In the absence of any other complications or medical issues, we expect the patient to be ready for discharge from a cath perspective on 06/12/2023.   Recommend Aspirin  81mg  daily for moderate CAD. Diagnostic Dominance: Co-dominant   Risk Assessment/Calculations:     HYPERTENSION CONTROL Vitals:   12/06/23 1450 12/06/23 1540  BP: (!) 140/90 (!) 152/90    The patient's blood pressure is elevated above target today.  In order to address the patient's elevated BP: A new medication was prescribed today.           Physical Exam:   VS:  BP (!) 152/90   Pulse 75   Ht 5' 10 (1.778 m)   Wt 174 lb (78.9 kg)   BMI 24.97 kg/m    Wt Readings from Last 3 Encounters:  12/06/23 174 lb (78.9 kg)  11/29/23 182 lb 3.2 oz (82.6 kg)  11/10/23 178 lb (80.7 kg)    GEN: Well nourished, well developed in no acute distress NECK: No JVD; No carotid bruits CARDIAC: RRR, no murmurs, rubs, gallops RESPIRATORY:  Clear to auscultation without rales, wheezing or rhonchi  ABDOMEN: Soft, non-tender, non-distended EXTREMITIES:  No edema; No acute deformity      Assessment and Plan:  Coronary artery disease LHC 06/2023 with moderate single-vessel disease with 60% RPDA stenosis and a codominant system, normal LVEF and EDP Echocardiogram 06/2023 with LVEF 55 to 60% - Today he is stable without chest pains.  He continues to remain active and denies any exertional symptoms.  He has no symptoms to suggest active angina, no indication for ischemic evaluation at this time - Continue aspirin  81 mg daily, atorvastatin  80 mg daily, carvedilol  25 mg twice daily   Chronic HFmrEF Hypertensive heart disease Echocardiogram 06/2023 with LVEF 55 to 60%, inferolateral hypokinesis, grade 2DD Admitted 10/2023 with acute on chronic HF exacerbation and diuresed nearly  10L Echocardiogram 10/2023 with LVEF 45 to 50%, mild LVH, grade 2 DD, RV normal - NYHA class II - His weight is stable today.  He denies any symptoms such as dyspnea, orthopnea, PND, LEE - Did see improvement in LEE and hyponatremia when he decreased his water intake from 220 mL daily to 60-80 mL daily.  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  Start hydralazine  25 mg 3 times daily - He drinks 1 Ensure supplement daily   Hypertension Blood pressure today is elevated at 152/90 Home BP ranges between 130-160 systolic - Continue HF GDMT  - Start hydralazine  25 mg 3 times daily  Mitral valve regurgitation Echocardiogram 10/2023 showed mild to moderate mitral  valve regurgitation - Asymptomatic at this time.  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  Possible sodium dilution due to drinking 220 mL daily due to dry mouth      Dispo:  Return in about 2 months (around 02/05/2024).  Signed, Lum LITTIE Louis, NP

## 2023-12-08 ENCOUNTER — Other Ambulatory Visit: Payer: Self-pay

## 2023-12-08 ENCOUNTER — Other Ambulatory Visit (HOSPITAL_BASED_OUTPATIENT_CLINIC_OR_DEPARTMENT_OTHER): Payer: Self-pay

## 2023-12-08 MED ORDER — LIDOCAINE VISCOUS HCL 2 % MT SOLN
10.0000 mL | Freq: Four times a day (QID) | OROMUCOSAL | 1 refills | Status: AC
  Filled 2023-12-08: qty 540, 14d supply, fill #0
  Filled 2023-12-09: qty 600, 14d supply, fill #1
  Filled 2023-12-09: qty 200, 5d supply, fill #0
  Filled 2023-12-15: qty 600, 14d supply, fill #2
  Filled 2023-12-30: qty 600, 14d supply, fill #3
  Filled 2024-01-12 – 2024-01-19 (×2): qty 600, 14d supply, fill #0
  Filled 2024-02-02 – 2024-02-05 (×2): qty 600, 14d supply, fill #1

## 2023-12-09 ENCOUNTER — Other Ambulatory Visit (HOSPITAL_BASED_OUTPATIENT_CLINIC_OR_DEPARTMENT_OTHER): Payer: Self-pay

## 2023-12-11 ENCOUNTER — Other Ambulatory Visit (HOSPITAL_BASED_OUTPATIENT_CLINIC_OR_DEPARTMENT_OTHER): Payer: Self-pay

## 2023-12-11 ENCOUNTER — Ambulatory Visit: Payer: Self-pay | Admitting: Emergency Medicine

## 2023-12-11 ENCOUNTER — Encounter: Payer: Self-pay | Admitting: Radiation Oncology

## 2023-12-13 ENCOUNTER — Other Ambulatory Visit (HOSPITAL_BASED_OUTPATIENT_CLINIC_OR_DEPARTMENT_OTHER): Payer: Self-pay

## 2023-12-15 ENCOUNTER — Other Ambulatory Visit (HOSPITAL_BASED_OUTPATIENT_CLINIC_OR_DEPARTMENT_OTHER): Payer: Self-pay

## 2023-12-15 ENCOUNTER — Encounter: Payer: Self-pay | Admitting: Radiation Oncology

## 2023-12-22 DIAGNOSIS — I509 Heart failure, unspecified: Secondary | ICD-10-CM

## 2023-12-23 ENCOUNTER — Other Ambulatory Visit (HOSPITAL_BASED_OUTPATIENT_CLINIC_OR_DEPARTMENT_OTHER): Payer: Self-pay

## 2023-12-25 ENCOUNTER — Other Ambulatory Visit (HOSPITAL_BASED_OUTPATIENT_CLINIC_OR_DEPARTMENT_OTHER): Payer: Self-pay

## 2023-12-26 ENCOUNTER — Encounter: Payer: Self-pay | Admitting: Radiation Oncology

## 2023-12-26 ENCOUNTER — Other Ambulatory Visit (HOSPITAL_BASED_OUTPATIENT_CLINIC_OR_DEPARTMENT_OTHER): Payer: Self-pay

## 2023-12-26 MED ORDER — TADALAFIL 5 MG PO TABS
5.0000 mg | ORAL_TABLET | Freq: Every day | ORAL | 3 refills | Status: AC
  Filled 2023-12-26: qty 90, 90d supply, fill #0

## 2023-12-28 ENCOUNTER — Other Ambulatory Visit (HOSPITAL_BASED_OUTPATIENT_CLINIC_OR_DEPARTMENT_OTHER): Payer: Self-pay

## 2023-12-28 MED ORDER — AMOXICILLIN-POT CLAVULANATE 875-125 MG PO TABS
1.0000 | ORAL_TABLET | Freq: Two times a day (BID) | ORAL | 0 refills | Status: AC
  Filled 2023-12-28: qty 14, 7d supply, fill #0

## 2023-12-28 MED ORDER — PREDNISONE 20 MG PO TABS
40.0000 mg | ORAL_TABLET | Freq: Every day | ORAL | 0 refills | Status: AC
  Filled 2023-12-28: qty 6, 3d supply, fill #0

## 2023-12-30 ENCOUNTER — Other Ambulatory Visit (HOSPITAL_BASED_OUTPATIENT_CLINIC_OR_DEPARTMENT_OTHER): Payer: Self-pay

## 2024-01-02 ENCOUNTER — Other Ambulatory Visit (HOSPITAL_BASED_OUTPATIENT_CLINIC_OR_DEPARTMENT_OTHER): Payer: Self-pay

## 2024-01-02 MED ORDER — EPLERENONE 25 MG PO TABS: 25.0000 mg | ORAL_TABLET | Freq: Every morning | ORAL | 1 refills | Status: AC

## 2024-01-02 MED ORDER — HYDROCODONE-ACETAMINOPHEN 7.5-325 MG/15ML PO SOLN
10.0000 mL | Freq: Two times a day (BID) | ORAL | 0 refills | Status: DC
  Filled 2024-01-02: qty 500, 25d supply, fill #0

## 2024-01-02 NOTE — Addendum Note (Signed)
 Addended by: BETHENA POWELL SAUNDERS on: 01/02/2024 03:24 PM   Modules accepted: Orders

## 2024-01-02 NOTE — Telephone Encounter (Signed)
 Per Lum Louis, NP: Weight does seem to continue to increase. Plan to discontinue hydralazine  today. Please start eplerenone  25 mg daily to take in the AM. Recommend increasing furosemide  to 80 mg over the next 3 days then to resume at 40 mg daily. Please repeat BMET 1 week after starting eplerenone .  Spoke to patient in depth regarding new plan. Orders placed for all of the above. Rx sent to pharmacy of choice. Pt stated he has been taking 80mg  Furosemide  every day already. Instructed to do for 3 more days (per orders) and decrease back to 40 mg daily. Will update Madison, NP. Verbalized understanding of plan. Pt also instructed to go to UC or ED if SOB worsened over the holidays.

## 2024-01-10 LAB — BASIC METABOLIC PANEL WITH GFR
BUN/Creatinine Ratio: 21 (ref 10–24)
BUN: 19 mg/dL (ref 8–27)
CO2: 22 mmol/L (ref 20–29)
Calcium: 9 mg/dL (ref 8.6–10.2)
Chloride: 103 mmol/L (ref 96–106)
Creatinine, Ser: 0.92 mg/dL (ref 0.76–1.27)
Glucose: 176 mg/dL — ABNORMAL HIGH (ref 70–99)
Potassium: 4.3 mmol/L (ref 3.5–5.2)
Sodium: 139 mmol/L (ref 134–144)
eGFR: 91 mL/min/1.73

## 2024-01-12 ENCOUNTER — Other Ambulatory Visit (HOSPITAL_COMMUNITY): Payer: Self-pay

## 2024-01-12 ENCOUNTER — Ambulatory Visit: Payer: Self-pay | Admitting: Emergency Medicine

## 2024-01-13 ENCOUNTER — Other Ambulatory Visit (HOSPITAL_COMMUNITY): Payer: Self-pay

## 2024-01-15 ENCOUNTER — Other Ambulatory Visit (HOSPITAL_COMMUNITY): Payer: Self-pay

## 2024-01-16 ENCOUNTER — Telehealth: Payer: Self-pay | Admitting: Pharmacy Technician

## 2024-01-16 ENCOUNTER — Other Ambulatory Visit (HOSPITAL_COMMUNITY): Payer: Self-pay

## 2024-01-16 ENCOUNTER — Encounter: Payer: Self-pay | Admitting: Radiation Oncology

## 2024-01-16 NOTE — Telephone Encounter (Signed)
 Hi, we were looking into getting the patient a cardiomyopathy grant since he has heart failure. If this is appropriate can you please provide the ICD 10 code and have it added to the chart? Thank you!   Message sent in original encounter     [facility] for [facility].75 for 30 days

## 2024-01-17 ENCOUNTER — Other Ambulatory Visit: Payer: Self-pay

## 2024-01-17 ENCOUNTER — Encounter: Payer: Self-pay | Admitting: Pharmacist

## 2024-01-18 ENCOUNTER — Encounter: Payer: Self-pay | Admitting: Radiation Oncology

## 2024-01-18 ENCOUNTER — Other Ambulatory Visit (HOSPITAL_COMMUNITY): Payer: Self-pay

## 2024-01-18 ENCOUNTER — Other Ambulatory Visit: Payer: Self-pay

## 2024-01-18 ENCOUNTER — Encounter: Payer: Self-pay | Admitting: Cardiology

## 2024-01-18 NOTE — Telephone Encounter (Signed)
 Patient Advocate Encounter   The patient was approved for a [employer] grant that will help cover the cost of ENTRESTO  Total amount awarded, 7500.  Effective: 12/19/23 - 12/17/24   APW:[id number] ERW:[id number] Group:[id number] PI:[id number] [employer] ID: [id number]   Pharmacy provided with approval and processing information. Patient informed via mychart

## 2024-01-19 ENCOUNTER — Other Ambulatory Visit (HOSPITAL_COMMUNITY): Payer: Self-pay

## 2024-01-19 ENCOUNTER — Emergency Department (HOSPITAL_COMMUNITY)

## 2024-01-19 ENCOUNTER — Encounter (HOSPITAL_COMMUNITY): Payer: Self-pay

## 2024-01-19 ENCOUNTER — Emergency Department (HOSPITAL_COMMUNITY): Admission: EM | Admit: 2024-01-19 | Discharge: 2024-01-19 | Disposition: A | Discharge status: Home / Self Care

## 2024-01-19 ENCOUNTER — Other Ambulatory Visit: Payer: Self-pay

## 2024-01-19 DIAGNOSIS — I509 Heart failure, unspecified: Secondary | ICD-10-CM | POA: Insufficient documentation

## 2024-01-19 DIAGNOSIS — E1122 Type 2 diabetes mellitus with diabetic chronic kidney disease: Secondary | ICD-10-CM | POA: Insufficient documentation

## 2024-01-19 DIAGNOSIS — Z7982 Long term (current) use of aspirin: Secondary | ICD-10-CM | POA: Insufficient documentation

## 2024-01-19 DIAGNOSIS — N189 Chronic kidney disease, unspecified: Secondary | ICD-10-CM | POA: Diagnosis not present

## 2024-01-19 DIAGNOSIS — Z91040 Latex allergy status: Secondary | ICD-10-CM | POA: Insufficient documentation

## 2024-01-19 DIAGNOSIS — K219 Gastro-esophageal reflux disease without esophagitis: Secondary | ICD-10-CM | POA: Insufficient documentation

## 2024-01-19 DIAGNOSIS — R1110 Vomiting, unspecified: Secondary | ICD-10-CM | POA: Diagnosis present

## 2024-01-19 DIAGNOSIS — I130 Hypertensive heart and chronic kidney disease with heart failure and stage 1 through stage 4 chronic kidney disease, or unspecified chronic kidney disease: Secondary | ICD-10-CM | POA: Diagnosis not present

## 2024-01-19 LAB — BASIC METABOLIC PANEL WITH GFR
Anion gap: 12 (ref 5–15)
BUN: 24 mg/dL — ABNORMAL HIGH (ref 8–23)
CO2: 24 mmol/L (ref 22–32)
Calcium: 9.6 mg/dL (ref 8.9–10.3)
Chloride: 99 mmol/L (ref 98–111)
Creatinine, Ser: 0.94 mg/dL (ref 0.61–1.24)
GFR, Estimated: >60 mL/min
Glucose, Bld: 131 mg/dL — ABNORMAL HIGH (ref 70–99)
Potassium: 3.9 mmol/L (ref 3.5–5.1)
Sodium: 136 mmol/L (ref 135–145)

## 2024-01-19 LAB — CBC
HCT: 38.3 % — ABNORMAL LOW (ref 39.0–52.0)
Hemoglobin: 13 g/dL (ref 13.0–17.0)
MCH: 32.3 pg (ref 26.0–34.0)
MCHC: 33.9 g/dL (ref 30.0–36.0)
MCV: 95 fL (ref 80.0–100.0)
Platelets: 190 K/uL (ref 150–400)
RBC: 4.03 MIL/uL — ABNORMAL LOW (ref 4.22–5.81)
RDW: 14.6 % (ref 11.5–15.5)
WBC: 8.4 K/uL (ref 4.0–10.5)
nRBC: 0 % (ref 0.0–0.2)

## 2024-01-19 LAB — TROPONIN T, HIGH SENSITIVITY
Troponin T High Sensitivity: 21 ng/L — ABNORMAL HIGH (ref 0–19)
Troponin T High Sensitivity: 19 ng/L (ref 0–19)

## 2024-01-19 MED ORDER — PANTOPRAZOLE SODIUM 40 MG PO TBEC: 40.0000 mg | DELAYED_RELEASE_TABLET | Freq: Every day | ORAL | 0 refills | Status: DC

## 2024-01-19 MED ORDER — PANTOPRAZOLE SODIUM 40 MG PO TBEC
40.0000 mg | DELAYED_RELEASE_TABLET | Freq: Every day | ORAL | Status: DC
  Administered 2024-01-19: 40 mg via ORAL
  Filled 2024-01-19: qty 1

## 2024-01-19 MED ORDER — OMEPRAZOLE 20 MG PO CPDR: 20.0000 mg | DELAYED_RELEASE_CAPSULE | Freq: Every day | ORAL | 0 refills | Status: AC

## 2024-01-19 NOTE — ED Provider Notes (Signed)
 " [name] EMERGENCY DEPARTMENT AT [facility] Provider Note   CSN: [id number] Arrival date & time: 01/19/24  9074     Patient presents with: Gastroesophageal Reflux   Randall [name] is a 69 y.o. male.  {Add pertinent medical, surgical, social history, OB history to [id number]}  Gastroesophageal Reflux       Prior to Admission medications  Medication Sig Start Date End Date Taking? Authorizing Provider  aspirin  EC 81 MG tablet Take 81 mg by mouth daily. Swallow whole.    [provider]  atorvastatin  (LIPITOR ) 80 MG tablet Take 1 tablet (80 mg total) by mouth daily. 06/13/23   Wouk, Devaughn Sayres, MD  carvedilol  (COREG ) 25 MG tablet TAKE 1 TABLET BY MOUTH EVERY DAY IN THE MORNING AND IN THE EVENING WITH MEALS 11/24/23   Anner Alm ORN, MD  carvedilol  (COREG ) 25 MG tablet Take 1 tablet (25 mg total) by mouth in the morning and at bedtime. 11/23/23   Anner Alm ORN, MD  chlorhexidine  (PERIDEX ) 0.12 % solution Use as directed 10 mLs in the mouth or throat 2 (two) times daily.    [provider]  Continuous Glucose Sensor (FREESTYLE LIBRE 3 PLUS SENSOR) MISC REPLACE SENSOR EVERY 2 WEEKS - DIAGNOSIS TYPE 2 DM    [provider]  dapagliflozin  propanediol (FARXIGA ) 10 MG TABS tablet Take 1 tablet (10 mg total) by mouth daily before breakfast. 11/29/23   Marcine Catalan M, PA-C  diphenhydrAMINE -lidocaine -alum & mag hydroxide-simeth Take 10 mLs by mouth 4 (four) times daily. 12/08/23     docusate sodium  (COLACE) 100 MG capsule Take 100 mg by mouth in the morning, at noon, and at bedtime.    [provider]  Dulaglutide (TRULICITY Quintana) Inject 0.5 mg into the skin once a week. Thursday    [provider]  eplerenone  (INSPRA ) 25 MG tablet Take 1 tablet (25 mg total) by mouth in the morning. 01/02/24   Rana Lum CROME, NP  fluticasone  (FLONASE ) 50 MCG/ACT nasal spray Place 1 spray into both nostrils daily for 3 days. 10/25/23 12/06/23   Fairy Frames, MD  furosemide  (LASIX ) 40 MG tablet TAKE 1 TABLET BY MOUTH EVERY DAY 11/01/23   Anner Alm ORN, MD  geriatric multivitamins-minerals (ELDERTONIC/GEVRABON) LIQD Take 15 mLs by mouth daily.    [provider]  HYDROcodone -acetaminophen  (HYCET) 7.5-325 mg/15 ml solution Take 10 mL up to TID as needed for pain - not to exceed 30mL daily. 02/27/23   Izell Domino, MD  levocetirizine (XYZAL ) 5 MG tablet TAKE 1 TABLET BY MOUTH EVERY DAY IN THE EVENING 10/30/23   Hedges, Reyes, PA-C  loperamide  (IMODIUM  A-D) 2 MG tablet Take 2 mg by mouth in the morning, at noon, and at bedtime.    [provider]  LORazepam  (ATIVAN ) 0.5 MG tablet TAKE 1 TABLET (0.5 MG TOTAL) BY MOUTH EVERY 8 (EIGHT) HOURS. TAKE AS NEEDED FOR AVERSION TO FEEDINGS    [provider]  magic mouthwash (lidocaine , diphenhydrAMINE , alum & mag hydroxide) suspension Use 10 mL by mouth 4 (four) times daily as needed. 11/23/23     magic mouthwash SOLN Take 10 mLs by mouth 4 (four) times daily. Suspension contains equal amounts of Maalox Extra Strength, nystatin, and diphenhydramine .    [provider]  Multiple Vitamin (MULTIVITAMIN PO) Take 1 tablet by mouth daily.    [provider]  ondansetron  (ZOFRAN ) 8 MG tablet Take 8 mg by mouth in the morning, at noon, and at bedtime.  [provider]  OVER THE COUNTER MEDICATION Take 30 mLs by mouth every 6 (six) hours as needed (Pain). PainQuil    [provider]  OVER THE COUNTER MEDICATION Take 2 tablets by mouth in the morning and at bedtime. [name]    [provider]  pantoprazole  (PROTONIX ) 40 MG tablet Take 40 mg by mouth daily.    [provider]  polyethylene glycol (MIRALAX  / GLYCOLAX ) 17 g packet Take 17 g by mouth daily as needed for moderate constipation.    [provider]  potassium chloride  SA (KLOR-CON  M) 20 MEQ tablet Take 1 tablet (20 mEq total) by mouth once for 1 dose.  11/10/23 12/06/23  Rana Lum CROME, NP  PREVIDENT 5000 ENAMEL PROTECT 1.1-5 % GEL Place 1 Application onto teeth 2 (two) times daily. *morning and bedtime* 11/10/23   [provider]  sacubitril -valsartan  (ENTRESTO ) 97-103 MG Take 1 tablet by mouth 2 (two) times daily. 11/29/23   Marcine Catalan M, PA-C  Sod Fluoride-Potassium Nitrate (PREVIDENT 5000 ENAMEL PROTECT DT) Place 1 application  onto teeth in the morning and at bedtime.    [provider]  solifenacin (VESICARE) 10 MG tablet Take 10 mg by mouth daily.    [provider]  tadalafil  (CIALIS ) 20 MG tablet Take 20 mg by mouth daily as needed for erectile dysfunction. 07/27/20   [provider]  tadalafil  (CIALIS ) 5 MG tablet Take 1 tablet (5 mg total) by mouth daily. 12/26/23     triamcinolone  cream (KENALOG ) 0.5 % Apply topically 2 (two) times a day. 11/27/23       Allergies: Norgesic forte [orphenadrine-aspirin -caffeine] and Latex    Review of Systems  Updated Vital Signs BP (!) 170/76   Pulse 66   Temp 97.8 F (36.6 C) (Oral)   Resp 16   Ht 5' 10 (1.778 m)   Wt 78 kg   SpO2 100%   BMI 24.68 kg/m   Physical Exam  (all labs ordered are listed, but only abnormal results are displayed) Labs Reviewed  BASIC METABOLIC PANEL WITH GFR - Abnormal; Notable for the following components:      Result Value   Glucose, Bld 131 (*)    BUN 24 (*)    All other components within normal limits  CBC - Abnormal; Notable for the following components:   RBC 4.03 (*)    HCT 38.3 (*)    All other components within normal limits  TROPONIN T, HIGH SENSITIVITY - Abnormal; Notable for the following components:   Troponin T High Sensitivity 21 (*)    All other components within normal limits  TROPONIN T, HIGH SENSITIVITY    EKG: None  Radiology: DG Chest 2 View Result Date: 01/19/2024 CLINICAL DATA:  Chest discomfort. EXAM: CHEST - 2 VIEW COMPARISON:  10/23/2023 FINDINGS: The heart size and  mediastinal contours are within normal limits. Stable mild elevation of left hemidiaphragm. Both lungs are clear. The visualized skeletal structures are unremarkable. IMPRESSION: No active cardiopulmonary disease. Electronically Signed   By: Norleen DELENA Kil M.D.   On: 01/19/2024 10:55    {Document cardiac monitor, telemetry assessment procedure when appropriate:32947} Procedures   Medications Ordered in the ED - No data to display    {Click here for ABCD2, HEART and other calculators REFRESH Note before signing:1}                              Medical Decision  Making Amount and/or Complexity of Data Reviewed Labs: ordered. Radiology: ordered.   ***  {Document critical care time when appropriate  Document review of labs and clinical decision tools ie CHADS2VASC2, etc  Document your independent review of radiology images and any outside records  Document your discussion with family members, caretakers and with consultants  Document social determinants of health affecting pt's care  Document your decision making why or why not admission, treatments were needed:32947:::1}   Final diagnoses:  None    ED Discharge Orders     None        "

## 2024-01-19 NOTE — ED Triage Notes (Signed)
 Pt states he had severe acid reflux and went to UC for a refill pantoprazole . While at urgent care pt had an abnormal EKG. Pt denies cp, sob, or back pain. Pt says the sx are worse when bending over.    Emesis 5x

## 2024-01-19 NOTE — ED Provider Notes (Incomplete)
 " [facility] Provider Note   CSN: [id number] Arrival date & time: 01/19/24  9074     Patient presents with: Gastroesophageal Reflux   Randall [name] is a 69 y.o. male with a history of hypertension, GERD, diabetes, CKD, CHF, laryngeal mass with associated lymphedema who presents to the ED with worsening acid reflux/GERD symptoms that began around 2 weeks ago after patient ran out of his Protonix .  Patient states that he was seen at urgent care today for a refill on Protonix  and evaluation of his GERD, when he pleated EKG during the visit and the provider was unsure if right bundle branch block was a new or old finding - patient was sent here for evaluation.  Patient endorses a history of right bundle branch block and EKG changes.  Patient denies any chest pain or shortness of breath.  Patient states that discomfort is epigastric burning into his throat with associated vomit/acid reflux as per his normal GERD symptoms when untreated.  Patient is closely followed by cardiology and states that he has a appointment in the next couple of weeks.  The patient is in no acute distress.   {Add pertinent medical, surgical, social history, OB history to YEP:67052}  Gastroesophageal Reflux       Prior to Admission medications  Medication Sig Start Date End Date Taking? Authorizing Provider  omeprazole  (PRILOSEC) 20 MG capsule Take 1 capsule (20 mg total) by mouth daily. 01/19/24 02/09/24 Yes Huan Pollok L, PA  aspirin  EC 81 MG tablet Take 81 mg by mouth daily. Swallow whole.    [provider]  atorvastatin  (LIPITOR ) 80 MG tablet Take 1 tablet (80 mg total) by mouth daily. 06/13/23   Wouk, Devaughn Sayres, MD  carvedilol  (COREG ) 25 MG tablet TAKE 1 TABLET BY MOUTH EVERY DAY IN THE MORNING AND IN THE EVENING WITH MEALS 11/24/23   Anner Alm ORN, MD  carvedilol  (COREG ) 25 MG tablet Take 1 tablet (25 mg total) by mouth in the morning and at bedtime. 11/23/23    Anner Alm ORN, MD  chlorhexidine  (PERIDEX ) 0.12 % solution Use as directed 10 mLs in the mouth or throat 2 (two) times daily.    [provider]  Continuous Glucose Sensor (FREESTYLE LIBRE 3 PLUS SENSOR) MISC REPLACE SENSOR EVERY 2 WEEKS - DIAGNOSIS TYPE 2 DM    [provider]  dapagliflozin  propanediol (FARXIGA ) 10 MG TABS tablet Take 1 tablet (10 mg total) by mouth daily before breakfast. 11/29/23   Marcine Catalan M, PA-C  diphenhydrAMINE -lidocaine -alum & mag hydroxide-simeth Take 10 mLs by mouth 4 (four) times daily. 12/08/23     docusate sodium  (COLACE) 100 MG capsule Take 100 mg by mouth in the morning, at noon, and at bedtime.    [provider]  Dulaglutide (TRULICITY [place]) Inject 0.5 mg into the skin once a week. Thursday    [provider]  eplerenone  (INSPRA ) 25 MG tablet Take 1 tablet (25 mg total) by mouth in the morning. 01/02/24   Rana Lum CROME, NP  fluticasone  (FLONASE ) 50 MCG/ACT nasal spray Place 1 spray into both nostrils daily for 3 days. 10/25/23 12/06/23  Fairy Frames, MD  furosemide  (LASIX ) 40 MG tablet TAKE 1 TABLET BY MOUTH EVERY DAY 11/01/23   Anner Alm ORN, MD  geriatric multivitamins-minerals (ELDERTONIC/GEVRABON) LIQD Take 15 mLs by mouth daily.    [provider]  HYDROcodone -acetaminophen  (HYCET) 7.5-325 mg/15 ml solution Take 10 mL up to TID as needed  for pain - not to exceed 30mL daily. 02/27/23   Izell Domino, MD  levocetirizine (XYZAL ) 5 MG tablet TAKE 1 TABLET BY MOUTH EVERY DAY IN THE EVENING 10/30/23   Hedges, Reyes, PA-C  loperamide  (IMODIUM  A-D) 2 MG tablet Take 2 mg by mouth in the morning, at noon, and at bedtime.    [provider]  LORazepam  (ATIVAN ) 0.5 MG tablet TAKE 1 TABLET (0.5 MG TOTAL) BY MOUTH EVERY 8 (EIGHT) HOURS. TAKE AS NEEDED FOR AVERSION TO FEEDINGS    [provider]  magic mouthwash (lidocaine , diphenhydrAMINE , alum & mag hydroxide) suspension Use 10 mL by  mouth 4 (four) times daily as needed. 11/23/23     magic mouthwash SOLN Take 10 mLs by mouth 4 (four) times daily. Suspension contains equal amounts of Maalox Extra Strength, nystatin, and diphenhydramine .    [provider]  Multiple Vitamin (MULTIVITAMIN PO) Take 1 tablet by mouth daily.    [provider]  ondansetron  (ZOFRAN ) 8 MG tablet Take 8 mg by mouth in the morning, at noon, and at bedtime.    [provider]  OVER THE COUNTER MEDICATION Take 30 mLs by mouth every 6 (six) hours as needed (Pain). PainQuil    [provider]  OVER THE COUNTER MEDICATION Take 2 tablets by mouth in the morning and at bedtime. [name]    [provider]  polyethylene glycol (MIRALAX  / GLYCOLAX ) 17 g packet Take 17 g by mouth daily as needed for moderate constipation.    [provider]  potassium chloride  SA (KLOR-CON  M) 20 MEQ tablet Take 1 tablet (20 mEq total) by mouth once for 1 dose. 11/10/23 12/06/23  Rana Lum CROME, NP  PREVIDENT 5000 ENAMEL PROTECT 1.1-5 % GEL Place 1 Application onto teeth 2 (two) times daily. *morning and bedtime* 11/10/23   [provider]  sacubitril -valsartan  (ENTRESTO ) 97-103 MG Take 1 tablet by mouth 2 (two) times daily. 11/29/23   Marcine Catalan M, PA-C  Sod Fluoride-Potassium Nitrate (PREVIDENT 5000 ENAMEL PROTECT DT) Place 1 application  onto teeth in the morning and at bedtime.    [provider]  solifenacin (VESICARE) 10 MG tablet Take 10 mg by mouth daily.    [provider]  tadalafil  (CIALIS ) 20 MG tablet Take 20 mg by mouth daily as needed for erectile dysfunction. 07/27/20   [provider]  tadalafil  (CIALIS ) 5 MG tablet Take 1 tablet (5 mg total) by mouth daily. 12/26/23     triamcinolone  cream (KENALOG ) 0.5 % Apply topically 2 (two) times a day. 11/27/23       Allergies: Norgesic forte [orphenadrine-aspirin -caffeine] and Latex    Review of Systems   Gastrointestinal:        Gastric reflux    Updated Vital Signs BP (!) 148/83   Pulse 86   Temp 98 F (36.7 C) (Oral)   Resp 18   Ht 5' 10 (1.778 m)   Wt 78 kg   SpO2 99%   BMI 24.68 kg/m   Physical Exam Vitals and nursing note reviewed.  Constitutional:      General: He is not in acute distress.    Appearance: Normal appearance.  HENT:     Head: Normocephalic and atraumatic.  Eyes:     Extraocular Movements: Extraocular movements intact.     Conjunctiva/sclera: Conjunctivae normal.     Pupils: Pupils are equal, round, and reactive to light.  Cardiovascular:     Rate and Rhythm: Normal rate and regular  rhythm.     Pulses: Normal pulses.  Pulmonary:     Effort: Pulmonary effort is normal. No respiratory distress.     Comments: Patient has no difficulty speaking in complete sentences. Abdominal:     General: Abdomen is flat.     Palpations: Abdomen is soft.     Tenderness: There is no abdominal tenderness.  Musculoskeletal:        General: Normal range of motion.     Cervical back: Normal range of motion.  Skin:    General: Skin is warm and dry.     Capillary Refill: Capillary refill takes less than 2 seconds.  Neurological:     General: No focal deficit present.     Mental Status: He is alert. Mental status is at baseline.  Psychiatric:        Mood and Affect: Mood normal.     (all labs ordered are listed, but only abnormal results are displayed) Labs Reviewed  BASIC METABOLIC PANEL WITH GFR - Abnormal; Notable for the following components:      Result Value   Glucose, Bld 131 (*)    BUN 24 (*)    All other components within normal limits  CBC - Abnormal; Notable for the following components:   RBC 4.03 (*)    HCT 38.3 (*)    All other components within normal limits  TROPONIN T, HIGH SENSITIVITY - Abnormal; Notable for the following components:   Troponin T High Sensitivity 21 (*)    All other components within normal limits  TROPONIN T, HIGH  SENSITIVITY    EKG: EKG Interpretation Date/Time:  Friday January 19 2024 09:33:52 EST Ventricular Rate:  73 PR Interval:  226 QRS Duration:  180 QT Interval:  442 QTC Calculation: 486 R Axis:   -64  Text Interpretation: Sinus rhythm with 1st degree A-V block Right bundle branch block Left anterior fascicular block Similar in morphology to previous EKG Confirmed by Ula Barter [phone]) on 01/19/2024 7:16:43 PM  Radiology: [facility] Chest 2 View Result Date: 01/19/2024 CLINICAL DATA:  Chest discomfort. EXAM: CHEST - 2 VIEW COMPARISON:  10/23/2023 FINDINGS: The heart size and mediastinal contours are within normal limits. Stable mild elevation of left hemidiaphragm. Both lungs are clear. The visualized skeletal structures are unremarkable. IMPRESSION: No active cardiopulmonary disease. Electronically Signed   By: Norleen DELENA Kil M.D.   On: 01/19/2024 10:55    {Document cardiac monitor, telemetry assessment procedure when appropriate:32947} Procedures   Medications Ordered in the ED  pantoprazole  (PROTONIX ) EC tablet 40 mg (40 mg Oral Given 01/19/24 2057)   {Click here for ABCD2, HEART and other calculators REFRESH Note before signing:1}                              Medical Decision Making Amount and/or Complexity of Data Reviewed Labs: ordered. Radiology: ordered.  Risk Prescription drug management.   Patient presents to the ED for: Medication refill, GERD This involves an extensive number of treatment options  Differential diagnosis includes:  GERD Cardiopulmonary etiology Metabolic etiology Co-morbid conditions: CHF, CKD, GERD, hypertension  Additional history/records obtained and reviewed: Additional history obtained from  wife External records from outside source obtained and reviewed including ***\  Clinical Course as of 01/20/24 0002  Sat Jan 20, 2024  0000 Temp: 97.6 F (36.4 C) Afebrile, vital stable, patient no acute distress [ML]  0000 CBC(!) No acute findings [ML]   0000 Basic metabolic panelROLLEN)  No acute findings [ML]  0000 DG Chest 2 View No active cardiopulmonary disease. [ML]  0001 Sinus rhythm, RBBB, comparable to previous EKG [ML]  0001 Patient given dose of Protonix  prior to discharge [ML]  0001 Patient's troponin was mildly elevated at 21, down to 19 on delta -this was reviewed with Dr. ONEIDA, patient has a history of mildly elevated troponin given ongoing history of ACS followed by cardiology.  [ML]    Clinical Course User Index [ML] Willma Duwaine CROME, [place]    Data Reviewed / Actions Taken: Labs ordered/reviewed with my independent interpretation in ED course above. Imaging ordered/reviewed with my independent interpretation in ED course above. I agree with the radiologists interpretation.  EKG ordered/reviewed with my independent interpretation in ED course above. The patient *** continuous cardiac monitoring during the ED stay. **** Key findings for the patient were reviewed with the attending physician, and ongoing clinical collaboration was maintained throughout the visit  Management / Treatments: See ED course above for medications, treatments administered, and clinical rationale.   Reevaluation of the patient after these medicines showed that the patient resolved/improved/worsened:23923::improved} I have reviewed the patients home medicines and have made adjustments as needed  Test Considered/Diagnostic tools:  Clinical decision tools such as MDCalc were used in the emergency department to support diagnostic accuracy, risk stratification, and disposition planning: *** Additional diagnostic testing was {add test 1:34123} based on the patients presenting symptoms, risk factors, and initial clinical assessment. ***  ED Course / Reassessments: Problem List:  *** presented for ***. Initial assessment included history, physical exam, and review of prior medical records. Laboratory studies, imaging, and other ancillary studies were obtained and  key results included ***. Management included ***, with ongoing reassessment. Pain and symptoms were addressed during the visit. Vital signs were obtained and monitored, and the patient remained stable throughout the stay. The patient showed ***  Patient response: *** Serial reassessments performed: {yes/no:20286}    Social determinants impacting care: {social izu:65875}  Consultations:  {consult:34125} Consult recommendations incorporated into plan: ***  Disposition: Disposition: {dispo:34126} Rationale for disposition: *** The disposition plan and rationale were discussed with the patient at the bedside, all questions were addressed, and the patient demonstrated understanding.  This note was produced using [employer]. While I have reviewed and verified all clinical information, transcription errors may remain.   {Document critical care time when appropriate  Document review of labs and clinical decision tools ie CHADS2VASC2, etc  Document your independent review of radiology images and any outside records  Document your discussion with family members, caretakers and with consultants  Document social determinants of health affecting pt's care  Document your decision making why or why not admission, treatments were needed:32947:::1}   Final diagnoses:  Gastroesophageal reflux disease, unspecified whether esophagitis present    ED Discharge Orders          Ordered    pantoprazole  (PROTONIX ) 40 MG tablet  Daily,   Status:  Discontinued        01/19/24 2005    omeprazole  (PRILOSEC) 20 MG capsule  Daily        01/19/24 2103             "

## 2024-01-19 NOTE — Discharge Instructions (Addendum)
 Thank you for visiting the Emergency Department today. It was a pleasure to be part of your healthcare team.   Your were seen today for acid reflux   As discussed, utilize medication for your acid reflux daily, rest, hydrate, and maintain a diet that is low in acidic or irritating foods.  It is important to watch for warning signs such as worsening pain, fever, trouble breathing, or chest pain. If any of these happen, return to the Emergency Department or call 911.  Follow-up with your cardiologist at your scheduled appointment and family medicine.  Thank you for trusting us  with your health.

## 2024-01-20 ENCOUNTER — Other Ambulatory Visit (HOSPITAL_COMMUNITY): Payer: Self-pay

## 2024-01-22 ENCOUNTER — Other Ambulatory Visit (HOSPITAL_COMMUNITY): Payer: Self-pay

## 2024-01-23 ENCOUNTER — Other Ambulatory Visit (HOSPITAL_COMMUNITY): Payer: Self-pay

## 2024-01-26 ENCOUNTER — Other Ambulatory Visit (HOSPITAL_COMMUNITY): Payer: Self-pay

## 2024-02-01 ENCOUNTER — Other Ambulatory Visit: Payer: Self-pay | Admitting: Emergency Medicine

## 2024-02-02 ENCOUNTER — Other Ambulatory Visit (HOSPITAL_COMMUNITY): Payer: Self-pay

## 2024-02-05 ENCOUNTER — Other Ambulatory Visit (HOSPITAL_COMMUNITY): Payer: Self-pay

## 2024-02-07 ENCOUNTER — Other Ambulatory Visit (HOSPITAL_COMMUNITY): Payer: Self-pay

## 2024-05-03 ENCOUNTER — Ambulatory Visit: Admitting: Cardiology

## 2024-05-15 ENCOUNTER — Ambulatory Visit: Admitting: Radiation Oncology

## 2024-05-17 ENCOUNTER — Ambulatory Visit: Admitting: Radiation Oncology
